# Patient Record
Sex: Female | Born: 1960
Health system: Southern US, Community
[De-identification: ages and names within clinical notes are randomized; demographics above are authoritative.]

## PROBLEM LIST (undated history)

## (undated) DIAGNOSIS — H8110 Benign paroxysmal vertigo, unspecified ear: Secondary | ICD-10-CM

## (undated) DIAGNOSIS — R0789 Other chest pain: Secondary | ICD-10-CM

## (undated) DIAGNOSIS — M7660 Achilles tendinitis, unspecified leg: Secondary | ICD-10-CM

## (undated) DIAGNOSIS — E785 Hyperlipidemia, unspecified: Secondary | ICD-10-CM

## (undated) DIAGNOSIS — Z0000 Encounter for general adult medical examination without abnormal findings: Principal | ICD-10-CM

## (undated) DIAGNOSIS — K219 Gastro-esophageal reflux disease without esophagitis: Secondary | ICD-10-CM

## (undated) DIAGNOSIS — M79671 Pain in right foot: Secondary | ICD-10-CM

## (undated) DIAGNOSIS — M79642 Pain in left hand: Secondary | ICD-10-CM

## (undated) DIAGNOSIS — B351 Tinea unguium: Principal | ICD-10-CM

## (undated) DIAGNOSIS — J019 Acute sinusitis, unspecified: Secondary | ICD-10-CM

## (undated) DIAGNOSIS — R3915 Urgency of urination: Secondary | ICD-10-CM

## (undated) DIAGNOSIS — M792 Neuralgia and neuritis, unspecified: Secondary | ICD-10-CM

## (undated) DIAGNOSIS — G540 Brachial plexus disorders: Secondary | ICD-10-CM

## (undated) DIAGNOSIS — I10 Essential (primary) hypertension: Secondary | ICD-10-CM

## (undated) HISTORY — DX: Other chest pain: R07.89

## (undated) HISTORY — DX: Encounter for general adult medical examination without abnormal findings: Z00.00

## (undated) HISTORY — DX: Essential (primary) hypertension: I10

## (undated) HISTORY — DX: Brachial plexus disorders: G54.0

## (undated) HISTORY — DX: Pain in right foot: M79.671

## (undated) HISTORY — PX: DILATION AND CURETTAGE OF UTERUS: SHX78

## (undated) HISTORY — PX: ABDOMINAL HYSTERECTOMY: SHX81

## (undated) HISTORY — DX: Pain in left hand: M79.642

## (undated) HISTORY — DX: Tinea unguium: B35.1

## (undated) HISTORY — DX: Neuralgia and neuritis, unspecified: M79.2

## (undated) HISTORY — DX: Achilles tendinitis, unspecified leg: M76.60

## (undated) HISTORY — DX: Hyperlipidemia, unspecified: E78.5

## (undated) HISTORY — DX: Acute sinusitis, unspecified: J01.90

## (undated) HISTORY — DX: Benign paroxysmal vertigo, unspecified ear: H81.10

## (undated) HISTORY — DX: Urgency of urination: R39.15

---

## 1998-02-05 ENCOUNTER — Other Ambulatory Visit: Admission: RE | Admit: 1998-02-05 | Discharge: 1998-02-05 | Payer: Self-pay | Admitting: Obstetrics

## 1998-02-05 ENCOUNTER — Encounter: Admission: RE | Admit: 1998-02-05 | Discharge: 1998-02-05 | Payer: Self-pay | Admitting: Obstetrics

## 1998-04-29 ENCOUNTER — Encounter: Admission: RE | Admit: 1998-04-29 | Discharge: 1998-04-29 | Payer: Self-pay | Admitting: Family Medicine

## 1998-05-13 ENCOUNTER — Encounter: Admission: RE | Admit: 1998-05-13 | Discharge: 1998-05-13 | Payer: Self-pay | Admitting: Family Medicine

## 1998-06-02 ENCOUNTER — Encounter: Admission: RE | Admit: 1998-06-02 | Discharge: 1998-06-02 | Payer: Self-pay | Admitting: Family Medicine

## 1998-06-05 ENCOUNTER — Encounter: Admission: RE | Admit: 1998-06-05 | Discharge: 1998-06-05 | Payer: Self-pay | Admitting: Family Medicine

## 1998-06-26 ENCOUNTER — Encounter: Admission: RE | Admit: 1998-06-26 | Discharge: 1998-06-26 | Payer: Self-pay | Admitting: Family Medicine

## 1998-07-13 ENCOUNTER — Encounter: Admission: RE | Admit: 1998-07-13 | Discharge: 1998-07-13 | Payer: Self-pay | Admitting: Family Medicine

## 1998-07-16 ENCOUNTER — Encounter: Admission: RE | Admit: 1998-07-16 | Discharge: 1998-07-16 | Payer: Self-pay | Admitting: Family Medicine

## 1998-07-20 ENCOUNTER — Encounter: Admission: RE | Admit: 1998-07-20 | Discharge: 1998-07-20 | Payer: Self-pay | Admitting: Sports Medicine

## 1999-02-09 ENCOUNTER — Encounter: Admission: RE | Admit: 1999-02-09 | Discharge: 1999-02-09 | Payer: Self-pay | Admitting: Sports Medicine

## 1999-02-19 ENCOUNTER — Encounter: Admission: RE | Admit: 1999-02-19 | Discharge: 1999-02-19 | Payer: Self-pay | Admitting: Family Medicine

## 1999-03-09 ENCOUNTER — Emergency Department (HOSPITAL_COMMUNITY): Admission: EM | Admit: 1999-03-09 | Discharge: 1999-03-09 | Payer: Self-pay | Admitting: Emergency Medicine

## 2000-10-13 ENCOUNTER — Encounter: Payer: Self-pay | Admitting: *Deleted

## 2000-10-13 ENCOUNTER — Emergency Department (HOSPITAL_COMMUNITY): Admission: EM | Admit: 2000-10-13 | Discharge: 2000-10-13 | Payer: Self-pay | Admitting: Emergency Medicine

## 2001-03-26 ENCOUNTER — Encounter: Admission: RE | Admit: 2001-03-26 | Discharge: 2001-03-26 | Payer: Self-pay | Admitting: Family Medicine

## 2001-09-05 ENCOUNTER — Encounter: Admission: RE | Admit: 2001-09-05 | Discharge: 2001-09-05 | Payer: Self-pay | Admitting: Family Medicine

## 2002-09-30 ENCOUNTER — Encounter: Admission: RE | Admit: 2002-09-30 | Discharge: 2002-09-30 | Payer: Self-pay | Admitting: Sports Medicine

## 2002-10-09 ENCOUNTER — Encounter: Admission: RE | Admit: 2002-10-09 | Discharge: 2002-10-09 | Payer: Self-pay | Admitting: Family Medicine

## 2003-01-07 ENCOUNTER — Encounter: Payer: Self-pay | Admitting: Occupational Medicine

## 2003-01-07 ENCOUNTER — Encounter: Admission: RE | Admit: 2003-01-07 | Discharge: 2003-01-07 | Payer: Self-pay | Admitting: Occupational Medicine

## 2003-11-18 ENCOUNTER — Encounter (INDEPENDENT_AMBULATORY_CARE_PROVIDER_SITE_OTHER): Payer: Self-pay | Admitting: *Deleted

## 2003-11-18 LAB — CONVERTED CEMR LAB

## 2003-11-27 ENCOUNTER — Encounter: Admission: RE | Admit: 2003-11-27 | Discharge: 2003-11-27 | Payer: Self-pay | Admitting: Family Medicine

## 2003-12-10 ENCOUNTER — Encounter: Admission: RE | Admit: 2003-12-10 | Discharge: 2003-12-10 | Payer: Self-pay | Admitting: Sports Medicine

## 2004-01-20 ENCOUNTER — Encounter: Admission: RE | Admit: 2004-01-20 | Discharge: 2004-01-20 | Payer: Self-pay | Admitting: Sports Medicine

## 2004-02-05 ENCOUNTER — Encounter: Admission: RE | Admit: 2004-02-05 | Discharge: 2004-02-05 | Payer: Self-pay | Admitting: Family Medicine

## 2004-02-19 ENCOUNTER — Encounter: Admission: RE | Admit: 2004-02-19 | Discharge: 2004-02-19 | Payer: Self-pay | Admitting: Family Medicine

## 2004-02-25 ENCOUNTER — Encounter: Admission: RE | Admit: 2004-02-25 | Discharge: 2004-02-25 | Payer: Self-pay | Admitting: Sports Medicine

## 2004-02-25 ENCOUNTER — Encounter: Admission: RE | Admit: 2004-02-25 | Discharge: 2004-02-25 | Payer: Self-pay | Admitting: Family Medicine

## 2004-03-26 ENCOUNTER — Encounter: Admission: RE | Admit: 2004-03-26 | Discharge: 2004-03-26 | Payer: Self-pay | Admitting: Family Medicine

## 2004-06-06 ENCOUNTER — Emergency Department (HOSPITAL_COMMUNITY): Admission: EM | Admit: 2004-06-06 | Discharge: 2004-06-06 | Payer: Self-pay | Admitting: Emergency Medicine

## 2004-07-01 ENCOUNTER — Encounter: Admission: RE | Admit: 2004-07-01 | Discharge: 2004-07-14 | Payer: Self-pay | Admitting: Occupational Medicine

## 2004-12-22 ENCOUNTER — Encounter: Admission: RE | Admit: 2004-12-22 | Discharge: 2004-12-22 | Payer: Self-pay | Admitting: Obstetrics and Gynecology

## 2005-02-04 ENCOUNTER — Encounter (INDEPENDENT_AMBULATORY_CARE_PROVIDER_SITE_OTHER): Payer: Self-pay | Admitting: Specialist

## 2005-02-04 ENCOUNTER — Ambulatory Visit (HOSPITAL_COMMUNITY): Admission: RE | Admit: 2005-02-04 | Discharge: 2005-02-04 | Payer: Self-pay | Admitting: Obstetrics and Gynecology

## 2006-06-19 ENCOUNTER — Emergency Department (HOSPITAL_COMMUNITY): Admission: EM | Admit: 2006-06-19 | Discharge: 2006-06-19 | Payer: Self-pay | Admitting: Family Medicine

## 2006-10-26 ENCOUNTER — Ambulatory Visit: Payer: Self-pay | Admitting: Cardiology

## 2006-10-26 ENCOUNTER — Inpatient Hospital Stay (HOSPITAL_COMMUNITY): Admission: EM | Admit: 2006-10-26 | Discharge: 2006-10-26 | Payer: Self-pay | Admitting: Family Medicine

## 2006-10-30 ENCOUNTER — Ambulatory Visit: Payer: Self-pay

## 2006-11-16 ENCOUNTER — Ambulatory Visit: Payer: Self-pay | Admitting: Family Medicine

## 2006-11-16 DIAGNOSIS — M79609 Pain in unspecified limb: Secondary | ICD-10-CM | POA: Insufficient documentation

## 2006-11-16 DIAGNOSIS — F17200 Nicotine dependence, unspecified, uncomplicated: Secondary | ICD-10-CM | POA: Insufficient documentation

## 2006-11-16 DIAGNOSIS — N938 Other specified abnormal uterine and vaginal bleeding: Secondary | ICD-10-CM | POA: Insufficient documentation

## 2006-11-16 DIAGNOSIS — E669 Obesity, unspecified: Secondary | ICD-10-CM | POA: Insufficient documentation

## 2006-11-16 DIAGNOSIS — N949 Unspecified condition associated with female genital organs and menstrual cycle: Secondary | ICD-10-CM

## 2006-11-16 DIAGNOSIS — R12 Heartburn: Secondary | ICD-10-CM | POA: Insufficient documentation

## 2006-11-17 ENCOUNTER — Encounter (INDEPENDENT_AMBULATORY_CARE_PROVIDER_SITE_OTHER): Payer: Self-pay | Admitting: *Deleted

## 2006-12-26 ENCOUNTER — Ambulatory Visit: Payer: Self-pay | Admitting: Internal Medicine

## 2006-12-29 ENCOUNTER — Ambulatory Visit: Payer: Self-pay | Admitting: Internal Medicine

## 2006-12-29 LAB — CONVERTED CEMR LAB
ALT: 19 units/L (ref 0–40)
AST: 19 units/L (ref 0–37)
Albumin: 3.4 g/dL — ABNORMAL LOW (ref 3.5–5.2)
Alkaline Phosphatase: 69 units/L (ref 39–117)
Bilirubin, Direct: 0.1 mg/dL (ref 0.0–0.3)
Cholesterol: 111 mg/dL (ref 0–200)
HDL: 31.3 mg/dL — ABNORMAL LOW (ref >39.0)
LDL Cholesterol: 63 mg/dL (ref 0–99)
Sed Rate: 20 mm/hr (ref 0–25)
Total Bilirubin: 0.7 mg/dL (ref 0.3–1.2)
Total CHOL/HDL Ratio: 3.5
Total Protein: 6.7 g/dL (ref 6.0–8.3)
Triglycerides: 85 mg/dL (ref 0–149)
VLDL: 17 mg/dL (ref 0–40)

## 2007-01-30 ENCOUNTER — Ambulatory Visit: Payer: Self-pay | Admitting: Internal Medicine

## 2007-02-01 ENCOUNTER — Ambulatory Visit (HOSPITAL_BASED_OUTPATIENT_CLINIC_OR_DEPARTMENT_OTHER): Admission: RE | Admit: 2007-02-01 | Discharge: 2007-02-01 | Payer: Self-pay | Admitting: Internal Medicine

## 2007-02-06 ENCOUNTER — Ambulatory Visit: Payer: Self-pay | Admitting: Pulmonary Disease

## 2007-02-09 ENCOUNTER — Ambulatory Visit (HOSPITAL_COMMUNITY): Admission: RE | Admit: 2007-02-09 | Discharge: 2007-02-09 | Payer: Self-pay | Admitting: Internal Medicine

## 2007-03-12 ENCOUNTER — Ambulatory Visit: Payer: Self-pay | Admitting: Internal Medicine

## 2007-06-12 ENCOUNTER — Ambulatory Visit: Payer: Self-pay | Admitting: Internal Medicine

## 2008-01-30 ENCOUNTER — Ambulatory Visit: Payer: Self-pay | Admitting: Internal Medicine

## 2008-01-30 DIAGNOSIS — G44209 Tension-type headache, unspecified, not intractable: Secondary | ICD-10-CM | POA: Insufficient documentation

## 2008-04-22 LAB — CONVERTED CEMR LAB: Pap Smear: NORMAL

## 2008-05-08 ENCOUNTER — Ambulatory Visit: Payer: Self-pay | Admitting: Internal Medicine

## 2008-05-08 DIAGNOSIS — I10 Essential (primary) hypertension: Secondary | ICD-10-CM | POA: Insufficient documentation

## 2008-05-08 DIAGNOSIS — R609 Edema, unspecified: Secondary | ICD-10-CM | POA: Insufficient documentation

## 2008-05-27 ENCOUNTER — Ambulatory Visit: Payer: Self-pay | Admitting: Internal Medicine

## 2008-05-27 LAB — CONVERTED CEMR LAB
ALT: 17 units/L (ref 0–35)
AST: 18 units/L (ref 0–37)
BUN: 12 mg/dL (ref 6–23)
CO2: 31 meq/L (ref 19–32)
Calcium: 8.5 mg/dL (ref 8.4–10.5)
Chloride: 104 meq/L (ref 96–112)
Cholesterol: 135 mg/dL (ref 0–200)
Creatinine, Ser: 0.7 mg/dL (ref 0.4–1.2)
GFR calc Af Amer: 116 mL/min
GFR calc non Af Amer: 96 mL/min
Glucose, Bld: 132 mg/dL — ABNORMAL HIGH (ref 70–99)
HDL: 26 mg/dL — ABNORMAL LOW (ref >39.0)
Hgb A1c MFr Bld: 6.5 % — ABNORMAL HIGH (ref 4.6–6.0)
LDL Cholesterol: 91 mg/dL (ref 0–99)
Potassium: 3.9 meq/L (ref 3.5–5.1)
Sodium: 141 meq/L (ref 135–145)
TSH: 2.58 microintl units/mL (ref 0.35–5.50)
Total CHOL/HDL Ratio: 5.2
Triglycerides: 88 mg/dL (ref 0–149)
VLDL: 18 mg/dL (ref 0–40)

## 2008-06-19 ENCOUNTER — Ambulatory Visit: Payer: Self-pay | Admitting: Internal Medicine

## 2008-06-19 DIAGNOSIS — E1169 Type 2 diabetes mellitus with other specified complication: Secondary | ICD-10-CM | POA: Insufficient documentation

## 2008-06-19 DIAGNOSIS — E669 Obesity, unspecified: Secondary | ICD-10-CM | POA: Insufficient documentation

## 2008-07-09 ENCOUNTER — Inpatient Hospital Stay (HOSPITAL_COMMUNITY): Admission: RE | Admit: 2008-07-09 | Discharge: 2008-07-11 | Payer: Self-pay | Admitting: Obstetrics and Gynecology

## 2008-07-09 ENCOUNTER — Encounter (INDEPENDENT_AMBULATORY_CARE_PROVIDER_SITE_OTHER): Payer: Self-pay | Admitting: Obstetrics and Gynecology

## 2008-07-11 ENCOUNTER — Encounter: Payer: Self-pay | Admitting: Internal Medicine

## 2008-07-14 ENCOUNTER — Inpatient Hospital Stay (HOSPITAL_COMMUNITY): Admission: AD | Admit: 2008-07-14 | Discharge: 2008-07-14 | Payer: Self-pay | Admitting: Obstetrics and Gynecology

## 2008-07-15 ENCOUNTER — Ambulatory Visit (HOSPITAL_COMMUNITY): Admission: RE | Admit: 2008-07-15 | Discharge: 2008-07-15 | Payer: Self-pay | Admitting: Obstetrics and Gynecology

## 2008-09-24 ENCOUNTER — Encounter: Payer: Self-pay | Admitting: Internal Medicine

## 2008-11-06 ENCOUNTER — Encounter: Payer: Self-pay | Admitting: Internal Medicine

## 2008-11-06 LAB — HM DIABETES EYE EXAM: HM Diabetic Eye Exam: NORMAL

## 2008-11-20 ENCOUNTER — Ambulatory Visit (HOSPITAL_BASED_OUTPATIENT_CLINIC_OR_DEPARTMENT_OTHER): Admission: RE | Admit: 2008-11-20 | Discharge: 2008-11-20 | Payer: Self-pay | Admitting: Internal Medicine

## 2008-11-20 ENCOUNTER — Ambulatory Visit: Payer: Self-pay | Admitting: Internal Medicine

## 2008-11-20 ENCOUNTER — Ambulatory Visit: Payer: Self-pay | Admitting: Diagnostic Radiology

## 2008-11-20 DIAGNOSIS — R0789 Other chest pain: Secondary | ICD-10-CM | POA: Insufficient documentation

## 2008-12-15 ENCOUNTER — Ambulatory Visit: Payer: Self-pay | Admitting: Internal Medicine

## 2008-12-15 LAB — CONVERTED CEMR LAB
ALT: 18 units/L (ref 0–35)
AST: 18 units/L (ref 0–37)
Albumin: 3.6 g/dL (ref 3.5–5.2)
Alkaline Phosphatase: 75 units/L (ref 39–117)
BUN: 15 mg/dL (ref 6–23)
Bilirubin, Direct: 0.1 mg/dL (ref 0.0–0.3)
CO2: 31 meq/L (ref 19–32)
Calcium: 9.1 mg/dL (ref 8.4–10.5)
Chloride: 107 meq/L (ref 96–112)
Cholesterol: 142 mg/dL (ref 0–200)
Creatinine, Ser: 0.7 mg/dL (ref 0.4–1.2)
Creatinine,U: 115.9 mg/dL
GFR calc non Af Amer: 115.79 mL/min (ref >60)
Glucose, Bld: 130 mg/dL — ABNORMAL HIGH (ref 70–99)
HDL: 30.6 mg/dL — ABNORMAL LOW (ref >39.00)
Hgb A1c MFr Bld: 6.7 % — ABNORMAL HIGH (ref 4.6–6.5)
LDL Cholesterol: 93 mg/dL (ref 0–99)
Microalb Creat Ratio: 2.6 mg/g (ref 0.0–30.0)
Microalb, Ur: 0.3 mg/dL (ref 0.0–1.9)
Potassium: 4.5 meq/L (ref 3.5–5.1)
Sodium: 142 meq/L (ref 135–145)
Total Bilirubin: 0.5 mg/dL (ref 0.3–1.2)
Total CHOL/HDL Ratio: 5
Total Protein: 7.2 g/dL (ref 6.0–8.3)
Triglycerides: 94 mg/dL (ref 0.0–149.0)
VLDL: 18.8 mg/dL (ref 0.0–40.0)

## 2008-12-22 ENCOUNTER — Ambulatory Visit: Payer: Self-pay | Admitting: Internal Medicine

## 2008-12-23 DIAGNOSIS — E782 Mixed hyperlipidemia: Secondary | ICD-10-CM | POA: Insufficient documentation

## 2008-12-24 ENCOUNTER — Encounter: Payer: Self-pay | Admitting: Cardiology

## 2008-12-24 ENCOUNTER — Ambulatory Visit: Payer: Self-pay | Admitting: Cardiology

## 2009-02-20 ENCOUNTER — Encounter: Payer: Self-pay | Admitting: Cardiology

## 2009-02-20 ENCOUNTER — Ambulatory Visit: Payer: Self-pay

## 2009-03-19 ENCOUNTER — Ambulatory Visit: Payer: Self-pay | Admitting: Internal Medicine

## 2009-03-19 LAB — CONVERTED CEMR LAB
BUN: 12 mg/dL (ref 6–23)
CO2: 31 meq/L (ref 19–32)
Calcium: 9 mg/dL (ref 8.4–10.5)
Chloride: 105 meq/L (ref 96–112)
Creatinine, Ser: 0.7 mg/dL (ref 0.4–1.2)
GFR calc non Af Amer: 115.66 mL/min (ref >60)
Glucose, Bld: 107 mg/dL — ABNORMAL HIGH (ref 70–99)
Hgb A1c MFr Bld: 6.3 % (ref 4.6–6.5)
Potassium: 4.3 meq/L (ref 3.5–5.1)
Sodium: 140 meq/L (ref 135–145)

## 2009-03-26 ENCOUNTER — Ambulatory Visit: Payer: Self-pay | Admitting: Internal Medicine

## 2009-05-26 ENCOUNTER — Ambulatory Visit: Payer: Self-pay | Admitting: Internal Medicine

## 2009-05-26 LAB — CONVERTED CEMR LAB
BUN: 13 mg/dL (ref 6–23)
CO2: 29 meq/L (ref 19–32)
Calcium: 8.7 mg/dL (ref 8.4–10.5)
Chloride: 107 meq/L (ref 96–112)
Creatinine, Ser: 0.8 mg/dL (ref 0.4–1.2)
GFR calc non Af Amer: 99.06 mL/min (ref >60)
Glucose, Bld: 115 mg/dL — ABNORMAL HIGH (ref 70–99)
Hgb A1c MFr Bld: 6.6 % — ABNORMAL HIGH (ref 4.6–6.5)
Potassium: 4.1 meq/L (ref 3.5–5.1)
Sodium: 140 meq/L (ref 135–145)

## 2009-06-04 ENCOUNTER — Ambulatory Visit: Payer: Self-pay | Admitting: Internal Medicine

## 2009-06-22 ENCOUNTER — Telehealth: Payer: Self-pay | Admitting: Internal Medicine

## 2009-08-04 ENCOUNTER — Telehealth: Payer: Self-pay | Admitting: Internal Medicine

## 2009-09-28 ENCOUNTER — Ambulatory Visit: Payer: Self-pay | Admitting: Internal Medicine

## 2009-09-28 LAB — CONVERTED CEMR LAB
BUN: 10 mg/dL (ref 6–23)
CO2: 30 meq/L (ref 19–32)
Calcium: 8.8 mg/dL (ref 8.4–10.5)
Chloride: 105 meq/L (ref 96–112)
Creatinine, Ser: 0.8 mg/dL (ref 0.4–1.2)
GFR calc non Af Amer: 98.92 mL/min (ref >60)
Glucose, Bld: 96 mg/dL (ref 70–99)
Hgb A1c MFr Bld: 6.3 % (ref 4.6–6.5)
Potassium: 4.2 meq/L (ref 3.5–5.1)
Sodium: 141 meq/L (ref 135–145)

## 2009-10-22 ENCOUNTER — Telehealth: Payer: Self-pay | Admitting: Internal Medicine

## 2010-01-14 ENCOUNTER — Ambulatory Visit: Payer: Self-pay | Admitting: Internal Medicine

## 2010-01-14 LAB — CONVERTED CEMR LAB
BUN: 13 mg/dL (ref 6–23)
CO2: 29 meq/L (ref 19–32)
Calcium: 9.2 mg/dL (ref 8.4–10.5)
Chloride: 104 meq/L (ref 96–112)
Creatinine, Ser: 0.87 mg/dL (ref 0.40–1.20)
Creatinine, Urine: 62.6 mg/dL
Glucose, Bld: 100 mg/dL — ABNORMAL HIGH (ref 70–99)
Hgb A1c MFr Bld: 6.3 % — ABNORMAL HIGH (ref <5.7)
Microalb Creat Ratio: 8 mg/g (ref 0.0–30.0)
Microalb, Ur: 0.5 mg/dL (ref 0.00–1.89)
Potassium: 4.5 meq/L (ref 3.5–5.3)
Sodium: 140 meq/L (ref 135–145)

## 2010-01-15 ENCOUNTER — Encounter: Payer: Self-pay | Admitting: Internal Medicine

## 2010-01-22 ENCOUNTER — Telehealth: Payer: Self-pay | Admitting: Internal Medicine

## 2010-02-23 LAB — HM MAMMOGRAPHY: HM Mammogram: NORMAL

## 2010-03-10 ENCOUNTER — Ambulatory Visit: Payer: Self-pay | Admitting: Internal Medicine

## 2010-03-10 DIAGNOSIS — R252 Cramp and spasm: Secondary | ICD-10-CM | POA: Insufficient documentation

## 2010-03-10 LAB — HM DIABETES FOOT EXAM

## 2010-03-26 ENCOUNTER — Telehealth: Payer: Self-pay | Admitting: Internal Medicine

## 2010-06-02 ENCOUNTER — Ambulatory Visit: Payer: Self-pay | Admitting: Internal Medicine

## 2010-06-02 LAB — CONVERTED CEMR LAB
BUN: 16 mg/dL (ref 6–23)
CO2: 28 meq/L (ref 19–32)
Calcium: 9.6 mg/dL (ref 8.4–10.5)
Chloride: 108 meq/L (ref 96–112)
Creatinine, Ser: 0.8 mg/dL (ref 0.4–1.2)
GFR calc non Af Amer: 93.23 mL/min (ref >60)
Glucose, Bld: 142 mg/dL — ABNORMAL HIGH (ref 70–99)
Hgb A1c MFr Bld: 8.1 % — ABNORMAL HIGH (ref 4.6–6.5)
Potassium: 4.6 meq/L (ref 3.5–5.1)
Sodium: 142 meq/L (ref 135–145)
Total CK: 232 units/L — ABNORMAL HIGH (ref 7–177)

## 2010-06-09 ENCOUNTER — Ambulatory Visit: Payer: Self-pay | Admitting: Internal Medicine

## 2010-07-05 ENCOUNTER — Encounter: Payer: Self-pay | Admitting: Internal Medicine

## 2010-07-07 ENCOUNTER — Telehealth: Payer: Self-pay | Admitting: Internal Medicine

## 2010-07-09 ENCOUNTER — Emergency Department (HOSPITAL_COMMUNITY): Admission: EM | Admit: 2010-07-09 | Discharge: 2010-07-09 | Payer: Self-pay | Admitting: Family Medicine

## 2010-07-14 ENCOUNTER — Encounter: Payer: Self-pay | Admitting: Internal Medicine

## 2010-07-15 ENCOUNTER — Telehealth: Payer: Self-pay | Admitting: Internal Medicine

## 2010-07-23 ENCOUNTER — Encounter: Payer: Self-pay | Admitting: Internal Medicine

## 2010-09-01 ENCOUNTER — Ambulatory Visit: Payer: Self-pay | Admitting: Internal Medicine

## 2010-09-06 ENCOUNTER — Encounter: Payer: Self-pay | Admitting: Internal Medicine

## 2010-09-21 ENCOUNTER — Encounter: Payer: Self-pay | Admitting: Internal Medicine

## 2010-09-21 ENCOUNTER — Ambulatory Visit (HOSPITAL_BASED_OUTPATIENT_CLINIC_OR_DEPARTMENT_OTHER)
Admission: RE | Admit: 2010-09-21 | Discharge: 2010-09-21 | Payer: Self-pay | Source: Home / Self Care | Attending: Internal Medicine | Admitting: Internal Medicine

## 2010-09-21 DIAGNOSIS — R062 Wheezing: Secondary | ICD-10-CM | POA: Insufficient documentation

## 2010-09-21 DIAGNOSIS — R0989 Other specified symptoms and signs involving the circulatory and respiratory systems: Secondary | ICD-10-CM | POA: Insufficient documentation

## 2010-09-21 DIAGNOSIS — R0609 Other forms of dyspnea: Secondary | ICD-10-CM | POA: Insufficient documentation

## 2010-09-21 LAB — CONVERTED CEMR LAB
BUN: 12 mg/dL (ref 6–23)
CO2: 22 meq/L (ref 19–32)
Calcium: 9.3 mg/dL (ref 8.4–10.5)
Chloride: 105 meq/L (ref 96–112)
Creatinine, Ser: 0.74 mg/dL (ref 0.40–1.20)
Glucose, Bld: 112 mg/dL — ABNORMAL HIGH (ref 70–99)
HCT: 43 % (ref 36.0–46.0)
Hemoglobin: 14.2 g/dL (ref 12.0–15.0)
Hgb A1c MFr Bld: 7 % — ABNORMAL HIGH (ref <5.7)
MCHC: 33 g/dL (ref 30.0–36.0)
MCV: 91.3 fL (ref 78.0–100.0)
Platelets: 391 10*3/uL (ref 150–400)
Potassium: 4.6 meq/L (ref 3.5–5.3)
RBC: 4.71 M/uL (ref 3.87–5.11)
RDW: 14 % (ref 11.5–15.5)
Sodium: 142 meq/L (ref 135–145)
WBC: 11.2 10*3/uL — ABNORMAL HIGH (ref 4.0–10.5)

## 2010-09-22 ENCOUNTER — Telehealth: Payer: Self-pay | Admitting: Internal Medicine

## 2010-10-19 NOTE — Assessment & Plan Note (Signed)
Summary: 1 month follow-up//fd   Vital Signs:  Patient profile:   50 year old female Weight:      237.50 pounds BMI:     46.55 Temp:     98.2 degrees F oral Pulse rate:   68 / minute Pulse rhythm:   regular Resp:     16 per minute BP sitting:   116 / 72  (right arm) Cuff size:   large  Vitals Entered By: Glendell Docker CMA (December 22, 2008 1:20 PM)  Primary Care Provider:  Dondra Spry DO  CC:  Type 2 diabetes mellitus follow-up.  History of Present Illness: follow up disease management  Type 2 Diabetes Mellitus Follow-Up      This is a 50 year old woman who presents for Type 2 diabetes mellitus follow-up.  The patient denies polyuria, polydipsia, weight loss, and weight gain.  The patient denies the following symptoms: chest pain.  Since the last visit the patient reports good dietary compliance, compliance with medications, and not exercising regularly.  Since the last visit, the patient reports having had eye care by an ophthalmologist.    Htn - stable.  Tobacco abuse - she quit smoking.  Preventive Screening-Counseling & Management     Smoking Status: quit < 6 months  Allergies: No Known Drug Allergies  Past History:  Past Medical History:    Depression    Headaches - neg MRI of Brain 01/2007    Mild sleep apnea      DM II    Hypertension   Family History:    both have GERD, Mother has HTN, father has `heart problems`. No h/o cancer. 2 brothers and 1 sister A&W.     Social History:    Pt works at [employer] in [place] [employer].      No etoh, no drugs.    Quit smoking     Does have h/o marijuana use in past.     No regular exercise. G0P0       Smoking Status:  quit < 6 months  Review of Systems  The patient denies weight loss, weight gain, and chest pain.    Physical Exam  General:  alert, well-developed, and well-nourished.   Neck:  supple, no masses, and no carotid bruits.   Lungs:  normal respiratory effort and normal breath sounds.   Heart:   normal rate, regular rhythm, and no gallop.   Extremities:  trace left pedal edema and 1+ right pedal edema.   Neurologic:  cranial nerves II-XII intact and gait normal.    Diabetes Management Exam:    Foot Exam (with socks and/or shoes not present):       Inspection:          Left foot: normal          Right foot: normal    Eye Exam:       Eye Exam done elsewhere          Date: 11/06/2008          Results: normal          Done by: Dr. Hanley Seamen   Impression & Recommendations:  Problem # 1:  DIABETES MELLITUS, TYPE II (ICD-250.00) A1c worse.  Add Byetta.   Her updated medication list for this problem includes:    Metformin Hcl 750 Mg Xr24h-tab (Metformin hcl) ..... One by mouth bid    Aspirin 81 Mg Tabs (Aspirin) .Marland Kitchen... Take 1 tab once daily    Byetta  5 Mcg Pen 5 Mcg/0.103ml Soln (Exenatide) .Marland KitchenMarland KitchenMarland KitchenMarland Kitchen 5 micrograms subcutaneously two times a day  Labs Reviewed: Creat: 0.7 (12/15/2008)    Reviewed HgBA1c results: 6.7 (12/15/2008)  6.5 (05/27/2008)  Problem # 2:  HYPERTENSION (ICD-401.9) Stable.  Maintain current medication regimen.  Her updated medication list for this problem includes:    Metoprolol Succinate 50 Mg Xr24h-tab (Metoprolol succinate) ..... One by mouth once daily    Hydrochlorothiazide 25 Mg Tabs (Hydrochlorothiazide) ..... One by mouth once daily  BP today: 116/72 Prior BP: 130/86 (11/20/2008)  Labs Reviewed: K+: 4.5 (12/15/2008) Creat: : 0.7 (12/15/2008)   Chol: 142 (12/15/2008)   HDL: 30.60 (12/15/2008)   LDL: 93 (12/15/2008)   TG: 94.0 (12/15/2008)  Problem # 3:  TOBACCO DEPENDENCE (ICD-305.1) Assessment: Improved Pt reports quiting smoking.  Her updated medication list for this problem includes:    Eq Nicotine 14 Mg/24hr Pt24 (Nicotine) .Marland Kitchen... Apply qd    Eq Nicotine 7 Mg/24hr Pt24 (Nicotine) .Marland Kitchen... Apply qd    Commit 2 Mg Lozg (Nicotine polacrilex) ..... Use qid as needed  Complete Medication List: 1)  Metoprolol Succinate 50 Mg Xr24h-tab (Metoprolol  succinate) .... One by mouth once daily 2)  Simvastatin 40 Mg Tabs (Simvastatin) .... Take 1 tablet by mouth once a day 3)  Hydrochlorothiazide 25 Mg Tabs (Hydrochlorothiazide) .... One by mouth once daily 4)  Omeprazole 20 Mg Tbec (Omeprazole) .... Take 1 tablet by mouth two times a day 5)  Metformin Hcl 750 Mg Xr24h-tab (Metformin hcl) .... One by mouth bid 6)  Aspirin 81 Mg Tabs (Aspirin) .... Take 1 tab once daily 7)  Eq Nicotine 14 Mg/24hr Pt24 (Nicotine) .... Apply qd 8)  Eq Nicotine 7 Mg/24hr Pt24 (Nicotine) .... Apply qd 9)  Commit 2 Mg Lozg (Nicotine polacrilex) .... Use qid as needed 10)  Byetta 5 Mcg Pen 5 Mcg/0.74ml Soln (Exenatide) .... 5 micrograms subcutaneously two times a day 11)  Bd U/f Short Pen Needle 31g X 8 Mm Misc (Insulin pen needle) .... Use two times a day as directed  Patient Instructions: 1)  Please schedule a follow-up appointment in 3 months. 2)  BMP prior to visit, ICD-9: 250.00 3)  HbgA1C prior to visit, ICD-9: 250.00 4)  Please return for lab work one (1) week before your next appointment.  Prescriptions: BD U/F SHORT PEN NEEDLE 31G X 8 MM MISC (INSULIN PEN NEEDLE) use two times a day as directed  #100 x 3   Entered and Authorized by:   D. Thomos Lemons DO   Signed by:   D. Thomos Lemons DO on 12/22/2008   Method used:   Electronically to        [facility] (retail)       [address]       [place], [place]  [address]       Ph: [phone]       Fax: [phone]   RxID:   [id number] BYETTA 5 MCG PEN 5 MCG/0.02ML SOLN (EXENATIDE) 5 micrograms Subcutaneously two times a day  #1 month x 2   Entered and Authorized by:   D. Thomos Lemons DO   Signed by:   D. Thomos Lemons DO on 12/22/2008   Method used:   Electronically to        [facility] (retail)       [address]       [place]  [place], [place]  [address]       Ph: [phone]       Fax: [phone]   RxID:   [id number]

## 2010-10-19 NOTE — Letter (Signed)
   [place] at [facility] [address] [place], [place]  [address]  [place] Phone: [phone]      January 15, 2010   [facility] [address] [place], [place] [address]  RE:  LAB RESULTS  Dear  Ms. [place],  The following is an interpretation of your most recent lab tests.  Please take note of any instructions provided or changes to medications that have resulted from your lab work.  ELECTROLYTES:  Good - no changes needed  KIDNEY FUNCTION TESTS:  Good - no changes needed    DIABETIC STUDIES:  Good - no changes needed Blood Glucose: 100   HgbA1C: 6.3   Microalbumin/Creatinine Ratio: 8.0          Sincerely Yours,    Dr. Thomos Lemons

## 2010-10-19 NOTE — Assessment & Plan Note (Signed)
Summary: 2 month follow up/mhf   Vital Signs:  Patient profile:   50 year old female Weight:      243.25 pounds BMI:     41.90 O2 Sat:      98 % on Room air Temp:     97.9 degrees F oral Pulse rate:   72 / minute Pulse rhythm:   regular Resp:     16 per minute BP sitting:   110 / 70  (right arm) Cuff size:   large  Vitals Entered By: Glendell Docker CMA (March 10, 2010 9:59 AM)  O2 Flow:  Room air CC: Rm 2- 2 Month Follow up, Type 2 diabetes mellitus follow-up Is Patient Diabetic? Yes Did you bring your meter with you today? No Comments c/o decrease in energy, cramping all over, low blood sugar 122 high 150 avg unknown, medications reviewed,    Primary Care Provider:  DThomos Lemons DO  CC:  Rm 2- 2 Month Follow up and Type 2 diabetes mellitus follow-up.  History of Present Illness:  Type 2 Diabetes Mellitus Follow-Up      This is a 50 year old woman who presents for Type 2 diabetes mellitus follow-up.  The patient reports weight gain.  The patient denies the following symptoms: chest pain.  Since the last visit the patient reports exercising regularly and monitoring blood glucose.  wt gain since she stopped smoking  Htn - some exercise intolerance but no chest pain  c/o intermittent cramping in her legs. non exertional   Preventive Screening-Counseling & Management  Alcohol-Tobacco     Smoking Status: quit < 6 months  Allergies (verified): No Known Drug Allergies  Past History:  Past Medical History: Depression Headaches - neg MRI of Brain 01/2007 Mild sleep apnea    DM II  Hypertension   hyperlipidemia  Atypical chest pain - normal stress echo 02/2009   LV size was normal. - LV global systolic function was normal. - Normal wall motion; no LV regional wall motion abnormalities.  Past Surgical History: D and C Hysterectomy  Lysis of adhesions     Social History: Smoking Status:  quit < 6 months  Physical Exam  General:  alert, well-developed, and  well-nourished.   Lungs:  normal respiratory effort and normal breath sounds.   Heart:  normal rate, regular rhythm, and no gallop.   Pulses:  dorsalis pedis and posterior tibial pulses are full and equal bilaterally Extremities:  1+ left pedal edema and 1+ right pedal edema.   Neurologic:  cranial nerves II-XII intact and gait normal.   Psych:  normally interactive, good eye contact, not anxious appearing, and not depressed appearing.    Diabetes Management Exam:    Foot Exam (with socks and/or shoes not present):       Inspection:          Left foot: normal          Right foot: normal   Impression & Recommendations:  Problem # 1:  TOBACCO DEPENDENCE (ICD-305.1) Assessment Improved Pt quit smoking 2 months ago.  take chantix for one more month.  8 lbs wt gain since she stopped.  she started exercise program  The following medications were removed from the medication list:    Chantix Starting Month Pak 0.5 Mg X 11 & 1 Mg X 42 Tabs (Varenicline tartrate) ..... Use as directed    Chantix Continuing Month Pak 1 Mg Tabs (Varenicline tartrate) ..... Use as directed Her updated medication list for  this problem includes:    Chantix 1 Mg Tabs (Varenicline tartrate) ..... One by mouth two times a day  Problem # 2:  CRAMP IN LIMB (ICD-729.82) She gets occ muscle cramps.  reduce simvastatin dose.  try CoQ10 over the counter  Problem # 3:  DIABETES MELLITUS, TYPE II (ICD-250.00) Byetta stopped due to cost reasons.  If A1c worse and wt gain worse, consider switch Januvia to Victoza  Her updated medication list for this problem includes:    Metformin Hcl 750 Mg Xr24h-tab (Metformin hcl) ..... One by mouth bid    Aspirin 81 Mg Tabs (Aspirin) .Marland Kitchen... Take 1 tab once daily    Januvia 100 Mg Tabs (Sitagliptin phosphate) ..... One by mouth qd  Complete Medication List: 1)  Simvastatin 10 Mg Tabs (Simvastatin) .... One by mouth once daily 2)  Hydrochlorothiazide 25 Mg Tabs (Hydrochlorothiazide)  .... One by mouth once daily 3)  Omeprazole 20 Mg Tbec (Omeprazole) .... Take 1 tablet by mouth two times a day 4)  Metformin Hcl 750 Mg Xr24h-tab (Metformin hcl) .... One by mouth bid 5)  Aspirin 81 Mg Tabs (Aspirin) .... Take 1 tab once daily 6)  Januvia 100 Mg Tabs (Sitagliptin phosphate) .... One by mouth qd 7)  Bd U/f Short Pen Needle 31g X 8 Mm Misc (Insulin pen needle) .... Use two times a day as directed 8)  Onetouch Ultra Test Strp (Glucose blood) .... Use to test blood sugars once daily  ( diagnosis code 250.00) 9)  Bystolic 5 Mg Tabs (Nebivolol hcl) .... One by mouth once daily 10)  Chantix 1 Mg Tabs (Varenicline tartrate) .... One by mouth two times a day  Patient Instructions: 1)  Take CoQ 100-200 mg once daily over the counter 2)  Please schedule a follow-up appointment in 3 months. 3)  BMP prior to visit, ICD-9:  401.9 4)  HbgA1C prior to visit, ICD-9:  790.29 5)  CPK:  729.92 6)  Please return for lab work one (1) week before your next appointment.  Prescriptions: CHANTIX 1 MG TABS (VARENICLINE TARTRATE) one by mouth two times a day  #60 x 0   Entered and Authorized by:   D. Thomos Lemons DO   Signed by:   D. Thomos Lemons DO on 03/10/2010   Method used:   Electronically to        [facility] (retail)       [address]       [place], [place]  [address]       Ph: [phone]       Fax: [phone]   RxID:   [id number] SIMVASTATIN 10 MG TABS (SIMVASTATIN) one by mouth once daily  #30 x 3   Entered and Authorized by:   D. Thomos Lemons DO   Signed by:   D. Thomos Lemons DO on 03/10/2010   Method used:   Electronically to        [facility] (retail)       [address]       [place], [place]  [address]       Ph: [phone]       Fax: [phone]   RxID:   [id number]    Preventive Care Screening  Mammogram:    Date:  02/23/2010    Results:  normal    Current Allergies (reviewed today): No  known allergies

## 2010-10-19 NOTE — Progress Notes (Signed)
Summary: Diabetic Testing Supplies/[facility]  Phone Note From Other Clinic Call back at [phone]   Caller: Marylu Lund Hauser-RN CCM- [facility] Case Manager Summary of Call: Request for last office note, most recent blood work to be faxed to [facility] at [phone] , and rx's for a True Trak Glucose Meter, Test Strips, and Lancets to be faxed to  [facility] at [phone].  Patient has signed a release for the requested information Initial call taken by: Glendell Docker CMA,  July 07, 2010 12:24 PM  Follow-up for Phone Call        approval to send from Dr Artist Pais, information faxed and rx's have been sent Follow-up by: Glendell Docker CMA,  July 07, 2010 2:45 PM    New/Updated Medications: TRUETRACK BLOOD GLUCOSE W/DEVICE KIT (BLOOD GLUCOSE MONITORING SUPPL) use to test blood sugar (250.00) TRUETRACK TEST  STRP (GLUCOSE BLOOD) use to test blood sugar three times a day (250.00) LANCET DEVICE  MISC (LANCET DEVICES) use to test blood sugar three times a day (250.00) Prescriptions: LANCET DEVICE  MISC (LANCET DEVICES) use to test blood sugar three times a day (250.00)  #100 x 3   Entered by:   Glendell Docker CMA   Authorized by:   D. Thomos Lemons DO   Signed by:   Glendell Docker CMA on 07/07/2010   Method used:   Electronically to        [facility]* (retail)       [address]       [address] [place] [address]       [place], [place]  [address]       Ph: [phone]       Fax: [phone]   RxID:   [id number] TRUETRACK TEST  STRP (GLUCOSE BLOOD) use to test blood sugar three times a day (250.00)  #100 x 3   Entered by:   Glendell Docker CMA   Authorized by:   D. Thomos Lemons DO   Signed by:   Glendell Docker CMA on 07/07/2010   Method used:   Electronically to        [facility]* (retail)       [address]       [address] [place] [address]       [place], [place]  [address]       Ph: [phone]       Fax: [phone]   RxID:    [id number] TRUETRACK BLOOD GLUCOSE W/DEVICE KIT (BLOOD GLUCOSE MONITORING SUPPL) use to test blood sugar (250.00)  #1 x 0   Entered by:   Glendell Docker CMA   Authorized by:   D. Thomos Lemons DO   Signed by:   Glendell Docker CMA on 07/07/2010   Method used:   Electronically to        [facility]* (retail)       [address]       [address] [place] [address]       [place], [place]  [address]       Ph: [phone]       Fax: [phone]   RxID:   [id number]

## 2010-10-19 NOTE — Progress Notes (Signed)
Summary: HCTZ Refill  Phone Note Refill Request Message from:  Fax from Pharmacy on August 04, 2009 10:19 AM  Refills Requested: Medication #1:  HYDROCHLOROTHIAZIDE 25 MG  TABS one by mouth once daily   Dosage confirmed as above?Dosage Confirmed   Brand Name Necessary? No   Supply Requested: 1 month   Last Refilled: 06/07/2009  Method Requested: Electronic Next Appointment Scheduled: none Initial call taken by: Roselle Locus,  August 04, 2009 10:20 AM  Follow-up for Phone Call        Rx sent electronically to pharmacy Follow-up by: Glendell Docker CMA,  August 04, 2009 11:30 AM    Prescriptions: HYDROCHLOROTHIAZIDE 25 MG  TABS (HYDROCHLOROTHIAZIDE) one by mouth once daily  #30 x 4   Entered by:   Glendell Docker CMA   Authorized by:   D. Thomos Lemons DO   Signed by:   Glendell Docker CMA on 08/04/2009   Method used:   Electronically to        [facility] (retail)       [address]       [place], [place]  [address]       Ph: [phone]       Fax: [phone]   RxID:   [id number]

## 2010-10-19 NOTE — Progress Notes (Signed)
Summary: Bystolic Refill  Phone Note Call from Patient Call back at Home Phone [phone]   Caller: Patient Call For: D. Thomos Lemons DO Reason for Call: Refill Medication Summary of Call: patient called stating she was given samples of Bystolic on her last office visit with Dr Artist Pais. She states she has reun out of the samples and is needing a rx sent to [facility]. She was informed rx would be sent to pharmacy. Initial call taken by: Glendell Docker CMA,  March 26, 2010 10:41 AM    Prescriptions: BYSTOLIC 5 MG TABS (NEBIVOLOL HCL) one by mouth once daily  #30 x 2   Entered by:   Glendell Docker CMA   Authorized by:   D. Thomos Lemons DO   Signed by:   Glendell Docker CMA on 03/26/2010   Method used:   Electronically to        [facility] (retail)       [address]       [place], [place]  [address]       Ph: [phone]       Fax: [phone]   RxID:   [id number]

## 2010-10-19 NOTE — Progress Notes (Signed)
Summary: refill--bystolic  Phone Note Refill Request Message from:  Fax from [facility] on July 15, 2010 11:30 AM  Refills Requested: Medication #1:  BYSTOLIC 5 MG TABS one by mouth once daily   Dosage confirmed as above?Dosage Confirmed   Supply Requested: 1 month   Last Refilled: 03/26/2010 Received fax from [facility]. stating pt is transferring Rxs to their pharmacy and this rx did not have refills to transfer.  Next Appointment Scheduled: 09-01-10 Dr Artist Pais Initial call taken by: Mervin Kung CMA Duncan Dull),  July 15, 2010 11:31 AM    Prescriptions: BYSTOLIC 5 MG TABS (NEBIVOLOL HCL) one by mouth once daily  #30 x 2   Entered by:   Mervin Kung CMA (AAMA)   Authorized by:   D. Thomos Lemons DO   Signed by:   Mervin Kung CMA (AAMA) on 07/15/2010   Method used:   Electronically to        [facility]* (retail)       [address]       [address] [place] [address]       [place], [place]  [address]       Ph: [phone]       Fax: [phone]   RxID:   [id number]

## 2010-10-19 NOTE — Letter (Signed)
Summary: Wynona Luna, OD  Wynona Luna, OD   Imported By: Lanelle Bal 11/12/2008 12:00:36  _____________________________________________________________________  External Attachment:    Type:   Image     Comment:   External Document

## 2010-10-19 NOTE — Assessment & Plan Note (Signed)
Summary: 3 month follow up/mhf   Vital Signs:  Patient profile:   50 year old female Weight:      248.25 pounds BMI:     42.77 O2 Sat:      99 % on Room air Temp:     98.1 degrees F oral Pulse rate:   70 / minute Pulse rhythm:   regular Resp:     18 per minute BP sitting:   132 / 80  (right arm) Cuff size:   large  Vitals Entered By: Glendell Docker CMA (June 09, 2010 10:59 AM)  O2 Flow:  Room air CC: 3 month follow up , Type 2 diabetes mellitus follow-up Is Patient Diabetic? Yes Did you bring your meter with you today? No Pain Assessment Patient in pain? no      Comments no concerns, flu shot to be given with employer   Primary Care Provider:  D. Thomos Lemons DO  CC:  3 month follow up  and Type 2 diabetes mellitus follow-up.  History of Present Illness: Type 2 Diabetes Mellitus Follow-Up      This is a 50 year old woman who presents for Type 2 diabetes mellitus follow-up.  The patient denies polyuria and polydipsia.  The patient denies the following symptoms: chest pain.  Since the last visit the patient reports poor dietary compliance and not exercising regularly.    Current Diet Breakfast:  usually skips breakfast Lunch: sub (sandwich) DInner:  baked chicken, fried chicken,  green beans (rare),  mashed potatoes, mac and cheese Snacks: popsicles - 2 to 3 Beverage: diet moutain dew, diet sierra mist     Preventive Screening-Counseling & Management  Alcohol-Tobacco     Smoking Status: quit  Allergies (verified): No Known Drug Allergies  Past History:  Past Medical History: Depression Headaches - neg MRI of Brain 01/2007 Mild sleep apnea    DM II   Hypertension   hyperlipidemia  Atypical chest pain - normal stress echo 02/2009   LV size was normal. - LV global systolic function was normal. - Normal wall motion; no LV regional wall motion abnormalities.  Social History: Smoking Status:  quit  Physical Exam  General:  alert, well-developed,  and well-nourished.   Lungs:  normal respiratory effort and normal breath sounds.   Heart:  normal rate, regular rhythm, and no gallop.   Extremities:  trace left pedal edema and trace right pedal edema.     Impression & Recommendations:  Problem # 1:  DIABETES MELLITUS, TYPE II (ICD-250.00) Assessment Deteriorated Pt counseled on diet and exercise.  The following medications were removed from the medication list:    Januvia 100 Mg Tabs (Sitagliptin phosphate) ..... One by mouth qd Her updated medication list for this problem includes:    Metformin Hcl 750 Mg Xr24h-tab (Metformin hcl) ..... One by mouth bid    Aspirin 81 Mg Tabs (Aspirin) .Marland Kitchen... Take 1 tab once daily    Tradjenta 5 Mg Tabs (Linagliptin) ..... One by mouth once daily  Labs Reviewed: Creat: 0.8 (06/02/2010)     Last Eye Exam: normal (11/06/2008) Reviewed HgBA1c results: 8.1 (06/02/2010)  6.3 (01/14/2010)  Problem # 2:  HYPERTENSION (ICD-401.9)  Her updated medication list for this problem includes:    Hydrochlorothiazide 25 Mg Tabs (Hydrochlorothiazide) ..... One by mouth once daily    Bystolic 5 Mg Tabs (Nebivolol hcl) ..... One by mouth once daily  BP today: 132/80 Prior BP: 110/70 (03/10/2010)  Labs Reviewed: K+: 4.6 (06/02/2010) Creat: :  0.8 (06/02/2010)   Chol: 142 (12/15/2008)   HDL: 30.60 (12/15/2008)   LDL: 93 (12/15/2008)   TG: 94.0 (12/15/2008)  Complete Medication List: 1)  Simvastatin 10 Mg Tabs (Simvastatin) .... One by mouth once daily 2)  Hydrochlorothiazide 25 Mg Tabs (Hydrochlorothiazide) .... One by mouth once daily 3)  Omeprazole 20 Mg Tbec (Omeprazole) .... Take 1 tablet by mouth two times a day 4)  Metformin Hcl 750 Mg Xr24h-tab (Metformin hcl) .... One by mouth bid 5)  Aspirin 81 Mg Tabs (Aspirin) .... Take 1 tab once daily 6)  Bd U/f Short Pen Needle 31g X 8 Mm Misc (Insulin pen needle) .... Use two times a day as directed 7)  Onetouch Ultra Test Strp (Glucose blood) .... Use to test  blood sugars once daily  ( diagnosis code 250.00) 8)  Bystolic 5 Mg Tabs (Nebivolol hcl) .... One by mouth once daily 9)  Tradjenta 5 Mg Tabs (Linagliptin) .... One by mouth once daily  Patient Instructions: 1)  Please schedule a follow-up appointment in 3 months. 2)  BMP prior to visit, ICD-9:  250.02 3)  HbgA1C prior to visit, ICD-9: 250.02 4)  Please return for lab work one (1) week before your next appointment.  Prescriptions: TRADJENTA 5 MG TABS (LINAGLIPTIN) one by mouth once daily  #30 x 5   Entered and Authorized by:   D. Thomos Lemons DO   Signed by:   D. Thomos Lemons DO on 06/09/2010   Method used:   Electronically to        [facility] (retail)       [address]       [place], [place]  [address]       Ph: [phone]       Fax: [phone]   RxID:   [id number]   Current Allergies (reviewed today): No known allergies

## 2010-10-19 NOTE — Assessment & Plan Note (Signed)
Summary: 3 MONTH ROV-CH   Vital Signs:  Patient profile:   50 year old female Weight:      226.50 pounds BMI:     39.02 Temp:     98.6 degrees F oral Pulse rate:   80 / minute Pulse rhythm:   regular Resp:     16 per minute BP sitting:   110 / 80  (right arm) Cuff size:   large  Vitals Entered By: Glendell Docker CMA (March 26, 2009 4:03 PM)  Primary Care Provider:  Dondra Spry DO  CC:  3 Month follow up disease management and Type 2 diabetes mellitus follow-up.  History of Present Illness: Type 2 Diabetes Mellitus Follow-Up      This is a 50 year old woman who presents for Type 2 diabetes mellitus follow-up.  The patient denies self managed hypoglycemia, hypoglycemia requiring help, and weight gain.  The patient denies the following symptoms: chest pain.  Since the last visit the patient reports good dietary compliance and compliance with medications.  She stopped Byetta 1 month ago due high cost.  However, Byetta helped with blood sugar and appetite control.  Tob abuse - still smoking.  Preventive Screening-Counseling & Management  Alcohol-Tobacco     Smoking Status: never     Smoking Cessation Counseling: yes  Allergies (verified): No Known Drug Allergies  Past History:  Past Medical History: Depression Headaches - neg MRI of Brain 01/2007 Mild sleep apnea   DM II Hypertension  hyperlipidemia   Past Surgical History: D and C Hysterectomy Lysis of adhesions   Social History: Pt works at [employer] in [employer].   No etoh, no drugs. Quit smoking   No regular exercise. G0P0   Smoking Status:  never  Physical Exam  General:  alert, well-developed, and well-nourished.   Head:  normocephalic and atraumatic.   Neck:  supple, no masses, and no carotid bruits.   Lungs:  normal respiratory effort and normal breath sounds.   Heart:  normal rate, regular rhythm, and no gallop.   Abdomen:  soft and non-tender.   Extremities:  trace left pedal edema and  trace right pedal edema.    Diabetes Management Exam:    Foot Exam (with socks and/or shoes not present):       Inspection:          Left foot: normal          Right foot: normal   Impression & Recommendations:  Problem # 1:  DIABETES MELLITUS, TYPE II (ICD-250.00) Good response to Byetta.   A1c improved.  Provided samples.  High co pay.  Consider Victoza.   Her updated medication list for this problem includes:    Metformin Hcl 750 Mg Xr24h-tab (Metformin hcl) ..... One by mouth bid    Aspirin 81 Mg Tabs (Aspirin) .Marland Kitchen... Take 1 tab once daily    Byetta 5 Mcg Pen 5 Mcg/0.47ml Soln (Exenatide) .Marland KitchenMarland KitchenMarland KitchenMarland Kitchen 5 micrograms subcutaneously two times a day  Labs Reviewed: Creat: 0.7 (03/19/2009)     Last Eye Exam: normal (11/06/2008) Reviewed HgBA1c results: 6.3 (03/19/2009)  6.7 (12/15/2008)  Problem # 2:  TOBACCO DEPENDENCE (ICD-305.1)  Her updated medication list for this problem includes:    Eq Nicotine 14 Mg/24hr Pt24 (Nicotine) .Marland Kitchen... Apply qd    Eq Nicotine 7 Mg/24hr Pt24 (Nicotine) .Marland Kitchen... Apply qd    Commit 2 Mg Lozg (Nicotine polacrilex) ..... Use qid as needed  Encouraged smoking cessation and discussed different methods for  smoking cessation.   Complete Medication List: 1)  Metoprolol Succinate 50 Mg Xr24h-tab (Metoprolol succinate) .... One by mouth once daily 2)  Simvastatin 40 Mg Tabs (Simvastatin) .... Take 1 tablet by mouth once a day 3)  Hydrochlorothiazide 25 Mg Tabs (Hydrochlorothiazide) .... One by mouth once daily 4)  Omeprazole 20 Mg Tbec (Omeprazole) .... Take 1 tablet by mouth two times a day 5)  Metformin Hcl 750 Mg Xr24h-tab (Metformin hcl) .... One by mouth bid 6)  Aspirin 81 Mg Tabs (Aspirin) .... Take 1 tab once daily 7)  Eq Nicotine 14 Mg/24hr Pt24 (Nicotine) .... Apply qd 8)  Eq Nicotine 7 Mg/24hr Pt24 (Nicotine) .... Apply qd 9)  Commit 2 Mg Lozg (Nicotine polacrilex) .... Use qid as needed 10)  Byetta 5 Mcg Pen 5 Mcg/0.42ml Soln (Exenatide) .... 5 micrograms  subcutaneously two times a day 11)  Bd U/f Short Pen Needle 31g X 8 Mm Misc (Insulin pen needle) .... Use two times a day as directed  Patient Instructions: 1)  Please schedule a follow-up appointment in 3 months. 2)  BMP prior to visit, ICD-9: 401.9 3)  HbgA1C prior to visit, ICD-9: 250.0 4)  Please return for lab work one (1) week before your next appointment.  Prescriptions: BYETTA 5 MCG PEN 5 MCG/0.02ML SOLN (EXENATIDE) 5 micrograms Subcutaneously two times a day  #1 month x 0   Entered and Authorized by:   D. Thomos Lemons DO   Signed by:   D. Thomos Lemons DO on 03/26/2009   Method used:   Print then Give to Patient   RxID:   [id number]   Current Allergies (reviewed today): No known allergies

## 2010-10-19 NOTE — Assessment & Plan Note (Signed)
Summary: 3 month rov-ch [facility] CARD WRITTEN WRONG/MHF   Vital Signs:  Patient profile:   50 year old female Weight:      229.25 pounds BMI:     39.49 Temp:     98.0 degrees F oral Pulse rate:   80 / minute Pulse rhythm:   regular Resp:     20 per minute BP sitting:   124 / 74  (right arm) Cuff size:   large  Vitals Entered By: Glendell Docker CMA (June 04, 2009 2:28 PM)  Primary Care Provider:  Dondra Spry DO  CC:  3 Month Follow disease management and Type 2 diabetes mellitus follow-up.  History of Present Illness: 3 month Follow up disease management  Type 2 Diabetes Mellitus Follow-Up      This is a 50 year old woman who presents for Type 2 diabetes mellitus follow-up.  The patient reports weight gain.  The patient denies the following symptoms: chest pain.  Since the last visit the patient reports good dietary compliance and compliance with medications.  Since the last visit, the patient reports having had eye care by an ophthalmologist.  She is not able to use byetta due to cost.  Htn - stable.  Allergies (verified): No Known Drug Allergies  Past History:  Past Medical History: Depression Headaches - neg MRI of Brain 01/2007 Mild sleep apnea    DM II Hypertension  hyperlipidemia   Past Surgical History: D and C Hysterectomy Lysis of adhesions    Social History: Pt works at [employer] in [place] [employer].   No etoh, no drugs. Quit smoking   No regular exercise. G0P0      Physical Exam  General:  alert, well-developed, and well-nourished.   Neck:  supple, no masses, and no carotid bruits.   Lungs:  normal respiratory effort and normal breath sounds.   Heart:  normal rate, regular rhythm, and no gallop.   Extremities:  No lower extremity edema   Diabetes Management Exam:    Foot Exam (with socks and/or shoes not present):       Inspection:          Left foot: normal          Right foot: normal   Impression & Recommendations:  Problem # 1:   DIABETES MELLITUS, TYPE II (ICD-250.00) Byetta is cost prohibitive.   Switch to Januvia.   Pt unable to tolerate higher dose metformin due to loose stools.  Her updated medication list for this problem includes:    Metformin Hcl 750 Mg Xr24h-tab (Metformin hcl) ..... One by mouth bid    Aspirin 81 Mg Tabs (Aspirin) .Marland Kitchen... Take 1 tab once daily    Januvia 100 Mg Tabs (Sitagliptin phosphate) ..... One by mouth qd  Labs Reviewed: Creat: 0.8 (05/26/2009)     Last Eye Exam: normal (11/06/2008) Reviewed HgBA1c results: 6.6 (05/26/2009)  6.3 (03/19/2009)  Problem # 2:  HYPERTENSION (ICD-401.9) Stable. Maintain current medication regimen.  Her updated medication list for this problem includes:    Metoprolol Succinate 50 Mg Xr24h-tab (Metoprolol succinate) ..... One by mouth once daily    Hydrochlorothiazide 25 Mg Tabs (Hydrochlorothiazide) ..... One by mouth once daily  BP today: 124/74 Prior BP: 110/80 (03/26/2009)  Labs Reviewed: K+: 4.1 (05/26/2009) Creat: : 0.8 (05/26/2009)   Chol: 142 (12/15/2008)   HDL: 30.60 (12/15/2008)   LDL: 93 (12/15/2008)   TG: 94.0 (12/15/2008)  Problem # 3:  TOBACCO DEPENDENCE (ICD-305.1) She restarted smoking.  We discussed using nicotine replacement when she is ready.  The following medications were removed from the medication list:    Eq Nicotine 14 Mg/24hr Pt24 (Nicotine) .Marland Kitchen... Apply qd    Eq Nicotine 7 Mg/24hr Pt24 (Nicotine) .Marland Kitchen... Apply qd    Commit 2 Mg Lozg (Nicotine polacrilex) ..... Use qid as needed  Complete Medication List: 1)  Metoprolol Succinate 50 Mg Xr24h-tab (Metoprolol succinate) .... One by mouth once daily 2)  Simvastatin 40 Mg Tabs (Simvastatin) .... Take 1 tablet by mouth once a day 3)  Hydrochlorothiazide 25 Mg Tabs (Hydrochlorothiazide) .... One by mouth once daily 4)  Omeprazole 20 Mg Tbec (Omeprazole) .... Take 1 tablet by mouth two times a day 5)  Metformin Hcl 750 Mg Xr24h-tab (Metformin hcl) .... One by mouth bid 6)   Aspirin 81 Mg Tabs (Aspirin) .... Take 1 tab once daily 7)  Januvia 100 Mg Tabs (Sitagliptin phosphate) .... One by mouth qd 8)  Bd U/f Short Pen Needle 31g X 8 Mm Misc (Insulin pen needle) .... Use two times a day as directed 9)  Onetouch Ultra Test Strp (Glucose blood) .... Test once daily  Patient Instructions: 1)  Please schedule a follow-up appointment in 4 months. 2)  BMP prior to visit, ICD-9: 401.9 3)  HbgA1C prior to visit, ICD-9: 250.00 4)  Please return for lab work one (1) week before your next appointment.  Prescriptions: ONETOUCH ULTRA TEST  STRP (GLUCOSE BLOOD) test once daily  #100 x 3   Entered and Authorized by:   D. Thomos Lemons DO   Signed by:   D. Thomos Lemons DO on 06/04/2009   Method used:   Electronically to        [facility]* (retail)       [address]       [address] Shipping/mailing       [place], [place]  [address]       Ph: [phone]       Fax: [phone]   RxID:   [id number] JANUVIA 100 MG TABS (SITAGLIPTIN PHOSPHATE) one by mouth qd  #30 x 5   Entered and Authorized by:   D. Thomos Lemons DO   Signed by:   D. Thomos Lemons DO on 06/04/2009   Method used:   Electronically to        [facility]* (retail)       [address]       [address]mail[address]       [place], [place]  [address]       Ph: [phone]       Fax: [phone]   RxID:   [id number]   Current Allergies (reviewed today): No known allergies

## 2010-10-19 NOTE — Progress Notes (Signed)
Summary: Pharmacy Change  Phone Note Call from Patient Call back at Home Phone [phone]   Caller: Patient Summary of Call: patient called stating she has had problems getting her rx's at [facility] and she would like to have them sent to [facility] on [address]. Patient informed rx would be changed from [facility] to [facility]  Initial call taken by: Glendell Docker CMA,  June 22, 2009 5:21 PM    New/Updated Medications: ONETOUCH ULTRA TEST  STRP (GLUCOSE BLOOD) use to test blood sugars once daily  ( Diagnosis Code 250.00) Prescriptions: ONETOUCH ULTRA TEST  STRP (GLUCOSE BLOOD) use to test blood sugars once daily  ( Diagnosis Code 250.00)  #100 x 5   Entered by:   Glendell Docker CMA   Authorized by:   D. Thomos Lemons DO   Signed by:   Glendell Docker CMA on 06/22/2009   Method used:   Electronically to        [facility] (retail)       [address]       [place], [place]  [address]       Ph: [phone]       Fax: [phone]   RxID:   [id number] JANUVIA 100 MG TABS (SITAGLIPTIN PHOSPHATE) one by mouth qd  #30 x 5   Entered by:   Glendell Docker CMA   Authorized by:   D. Thomos Lemons DO   Signed by:   Glendell Docker CMA on 06/22/2009   Method used:   Electronically to        [facility] (retail)       [address]       [place], [place]  [address]       Ph: [phone]       Fax: [phone]   RxID:   [id number]

## 2010-10-19 NOTE — Letter (Signed)
Summary: [facility]  [facility]   Imported By: Lanelle Bal 07/26/2010 12:04:00  _____________________________________________________________________  External Attachment:    Type:   Image     Comment:   External Document

## 2010-10-19 NOTE — Assessment & Plan Note (Signed)
Summary: CKUP/DT   Vital Signs:  Patient profile:   50 year old female Height:      64 inches Weight:      235 pounds BMI:     40.48 O2 Sat:      100 % on Room air Pulse rate:   88 / minute BP sitting:   134 / 88  (right arm) Cuff size:   large  Vitals Entered By: Payton Spark CMA (January 14, 2010 2:15 PM)  O2 Flow:  Room air CC: F/U   Primary Care Provider:  Dondra Spry DO  CC:  F/U.  History of Present Illness:  Hypertension Follow-Up      This is a 50 year old woman who presents for Hypertension follow-up.  The patient denies lightheadedness and edema.  The patient denies the following associated symptoms: chest pain.  Compliance with medications (by patient report) has been near 100%.  The patient reports that dietary compliance has been fair.    obesity - frustrated by wt gain  Preventive Screening-Counseling & Management  Alcohol-Tobacco     Smoking Status: current  Current Medications (verified): 1)  Metoprolol Succinate 50 Mg  Xr24h-Tab (Metoprolol Succinate) .... One By Mouth Once Daily 2)  Simvastatin 40 Mg  Tabs (Simvastatin) .... Take 1 Tablet By Mouth Once A Day 3)  Hydrochlorothiazide 25 Mg  Tabs (Hydrochlorothiazide) .... One By Mouth Once Daily 4)  Omeprazole 20 Mg  Tbec (Omeprazole) .... Take 1 Tablet By Mouth Two Times A Day 5)  Metformin Hcl 750 Mg Xr24h-Tab (Metformin Hcl) .... One By Mouth Bid 6)  Aspirin 81 Mg Tabs (Aspirin) .... Take 1 Tab Once Daily 7)  Januvia 100 Mg Tabs (Sitagliptin Phosphate) .... One By Mouth Qd 8)  Bd U/f Short Pen Needle 31g X 8 Mm Misc (Insulin Pen Needle) .... Use Two Times A Day As Directed 9)  Onetouch Ultra Test  Strp (Glucose Blood) .... Use To Test Blood Sugars Once Daily  ( Diagnosis Code 250.00)  Allergies (verified): No Known Drug Allergies  Past History:  Past Medical History: Depression Headaches - neg MRI of Brain 01/2007 Mild sleep apnea    DM II Hypertension   hyperlipidemia   Past Surgical  History: D and C Hysterectomy Lysis of adhesions     Family History: both have GERD, Mother has HTN, father has `heart problems`. No h/o cancer. 2 brothers and 1 sister A&W.      Social History: Pt works at [employer] in [place] [employer].   No etoh, no drugs.  No regular exercise. G0P0     Current Smoker Smoking Status:  current  Physical Exam  General:  alert and overweight-appearing.   Lungs:  normal respiratory effort, normal breath sounds, no crackles, and no wheezes.   Heart:  normal rate, regular rhythm, and no gallop.   Extremities:  trace left pedal edema and trace right pedal edema.     Impression & Recommendations:  Problem # 1:  HYPERTENSION (ICD-401.9) pt frustrated with wt gain.  dc metoprolol.  change to bystolic.  pt advised to monitor bp at home and report any sign change  The following medications were removed from the medication list:    Metoprolol Succinate 50 Mg Xr24h-tab (Metoprolol succinate) ..... One by mouth once daily Her updated medication list for this problem includes:    Hydrochlorothiazide 25 Mg Tabs (Hydrochlorothiazide) ..... One by mouth once daily    Bystolic 5 Mg Tabs (Nebivolol hcl) ..... One by  mouth once daily  Orders: T-Basic Metabolic Panel [id number])  BP today: 134/88 Prior BP: 124/74 (06/04/2009)  Labs Reviewed: K+: 4.2 (09/28/2009) Creat: : 0.8 (09/28/2009)   Chol: 142 (12/15/2008)   HDL: 30.60 (12/15/2008)   LDL: 93 (12/15/2008)   TG: 94.0 (12/15/2008)  Problem # 2:  TOBACCO DEPENDENCE (ICD-305.1) she tried use patch but stopped after experiencing chest pain  Her updated medication list for this problem includes:    Chantix Starting Month Pak 0.5 Mg X 11 & 1 Mg X 42 Tabs (Varenicline tartrate) ..... Use as directed    Chantix Continuing Month Pak 1 Mg Tabs (Varenicline tartrate) ..... Use as directed  Problem # 3:  HYPERLIPIDEMIA (ICD-272.4) pt experiencing cramping of her back muscles at night.  lower statin  dose check electrolytes  Her updated medication list for this problem includes:    Simvastatin 40 Mg Tabs (Simvastatin) .Marland Kitchen... Take 1/2  tablet by mouth once a day  Complete Medication List: 1)  Simvastatin 40 Mg Tabs (Simvastatin) .... Take 1/2  tablet by mouth once a day 2)  Hydrochlorothiazide 25 Mg Tabs (Hydrochlorothiazide) .... One by mouth once daily 3)  Omeprazole 20 Mg Tbec (Omeprazole) .... Take 1 tablet by mouth two times a day 4)  Metformin Hcl 750 Mg Xr24h-tab (Metformin hcl) .... One by mouth bid 5)  Aspirin 81 Mg Tabs (Aspirin) .... Take 1 tab once daily 6)  Januvia 100 Mg Tabs (Sitagliptin phosphate) .... One by mouth qd 7)  Bd U/f Short Pen Needle 31g X 8 Mm Misc (Insulin pen needle) .... Use two times a day as directed 8)  Onetouch Ultra Test Strp (Glucose blood) .... Use to test blood sugars once daily  ( diagnosis code 250.00) 9)  Chantix Starting Month Pak 0.5 Mg X 11 & 1 Mg X 42 Tabs (Varenicline tartrate) .... Use as directed 10)  Chantix Continuing Month Pak 1 Mg Tabs (Varenicline tartrate) .... Use as directed 11)  Bystolic 5 Mg Tabs (Nebivolol hcl) .... One by mouth once daily  Other Orders: T- Hemoglobin A1C ([id number]) T-Urine Microalbumin w/creat. ratio [id number])  Patient Instructions: 1)  Please schedule a follow-up appointment in 2 months. Prescriptions: CHANTIX CONTINUING MONTH PAK 1 MG TABS (VARENICLINE TARTRATE) use as directed  #1 x 3   Entered and Authorized by:   D. Thomos Lemons DO   Signed by:   D. Thomos Lemons DO on 01/14/2010   Method used:   Print then Give to Patient   RxID:   [id number] CHANTIX STARTING MONTH PAK 0.5 MG X 11 & 1 MG X 42 TABS (VARENICLINE TARTRATE) use as directed  #1 x 0   Entered and Authorized by:   D. Thomos Lemons DO   Signed by:   D. Thomos Lemons DO on 01/14/2010   Method used:   Print then Give to Patient   RxID:   [id number]

## 2010-10-19 NOTE — Progress Notes (Signed)
Summary: Metoprolol Refill  Phone Note Refill Request Message from:  Fax from Pharmacy on October 22, 2009 1:03 PM  Refills Requested: Medication #1:  METOPROLOL SUCCINATE 50 MG  XR24H-TAB one by mouth once daily   Dosage confirmed as above?Dosage Confirmed   Brand Name Necessary? No   Supply Requested: 1 month   Last Refilled: 09/21/2009  Method Requested: Electronic Next Appointment Scheduled: none Initial call taken by: Roselle Locus,  October 22, 2009 1:03 PM  Follow-up for Phone Call        ok to refill x 3 Follow-up by: D. Thomos Lemons DO,  October 22, 2009 1:29 PM  Additional Follow-up for Phone Call Additional follow up Details #1::        rx sent electronically to pharmacy Additional Follow-up by: Glendell Docker CMA,  October 22, 2009 2:12 PM    Prescriptions: METOPROLOL SUCCINATE 50 MG  XR24H-TAB (METOPROLOL SUCCINATE) one by mouth once daily  #30 x 3   Entered by:   Glendell Docker CMA   Authorized by:   D. Thomos Lemons DO   Signed by:   Glendell Docker CMA on 10/22/2009   Method used:   [facility]       [address]       [place], [place]  [address]       Ph: [phone]       Fax: [phone]   RxID:   [id number]

## 2010-10-19 NOTE — Assessment & Plan Note (Signed)
Summary: [facility]   Visit Type:  Initial Consult Primary Provider:  Dondra Spry DO  CC:  chest pain.  History of Present Illness: 50 year old female with past medical history of diabetes, hypertension and hyperlipidemia who am asked to evaluate for chest pain. The patient has no prior cardiac history. Over the past 3-4 months she has had intermittent chest pain. The pain is in the left upper chest area. It is described as an aching sensation. The pain does not radiate. It is not pleuritic or positional nor is it related to food. It is not exertional. It can last 15 minutes at a time and resolve spontaneously. Note the patient did state that she has recently discontinued tobacco abuse and her chest pain is improved since then. She denies any dyspnea on exertion, orthopnea or exertional chest pain. She occasionally has mild pedal edema. Because of the above we were asked to further evaluate.  Current Medications (verified): 1)  Metoprolol Succinate 50 Mg  Xr24h-Tab (Metoprolol Succinate) .... One By Mouth Once Daily 2)  Simvastatin 40 Mg  Tabs (Simvastatin) .... Take 1 Tablet By Mouth Once A Day 3)  Hydrochlorothiazide 25 Mg  Tabs (Hydrochlorothiazide) .... One By Mouth Once Daily 4)  Omeprazole 20 Mg  Tbec (Omeprazole) .... Take 1 Tablet By Mouth Two Times A Day 5)  Metformin Hcl 750 Mg Xr24h-Tab (Metformin Hcl) .... One By Mouth Bid 6)  Aspirin 81 Mg Tabs (Aspirin) .... Take 1 Tab Once Daily 7)  Eq Nicotine 14 Mg/24hr Pt24 (Nicotine) .... Apply Qd 8)  Eq Nicotine 7 Mg/24hr Pt24 (Nicotine) .... Apply Qd 9)  Commit 2 Mg Lozg (Nicotine Polacrilex) .... Use Qid As Needed 10)  Byetta 5 Mcg Pen 5 Mcg/0.41ml Soln (Exenatide) .... 5 Micrograms Subcutaneously Two Times A Day 11)  Bd U/f Short Pen Needle 31g X 8 Mm Misc (Insulin Pen Needle) .... Use Two Times A Day As Directed  Allergies: No Known Drug Allergies  Past History:  Past Medical History:    Reviewed history from 12/23/2008  and no changes required:    Depression    Headaches - neg MRI of Brain 01/2007    Mild sleep apnea      DM II    Hypertension     hyperlipidemia  Past Surgical History:    Reviewed history from 12/23/2008 and no changes required:    D and C    Hysterectomy    Lysis of adhesions  Family History:    Reviewed history from 12/22/2008 and no changes required:       both have GERD, Mother has HTN, father has `heart problems`. No h/o cancer. 2 brothers and 1 sister A&W.     Social History:    Reviewed history from 12/22/2008 and no changes required:       Pt works at [employer] in [employer].         No etoh, no drugs.       Quit smoking        Does have h/o marijuana use in past.        No regular exercise. G0P0     Review of Systems       Patient denies any fevers or chills, productive cough, a moderate-sized, dysphasia, odynophagia, melena, hematochezia, dysuria, hematuria, rash, seizure activity, orthopnea, claudication. The remaining systems are negative.  Vital Signs:  Patient profile:   50 year old female Height:      64 inches Weight:  231.04 pounds BMI:     39.80 Pulse rate:   80 / minute Pulse rhythm:   regular Resp:     18 per minute BP sitting:   120 / 78  (left arm) Cuff size:   regular  Physical Exam  General:  Well developed/obese in NAD Skin warm/dry Patient not depressed No peripheral clubbing Back-normal HEENT-normal/normal eyelids Neck supple/normal carotid upstroke bilaterally; no bruits; no JVD; no thyromegaly chest - CTA/ normal expansion CV - RRR/normal S1 and S2; no murmurs, rubs or gallops; no rub; PMI nondisplaced Abdomen -NT/ND, no HSM, no mass, + bowel sounds, no bruit 2+ femoral pulses, no bruits Ext-no edema, chords, 2+ DP Neuro-grossly nonfocal      Impression & Recommendations:  Problem # 1:  CHEST PAIN, ATYPICAL (ICD-786.59) The patient's symptoms are atypical. However she has multiple risk factors including recently  diagnosed diabetes mellitus, hypertension, hyperlipidemia and tobacco abuse. She is also planning to initiate an exercise program. We will risk stratify with a stress echocardiogram. If it is normal we will not pursue further cardiac evaluation. Her updated medication list for this problem includes:    Metoprolol Succinate 50 Mg Xr24h-tab (Metoprolol succinate) ..... One by mouth once daily    Aspirin 81 Mg Tabs (Aspirin) .Marland Kitchen... Take 1 tab once daily  Problem # 2:  HYPERTENSION (ICD-401.9) Her blood pressure is well controlled on her present medications. Her updated medication list for this problem includes:    Metoprolol Succinate 50 Mg Xr24h-tab (Metoprolol succinate) ..... One by mouth once daily    Hydrochlorothiazide 25 Mg Tabs (Hydrochlorothiazide) ..... One by mouth once daily    Aspirin 81 Mg Tabs (Aspirin) .Marland Kitchen... Take 1 tab once daily  Problem # 3:  DIABETES MELLITUS, TYPE II (ICD-250.00) Management per Dr. Artist Pais Her updated medication list for this problem includes:    Metformin Hcl 750 Mg Xr24h-tab (Metformin hcl) ..... One by mouth bid    Aspirin 81 Mg Tabs (Aspirin) .Marland Kitchen... 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 Her updated medication list for this problem includes:    Simvastatin 40 Mg Tabs (Simvastatin) .Marland Kitchen... Take 1 tablet by mouth once a day  Problem # 5:  TOBACCO DEPENDENCE (ICD-305.1) The patient recently discontinued her tobacco abuse. I encouraged her to continue with this as well as other lifestyle modifications including diet and exercise.  Patient Instructions: 1)  The patient will follow up with [place] on a p.r.n. basis pending the results of her stress echocardiogram.

## 2010-10-19 NOTE — Assessment & Plan Note (Signed)
Summary: 4 months rov-ch   Vital Signs:  Patient Profile:   50 Years Old Female Height:     60 inches Weight:      235.00 pounds Temp:     98.6 degrees F oral Pulse rate:   72 / minute BP sitting:   130 / 86  (left arm)  Pt. in pain?   no  Vitals Entered By: Jeremy Johann CMA (November 20, 2008 4:01 PM)                  PCP:  Dondra Spry DO  Chief Complaint:  follow-up and discuss smoking .  History of Present Illness:       This is a 50 years old female who presents with Chest pain.  The patient reports resting chest pain, but denies exertional chest pain, nausea, vomiting, and diaphoresis.  The pain is located in the substernal area.  She denies dysphagia or severe heartburn.  Hypertension Follow-Up      The patient also presents for Hypertension follow-up.  The patient denies lightheadedness and urinary frequency.  Associated symptoms include chest pain and dyspnea.  Compliance with medications (by patient report) has been near 100%.  The patient reports that dietary compliance has been fair.    Abnormal glucose-no significant weight loss since previous visit.  She is trying to make dietary changes.    Current Allergies (reviewed today): No known allergies   Past Medical History:    Depression    Headaches - neg MRI of Brain 01/2007    Mild sleep apnea     Abnormal glucose    Family History:    both have GERD, Mother has HTN, father has `heart problems`. No h/o cancer. 2 brothers and 1 sister A&W.    Social History:    Pt works at [employer] in [employer]. She lives in [place] with dogs. She is currently in a relationship is sexually active but not using protection. She smokes 1/2 ppd. No etoh, no drugs. Does have h/o marijuana use in past. No regular exercise. G0P0       Review of Systems       See HPI.  All other systems negative.    Physical Exam  General:     alert, well-developed, and well-nourished.   Head:     normocephalic and  atraumatic.   Neck:     supple, no masses, and no carotid bruits.   Lungs:     normal respiratory effort and normal breath sounds.   Heart:     normal rate, regular rhythm, and no gallop.   Abdomen:     soft and non-tender.   Extremities:     trace left pedal edema and 1+ right pedal edema.   Neurologic:     alert & oriented X3 and cranial nerves II-XII intact.      Impression & Recommendations:  Problem # 1:  CHEST PAIN, ATYPICAL (ICD-28.34) 50 year old A. American female with atypical chest pain. Patient has multiple risk factors. Refer to cardiology for further evaluation. Orders: T-2 View CXR, Same Day (71020.5TC) Cardiology Referral (Cardiology)  EKGs shows normal sinus rhythm at 80 beats per minute. No acute ST changes or Q waves.   Problem # 2:  DIABETES MELLITUS, TYPE II, BORDERLINE (ICD-790.29) Patient's previous A1c in 05/2008 was 6.5.  I urged lifestyle modification. Continue metformin. Her updated medication list for this problem includes:    Metformin Hcl 750 Mg Xr24h-tab (Metformin hcl) ..... One  by mouth bid  Labs Reviewed: Creat: 0.7 (05/27/2008)      Problem # 3:  HYPERTENSION (ICD-401.9) BP is stable continue medication regimen. Consider adding ACE inhibitor. Her updated medication list for this problem includes:    Metoprolol Succinate 50 Mg Xr24h-tab (Metoprolol succinate) ..... One by mouth once daily    Hydrochlorothiazide 25 Mg Tabs (Hydrochlorothiazide) ..... One by mouth once daily  BP today: 130/86 Prior BP: 126/88 (06/19/2008)  Labs Reviewed: Creat: 0.7 (05/27/2008) Chol: 135 (05/27/2008)   HDL: 26.0 (05/27/2008)   LDL: 91 (05/27/2008)   TG: 88 (05/27/2008)   Problem # 4:  TOBACCO DEPENDENCE (ICD-305.1)  Her updated medication list for this problem includes:    Eq Nicotine 14 Mg/24hr Pt24 (Nicotine) .Marland Kitchen... Apply qd    Eq Nicotine 7 Mg/24hr Pt24 (Nicotine) .Marland Kitchen... Apply qd    Commit 2 Mg Lozg (Nicotine polacrilex) ..... Use qid as needed  Encouraged smoking cessation and discussed different methods for smoking cessation.   Complete Medication List: 1)  Metoprolol Succinate 50 Mg Xr24h-tab (Metoprolol succinate) .... One by mouth once daily 2)  Simvastatin 40 Mg Tabs (Simvastatin) .... Take 1 tablet by mouth once a day 3)  Hydrochlorothiazide 25 Mg Tabs (Hydrochlorothiazide) .... One by mouth once daily 4)  Omeprazole 20 Mg Tbec (Omeprazole) .... Take 1 tablet by mouth two times a day 5)  Metformin Hcl 750 Mg Xr24h-tab (Metformin hcl) .... One by mouth bid 6)  Aspirin 81 Mg Tabs (Aspirin) .... Take 1 tab once daily 7)  Eq Nicotine 14 Mg/24hr Pt24 (Nicotine) .... Apply qd 8)  Eq Nicotine 7 Mg/24hr Pt24 (Nicotine) .... Apply qd 9)  Commit 2 Mg Lozg (Nicotine polacrilex) .... Use qid as needed   Patient Instructions: 1)  Please schedule a follow-up appointment in 1 month. 2)  BMP prior to visit, ICD-9:  401.9 3)  Hepatic Panel prior to visit, ICD-9:  272.4 4)  Lipid Panel prior to visit, ICD-9: 272.4 5)  HbgA1C prior to visit, ICD-9:  790.29 6)  Urine Microalbumin prior to visit, ICD-9:  790.29 7)  Join smoking cessation program at [facility].   Prescriptions: METFORMIN HCL 750 MG XR24H-TAB (METFORMIN HCL) one by mouth bid  #60 x 5   Entered and Authorized by:   D. Thomos Lemons DO   Signed by:   D. Thomos Lemons DO on 11/20/2008   Method used:   Electronically to        [facility] (retail)       [address]       [place], [place]  [address]       Ph: [phone]       Fax: [phone]   RxID:   [id number] SIMVASTATIN 40 MG  TABS (SIMVASTATIN) Take 1 tablet by mouth once a day  #30 x 5   Entered and Authorized by:   D. Thomos Lemons DO   Signed by:   D. Thomos Lemons DO on 11/20/2008   Method used:   Electronically to        [facility] (retail)       [address]       [place], [place]  [address]       Ph: [phone]       Fax: [phone]   RxID:    [id number] HYDROCHLOROTHIAZIDE 25 MG  TABS (HYDROCHLOROTHIAZIDE) one by mouth once  daily  #30 x 5   Entered and Authorized by:   D. Thomos Lemons DO   Signed by:   D. Thomos Lemons DO on 11/20/2008   Method used:   Electronically to        [facility] [address] (retail)       [address]       [place], [place]  [address]       Ph: [phone]       Fax: [phone]   RxID:   [id number] METOPROLOL SUCCINATE 50 MG  XR24H-TAB (METOPROLOL SUCCINATE) one by mouth once daily  #30 x 5   Entered and Authorized by:   D. Thomos Lemons DO   Signed by:   D. Thomos Lemons DO on 11/20/2008   Method used:   Electronically to        [facility] (retail)       [address]       [place], [place]  [address]       Ph: [phone]       Fax: [phone]   RxID:   [id number] COMMIT 2 MG LOZG (NICOTINE POLACRILEX) use qid as needed  #120 x 3   Entered and Authorized by:   D. Thomos Lemons DO   Signed by:   D. Thomos Lemons DO on 11/20/2008   Method used:   Electronically to        [facility] [address] (retail)       [address]       [place], [place]  [address]       Ph: [phone]       Fax: [phone]   RxID:   [id number] EQ NICOTINE 7 MG/24HR PT24 (NICOTINE) apply qd  #30 x 0   Entered and Authorized by:   D. Thomos Lemons DO   Signed by:   D. Thomos Lemons DO on 11/20/2008   Method used:   Electronically to        [facility] [address] (retail)       [address]       [place], [place]  [address]       Ph: [phone]       Fax: [phone]   RxID:   [id number] EQ NICOTINE 14 MG/24HR PT24 (NICOTINE) apply qd  #30 x 0   Entered and Authorized by:   D. Thomos Lemons DO   Signed by:   D. Thomos Lemons DO on 11/20/2008   Method used:   Electronically to        [facility] (retail)       [address]       [place], [place]  [address]       Ph: [phone]        Fax: [phone]   RxID:   [id number]

## 2010-10-19 NOTE — Assessment & Plan Note (Signed)
Summary: FU / $50 Eileen Richards   Vital Signs:  Patient Profile:   50 Years Old Female Height:     60 inches Weight:      236.25 pounds BMI:     46.31 Temp:     98.1 degrees F oral Pulse rate:   88 / minute BP sitting:   134 / 88  (right arm)  Vitals Entered By: Glendell Docker (Jan 30, 2008 3:19 PM)                 Chief Complaint:  Multiple medical problems or concerns and Headaches.  History of Present Illness:  Headaches      This is a 50 year old woman who presents with Headaches.  The patient denies nausea, vomiting, and photophobia.  The headache is described as intermittent.  The location of the pain is occipital.  The patient denies the following high-risk features: fever and neck pain/stiffness.  Prior treatment has included acetaminophen.  Pt  notes increased stress at work.  She notes muscle soreness of right shoulder.    Current Allergies (reviewed today): No known allergies   Past Medical History:    Depression    Headaches - neg MRI of Brain 01/2007    Mild sleep apnea   Social History:    Reviewed history from 11/16/2006 and no changes required:       Pt works at [employer] in [employer]. She lives in [place] with dogs. She is currently in a relationship is sexually active but not using protection. She smokes 1/2 ppd. No etoh, no drugs. Does have h/o marijuana use in past. No regular exercise. G0P0   Risk Factors:  Mammogram History:     Date of Last Mammogram:  01/18/2008    Results:  normal    Review of Systems      See HPI   Physical Exam  General:     alert, well-developed, and well-nourished.   Head:     normocephalic and atraumatic.   Eyes:     vision grossly intact, pupils equal, pupils round, pupils reactive to light, and no nystagmus.   Ears:     R ear normal and L ear normal.   Neck:     supple.  decreased ROM Lungs:     normal respiratory effort and normal breath sounds.   Heart:     normal rate, regular rhythm, and no  gallop.      Impression & Recommendations:  Problem # 1:  HEADACHE, TENSION (ICD-307.81) Pt has skelaxin from prev visit.  Pt advised to take 800 mg of skelaxin and 600 mg of ibuprfen q 12 hrs.  Patient advised to call office if symptoms worsen.  Her updated medication list for this problem includes:    Metoprolol Tartrate 25 Mg Tabs (Metoprolol tartrate) .Marland Kitchen... Take 1 tablet by mouth two times a day   Complete Medication List: 1)  Metoprolol Tartrate 25 Mg Tabs (Metoprolol tartrate) .... Take 1 tablet by mouth two times a day 2)  Simvastatin 40 Mg Tabs (Simvastatin) .... Take 1 tablet by mouth once a day 3)  Hydrochlorothiazide 12.5 Mg Tabs (Hydrochlorothiazide) .... Take 1 tablet by mouth once a day 4)  Omeprazole 20 Mg Tbec (Omeprazole) .... Take 1 tablet by mouth two times a day 5)  Metformin Hcl 500 Mg Tb24 (Metformin hcl) .... Take 1 tablet by mouth two times a day    ] Current Allergies (reviewed today): No known allergies  Preventive Care Screening  Mammogram:    Date:  01/18/2008    Results:  normal

## 2010-10-19 NOTE — Progress Notes (Signed)
Summary: Metformin refill  Phone Note Refill Request Message from:  Fax from Pharmacy on Jan 22, 2010 5:48 PM  Refills Requested: Medication #1:  METFORMIN HCL 750 MG XR24H-TAB one by mouth bid   Dosage confirmed as above?Dosage Confirmed   Brand Name Necessary? No   Supply Requested: 1 month   Last Refilled: 10/22/2009  Method Requested: Electronic Next Appointment Scheduled: 6/22 Dr Artist Pais Initial call taken by: Glendell Docker CMA,  Jan 22, 2010 5:49 PM  Follow-up for Phone Call        Rx completed in Dr. Tiajuana Amass Follow-up by: Glendell Docker CMA,  Jan 22, 2010 5:49 PM    Prescriptions: METFORMIN HCL 750 MG XR24H-TAB (METFORMIN HCL) one by mouth bid  #60 x 5   Entered by:   Glendell Docker CMA   Authorized by:   D. Thomos Lemons DO   Signed by:   Glendell Docker CMA on 01/22/2010   Method used:   Electronically to        [facility] (retail)       [address]       [place], [place]  [address]       Ph: [phone]       Fax: [phone]   RxID:   [id number]

## 2010-10-19 NOTE — Miscellaneous (Signed)
Summary: [facility] Home Visit  [facility] Home Visit   Imported By: Maryln Gottron 07/16/2010 09:42:15  _____________________________________________________________________  External Attachment:    Type:   Image     Comment:   External Document

## 2010-10-21 NOTE — Medication Information (Signed)
Summary: Letter Regarding Use of ACE or ARB in Diabetes & Hypertension/[facility]  Letter Regarding Use of ACE or ARB in Diabetes & Hypertension/[facility]   Imported By: Lanelle Bal 08/31/2010 12:34:39  _____________________________________________________________________  External Attachment:    Type:   Image     Comment:   External Document

## 2010-10-21 NOTE — Assessment & Plan Note (Signed)
Summary: 3 month follow up/mhf   Vital Signs:  Patient profile:   50 year old female Height:      64 inches Weight:      246 pounds BMI:     42.38 O2 Sat:      100 % on Room air Temp:     97.5 degrees F oral Pulse rate:   65 / minute Resp:     20 per minute BP sitting:   124 / 80  (right arm) Cuff size:   large  Vitals Entered By: Glendell Docker CMA (September 21, 2010 3:04 PM)  O2 Flow:  Room air CC: 3 Month follow up Is Patient Diabetic? Yes Did you bring your meter with you today? No Pain Assessment Patient in pain? no      Comments c/o daily wheezing when walking , low blood sugar 138, high 169     Last PAP Result appt for 10/10/2010   Primary Care Provider:  Dondra Spry DO  CC:  3 Month follow up.  History of Present Illness: pt c/o wheezing worse with walking in the parking lot gets winded easilty no chest pain or chest pressure quit smoking 8 months ago - has not restarted denies exposure to second hand smoke   Preventive Screening-Counseling & Management  Alcohol-Tobacco     Smoking Status: quit  Allergies (verified): No Known Drug Allergies  Past History:  Past Medical History: Depression Headaches - neg MRI of Brain 01/2007 Mild sleep apnea    DM II   Hypertension    hyperlipidemia  Atypical chest pain - normal stress echo 02/2009   LV size was normal. - LV global systolic function was normal. - Normal wall motion; no LV regional wall motion abnormalities. Childhood asthma  Past Surgical History: D and C Hysterectomy   Lysis of adhesions     Family History: both have GERD, Mother has HTN, father has `heart problems`. No h/o cancer. 2 brothers and 1 sister A&W.       Social History: Pt works at [employer] in [employer].   No etoh, no drugs.  No regular exercise. G0P0     Current Smoker    Physical Exam  General:  alert, well-developed, and well-nourished.   Ears:  R ear normal and L ear normal.   Mouth:  pharynx pink  and moist.   Neck:  supple, no masses, and no carotid bruits.   Lungs:  normal respiratory effort and normal breath sounds.  no crackles and no wheezes.   Heart:  normal rate, regular rhythm, and no gallop.     Impression & Recommendations:  Problem # 1:  WHEEZING (ICD-786.07) Assessment Deteriorated pt likely has mild asthma start qvar  Orders: T-2 View CXR, Same Day (71020.5TC)  Problem # 2:  DIABETES MELLITUS, TYPE II (ICD-250.00) Assessment: Improved  Her updated medication list for this problem includes:    Metformin Hcl 750 Mg Xr24h-tab (Metformin hcl) ..... One by mouth bid    Aspirin 81 Mg Tabs (Aspirin) .Marland Kitchen... Take 1 tab once daily    Tradjenta 5 Mg Tabs (Linagliptin) ..... One by mouth once daily  Orders: T-Basic Metabolic Panel [phone]) T- Hemoglobin A1C ([address]11-91478)  Problem # 3:  DYSPNEA ON EXERTION (ICD-786.09) Assessment: Deteriorated  Her updated medication list for this problem includes:    Hydrochlorothiazide 25 Mg Tabs (Hydrochlorothiazide) ..... One by mouth once daily    Bystolic 5 Mg Tabs (Nebivolol hcl) ..... One by mouth once daily  Qvar 80 Mcg/act Aers (Beclomethasone dipropionate) .Marland Kitchen... 2 puffs two times a day  Orders: Spirometry w/Graph (94010) T-CBC No Diff ([id number])  Complete Medication List: 1)  Simvastatin 10 Mg Tabs (Simvastatin) .... One by mouth once daily 2)  Hydrochlorothiazide 25 Mg Tabs (Hydrochlorothiazide) .... One by mouth once daily 3)  Omeprazole 20 Mg Tbec (Omeprazole) .... Take 1 tablet by mouth two times a day 4)  Metformin Hcl 750 Mg Xr24h-tab (Metformin hcl) .... One by mouth bid 5)  Aspirin 81 Mg Tabs (Aspirin) .... Take 1 tab once daily 6)  Bd U/f Short Pen Needle 31g X 8 Mm Misc (Insulin pen needle) .... Use two times a day as directed 7)  Bystolic 5 Mg Tabs (Nebivolol hcl) .... One by mouth once daily 8)  Tradjenta 5 Mg Tabs (Linagliptin) .... One by mouth once daily 9)  Truetrack Blood Glucose W/device  Kit (Blood glucose monitoring suppl) .... Use to test blood sugar (250.00) 10)  Truetrack Test Strp (Glucose blood) .... Use to test blood sugar three times a day (250.00) 11)  Lancet Device Misc (Lancet devices) .... Use to test blood sugar three times a day (250.00) 12)  Qvar 80 Mcg/act Aers (Beclomethasone dipropionate) .... 2 puffs two times a day  Patient Instructions: 1)  Please schedule a follow-up appointment in 1 month. Prescriptions: QVAR 80 MCG/ACT AERS (BECLOMETHASONE DIPROPIONATE) 2 puffs two times a day  #1 x 2   Entered and Authorized by:   D. Thomos Lemons DO   Signed by:   D. Thomos Lemons DO on 09/21/2010   Method used:   Electronically to        [facility]* (retail)       [address]       [address] Shipping/mailing       [place], [place]  [address]       Ph: [phone]       Fax: [phone]   RxID:   [id number]    Orders Added: 1)  Spirometry w/Graph [94010] 2)  T-2 View CXR, Same Day [71020.5TC] 3)  T-Basic Metabolic Panel [80048-22910] 4)  T- Hemoglobin A1C [83036-23375] 5)  T-CBC No Diff [[address]27-10000] 6)  Est. Patient Level III [50093]   Immunization History:  Influenza Immunization History:    Influenza:  historical (06/22/2010)   Immunization History:  Influenza Immunization History:    Influenza:  Historical (06/22/2010)  Current Allergies (reviewed today): No known allergies     Preventive Care Screening  Pap Smear:    Date:  09/21/2010    Results:  appt for 10/10/2010  Last Flu Shot:    Date:  06/22/2010    Results:  Historical

## 2010-10-21 NOTE — Progress Notes (Signed)
Summary: Lab Results  Phone Note Outgoing Call   Summary of Call:  call pt - CXR normal.   blood tests - no anemia.  DM II - A1c 7.0 (better) Initial call taken by: D. Thomos Lemons DO,  September 22, 2010 12:52 PM  Follow-up for Phone Call        call placed to patient at [phone], no answer. A detailed voice message was left informing patient per Dr Artist Pais instructions. Message was left for patient to return call if any questions Follow-up by: Glendell Docker CMA,  September 22, 2010 2:22 PM

## 2010-10-21 NOTE — Letter (Signed)
Summary: Med Link  Med Link   Imported By: Lanelle Bal 09/17/2010 08:21:12  _____________________________________________________________________  External Attachment:    Type:   Image     Comment:   External Document

## 2010-11-01 ENCOUNTER — Ambulatory Visit (INDEPENDENT_AMBULATORY_CARE_PROVIDER_SITE_OTHER): Payer: 59 | Admitting: Internal Medicine

## 2010-11-01 ENCOUNTER — Encounter: Payer: Self-pay | Admitting: Internal Medicine

## 2010-11-01 DIAGNOSIS — E785 Hyperlipidemia, unspecified: Secondary | ICD-10-CM

## 2010-11-01 DIAGNOSIS — R0609 Other forms of dyspnea: Secondary | ICD-10-CM

## 2010-11-01 DIAGNOSIS — E119 Type 2 diabetes mellitus without complications: Secondary | ICD-10-CM

## 2010-11-01 DIAGNOSIS — R0989 Other specified symptoms and signs involving the circulatory and respiratory systems: Secondary | ICD-10-CM

## 2010-11-08 ENCOUNTER — Encounter: Payer: Self-pay | Admitting: Internal Medicine

## 2010-11-16 NOTE — Consult Note (Signed)
Summary: Francene Boyers, O.D.  Francene Boyers, O.D.   Imported By: Maryln Gottron 11/11/2010 15:57:13  _____________________________________________________________________  External Attachment:    Type:   Image     Comment:   External Document

## 2010-11-25 ENCOUNTER — Encounter: Payer: Self-pay | Admitting: Internal Medicine

## 2010-11-25 NOTE — Assessment & Plan Note (Signed)
Summary: 1 month follow up/mhf   Vital Signs:  Patient profile:   50 year old female Height:      64 inches Weight:      246 pounds BMI:     42.38 O2 Sat:      98 % on Room air Temp:     97.9 degrees F oral Pulse rate:   91 / minute Resp:     18 per minute BP sitting:   118 / 80  (left arm) Cuff size:   large  Vitals Entered By: Glendell Docker CMA (November 01, 2010 3:15 PM)  O2 Flow:  Room air  CC: 1 Month follow up, Type 2 diabetes mellitus follow-up Is Patient Diabetic? Yes Did you bring your meter with you today? Yes Pain Assessment Patient in pain? no      Comments low blood sugar 124  high 217 avg 133-155   Primary Care Provider:  Dondra Spry DO  CC:  1 Month follow up and Type 2 diabetes mellitus follow-up.  History of Present Illness:  Type 2 Diabetes Mellitus Follow-Up      This is a 50 year old woman who presents for Type 2 diabetes mellitus follow-up.  The patient denies weight loss.  The patient denies the following symptoms: chest pain.  Since the last visit the patient reports good dietary compliance, not exercising regularly, and monitoring blood glucose.    dyspnea slightly improved since using qvar    Preventive Screening-Counseling & Management  Alcohol-Tobacco     Smoking Status: quit  Allergies (verified): No Known Drug Allergies  Past History:  Past Medical History: Depression Headaches - neg MRI of Brain 01/2007 Mild sleep apnea     DM II   Hypertension    hyperlipidemia  Atypical chest pain - normal stress echo 02/2009   LV size was normal. - LV global systolic function was normal. - Normal wall motion; no LV regional wall motion abnormalities. Childhood asthma  Past Surgical History: D and C Hysterectomy   Lysis of adhesions      Social History: Pt works at [employer] in [employer].  (8 hrs /days - 6 days per week)   No etoh, no drugs.  No regular exercise. G0P0     Former Smoker    Physical Exam  General:   alert, well-developed, and well-nourished.   Neck:  supple, no masses, and no carotid bruits.   Lungs:  normal respiratory effort and normal breath sounds.   Heart:  normal rate, regular rhythm, and no gallop.     Impression & Recommendations:  Problem # 1:  WHEEZING (ICD-786.07) Assessment Improved less dyspnea no sore throat or thrush continue Qvar deconditioning/ obesity also likely playing a role encouraged wt loss  Problem # 2:  DIABETES MELLITUS, TYPE II (ICD-250.00)  Her updated medication list for this problem includes:    Metformin Hcl 750 Mg Xr24h-tab (Metformin hcl) ..... One by mouth bid    Aspirin 81 Mg Tabs (Aspirin) .Marland Kitchen... Take 1 tab once daily    Tradjenta 5 Mg Tabs (Linagliptin) ..... One by mouth once daily  Labs Reviewed: Creat: 0.74 (09/21/2010)     Last Eye Exam: normal (11/06/2008) Reviewed HgBA1c results: 7.0 (09/21/2010)  8.1 (06/02/2010)  Problem # 3:  HYPERLIPIDEMIA (ICD-272.4)  Her updated medication list for this problem includes:    Simvastatin 10 Mg Tabs (Simvastatin) ..... One by mouth once daily  Complete Medication List: 1)  Simvastatin 10 Mg Tabs (Simvastatin) .Marland KitchenMarland KitchenMarland Kitchen  One by mouth once daily 2)  Hydrochlorothiazide 25 Mg Tabs (Hydrochlorothiazide) .... One by mouth once daily 3)  Omeprazole 20 Mg Tbec (Omeprazole) .... Take 1 tablet by mouth two times a day 4)  Metformin Hcl 750 Mg Xr24h-tab (Metformin hcl) .... One by mouth bid 5)  Aspirin 81 Mg Tabs (Aspirin) .... Take 1 tab once daily 6)  Bd U/f Short Pen Needle 31g X 8 Mm Misc (Insulin pen needle) .... Use two times a day as directed 7)  Bystolic 5 Mg Tabs (Nebivolol hcl) .... One by mouth once daily 8)  Tradjenta 5 Mg Tabs (Linagliptin) .... One by mouth once daily 9)  Truetrack Blood Glucose W/device Kit (Blood glucose monitoring suppl) .... Use to test blood sugar (250.00) 10)  Truetrack Test Strp (Glucose blood) .... Use to test blood sugar three times a day (250.00) 11)  Lancet  Device Misc (Lancet devices) .... Use to test blood sugar three times a day (250.00) 12)  Qvar 80 Mcg/act Aers (Beclomethasone dipropionate) .... 2 puffs two times a day   Patient Instructions: 1)  Please schedule a follow-up appointment in 3 months. 2)  BMP prior to visit, ICD-9:  401.9 3)  HbgA1C prior to visit, ICD-9:  250.00 4)  Hepatic Panel prior to visit, ICD-9: 272.4 5)  Lipid Panel prior to visit, ICD-9: 272.4 6)  Please return for lab work one (1) week before your next appointment.  Prescriptions: TRADJENTA 5 MG TABS (LINAGLIPTIN) one by mouth once daily  #90 x 1   Entered and Authorized by:   D. Thomos Lemons DO   Signed by:   D. Thomos Lemons DO on 11/01/2010   Method used:   Electronically to        [facility]* (retail)       [address]       [address] Shipping/mailing       [place], [place]  [address]       Ph: [phone]       Fax: [phone]   RxID:   [id number] BYSTOLIC 5 MG TABS (NEBIVOLOL HCL) one by mouth once daily  #90 x 1   Entered and Authorized by:   D. Thomos Lemons DO   Signed by:   D. Thomos Lemons DO on 11/01/2010   Method used:   Electronically to        [facility]* (retail)       [address]       [address] Shipping/mailing       [place], [place]  [address]       Ph: [phone]       Fax: [phone]   RxID:   [id number] OMEPRAZOLE 20 MG  TBEC (OMEPRAZOLE) Take 1 tablet by mouth two times a day  #180 x 1   Entered and Authorized by:   D. Thomos Lemons DO   Signed by:   D. Thomos Lemons DO on 11/01/2010   Method used:   Electronically to        [facility]* (retail)       [address]       [address] [place] [address]mailing       [place], [place]  [address]       Ph: [phone]       Fax: [phone]   RxID:   [id number] METFORMIN HCL 750 MG XR24H-TAB (METFORMIN HCL) one by mouth bid  #180  x 1   Entered and Authorized by:   D. Thomos Lemons DO   Signed by:   D. Thomos Lemons  DO on 11/01/2010   Method used:   Electronically to        [facility]* (retail)       [address]       [address] [place] [address]mailing       [place], [place]  [address]       Ph: [phone]       Fax: [phone]   RxID:   [id number] HYDROCHLOROTHIAZIDE 25 MG  TABS (HYDROCHLOROTHIAZIDE) one by mouth once daily  #90 x 1   Entered and Authorized by:   D. Thomos Lemons DO   Signed by:   D. Thomos Lemons DO on 11/01/2010   Method used:   Electronically to        [facility]* (retail)       [address]       [address] Shipping/mailing       [place], [place]  [address]       Ph: [phone]       Fax: [phone]   RxID:   [id number] SIMVASTATIN 10 MG TABS (SIMVASTATIN) one by mouth once daily  #90 x 1   Entered and Authorized by:   D. Thomos Lemons DO   Signed by:   D. Thomos Lemons DO on 11/01/2010   Method used:   Electronically to        [facility]* (retail)       [address]       [address] [place] [address]mailing       [place], [place]  [address]       Ph: [phone]       Fax: [phone]   RxID:   [id number] QVAR 80 MCG/ACT AERS (BECLOMETHASONE DIPROPIONATE) 2 puffs two times a day  #1 month x 5   Entered and Authorized by:   D. Thomos Lemons DO   Signed by:   D. Thomos Lemons DO on 11/01/2010   Method used:   Electronically to        [facility]* (retail)       [address]       [address] Shipping/mailing       [place], [place]  [address]       Ph: [phone]       Fax: [phone]   RxID:   [id number]    Orders Added: 1)  Est. Patient Level III [67893]    Current Allergies (reviewed today): No known allergies

## 2011-01-17 ENCOUNTER — Ambulatory Visit: Payer: 59 | Admitting: Internal Medicine

## 2011-02-01 NOTE — Procedures (Signed)
Eileen Richards, Eileen Richards NO.:  [id number]   MEDICAL RECORD NO.:  [id number]          PATIENT TYPE:  OUT   LOCATION:  SLEEP CENTER                 FACILITY:  [facility]   PHYSICIAN:  Barbaraann Share, MD,FCCPDATE OF BIRTH:  07-Feb-1963   DATE OF STUDY:  02/02/2007                            NOCTURNAL POLYSOMNOGRAM   REFERRING PHYSICIAN:  Barbette Hair. Artist Pais, DO   INDICATION FOR STUDY:  Hypersomnia with sleep apnea.   EPWORTH SLEEPINESS SCORE:  14   MEDICATIONS:   SLEEP ARCHITECTURE:  The patient had a total sleep time of 324 minutes  with adequate falling asleep with decreased REM.  Sleep onset latency  was normal, and REMx onset was prolonged. Sleep deficiency was mildly  decreased.   RESPIRATORY DATA:  The patient was found to have 37 hypopnea and 27  apneas for an apnea/hypopnea index of 12 events per hour.  The events  were not positional but were clearly worse during REMx.  Moderate  snoring was noted throughout.   OXYGEN DATA:  There was O2 desaturation as low as 86% with the patient's  obstructive events.   CARDIAC DATA:  No clinically significant cardiac arrhythmias were noted.   MOVEMENT-PARASOMNIA:  The patient was found to have 13 leg jerks with  less than 1 per hour resulting in arousal or awakening.   IMPRESSIONS-RECOMMENDATIONS:  Mild obstructive sleep apnea/hypopnea  syndrome with an apnea/hypopnea index of 12 events per hour and O2  desaturation as low as 86%.  The events were clearly worse during REMx.  Treatment for this degree of sleep apnea can include weight loss alone  if applicable, upper airway surgery, oral appliance, and also CPAP.  Clinical correlation is suggested.     Barbaraann Share, MD,FCCP  Diplomate, American Board of Sleep  Medicine  Electronically Signed    [facility]/MEDQ  D:  02/09/2007 08:59:18  T:  02/09/2007 09:45:06  Job:  [id number]

## 2011-02-01 NOTE — Op Note (Signed)
NAMEJATAYA, WANN            ACCOUNT NO.:  [id number]   MEDICAL RECORD NO.:  [id number]          PATIENT TYPE:  INP   LOCATION:  9302                          FACILITY:  [facility]   PHYSICIAN:  Maxie Better, M.D.DATE OF BIRTH:  01/24/1961   DATE OF PROCEDURE:  07/09/2008  DATE OF DISCHARGE:                               OPERATIVE REPORT   PREOPERATIVE DIAGNOSES:  Menorrhagia, uterine fibroids.   POSTOPERATIVE DIAGNOSES:  Menorrhagia, uterine fibroids.   PROCEDURES:  Diagnostic laparoscopy, exploratory laparotomy, total  abdominal hysterectomy, lysis of adhesions, and myomectomy.   ANESTHESIA:  General.   SURGEON:  Maxie Better, MD.   ASSISTANTCordelia Pen A. Dickstein, MD.   DESCRIPTION OF PROCEDURE:  Under adequate general anesthesia, the  patient was placed in the dorsal lithotomy position.  She was sterilely  prepped and draped for planned laparoscopic assisted vaginal  hysterectomy.   PROCEDURE:  Examination under anesthesia revealed an uterus was  irregular about 10-week size, but with limited descent on vaginal exam.  Nonetheless, after the patient was sterilely prepped and draped, an  indwelling Foley catheter was sterilely placed.  Bivalve speculum was  placed in the vagina.  Single-tooth tenaculum was placed on the anterior  lip of the cervix.  An acorn cannula was then introduced into the  cervical os and attached to the tenaculum for manipulation of the  uterus.  The bivalve speculum was removed.  Attention was then turned to  the abdomen.  A 0.25% Marcaine was injected infraumbilically.  Infraumbilical incision was then made.  Veress needle was introduced and  placement was tested with saline.  Carbon dioxide was insufflated, about  2 liters of CO2 was done.  Veress needle was removed.  A disposable 10-  mm port was introduced into that incision.  A lighted video laparoscope  was introduced through that site and inspection of the pelvis was  started.   Upper abdomen had thin omental fat, showed normal liver edge.  On the right mid lower quadrant, 0.25% Marcaine was again injected.  A  small incision was made and a 5-mm port was placed under direct  visualization and a probe was then utilized to that port for assessment.  On viewing the uterus, initially appeared to have been almost like a  bicornuate uterus.  However, it was then realized that the right cornual  area contained a fibroid.  The uterus was irregularly shaped and was  broad based.  The left adnexa could not be visualized well.  The ovary  was below the fibroid and also limited mobility visualization.  Given  the findings of the laparoscopy, the decision was made to forego  laparoscopic procedure and I opened the patient.  The patient remained  in the dorsal lithotomy position.  The umbilical sites, the ports were  removed.  The skin incision was closed with a 4-0 Vicryl subcuticular  stitch.  The fascial edge could not reach the infraumbilical site and  the right lower quadrant site was closed with Dermabond.  The  instruments from the vagina was removed and the patient was then  prepared for an exploratory laparotomy.  A 0.25% Marcaine was injected  along the planned Pfannenstiel skin incision site.  A Pfannenstiel skin  incision was then made, carried down through the rectus fascia.  The  rectus fascia was then opened transversely.  The rectus fascia was then  bluntly and sharply dissected off the rectus muscle in superior and  inferior fashion.  The rectus muscle was then split in the midline.  The  parietal peritoneum was entered sharply and extended.  Omentum was  noted.  Upper liver edge was palpated was normal, and was visualized  with laparoscope.  Attention was then turned to the uterus.  It was then  noted that there was a pedunculated about at least 6-cm fibroid with a  wide-based stalk posteriorly on the right confirmed was a fibroid right  underneath the  utero-ovarian ligament and another fibroid just below  that along the side where the uterine vessels would be basically be  located.  There was also fundal fibroid.  There was addition of smaller  fibroids in the lower uterine segment and on the left, there was  adhesions around the fallopian tube and ovary, some of which were  adjacent to  bowels and lateral side wall.  These were lysed.  The  bowels were packed upwardly.  Self-retaining Balfour retractor was then  placed.  The uterus was attempted to be exteriorized, but limited  mobility.  The round ligament on the right was identified and suture  ligated with 0 Vicryl x2 and opened with a cautery.  The anterior leaf  of the broad ligament was opened partially and the posterior leaf was  opened bluntly.  The uterosacral ligament was then doubly clamped, cut,  free tied with 0 Vicryl, and suture ligated with 0 Vicryl..  When that  was done, attention was then turned to the contralateral side where  additional lysis of adhesions was performed around the left tube and  ovary.  The round ligament was identified.  Also, suture ligated x2 and  opened and the anterior/posterior leaf of the broad ligament was then  opened after additional dissection of the anterior leaf was done.  The  left utero-ovarian ligament was doubly clamped, cut, free tied with 0  Vicryl, and suture ligated with 0 Vicryl.  With that being done, the  anterior leaf of the vesicouterine peritoneum was opened transversely  and the sharp and blunt dissection then performed  for the bladder to be  displaced inferiorly.  Attention was then turned back to the right side.  The fibroids were again inspected along the lateral wall.  It was then  noted that we could see with some skeletization being performed at the  uterine vessels.  At that point, the decision was made to defer doing a  myomectomy first and that gives lateral fibroids and proceed with  clamping of the uterine  vessels doubly clamped, cut, and suture ligated  with 0 Vicryl x2.  When that was done, attention then was turned back to  the left side.  The left side, however, was difficult to visualize with  respect to uterine vessels.  An incision was then made to perform a  myomectomy in order to shrink the volume of the uterus in order to  further facilitate the surgery.  Dilute solution of Pitressin was  injected over the anterior fundal area.  Incision was then made with  cautery.  Fibroids enucleated from that base.  Lower down on the left,  there was also another fibroid similar  in position to the one on the  right and this was then removed as well using the same technique.  With  that being done, the uterine vessel site was better seen and uterine  vessels were skeletonized on the left.  The vessels were then doubly  clamped, cut, and suture ligated with 0 Vicryl x2.  The uterus was noted  to be blanching either from the loss of the blood supply and/or with the  Pitressin solution that had been injected.  Decision was then made to  truncate the body of the uterus and that was done to facilitate further  visualization of the field.  The cervical portion was then grasped.  The  cardinal ligaments were then serially clamped, cut, and suture ligated  with 0 Vicryl.  The uterosacral was identified bilaterally.  They were  individually clamped, cut, and suture ligated with 0 Vicryl.  The  bladder was continued to be pushed inferiorly.  The cervicovaginal angle  was reached with the clamping of these cardinal ligaments, the angles  were then secured with 0-Vicryl sutures.  The cervix was then detached  from its vaginal attachment circumferentially.  The vaginal cuff was  then oversewn with 0-Vicryl running locked stitch and then closed with 0-  Vicryl interrupted figure-of-eight sutures.  Some bleeding was noted  where the bladder reflection was and this was cauterized.  The appendix  was  incidentally noted to be normal.  The abdomen was then copiously  irrigated and suctioned of debris.  There was a bleeding noted on the  right angle anteriorly, which was identified and cauterized.  The  ureters were palpated to be of normal caliber bilaterally and deep in  the pelvis.  The uterosacral ligament was suspended to the vaginal cuff  angle bilaterally.  The right pedicle of the ovary and tube was  suspended to the right round ligament.  On the left, this was not  suspended due to the continued scarring of adhesions of the tubes and  ovaries to the left, to some of the bowel and that was not suspended.  Once good hemostasis was noted, the bowel package were removed.  The  retractor was removed.  The parietal peritoneum was not closed and the  rectus fascia with 0 Vicryl x2.  The subcutaneous area was irrigated.  Small bleeders were cauterized and interrupted 2-0 plain sutures were  placed and the skin approximated using staples.  Specimen was the myomas  x2, the body of the uterus with  a pedunculated fibroid and the cervix  all sent to pathology.  The weight of the specimen was probably 546  grams .  Estimated blood loss was  650 mL.  Urine output was 575 mL.  Intraoperative fluid was 2800 mL.  Sponge and instrument counts x3 was correct.  Complication was none.  The patient tolerated the procedure well and was transferred to the  recovery in stable condition.      Maxie Better, M.D.  Electronically Signed     [place]/MEDQ  D:  07/09/2008  T:  07/10/2008  Job:  [id number]

## 2011-02-04 NOTE — Discharge Summary (Signed)
Eileen Richards, Eileen Richards            ACCOUNT NO.:  [id number]   MEDICAL RECORD NO.:  [id number]          PATIENT TYPE:  INP   LOCATION:  6529                         FACILITY:  [facility]   PHYSICIAN:  Madaline Savage, MD        DATE OF BIRTH:  1960/11/24   DATE OF ADMISSION:  10/25/2006  DATE OF DISCHARGE:  10/26/2006                               DISCHARGE SUMMARY   PRIMARY CARE PHYSICIAN:  She does not have a primary care physician at  this time.   CONSULTATIONS IN THE HOSPITAL:  Dr. Samule Ohm saw the patient from [facility]  Cardiology.   DISCHARGE DIAGNOSES:  1. Atypical chest pain, ruled out acute coronary syndrome.  2. Elevated blood pressure, new onset hypertension.  3. Hyperlipidemia.  4. Depression.   DISCHARGE MEDICATIONS:  1. Aspirin 81 mg once daily.  2. Lopressor 25 mg twice daily.  3. Zocor 40 mg once daily.  4. Wellbutrin as before.   PROCEDURES DONE IN THE HOSPITAL:  She had an x-ray of chest, portable,  done on the 6th of February, 2008, which showed no acute cardiopulmonary  disease.   HISTORY OF PRESENT ILLNESS:  For a full history and physical, see the  history and physical dictated by Dr. Corky Downs on October 25, 2006.  In  short, Ms. Soucy is a 50 year old lady with a history of depression  who came with complaints of chest pain.  Her chest pain started after  she was running on a treadmill and lasted approximately an hour.  She  describes it as a discomfort 8/10.  She has not had this pain before and  after coming here, she has not had this pain again.  Her initial  evaluation with an EKG and cardiac enzymes were negative and she was  admitted for further evaluation.   PROBLEM LIST:  1. Atypical chest pain.  She had 2 sets of cardiac enzymes, which were      negative.  She also had an EKG, which was normal sinus rhythm.  We      consulted cardiology, on-call from [facility], who agreed      that she needs a stress test as an outpatient.  Her stress test  is      scheduled on the 11th of February at 12:30 p.m. at the [facility].  2. Hypertension.  Her blood pressure was elevated while she was in the      hospital.  Her systolic blood pressures were 811B-147W.  She was      started on Lopressor and she will continue on Lopressor as an      outpatient.  3. Hyperlipidemia.  She had a cholesterol panel done in the hospital,      which showed a total cholesterol of 186, triglycerides of 186, HDL      of 28 and LDL of 295.  With her markedly low HDL and elevated      triglycerides, we decided to start her on a statin, which can be      titrated as an outpatient.  4.  Depression.  For which she will continue on Wellbutrin as before.   DISPOSITION:  She is now being discharged home in a stable condition.   FOLLOWUP:  She has a stress test on [address] [employer].  She  was given the phone number [phone] to call to confirm her appointment.  Her appointment is on Monday, February 11 at 12:30 p.m.  She will also  need to follow up with a primary care physician and she has been advised  to find a primary care doctor and follow in 2-3 weeks.      Madaline Savage, MD  Electronically Signed     [facility]/MEDQ  D:  10/26/2006  T:  10/27/2006  Job:  [id number]

## 2011-02-04 NOTE — H&P (Signed)
NAMEPRESSLEY, BARSKY            ACCOUNT NO.:  [id number]   MEDICAL RECORD NO.:  [id number]          PATIENT TYPE:  INP   LOCATION:  1823                         FACILITY:  [facility]   PHYSICIAN:  Mobolaji B. Bakare, M.D.DATE OF BIRTH:  08/23/1961   DATE OF ADMISSION:  10/25/2006  DATE OF DISCHARGE:                              HISTORY & PHYSICAL   PRIMARY CARE PHYSICIAN:  Unassigned.   CHIEF COMPLAINT:  Chest pain.   HISTORY OF PRESENT ILLNESS:  Mrs. Dardis is a 50 year old African  American female who started exercising two weeks ago.  She joined to gym  and uses a treadmill.  Today while at the gym on the treadmill, she  developed left-sided chest pain which lasted approximately one hour.  The pain was nonradiating, not associated with shortness of breath or  diaphoresis or palpitations.  She quit using the treadmill and went  home.  The pain persisted and she decided to go to Urgent Care.  She was  sent to the emergency room from Urgent Care.  A 2-D echocardiogram  showed normal sinus rhythm.  A full set of cardiac enzymes was normal.  The patient is currently chest pain free.  She will be admitted to rule  out myocardial infarction.  Known risk factors include tobacco abuse and  obesity.  She does not have family history of premature CAD.   PAST MEDICAL HISTORY:  Tobacco abuse.  She is on Wellbutrin for this and  has undergone tobacco cessation counseling at [facility].   PAST SURGICAL HISTORY:  Gynecological procedure which she describes as  D&C.   CURRENT MEDICATIONS:  Wellbutrin.   ALLERGIES:  No known drug allergies.   FAMILY HISTORY:  Both parents are alive.  Father has hypertension.  Mother has diabetes and hypertension.   SOCIAL HISTORY:  The patient smokes cigarettes.  She has been trying to  cut back currently. She is smoking less than half a pack a day.  She  does not drink alcohol.  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  The patient is single.  Has no  children.   PHYSICAL EXAMINATION:  VITAL SIGNS:  Temperature 98.1, blood pressure  172/101.  This has been rechecked twice.  Blood pressure is now 120/84  with heart rate of 87, oxygen saturation 97% on room air.  GENERAL:  On examination, the patient is comfortable.  Not in  respiratory distress.  She is obese.  HEENT: Normocephalic, atraumatic.  No pale icterus.  LUNGS:  Clear to auscultation.  CARDIOVASCULAR:  S1 and S2 regular.  No murmur, no gallop.  ABDOMEN:  Obese, soft, nontender.  Bowel sounds present.  CHEST:  Chest wall has no reproducible tenderness.  EXTREMITIES:  No pedal edema, no calf tenderness.  CNS:  No focal neurological deficit.   INITIAL LABORATORY DATA:  Cardiac markers at point of care:  Myoglobin  6.5, troponin 0.05, CK-MB 2.3.  Creatinine 0.9, sodium 126, potassium  3.6, chloride 104, glucose 103, BUN 12, bicarb 28.8.  Hemoglobin 15.3,  hematocrit 45.  EKG shows normal sinus rhythm without acute  ST-T  changes.  Chest x-ray showed no acute cardiopulmonary findings.   ASSESSMENT/PLAN:  1. Mrs. Starkel is a 50 year old African American female presenting      with chest pain on exertion without any associated symptoms. She is      currently chest pain free.  Known risk factors include obesity and      cigarette smoking.  She will be admitted to telemetry. Will cycle      cardiac enzymes and give nitroglycerin 0.4 mg sublingual p.r.n.,      fasting lipid profile, hemoglobin A1C, aspirin 325 mg daily.      Lovenox 1 mg/kg subcu q.12h. until she is ruled out for myocardial      infarction.  Lopressor 12.5 mg p.o. b.i.d.  2. Tobacco abuse.  Ongoing tobacco abuse. Will continue Wellbutrin and      also offer nicotine patch and tobacco cessation counseling.  3. Obesity.  Will offer weight loss counseling to reinforce lifestyle      modification.  4. High blood pressure.  Will monitor her during this hospitalization.       The patient may need antihypertensive.      Mobolaji B. Corky Downs, M.D.  Electronically Signed     MBB/MEDQ  D:  10/26/2006  T:  10/26/2006  Job:  [id number]

## 2011-02-04 NOTE — Op Note (Signed)
Eileen Richards, Eileen Richards            ACCOUNT NO.:  [id number]   MEDICAL RECORD NO.:  [id number]          PATIENT TYPE:  AMB   LOCATION:  SDC                           [facility]  [facility]   PHYSICIAN:  Maxie Better, M.D.DATE OF BIRTH:  28-Apr-1961   DATE OF PROCEDURE:  02/04/2005  DATE OF DISCHARGE:                                 OPERATIVE REPORT   PREOPERATIVE DIAGNOSES:  1.  Menometrorrhagia.  2.  Endometrial mass.   POSTOPERATIVE DIAGNOSES:  1.  Menometrorrhagia.  2.  Endometrial masses.   PROCEDURE:  Diagnostic hysteroscopy, resection of endometrial polyps,  dilation and curettage.   ANESTHESIA:  General, paracervical block.   SURGEON:  Maxie Better, M.D.   PROCEDURE:  Under adequate general anesthesia, the patient was placed in a  dorsal lithotomy position.  She was sterilely prepped and draped in the  usual fashion.  The bladder was catheterized for a moderate amount of urine.  Examination under anesthesia revealed an anteflexed uterus, irregular, about  eight to 10 weeks' size.  No adnexal masses could be appreciated.  A bivalve  speculum was placed in the vagina, a single-tooth tenaculum was placed on  the anterior lip of the cervix.  1% Nesacaine 10 mL was injected  paracervically.  The cervix was then serially dilated up to a #31 Pratt  dilator.  The resectoscope with the double loop was attempted to be  introduced into the uterine cavity, however multiple times were  unsuccessful.  The single diagnostic hysteroscope was then attempted to be  inserted and once that was inserted, polypoid lesions were noted.  The  diagnostic hysteroscope was removed.  The cavity was then re-dilated up to a  #33 Pratt dilator.  The resectoscope with the double loop was then  reinserted and polypoid lesions were noted circumferentially in the  endometrial cavity.  The left tubal ostium was seen.  The right was not  seen.  Resection of the endometrial polyps was then performed, at  which time  the resectoscope was then removed.  The endocervical canal did not show any  polypoid lesions.  The cavity was then curetted.  The resectoscope was then  reinserted and the cavity inspected, no polypoid lesions noted, at which  time all instruments from the vagina were then removed.  A specimen labeled,  endometrial curettings and endometrial polyps was sent to pathology.  Estimated blood loss was minimal.  Complication was none.  The patient  tolerated the procedure well, was transferred to the recovery room in stable  condition.  Fluid deficit was 100 mL sorbitol.      [place]/MEDQ  D:  02/04/2005  T:  02/05/2005  Job:  [id number]

## 2011-02-04 NOTE — Consult Note (Signed)
Eileen Richards, Eileen Richards            ACCOUNT NO.:  [id number]   MEDICAL RECORD NO.:  [id number]          PATIENT TYPE:  INP   LOCATION:  6529                         FACILITY:  [facility]   PHYSICIAN:  Salvadore Farber, MD  DATE OF BIRTH:  November 02, 1960   DATE OF CONSULTATION:  10/26/2006  DATE OF DISCHARGE:  10/26/2006                                 CONSULTATION   CARDIOLOGY CONSULTATION:   PRIMARY CARDIOLOGIST:  New, Dr. Samule Ohm.   PRIMARY CARE PHYSICIAN:  None.   HISTORY OF PRESENT ILLNESS:  This is a 50 year old African-American  female who began feeling tingling chest pain and left-sided chest  discomfort while working out walking on a treadmill.  The patient had  been working out for approximately 30 minutes and began to feel the  discomfort on the left side of her chest, non-radiating, with no  associated increase in shortness of breath, diaphoresis.  There was no  dizziness, nausea, vomiting, or presyncope.  The patient stopped walking  on the treadmill and called her daughter who brought her to an urgent  care center.  The patient states that she has had a headache all day  prior to going to the gym.  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  When she went to the urgent care center, she was told that  she was hypertensive, she was given 4 baby aspirin and sent to the  emergency room.  On arrival to the emergency room, the patient's initial  blood pressure was found to be 179/101.  The patient was admitted to  rule out MI and placed on telemetry.  The patient's initial point-of-  care troponin was negative and troponin per lab was 0.02 with a CK-MB of  3.0 and a CK of 169.  The patient's EKG revealed normal sinus rhythm  with no acute changes.  Since admission, the patient has not experienced  any further chest pain, but she has noticed some tingling on the left  side on occasion lasting a few  seconds.   PAST MEDICAL HISTORY:  Negative.   PAST SURGICAL HISTORY:  A D&C in the past.   FAMILY HISTORY:  Mother with diabetes and hypertension, father with  hypertension.   SOCIAL HISTORY:  She is a 28-year smoker, half pack a day, negative for  ETOH.  She works in Public affairs consultant at [employer], she  lives in [place], and she is single.   CURRENT MEDICATIONS AT HOME:  Wellbutrin 150 mg b.i.d. for smoking  cessation.   ALLERGIES:  NO KNOWN DRUG ALLERGIES.   HOSPITAL MEDICATIONS:  1. Zocor 40 mg q.h.s.  2. Lopressor 25 mg b.i.d.  3. Lovenox 75 mg subcu q.12h.  4. Nicotine patch 14 mg daily.  5. Enteric-coated aspirin 325 mg daily.  6. Wellbutrin 150 mg b.i.d.  7. Nitroglycerin p.r.n.  8. Protonix 40 mg once a day.   LABS:  Hemoglobin 13.7, hematocrit 40.2, white blood cells 11.8,  platelets 390.  Sodium 136, potassium 3.6, chloride 104, BUN  12,  creatinine 0.9, glucose 103, hemoglobin Alc 6.1.  EKG revealing normal  sinus rhythm without evidence of ischemia.  Cholesterol 186, LDL 121,  HDL 28, triglycerides 186.   PHYSICAL EXAM:  VITAL SIGNS:  Blood pressure 149/96, heart rate 79,  respirations 18, weight 101.5 kg.  HEENT:  Head is normocephalic, atraumatic.  Eyes:  PERRLA.  Mucous  membranes in mouth are pink and moist.  Tongue is midline.  NECK:  Supple.  There is no thyromegaly noted.  There is no JVD.  No  carotid bruits are appreciated.  CARDIOVASCULAR:  Regular rate and rhythm without murmurs, rubs, or  gallops.  LUNGS:  Clear to auscultation.  ABDOMEN:  Obese, nontender, with 2+ bowel sounds.  EXTREMITIES:  With mild non-pitting edema, dorsalis pedis pulses 1+  bilaterally, radial pulses 1+ bilaterally.  SKIN:  Warm and dry.  NEURO:  Intact.   IMPRESSION:  1. Chest pain, rule out cardiac etiology.  2. Hypertension.  3. Tobacco abuse.  4. Obesity.  5. Hypercholesterolemia.  Cardiovascular risk factors:  Obesity,      smoking,  hypercholesterolemia, and hypertension.   The patient has been seen and examined by Dr. Randa Evens who agrees  with assessment.  Patient's pain is described as atypical.  We will  follow up with second troponin, and if it is negative, would discharge  home and follow up with a Myoview stress test.  Would continue aspirin  and beta blockers, continue for hypertension.  Continue Statin secondary  to hypercholesterolemia, and tobacco cessation is encouraged.  This has  been explained to the patient by Dr. Samule Ohm, and the patient verbalizes  understanding and is wanting to proceed with Cardiolite stress test.  Cardiolite stress test has been scheduled for February 11 at 12:30 p.m.  at out [facility].      Bettey Mare. Lyman Bishop, NP      Salvadore Farber, MD  Electronically Signed    KML/MEDQ  D:  10/26/2006  T:  10/27/2006  Job:  [id number]   cc:   Mobolaji B. Corky Downs, M.D.

## 2011-02-04 NOTE — Assessment & Plan Note (Signed)
[facility]                           PRIMARY CARE OFFICE NOTE   Eileen Richards, ALCIVAR                     MRN:          [id number]  DATE:12/26/2006                            DOB:          27-Jan-1961    CHIEF COMPLAINT:  New patient to practice.   HISTORY OF PRESENT ILLNESS:  The patient is a 50 year old  African/American female, here to establish primary care.  She has not  had a primary care physician in the past.  She is noted to have been  hospitalized on October 25, 2006, secondary to chest pain.  She had  negative cardiac enzymes x3 and followed up with Dr. Salvadore Farber  regarding a stress test, which is noted by the patient to be normal.  Since that time she was put on an antihypertensive metoprolol 25 mg  b.i.d. and also Zocor for elevated cholesterol.  She has been a long-  time smoker since age 43.  She was averaging 1/2 pack to 1 pack per day.  They referred her to a smoking cessation program at [facility], with the help of patches, and she has been smoke-free.  Her  chest symptoms have resolved.  She denies any chest symptoms.  She  denies any shortness of breath.   Her main complaint today has been a chronic headache.  This has been  ongoing for several months.  She was told in the past that it may be  migraine.  Occasionally she does have some photophobia; however, she has  headaches early in the morning.  There is a report of snoring and  daytime somnolence.  She has no neurologic deficits associated with  headache.  She denies any worsening of symptoms with bending forward or  with cough.  Currently the headaches are somewhat mild.   PAST MEDICAL HISTORY:  1. Atypical chest pain.  2. Tobacco abuse.  3. Hypertension.  4. Obesity.   PAST SURGICAL HISTORY:  A previous D&C.   CURRENT MEDICATIONS:  1. Zocor 40 mg at bedtime.  2. Omeprazole 20 mg b.i.d.  3. Metoprolol 25 mg b.i.d.   ALLERGIES:  No known  drug allergies.   SOCIAL HISTORY:  The patient is single.  She has never been married.  She does not have any children.  HABITS:  No alcohol.  Tobacco use as  noted above.  She currently works at [employer].   FAMILY HISTORY:  Her mother, [name], is a patient here at [facility].  The mother is noted to have hypertension, hepatitis-C and  type 2 diabetes.  Father is noted to be hypertensive.  She denies any  family history of cancer.  She has a sister and a brother who are  healthy.   REVIEW OF SYSTEMS:  No HEENT symptoms.  No chest pain.  No heartburn.  No nausea, vomiting, constipation, diarrhea.  Mild raspy voice which is  chronic.  All other systems negative.   PHYSICAL EXAMINATION:  VITAL SIGNS:  Weight 230 pounds, temperature 98.5  degrees, pulse 80, blood pressure  131/83 in the left arm in the seated  position.  GENERAL:  The patient is a pleasant obese 50 year old African/American  female, who appears slightly older than her stated age.  HEENT:  Normocephalic and atraumatic.  Pupils equal, react to light  bilaterally.  Extraocular motility was intact.  The patient was  anicteric.  Conjunctivae within normal limits.  Ear canals and tympanic  membranes were clear bilaterally.  Oropharyngeal exam was unremarkable.  NECK:  Supple, no adenopathy or carotid bruits or thyromegaly. Positive  acanthosis nigricans.  CHEST:  Normal respiratory effort.  The chest is clear to auscultation  bilaterally.  No rhonchi, rales or wheezing.  CARDIOVASCULAR:  A regular rate and rhythm.  No significant murmurs,  rubs or gallops appreciated.  ABDOMEN:  Soft, nontender.  Positive bowel sounds.  No organomegaly.  MUSCULOSKELETAL:  No clubbing, cyanosis or edema.  SKIN:  Warm and dry.  NEUROLOGIC:  Cranial nerves II-XII  grossly intact.  She was nonfocal.   IMPRESSION:  1. Chronic headache, suspect secondary to obstructive sleep  apnea.  2. Hypertension, stable.  3. History of tobacco abuse, now on nicotine patch.  4. Obesity with family history of type 2 diabetes.  5. Health maintenance.   RECOMMENDATIONS:  1. The patient will be referred for a sleep study to assess for      obstructive sleep apnea.  We will also add a sedimentation rate.  I      doubt her headaches are secondary to vasculitis.  2. In terms of her blood pressure, she is to continue her metoprolol      25 mg b.i.d.  She occasionally complains of lower extremity      swelling at the end of the day.  We added low-dose      hydrochlorothiazide 12.5 mg p.o. q.a.m.  We will obtain a follow-up      BMET within two to four weeks.  3. In terms of her hyperlipidemia, we will obtain follow-up liver      function tests and lipids.  We discussed a low-cholesterol diet and      she was provided with a pamphlet.  In addition, we      discussed a weight loss goal of approximately 10% of her body      weight with an exercise plan.  Discussed basic weight loss      strategy.   FOLLOWUP:  The followup time is in approximately four to six weeks.     Barbette Hair. Artist Pais, DO  Electronically Signed    RDY/MedQ  DD: 12/26/2006  DT: 12/26/2006  Job #: [id number]

## 2011-02-04 NOTE — Discharge Summary (Signed)
Eileen Richards, Eileen Richards            ACCOUNT NO.:  [id number]   MEDICAL RECORD NO.:  [id number]          PATIENT TYPE:  INP   LOCATION:  9302                          [facility]ACILITY[facility]  [facility]   PHYSICIAN:  Maxie Better, M.D.DATE OF BIRTH:  [birth year]   DATE OF ADMISSION:  07/09/2008  DATE OF DISCHARGE:  07/11/2008                               DISCHARGE SUMMARY   j   ADMISSION DIAGNOSES:  1. Menorrhagia.  2. Uterine fibroids.   DISCHARGE DIAGNOSES:  1. Menorrhagia.  2. Uterine fibroids.  3. Pelvic adhesions.   PROCEDURES:  1. Diagnostic laparoscopy.  2. Exploratory laparotomy.  3. Total abdominal hysterectomy with myomectomy.  4. Lysis of adhesions.   HOSPITAL COURSE:  Eileen Richards was admitted and taken to the operating  room for planned attempted laparoscopic-assisted vaginal hysterectomy.  Given the findings of the fibroids, the decision was made to open the  patient and perform exploratory laparotomy and total abdominal  hysterectomy.  Myomectomy was necessary not to facilitate the  hysterectomy.  Please see the dictated operative report for all specific  details.  The patient subsequently had removal of a fibroid uterus that  weighed 542 grams.  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  Most of the tubes and ovaries  remained.  Postoperative course was notable for large amount of drainage  from her umbilical incision.  The remaining incision for the laparotomy  was otherwise unremarkable.  CBC  and BMET was performed.  Hematocrit  was 35.5, hemoglobin 11.5, white count is 15.2, and platelet count  230,000.  Her creatinine was 0.68 on July 07, 2008 and July 10, 2008, was 0.75.  The drains subsequently abated.  By postop day #2, the  patient had a bowel movement and flatus.  Her incision had no erythema,  induration, or exudate.  Her pathology result was reviewed with her.  She was deemed well to be discharged home.   DISPOSITION:  Home.   CONDITION:  Stable.   DISCHARGE MEDICATIONS:  1. Tylox 1-2 tablets every 3-4 hours p.r.n. pain.  2. Motrin 800 mg every 6-8 hours p.r.n. pain.   DISCHARGE INSTRUCTIONS:  Call for temperature greater or equal to 100.4,  nothing per vagina for 4-6 weeks, no heavy lifting, and not to drive for  2 weeks.  Call for increased incisional pain, drainage, or redness in  the incision site.  No straining with a bowel movement.  Nothing per  vagina for 6 weeks.   FOLLOWUP APPOINTMENT:  At [facility] in 6 weeks.      Maxie Better, M.D.  Electronically Signed     [place]/MEDQ  D:  08/17/2008  T:  08/17/2008  Job:  [id number]

## 2011-02-04 NOTE — Op Note (Signed)
NAMERANYIA, WITTING            ACCOUNT NO.:  [id number]   MEDICAL RECORD NO.:  [id number]          PATIENT TYPE:  AMB   LOCATION:  SDC                           [facility]   PHYSICIAN:  Maxie Better, M.D.DATE OF BIRTH:  30-Jul-1961   DATE OF PROCEDURE:  DATE OF DISCHARGE:                                 OPERATIVE REPORT   Audio too short to transcribe (less than 5 seconds)      [place]/MEDQ  D:  02/04/2005  T:  02/04/2005  Job:  [id number]

## 2011-02-07 ENCOUNTER — Ambulatory Visit: Payer: 59 | Admitting: Internal Medicine

## 2011-03-09 ENCOUNTER — Encounter: Payer: 59 | Attending: Internal Medicine | Admitting: *Deleted

## 2011-03-09 DIAGNOSIS — E119 Type 2 diabetes mellitus without complications: Secondary | ICD-10-CM | POA: Insufficient documentation

## 2011-03-09 DIAGNOSIS — E663 Overweight: Secondary | ICD-10-CM | POA: Insufficient documentation

## 2011-03-09 DIAGNOSIS — Z713 Dietary counseling and surveillance: Secondary | ICD-10-CM | POA: Insufficient documentation

## 2011-04-06 ENCOUNTER — Ambulatory Visit: Payer: 59 | Admitting: Internal Medicine

## 2011-04-07 ENCOUNTER — Encounter: Payer: Self-pay | Admitting: Internal Medicine

## 2011-04-07 ENCOUNTER — Ambulatory Visit (INDEPENDENT_AMBULATORY_CARE_PROVIDER_SITE_OTHER): Payer: 59 | Admitting: Internal Medicine

## 2011-04-07 DIAGNOSIS — E785 Hyperlipidemia, unspecified: Secondary | ICD-10-CM

## 2011-04-07 DIAGNOSIS — E119 Type 2 diabetes mellitus without complications: Secondary | ICD-10-CM

## 2011-04-07 DIAGNOSIS — R062 Wheezing: Secondary | ICD-10-CM

## 2011-04-07 MED ORDER — ALBUTEROL 90 MCG/ACT IN AERS: 2.0000 | INHALATION_SPRAY | Freq: Four times a day (QID) | RESPIRATORY_TRACT | Status: DC | PRN

## 2011-04-07 NOTE — Patient Instructions (Signed)
Please schedule cbc, chem7, a1c, urine microalbumin 250.0 and lipid 272.4 prior to next visit 

## 2011-04-08 LAB — BASIC METABOLIC PANEL
BUN: 14 mg/dL (ref 6–23)
CO2: 29 mEq/L (ref 19–32)
Calcium: 9.5 mg/dL (ref 8.4–10.5)
Chloride: 102 mEq/L (ref 96–112)
Creat: 0.75 mg/dL (ref 0.50–1.10)
Glucose, Bld: 179 mg/dL — ABNORMAL HIGH (ref 70–99)
Potassium: 4.5 mEq/L (ref 3.5–5.3)
Sodium: 138 mEq/L (ref 135–145)

## 2011-04-08 LAB — HEMOGLOBIN A1C
Hgb A1c MFr Bld: 7.9 % — ABNORMAL HIGH (ref <5.7)
Mean Plasma Glucose: 180 mg/dL — ABNORMAL HIGH (ref <117)

## 2011-04-08 LAB — HEPATIC FUNCTION PANEL
ALT: 17 U/L (ref 0–35)
AST: 15 U/L (ref 0–37)
Albumin: 3.9 g/dL (ref 3.5–5.2)
Alkaline Phosphatase: 83 U/L (ref 39–117)
Bilirubin, Direct: 0.1 mg/dL (ref 0.0–0.3)
Indirect Bilirubin: 0.2 mg/dL (ref 0.0–0.9)
Total Bilirubin: 0.3 mg/dL (ref 0.3–1.2)
Total Protein: 6.8 g/dL (ref 6.0–8.3)

## 2011-04-09 NOTE — Assessment & Plan Note (Signed)
Continue statin therapy. Obtain liver function tests. Not currently fasting. Obtain lipid profile prior to next visit

## 2011-04-09 NOTE — Assessment & Plan Note (Signed)
Mild. Provide without albuterol MDI p.r.n. Weight loss recommended. Followup if no improvement or worsening.

## 2011-04-09 NOTE — Progress Notes (Signed)
  Subjective:    Patient ID: Eileen Richards, female    DOB: 17-Nov-1960, 50 y.o.   MRN: [id number]  HPI Patient presents to clinic for evaluation of asthma. Notes recent intermittent mild wheezing. Uses inhaled steroids daily without thrush. Has no rescue inhaler. Denies recent fever chills shortness of breath or infectious trigger. Tolerate statin therapy without myalgias or abnormal LFTs. Blood pressure reviewed as normal. Blood sugars have been variable between 155 and 236 without hypoglycemia. Has gained 5 pounds since last visit. No other complaints.  Reviewed past medical history, medications and allergies    Review of Systems see history of present illness     Objective:   Physical Exam    Physical Exam  Vitals reviewed. Constitutional:  appears well-developed and well-nourished. No distress.  HENT:  Head: Normocephalic and atraumatic.  Nose: Nose normal.  Mouth/Throat: Oropharynx is clear and moist. No oropharyngeal exudate.  Eyes: Conjunctivae and EOM are normal. Pupils are equal, round, and reactive to light. Right eye exhibits no discharge. Left eye exhibits no discharge. No scleral icterus.  Neck: Neck supple. No thyromegaly present.  Cardiovascular: Normal rate, regular rhythm and normal heart sounds.  Exam reveals no gallop and no friction rub.   No murmur heard. Pulmonary/Chest: Effort normal and breath sounds normal. No respiratory distress.  has no wheezes.  has no rales.  Lymphadenopathy:   no cervical adenopathy.  Neurological:  is alert.  Skin: Skin is warm and dry.  not diaphoretic.  Psychiatric: normal mood and affect.      Assessment & Plan:

## 2011-04-09 NOTE — Assessment & Plan Note (Signed)
suboptimal control. Obtain Chem-7 and A1c. Strongly encouraged dietary modification, regular exercise and weight loss. Pending A1c results anticipate need for medication adjustment

## 2011-04-11 ENCOUNTER — Other Ambulatory Visit: Payer: Self-pay | Admitting: *Deleted

## 2011-04-11 MED ORDER — METFORMIN HCL ER 500 MG PO TB24: ORAL_TABLET | ORAL | Status: DC

## 2011-04-11 NOTE — Telephone Encounter (Signed)
Call placed to patient at [phone], she was informed of medication change and a new rx has been sent to pharmacy. Patient has verbalized understanding and agrees as instructed. She was advised to call back is she has any problems with the higher dose.

## 2011-05-26 ENCOUNTER — Inpatient Hospital Stay (INDEPENDENT_AMBULATORY_CARE_PROVIDER_SITE_OTHER)
Admission: RE | Admit: 2011-05-26 | Discharge: 2011-05-26 | Disposition: A | Discharge status: Home / Self Care | Payer: PRIVATE HEALTH INSURANCE | Source: Ambulatory Visit | Attending: Emergency Medicine | Admitting: Emergency Medicine

## 2011-05-26 ENCOUNTER — Emergency Department (HOSPITAL_COMMUNITY)
Admission: EM | Admit: 2011-05-26 | Discharge: 2011-05-26 | Disposition: A | Discharge status: Home / Self Care | Payer: PRIVATE HEALTH INSURANCE | Attending: Emergency Medicine | Admitting: Emergency Medicine

## 2011-05-26 DIAGNOSIS — E7800 Pure hypercholesterolemia, unspecified: Secondary | ICD-10-CM | POA: Insufficient documentation

## 2011-05-26 DIAGNOSIS — S058X9A Other injuries of unspecified eye and orbit, initial encounter: Secondary | ICD-10-CM | POA: Insufficient documentation

## 2011-05-26 DIAGNOSIS — H5789 Other specified disorders of eye and adnexa: Secondary | ICD-10-CM | POA: Insufficient documentation

## 2011-05-26 DIAGNOSIS — H10219 Acute toxic conjunctivitis, unspecified eye: Secondary | ICD-10-CM | POA: Insufficient documentation

## 2011-05-26 DIAGNOSIS — H538 Other visual disturbances: Secondary | ICD-10-CM | POA: Insufficient documentation

## 2011-05-26 DIAGNOSIS — IMO0002 Reserved for concepts with insufficient information to code with codable children: Secondary | ICD-10-CM | POA: Insufficient documentation

## 2011-05-26 DIAGNOSIS — H5710 Ocular pain, unspecified eye: Secondary | ICD-10-CM | POA: Insufficient documentation

## 2011-05-26 DIAGNOSIS — T2660XA Corrosion of cornea and conjunctival sac, unspecified eye, initial encounter: Secondary | ICD-10-CM

## 2011-05-26 DIAGNOSIS — Z79899 Other long term (current) drug therapy: Secondary | ICD-10-CM | POA: Insufficient documentation

## 2011-05-26 DIAGNOSIS — K219 Gastro-esophageal reflux disease without esophagitis: Secondary | ICD-10-CM | POA: Insufficient documentation

## 2011-05-26 DIAGNOSIS — I10 Essential (primary) hypertension: Secondary | ICD-10-CM | POA: Insufficient documentation

## 2011-05-26 DIAGNOSIS — E119 Type 2 diabetes mellitus without complications: Secondary | ICD-10-CM | POA: Insufficient documentation

## 2011-05-26 DIAGNOSIS — T543X1A Toxic effect of corrosive alkalis and alkali-like substances, accidental (unintentional), initial encounter: Secondary | ICD-10-CM

## 2011-06-20 LAB — CBC
HCT: 35.5 — ABNORMAL LOW
HCT: 43.3
Hemoglobin: 11.5 — ABNORMAL LOW
Hemoglobin: 14.3
MCHC: 32.4
MCHC: 33.1
MCV: 93.7
MCV: 95
Platelets: 330
Platelets: 341
RBC: 3.74 — ABNORMAL LOW
RBC: 4.62
RDW: 13.7
RDW: 14.2
WBC: 12.7 — ABNORMAL HIGH
WBC: 15.2 — ABNORMAL HIGH

## 2011-06-20 LAB — BASIC METABOLIC PANEL
BUN: 9
CO2: 28
Calcium: 8.7
Chloride: 103
Creatinine, Ser: 0.75
GFR calc Af Amer: >60
GFR calc non Af Amer: >60
Glucose, Bld: 126 — ABNORMAL HIGH
Potassium: 4.1
Sodium: 137

## 2011-06-20 LAB — GLUCOSE, CAPILLARY
Glucose-Capillary: 116 — ABNORMAL HIGH
Glucose-Capillary: 138 — ABNORMAL HIGH
Glucose-Capillary: 179 — ABNORMAL HIGH
Glucose-Capillary: 99

## 2011-06-20 LAB — COMPREHENSIVE METABOLIC PANEL
ALT: 18
AST: 18
Albumin: 3.6
Alkaline Phosphatase: 77
BUN: 11
CO2: 27
Calcium: 9.1
Chloride: 103
Creatinine, Ser: 0.68
GFR calc Af Amer: >60
GFR calc non Af Amer: >60
Glucose, Bld: 122 — ABNORMAL HIGH
Potassium: 3.8
Sodium: 136
Total Bilirubin: 0.6
Total Protein: 6.5

## 2011-06-20 LAB — PREGNANCY, URINE: Preg Test, Ur: NEGATIVE

## 2011-06-21 LAB — BODY FLUID CULTURE: Culture: NORMAL

## 2011-06-21 LAB — ANAEROBIC CULTURE

## 2011-07-18 ENCOUNTER — Encounter: Payer: Self-pay | Admitting: Internal Medicine

## 2011-07-18 ENCOUNTER — Ambulatory Visit (INDEPENDENT_AMBULATORY_CARE_PROVIDER_SITE_OTHER): Payer: PRIVATE HEALTH INSURANCE | Admitting: Internal Medicine

## 2011-07-18 DIAGNOSIS — J069 Acute upper respiratory infection, unspecified: Secondary | ICD-10-CM

## 2011-07-18 DIAGNOSIS — E119 Type 2 diabetes mellitus without complications: Secondary | ICD-10-CM

## 2011-07-18 DIAGNOSIS — E785 Hyperlipidemia, unspecified: Secondary | ICD-10-CM

## 2011-07-18 LAB — CBC
HCT: 42.4 % (ref 36.0–46.0)
Hemoglobin: 13.9 g/dL (ref 12.0–15.0)
MCH: 30.4 pg (ref 26.0–34.0)
MCHC: 32.8 g/dL (ref 30.0–36.0)
MCV: 92.8 fL (ref 78.0–100.0)
Platelets: 410 10*3/uL — ABNORMAL HIGH (ref 150–400)
RBC: 4.57 MIL/uL (ref 3.87–5.11)
RDW: 13.8 % (ref 11.5–15.5)
WBC: 10.8 10*3/uL — ABNORMAL HIGH (ref 4.0–10.5)

## 2011-07-18 LAB — BASIC METABOLIC PANEL
BUN: 13 mg/dL (ref 6–23)
CO2: 25 mEq/L (ref 19–32)
Calcium: 9.2 mg/dL (ref 8.4–10.5)
Chloride: 105 mEq/L (ref 96–112)
Creat: 0.76 mg/dL (ref 0.50–1.10)
Glucose, Bld: 111 mg/dL — ABNORMAL HIGH (ref 70–99)
Potassium: 4.4 mEq/L (ref 3.5–5.3)
Sodium: 141 mEq/L (ref 135–145)

## 2011-07-18 LAB — HEPATIC FUNCTION PANEL
ALT: 26 U/L (ref 0–35)
AST: 27 U/L (ref 0–37)
Albumin: 4.1 g/dL (ref 3.5–5.2)
Alkaline Phosphatase: 85 U/L (ref 39–117)
Bilirubin, Direct: 0.1 mg/dL (ref 0.0–0.3)
Indirect Bilirubin: 0.2 mg/dL (ref 0.0–0.9)
Total Bilirubin: 0.3 mg/dL (ref 0.3–1.2)
Total Protein: 7 g/dL (ref 6.0–8.3)

## 2011-07-18 LAB — LIPID PANEL
Cholesterol: 214 mg/dL — ABNORMAL HIGH (ref 0–200)
HDL: 34 mg/dL — ABNORMAL LOW (ref >39)
LDL Cholesterol: 137 mg/dL — ABNORMAL HIGH (ref 0–99)
Total CHOL/HDL Ratio: 6.3 Ratio
Triglycerides: 215 mg/dL — ABNORMAL HIGH (ref <150)
VLDL: 43 mg/dL — ABNORMAL HIGH (ref 0–40)

## 2011-07-18 MED ORDER — METFORMIN HCL ER 500 MG PO TB24: ORAL_TABLET | ORAL | Status: DC

## 2011-07-19 LAB — HEMOGLOBIN A1C
Hgb A1c MFr Bld: 7.4 % — ABNORMAL HIGH (ref <5.7)
Mean Plasma Glucose: 166 mg/dL — ABNORMAL HIGH (ref <117)

## 2011-07-22 ENCOUNTER — Other Ambulatory Visit: Payer: Self-pay | Admitting: Internal Medicine

## 2011-07-22 ENCOUNTER — Telehealth: Payer: Self-pay | Admitting: Internal Medicine

## 2011-07-22 NOTE — Telephone Encounter (Signed)
Refill responded to via eRx request.

## 2011-07-22 NOTE — Telephone Encounter (Signed)
Refill trutest glucose strp qty 100 last fill 10-19

## 2011-07-24 DIAGNOSIS — J069 Acute upper respiratory infection, unspecified: Secondary | ICD-10-CM | POA: Insufficient documentation

## 2011-07-24 NOTE — Assessment & Plan Note (Signed)
suboptimal control. Reinforced need to check fsbs daily and take medication regularly. Take metformin 1000mg  bid. Call with fsbs report in 3wks. Obtain cbc, chem7, a1c.

## 2011-07-24 NOTE — Assessment & Plan Note (Signed)
Suspect viral etiology. tx sx prn with otc medication. Followup if no improvement or worsening.

## 2011-07-24 NOTE — Progress Notes (Signed)
  Subjective:    Patient ID: Eileen Richards, female    DOB: Apr 25, 1961, 50 y.o.   MRN: [id number]  HPI Pt presents to clinic for followup of multiple medical problems. Notes fsbs range 180-250 without hypoglycemia. avg fsbs 180 per pt. Not checking fsbs on a regular basis. Also missing second dose of metformin . Has recent head congestion and sinus pressure developing this am. No cough, f/c or nasal drainage. No alleviating or exacerbating factors. No other complaints.  Past Medical History  Diagnosis Date  . Depression   . Headache     neg MRI of Brain 01/2007  . Sleep apnea     mild  . Diabetes mellitus   . Hypertension   . Hyperlipemia   . Atypical chest pain     normal stress echo 02/2009 -LV size was normal LV global systolic function was normal- Normal wall motion; no LV regional wall motion abnormalities  . Asthma     childhood   Past Surgical History  Procedure Date  . Abdominal hysterectomy   . Dilation and curettage of uterus     Lysis of Adhesions    reports that she has quit smoking. She has never used smokeless tobacco. She reports that she does not drink alcohol or use illicit drugs. family history includes GER disease in her father and mother and Heart disease in her father. No Known Allergies   Review of Systems see hpi     Objective:   Physical Exam  Nursing note and vitals reviewed. Constitutional: She appears well-developed and well-nourished. No distress.  HENT:  Head: Normocephalic and atraumatic.  Right Ear: External ear normal.  Left Ear: External ear normal.  Mouth/Throat: No oropharyngeal exudate.  Eyes: Conjunctivae are normal. No scleral icterus.  Neck: Neck supple.  Cardiovascular: Normal rate, regular rhythm and normal heart sounds.  Exam reveals no gallop and no friction rub.   No murmur heard. Pulmonary/Chest: Effort normal and breath sounds normal. No respiratory distress. She has no wheezes. She has no rales.  Neurological: She is alert.    Skin: Skin is warm and dry. She is not diaphoretic.  Psychiatric: She has a normal mood and affect.          Assessment & Plan:

## 2011-07-24 NOTE — Assessment & Plan Note (Signed)
Obtain lipid/lft. 

## 2011-07-29 ENCOUNTER — Other Ambulatory Visit: Payer: Self-pay | Admitting: Internal Medicine

## 2011-07-29 DIAGNOSIS — E785 Hyperlipidemia, unspecified: Secondary | ICD-10-CM

## 2011-07-29 MED ORDER — SIMVASTATIN 20 MG PO TABS: 20.0000 mg | ORAL_TABLET | Freq: Every day | ORAL | Status: DC

## 2011-08-05 ENCOUNTER — Telehealth: Payer: Self-pay | Admitting: *Deleted

## 2011-08-05 NOTE — Telephone Encounter (Signed)
Patient called and left a voice message requesting a return phone call regarding her blood sugars.

## 2011-08-08 NOTE — Telephone Encounter (Signed)
Patient returned phone call and left voice message regarding her blood sugar reading.    10-30: a180  p120  10-31:173 11-1: 178 11-2: 178 11-3:178 11-5: 199  11-7:314  11-8:208 11-9: 172  11-10: 169 11-11: a163 p145    11-12:a133  p137 11-13:  a150  p177 11-14: a154  p123 11-15:153

## 2011-08-08 NOTE — Telephone Encounter (Signed)
Add amaryl 2mg  po qd.

## 2011-08-09 MED ORDER — GLIMEPIRIDE 2 MG PO TABS: 2.0000 mg | ORAL_TABLET | ORAL | Status: DC

## 2011-08-09 NOTE — Telephone Encounter (Signed)
Call placed to patient at [phone], she was informed per Dr Rodena Medin instructions. Patient was advised to call with an update on she is tolerating the new medication in about 3 weeks. Patient verbalized understanding and agrees.  Rx sent to pharmacy.

## 2011-08-10 ENCOUNTER — Other Ambulatory Visit: Payer: Self-pay | Admitting: Internal Medicine

## 2011-08-10 NOTE — Telephone Encounter (Signed)
Rx refill sent to pharmacy. 

## 2011-10-10 ENCOUNTER — Other Ambulatory Visit (INDEPENDENT_AMBULATORY_CARE_PROVIDER_SITE_OTHER): Payer: PRIVATE HEALTH INSURANCE

## 2011-10-10 DIAGNOSIS — E785 Hyperlipidemia, unspecified: Secondary | ICD-10-CM

## 2011-10-10 LAB — HEPATIC FUNCTION PANEL
ALT: 18 U/L (ref 0–35)
AST: 16 U/L (ref 0–37)
Albumin: 3.7 g/dL (ref 3.5–5.2)
Alkaline Phosphatase: 63 U/L (ref 39–117)
Bilirubin, Direct: 0 mg/dL (ref 0.0–0.3)
Total Bilirubin: 0.4 mg/dL (ref 0.3–1.2)
Total Protein: 6.9 g/dL (ref 6.0–8.3)

## 2011-10-10 LAB — LIPID PANEL
Cholesterol: 141 mg/dL (ref 0–200)
HDL: 33.5 mg/dL — ABNORMAL LOW (ref >39.00)
LDL Cholesterol: 92 mg/dL (ref 0–99)
Total CHOL/HDL Ratio: 4
Triglycerides: 77 mg/dL (ref 0.0–149.0)
VLDL: 15.4 mg/dL (ref 0.0–40.0)

## 2011-10-14 ENCOUNTER — Other Ambulatory Visit: Payer: Self-pay | Admitting: Internal Medicine

## 2011-10-14 NOTE — Telephone Encounter (Signed)
Rx refill sent to pharmacy. 

## 2011-10-17 ENCOUNTER — Ambulatory Visit: Payer: Self-pay | Admitting: Internal Medicine

## 2011-10-19 ENCOUNTER — Ambulatory Visit (INDEPENDENT_AMBULATORY_CARE_PROVIDER_SITE_OTHER): Payer: PRIVATE HEALTH INSURANCE | Admitting: Internal Medicine

## 2011-10-19 ENCOUNTER — Encounter: Payer: Self-pay | Admitting: Internal Medicine

## 2011-10-19 ENCOUNTER — Telehealth: Payer: Self-pay | Admitting: Internal Medicine

## 2011-10-19 DIAGNOSIS — E785 Hyperlipidemia, unspecified: Secondary | ICD-10-CM

## 2011-10-19 DIAGNOSIS — E119 Type 2 diabetes mellitus without complications: Secondary | ICD-10-CM

## 2011-10-19 DIAGNOSIS — I10 Essential (primary) hypertension: Secondary | ICD-10-CM

## 2011-10-19 MED ORDER — HYDROCHLOROTHIAZIDE 25 MG PO TABS: 25.0000 mg | ORAL_TABLET | Freq: Every day | ORAL | Status: DC

## 2011-10-19 MED ORDER — OMEPRAZOLE 20 MG PO TBEC: 20.0000 mg | DELAYED_RELEASE_TABLET | Freq: Two times a day (BID) | ORAL | Status: DC

## 2011-10-19 MED ORDER — LINAGLIPTIN 5 MG PO TABS: 5.0000 mg | ORAL_TABLET | Freq: Every day | ORAL | Status: DC

## 2011-10-19 MED ORDER — GLIMEPIRIDE 4 MG PO TABS: 4.0000 mg | ORAL_TABLET | Freq: Every day | ORAL | Status: DC

## 2011-10-19 MED ORDER — NEBIVOLOL HCL 5 MG PO TABS: 5.0000 mg | ORAL_TABLET | Freq: Every day | ORAL | Status: DC

## 2011-10-19 MED ORDER — SIMVASTATIN 20 MG PO TABS: 20.0000 mg | ORAL_TABLET | Freq: Every day | ORAL | Status: DC

## 2011-10-19 NOTE — Patient Instructions (Signed)
Please schedule chem7, a1c, urine microalbumin 250.0 prior to next visit 

## 2011-10-19 NOTE — Telephone Encounter (Signed)
Lab orders entered for April 2013. 

## 2011-10-28 NOTE — Assessment & Plan Note (Signed)
Normotensive and stable. Continue current regimen. Monitor bp as outpt and followup in clinic as scheduled.  

## 2011-10-28 NOTE — Assessment & Plan Note (Signed)
suboptimal control. Increase amaryl 4mg  po qd.

## 2011-10-28 NOTE — Assessment & Plan Note (Signed)
ldl now at goal. hdl remains low. Continue zocor dose. Recommend regular exercise and wt loss.

## 2011-10-28 NOTE — Progress Notes (Signed)
  Subjective:    Patient ID: Eileen Richards, female    DOB: August 12, 1961, 51 y.o.   MRN: [id number]  HPI Pt presents to clinic for followup of multiple medical problems. Recent fsbs 115-145 with PP ~200 without hypoglycemia. a1c remains above goal. BP reviewed normal. Wt up 2 lbs. No active complaint.   Past Medical History  Diagnosis Date  . Depression   . Headache     neg MRI of Brain 01/2007  . Sleep apnea     mild  . Diabetes mellitus   . Hypertension   . Hyperlipemia   . Atypical chest pain     normal stress echo 02/2009 -LV size was normal LV global systolic function was normal- Normal wall motion; no LV regional wall motion abnormalities  . Asthma     childhood   Past Surgical History  Procedure Date  . Abdominal hysterectomy   . Dilation and curettage of uterus     Lysis of Adhesions    reports that she has quit smoking. She has never used smokeless tobacco. She reports that she does not drink alcohol or use illicit drugs. family history includes GER disease in her father and mother and Heart disease in her father. No Known Allergies    Review of Systems see hpi     Objective:   Physical Exam  Physical Exam  Nursing note and vitals reviewed. Constitutional: Appears well-developed and well-nourished. No distress.  HENT:  Head: Normocephalic and atraumatic.  Right Ear: External ear normal.  Left Ear: External ear normal.  Eyes: Conjunctivae are normal. No scleral icterus.  Neck: Neck supple. Carotid bruit is not present.  Cardiovascular: Normal rate, regular rhythm and normal heart sounds.  Exam reveals no gallop and no friction rub.   No murmur heard. Pulmonary/Chest: Effort normal and breath sounds normal. No respiratory distress. He has no wheezes. no rales.  Lymphadenopathy:    He has no cervical adenopathy.  Neurological:Alert.  Skin: Skin is warm and dry. Not diaphoretic.  Psychiatric: Has a normal mood and affect.        Assessment & Plan:

## 2011-11-21 ENCOUNTER — Other Ambulatory Visit: Payer: Self-pay | Admitting: Internal Medicine

## 2011-11-21 DIAGNOSIS — E785 Hyperlipidemia, unspecified: Secondary | ICD-10-CM

## 2011-11-21 NOTE — Telephone Encounter (Signed)
Rx refill sent to pharmacy. 

## 2011-12-28 ENCOUNTER — Ambulatory Visit: Payer: Self-pay | Admitting: Internal Medicine

## 2012-01-02 ENCOUNTER — Encounter: Payer: Self-pay | Admitting: Internal Medicine

## 2012-01-02 ENCOUNTER — Ambulatory Visit (HOSPITAL_BASED_OUTPATIENT_CLINIC_OR_DEPARTMENT_OTHER)
Admission: RE | Admit: 2012-01-02 | Discharge: 2012-01-02 | Disposition: A | Discharge status: Home / Self Care | Payer: 59 | Source: Ambulatory Visit | Attending: Internal Medicine | Admitting: Internal Medicine

## 2012-01-02 ENCOUNTER — Ambulatory Visit (INDEPENDENT_AMBULATORY_CARE_PROVIDER_SITE_OTHER): Payer: PRIVATE HEALTH INSURANCE | Admitting: Internal Medicine

## 2012-01-02 VITALS — BP 110/68 | HR 70 | Temp 97.8°F | Resp 16 | Ht 64.0 in | Wt 251.0 lb

## 2012-01-02 DIAGNOSIS — W19XXXA Unspecified fall, initial encounter: Secondary | ICD-10-CM

## 2012-01-02 DIAGNOSIS — I10 Essential (primary) hypertension: Secondary | ICD-10-CM

## 2012-01-02 DIAGNOSIS — M25569 Pain in unspecified knee: Secondary | ICD-10-CM

## 2012-01-02 DIAGNOSIS — M25561 Pain in right knee: Secondary | ICD-10-CM | POA: Insufficient documentation

## 2012-01-02 DIAGNOSIS — E119 Type 2 diabetes mellitus without complications: Secondary | ICD-10-CM

## 2012-01-02 DIAGNOSIS — M25562 Pain in left knee: Secondary | ICD-10-CM | POA: Insufficient documentation

## 2012-01-02 LAB — BASIC METABOLIC PANEL
BUN: 14 mg/dL (ref 6–23)
CO2: 30 mEq/L (ref 19–32)
Calcium: 9.4 mg/dL (ref 8.4–10.5)
Chloride: 102 mEq/L (ref 96–112)
Creat: 0.73 mg/dL (ref 0.50–1.10)
Glucose, Bld: 88 mg/dL (ref 70–99)
Potassium: 4 mEq/L (ref 3.5–5.3)
Sodium: 140 mEq/L (ref 135–145)

## 2012-01-02 MED ORDER — MELOXICAM 7.5 MG PO TABS: 7.5000 mg | ORAL_TABLET | Freq: Every day | ORAL | Status: DC | PRN

## 2012-01-02 NOTE — Patient Instructions (Signed)
Please schedule chem7, a1c (250.0) and lipid/lft (272.4) prior to next visit 

## 2012-01-02 NOTE — Assessment & Plan Note (Signed)
Normotensive and stable. Continue current regimen. Monitor bp as outpt and followup in clinic as scheduled.  

## 2012-01-02 NOTE — Assessment & Plan Note (Signed)
Improved control. Encouraged wt loss. Obtain chem7, a1c, and urine microalbumin.

## 2012-01-02 NOTE — Assessment & Plan Note (Signed)
Obtain plain xrays of bilateral knees. Stop asa and attempt mobic prn with food and no other nsaids. Attempt glucosamine. Consider orthopedic consult if instability persists.

## 2012-01-02 NOTE — Progress Notes (Signed)
  Subjective:    Patient ID: Eileen Richards, female    DOB: April 12, 1961, 51 y.o.   MRN: [id number]  HPI Pt presents to clinic for followup of multiple medical problems. Notes chronic bilateral left >right knee pain. Has some intermittent buckling. No injury/trauma. Taking asa products prn. Notes sinus drainage and irritation and is taking zyrtec prn. fsbs improved 110-150 without hypoglycemia. Wt down two pounds.  Past Medical History  Diagnosis Date  . Depression   . Headache     neg MRI of Brain 01/2007  . Sleep apnea     mild  . Diabetes mellitus   . Hypertension   . Hyperlipemia   . Atypical chest pain     normal stress echo 02/2009 -LV size was normal LV global systolic function was normal- Normal wall motion; no LV regional wall motion abnormalities  . Asthma     childhood   Past Surgical History  Procedure Date  . Abdominal hysterectomy   . Dilation and curettage of uterus     Lysis of Adhesions    reports that she has quit smoking. She has never used smokeless tobacco. She reports that she does not drink alcohol or use illicit drugs. family history includes GER disease in her father and mother and Heart disease in her father. No Known Allergies    Review of Systems see hpi     Objective:   Physical Exam  Physical Exam  Nursing note and vitals reviewed. Constitutional: Appears well-developed and well-nourished. No distress.  HENT:  Head: Normocephalic and atraumatic.  Right Ear: External ear normal.  Left Ear: External ear normal.  Eyes: Conjunctivae are normal. No scleral icterus.  Neck: Neck supple. Carotid bruit is not present.  Cardiovascular: Normal rate, regular rhythm and normal heart sounds.  Exam reveals no gallop and no friction rub.   No murmur heard. Pulmonary/Chest: Effort normal and breath sounds normal. No respiratory distress. He has no wheezes. no rales.  Lymphadenopathy:    He has no cervical adenopathy.  Neurological:Alert.  Skin: Skin is  warm and dry. Not diaphoretic.  Psychiatric: Has a normal mood and affect.        Assessment & Plan:

## 2012-01-03 LAB — MICROALBUMIN / CREATININE URINE RATIO
Creatinine, Urine: 40.3 mg/dL
Microalb Creat Ratio: 12.4 mg/g (ref 0.0–30.0)
Microalb, Ur: 0.5 mg/dL (ref 0.00–1.89)

## 2012-01-03 LAB — HEMOGLOBIN A1C
Hgb A1c MFr Bld: 7.2 % — ABNORMAL HIGH (ref <5.7)
Mean Plasma Glucose: 160 mg/dL — ABNORMAL HIGH (ref <117)

## 2012-03-01 ENCOUNTER — Telehealth: Payer: Self-pay | Admitting: Internal Medicine

## 2012-03-01 NOTE — Telephone Encounter (Signed)
Patient called and left voice message requesting a return phone call regarding a TB reaction and medication she has to take.

## 2012-03-01 NOTE — Telephone Encounter (Signed)
Call placed to patient at [phone], no answer. A detailed voice message was left for patient to return phone call with more information regarding her concerns.

## 2012-03-02 NOTE — Telephone Encounter (Signed)
Patient stated she a few weeks ago she came in contact with a patient that had TB. Employee Health is advising her medication due to recent and past exposure. She stated the exposure she had in 95 she was informed that she declined treatment. She stated Employee Health  Has given her paper work , and she would like for Dr Rodena Medin to take a look at there recommendations on medication.

## 2012-03-05 ENCOUNTER — Other Ambulatory Visit: Payer: Self-pay | Admitting: Internal Medicine

## 2012-03-05 DIAGNOSIS — Z79899 Other long term (current) drug therapy: Secondary | ICD-10-CM

## 2012-03-05 NOTE — Telephone Encounter (Signed)
Patient presented to office with papers for review. Concerns addressed by Dr Rodena Medin. No further action is required.

## 2012-03-05 NOTE — Telephone Encounter (Signed)
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 Recent possible exposure to pt with TB 12/2011. Entirely asx without cough, hemoptysis, weight loss or sweats. Occupational health recommended latent TB treatment. Discussed with patient pro's and con's of treatment including reduction of probability of active tb in future, chance of liver abnormalities and neuropathy. Wishes to proceed. Education handout sheet provided to pt with recommended dose of INH 300mg  qd x 9 months with daily b6 (states occupational health will provide medication.) she will monitor for sx's of neuropathy and will obtain lft's with next labs

## 2012-03-05 NOTE — Telephone Encounter (Signed)
Call placed to patient at [phone], no answer. A voice message was left informing patient that paperwork was not received for review.

## 2012-03-21 ENCOUNTER — Other Ambulatory Visit (INDEPENDENT_AMBULATORY_CARE_PROVIDER_SITE_OTHER): Payer: PRIVATE HEALTH INSURANCE

## 2012-03-21 DIAGNOSIS — Z79899 Other long term (current) drug therapy: Secondary | ICD-10-CM

## 2012-03-21 DIAGNOSIS — E119 Type 2 diabetes mellitus without complications: Secondary | ICD-10-CM

## 2012-03-21 LAB — HEPATIC FUNCTION PANEL
ALT: 21 U/L (ref 0–35)
AST: 20 U/L (ref 0–37)
Albumin: 3.5 g/dL (ref 3.5–5.2)
Alkaline Phosphatase: 67 U/L (ref 39–117)
Bilirubin, Direct: 0 mg/dL (ref 0.0–0.3)
Total Bilirubin: 0.5 mg/dL (ref 0.3–1.2)
Total Protein: 6.9 g/dL (ref 6.0–8.3)

## 2012-03-21 LAB — MICROALBUMIN / CREATININE URINE RATIO
Creatinine,U: 141.7 mg/dL
Microalb Creat Ratio: 0.4 mg/g (ref 0.0–30.0)
Microalb, Ur: 0.6 mg/dL (ref 0.0–1.9)

## 2012-03-21 LAB — BASIC METABOLIC PANEL
BUN: 17 mg/dL (ref 6–23)
CO2: 29 mEq/L (ref 19–32)
Calcium: 9 mg/dL (ref 8.4–10.5)
Chloride: 104 mEq/L (ref 96–112)
Creatinine, Ser: 0.7 mg/dL (ref 0.4–1.2)
GFR: 108.8 mL/min (ref >60.00)
Glucose, Bld: 184 mg/dL — ABNORMAL HIGH (ref 70–99)
Potassium: 3.9 mEq/L (ref 3.5–5.1)
Sodium: 139 mEq/L (ref 135–145)

## 2012-03-21 LAB — HEMOGLOBIN A1C: Hgb A1c MFr Bld: 7.8 % — ABNORMAL HIGH (ref 4.6–6.5)

## 2012-03-26 ENCOUNTER — Ambulatory Visit (INDEPENDENT_AMBULATORY_CARE_PROVIDER_SITE_OTHER): Payer: PRIVATE HEALTH INSURANCE | Admitting: Internal Medicine

## 2012-03-26 ENCOUNTER — Encounter: Payer: Self-pay | Admitting: Internal Medicine

## 2012-03-26 VITALS — BP 118/76 | HR 88 | Wt 251.0 lb

## 2012-03-26 DIAGNOSIS — R7611 Nonspecific reaction to tuberculin skin test without active tuberculosis: Secondary | ICD-10-CM

## 2012-03-26 DIAGNOSIS — K219 Gastro-esophageal reflux disease without esophagitis: Secondary | ICD-10-CM

## 2012-03-26 DIAGNOSIS — E119 Type 2 diabetes mellitus without complications: Secondary | ICD-10-CM

## 2012-03-26 MED ORDER — PANTOPRAZOLE SODIUM 40 MG PO TBEC: 40.0000 mg | DELAYED_RELEASE_TABLET | Freq: Every day | ORAL | Status: DC

## 2012-03-26 NOTE — Progress Notes (Signed)
  Subjective:    Patient ID: Eileen Richards, female    DOB: 03-Jun-1961, 51 y.o.   MRN: [id number]  HPI Pt presents to clinic for followup of multiple medical problems. Now taking INH qd with b6 for 9 months prescribed by employee health. Tolerating without adverse effect. Notes GERD sx's 3-4 days/week despite daily omeprazole. Denies dysphagia or abdominal pain. a1c increased from 7.2 to 7.8. Reviewed glucometer memory with split between 90-124 and upper 100's to low 200's. No hypoglycemia.   Past Medical History  Diagnosis Date  . Depression   . Headache     neg MRI of Brain 01/2007  . Sleep apnea     mild  . Diabetes mellitus   . Hypertension   . Hyperlipemia   . Atypical chest pain     normal stress echo 02/2009 -LV size was normal LV global systolic function was normal- Normal wall motion; no LV regional wall motion abnormalities  . Asthma     childhood   Past Surgical History  Procedure Date  . Abdominal hysterectomy   . Dilation and curettage of uterus     Lysis of Adhesions    reports that she has quit smoking. She has never used smokeless tobacco. She reports that she does not drink alcohol or use illicit drugs. family history includes GER disease in her father and mother and Heart disease in her father. No Known Allergies    Review of Systems see hpi     Objective:   Physical Exam  Physical Exam  Nursing note and vitals reviewed. Constitutional: Appears well-developed and well-nourished. No distress.  HENT:  Head: Normocephalic and atraumatic.  Right Ear: External ear normal.  Left Ear: External ear normal.  Eyes: Conjunctivae are normal. No scleral icterus.  Neck: Neck supple. Carotid bruit is not present.  Cardiovascular: Normal rate, regular rhythm and normal heart sounds.  Exam reveals no gallop and no friction rub.   No murmur heard. Pulmonary/Chest: Effort normal and breath sounds normal. No respiratory distress. He has no wheezes. no rales.    Lymphadenopathy:    He has no cervical adenopathy.  Neurological:Alert.  Skin: Skin is warm and dry. Not diaphoretic.  Psychiatric: Has a normal mood and affect.       Assessment & Plan:

## 2012-03-26 NOTE — Patient Instructions (Signed)
Please schedule fasting labs prior to next visit Chem7, a1c-250.00 and lipid/lft-272.4 

## 2012-03-26 NOTE — Assessment & Plan Note (Signed)
Asx. Continue prophylaxis. lft nl.

## 2012-03-26 NOTE — Assessment & Plan Note (Signed)
Change omeprazole to protonix qd. If sx's persist after 3-4 wks then GI consult for consideration of possible EGD.

## 2012-03-26 NOTE — Assessment & Plan Note (Signed)
suboptimal control complicated by variability suggestive of dietary indiscretion. Reinforced need for diabetic diet, regular exercise and weight loss

## 2012-06-12 ENCOUNTER — Other Ambulatory Visit: Payer: Self-pay | Admitting: Internal Medicine

## 2012-06-12 NOTE — Telephone Encounter (Signed)
Metformin request [Rx not on active medication list--shows D/C 11.29.12 in Rx History]/SLS Please advise.

## 2012-06-13 NOTE — Telephone Encounter (Signed)
Rx done/SLS 

## 2012-06-13 NOTE — Telephone Encounter (Signed)
Ok to fill rf6

## 2012-06-21 ENCOUNTER — Other Ambulatory Visit: Payer: Self-pay | Admitting: *Deleted

## 2012-06-21 ENCOUNTER — Other Ambulatory Visit (INDEPENDENT_AMBULATORY_CARE_PROVIDER_SITE_OTHER): Payer: PRIVATE HEALTH INSURANCE

## 2012-06-21 DIAGNOSIS — E119 Type 2 diabetes mellitus without complications: Secondary | ICD-10-CM

## 2012-06-21 DIAGNOSIS — E785 Hyperlipidemia, unspecified: Secondary | ICD-10-CM

## 2012-06-21 DIAGNOSIS — Z79899 Other long term (current) drug therapy: Secondary | ICD-10-CM

## 2012-06-21 LAB — CBC WITH DIFFERENTIAL/PLATELET
Basophils Absolute: 0 10*3/uL (ref 0.0–0.1)
Basophils Relative: 0.3 % (ref 0.0–3.0)
Eosinophils Absolute: 0.2 10*3/uL (ref 0.0–0.7)
Eosinophils Relative: 1.5 % (ref 0.0–5.0)
HCT: 41.3 % (ref 36.0–46.0)
Hemoglobin: 13.6 g/dL (ref 12.0–15.0)
Lymphocytes Relative: 17 % (ref 12.0–46.0)
Lymphs Abs: 2 10*3/uL (ref 0.7–4.0)
MCHC: 33 g/dL (ref 30.0–36.0)
MCV: 92.9 fl (ref 78.0–100.0)
Monocytes Absolute: 0.5 10*3/uL (ref 0.1–1.0)
Monocytes Relative: 4.1 % (ref 3.0–12.0)
Neutro Abs: 9.3 10*3/uL — ABNORMAL HIGH (ref 1.4–7.7)
Neutrophils Relative %: 77.1 % — ABNORMAL HIGH (ref 43.0–77.0)
Platelets: 385 10*3/uL (ref 150.0–400.0)
RBC: 4.44 Mil/uL (ref 3.87–5.11)
RDW: 13.8 % (ref 11.5–14.6)
WBC: 12 10*3/uL — ABNORMAL HIGH (ref 4.5–10.5)

## 2012-06-21 LAB — BASIC METABOLIC PANEL
BUN: 13 mg/dL (ref 6–23)
CO2: 30 mEq/L (ref 19–32)
Calcium: 9.3 mg/dL (ref 8.4–10.5)
Chloride: 102 mEq/L (ref 96–112)
Creatinine, Ser: 0.7 mg/dL (ref 0.4–1.2)
GFR: 108.69 mL/min (ref >60.00)
Glucose, Bld: 165 mg/dL — ABNORMAL HIGH (ref 70–99)
Potassium: 3.6 mEq/L (ref 3.5–5.1)
Sodium: 140 mEq/L (ref 135–145)

## 2012-06-21 LAB — LIPID PANEL
Cholesterol: 139 mg/dL (ref 0–200)
HDL: 34 mg/dL — ABNORMAL LOW (ref >39.00)
LDL Cholesterol: 84 mg/dL (ref 0–99)
Total CHOL/HDL Ratio: 4
Triglycerides: 107 mg/dL (ref 0.0–149.0)
VLDL: 21.4 mg/dL (ref 0.0–40.0)

## 2012-06-21 LAB — MICROALBUMIN / CREATININE URINE RATIO
Creatinine,U: 249.6 mg/dL
Microalb Creat Ratio: 0.4 mg/g (ref 0.0–30.0)
Microalb, Ur: 0.9 mg/dL (ref 0.0–1.9)

## 2012-06-21 LAB — HEMOGLOBIN A1C: Hgb A1c MFr Bld: 7.3 % — ABNORMAL HIGH (ref 4.6–6.5)

## 2012-06-21 NOTE — Progress Notes (Signed)
Lab orders placed for [facility] lab/SLS

## 2012-06-28 ENCOUNTER — Ambulatory Visit: Payer: Self-pay | Admitting: Internal Medicine

## 2012-07-10 ENCOUNTER — Ambulatory Visit: Payer: Self-pay | Admitting: Internal Medicine

## 2012-07-10 ENCOUNTER — Encounter (HOSPITAL_COMMUNITY): Payer: Self-pay | Admitting: Pharmacist

## 2012-07-12 ENCOUNTER — Encounter: Payer: Self-pay | Admitting: Internal Medicine

## 2012-07-12 ENCOUNTER — Telehealth: Payer: Self-pay | Admitting: Internal Medicine

## 2012-07-12 ENCOUNTER — Ambulatory Visit (INDEPENDENT_AMBULATORY_CARE_PROVIDER_SITE_OTHER): Payer: PRIVATE HEALTH INSURANCE | Admitting: Internal Medicine

## 2012-07-12 VITALS — BP 122/78 | HR 67 | Temp 98.0°F | Resp 12 | Ht 64.0 in | Wt 251.0 lb

## 2012-07-12 DIAGNOSIS — E785 Hyperlipidemia, unspecified: Secondary | ICD-10-CM

## 2012-07-12 DIAGNOSIS — I10 Essential (primary) hypertension: Secondary | ICD-10-CM

## 2012-07-12 DIAGNOSIS — E119 Type 2 diabetes mellitus without complications: Secondary | ICD-10-CM

## 2012-07-12 NOTE — Assessment & Plan Note (Signed)
Normotensive and stable. Continue current regimen. Monitor bp as outpt and followup in clinic as scheduled.  

## 2012-07-12 NOTE — Progress Notes (Signed)
  Subjective:    Patient ID: Eileen Richards, female    DOB: [birth year], 51 y.o.   MRN: [id number]  HPI Pt presents to clinic for followup of multiple medical problems. Reviewed improving a1c. No recent fsbs. BP reviewed normotensive. Tolerating inh for +ppd prophylaxis. States already received influenza vaccine for the season.  Past Medical History  Diagnosis Date  . Depression   . Headache     neg MRI of Brain 01/2007  . Sleep apnea     mild  . Diabetes mellitus   . Hypertension   . Hyperlipemia   . Atypical chest pain     normal stress echo 02/2009 -LV size was normal LV global systolic function was normal- Normal wall motion; no LV regional wall motion abnormalities  . Asthma     childhood   Past Surgical History  Procedure Date  . Abdominal hysterectomy   . Dilation and curettage of uterus     Lysis of Adhesions    reports that she has quit smoking. She has never used smokeless tobacco. She reports that she does not drink alcohol or use illicit drugs. family history includes GER disease in her father and mother and Heart disease in her father. No Known Allergies   Review of Systems see hpi     Objective:   Physical Exam  Physical Exam  Nursing note and vitals reviewed. Constitutional: Appears well-developed and well-nourished. No distress.  HENT:  Head: Normocephalic and atraumatic.  Right Ear: External ear normal.  Left Ear: External ear normal.  Eyes: Conjunctivae are normal. No scleral icterus.  Neck: Neck supple. Carotid bruit is not present.  Cardiovascular: Normal rate, regular rhythm and normal heart sounds.  Exam reveals no gallop and no friction rub.   No murmur heard. Pulmonary/Chest: Effort normal and breath sounds normal. No respiratory distress. He has no wheezes. no rales.  Lymphadenopathy:    He has no cervical adenopathy.  Neurological:Alert.  Skin: Skin is warm and dry. Not diaphoretic.  Psychiatric: Has a normal mood and affect.          Assessment & Plan:

## 2012-07-12 NOTE — Assessment & Plan Note (Signed)
Improving control. Resume daily fsbs. Encouraged regular aerobic exercise and weight loss

## 2012-07-12 NOTE — Telephone Encounter (Signed)
Lab order week of 10-02-2012 @ [facility] Chem7, a1c-250.00 and lft-v58.69

## 2012-07-12 NOTE — Patient Instructions (Signed)
Please schedule fasting labs prior to next visit Chem7, a1c-250.00 and lft-v58.69

## 2012-07-12 NOTE — Assessment & Plan Note (Signed)
ldl at goal. hdl low but improved. Begin regular exercise

## 2012-07-17 ENCOUNTER — Other Ambulatory Visit: Payer: Self-pay

## 2012-07-17 ENCOUNTER — Encounter (HOSPITAL_COMMUNITY)
Admission: RE | Admit: 2012-07-17 | Discharge: 2012-07-17 | Disposition: A | Discharge status: Home / Self Care | Payer: 59 | Source: Ambulatory Visit | Attending: Obstetrics and Gynecology | Admitting: Obstetrics and Gynecology

## 2012-07-17 ENCOUNTER — Encounter (HOSPITAL_COMMUNITY): Payer: Self-pay

## 2012-07-17 HISTORY — DX: Gastro-esophageal reflux disease without esophagitis: K21.9

## 2012-07-17 LAB — SURGICAL PCR SCREEN
MRSA, PCR: NEGATIVE
Staphylococcus aureus: NEGATIVE

## 2012-07-17 LAB — CBC
HCT: 39.7 % (ref 36.0–46.0)
Hemoglobin: 13.1 g/dL (ref 12.0–15.0)
MCH: 30 pg (ref 26.0–34.0)
MCHC: 33 g/dL (ref 30.0–36.0)
MCV: 90.8 fL (ref 78.0–100.0)
Platelets: 381 10*3/uL (ref 150–400)
RBC: 4.37 MIL/uL (ref 3.87–5.11)
RDW: 13.7 % (ref 11.5–15.5)
WBC: 11.8 10*3/uL — ABNORMAL HIGH (ref 4.0–10.5)

## 2012-07-17 LAB — BASIC METABOLIC PANEL
BUN: 14 mg/dL (ref 6–23)
CO2: 30 mEq/L (ref 19–32)
Calcium: 9.2 mg/dL (ref 8.4–10.5)
Chloride: 100 mEq/L (ref 96–112)
Creatinine, Ser: 0.72 mg/dL (ref 0.50–1.10)
GFR calc Af Amer: >90 mL/min (ref >90)
GFR calc non Af Amer: >90 mL/min (ref >90)
Glucose, Bld: 162 mg/dL — ABNORMAL HIGH (ref 70–99)
Potassium: 3.4 mEq/L — ABNORMAL LOW (ref 3.5–5.1)
Sodium: 139 mEq/L (ref 135–145)

## 2012-07-17 NOTE — Patient Instructions (Addendum)
Your procedure is scheduled on:07/23/12  Enter through the Main Entrance at :1130 am Pick up desk phone and dial [phone] and inform us of your arrival.  Please call [phone] if you have any problems the morning of surgery.  Remember: Do not eat after midnight:Sunday Clear liquids ok until 9am Monday  Take these meds the morning of surgery with a sip of water:HCTZ, Bystolic, bring inhaler to hospital HOLD METFORMIN FOR 24 HRS PRIOR TO SURGERY.....do not take any diabetes meds on day of surgery.  DO NOT wear jewelry, eye make-up, lipstick,body lotion, or dark fingernail polish. Do not shave for 48 hours prior to surgery.   Patients discharged on the day of surgery will not be allowed to drive home.

## 2012-07-19 ENCOUNTER — Other Ambulatory Visit: Payer: Self-pay | Admitting: Obstetrics and Gynecology

## 2012-07-23 ENCOUNTER — Encounter (HOSPITAL_COMMUNITY)
Admission: RE | Disposition: A | Discharge status: Home / Self Care | Payer: Self-pay | Source: Ambulatory Visit | Attending: Obstetrics and Gynecology

## 2012-07-23 ENCOUNTER — Ambulatory Visit (HOSPITAL_COMMUNITY): Admission: RE | Admit: 2012-07-23 | Payer: 59 | Source: Ambulatory Visit | Admitting: Obstetrics and Gynecology

## 2012-07-23 ENCOUNTER — Ambulatory Visit (HOSPITAL_COMMUNITY): Payer: 59 | Admitting: Anesthesiology

## 2012-07-23 ENCOUNTER — Encounter (HOSPITAL_COMMUNITY): Payer: Self-pay | Admitting: Anesthesiology

## 2012-07-23 ENCOUNTER — Ambulatory Visit (HOSPITAL_COMMUNITY)
Admission: RE | Admit: 2012-07-23 | Discharge: 2012-07-23 | Disposition: A | Discharge status: Home / Self Care | Payer: 59 | Source: Ambulatory Visit | Attending: Obstetrics and Gynecology | Admitting: Obstetrics and Gynecology

## 2012-07-23 DIAGNOSIS — N949 Unspecified condition associated with female genital organs and menstrual cycle: Secondary | ICD-10-CM | POA: Insufficient documentation

## 2012-07-23 DIAGNOSIS — R102 Pelvic and perineal pain: Secondary | ICD-10-CM

## 2012-07-23 DIAGNOSIS — Z01818 Encounter for other preprocedural examination: Secondary | ICD-10-CM | POA: Insufficient documentation

## 2012-07-23 DIAGNOSIS — N736 Female pelvic peritoneal adhesions (postinfective): Secondary | ICD-10-CM | POA: Insufficient documentation

## 2012-07-23 DIAGNOSIS — Z01812 Encounter for preprocedural laboratory examination: Secondary | ICD-10-CM | POA: Insufficient documentation

## 2012-07-23 DIAGNOSIS — N83209 Unspecified ovarian cyst, unspecified side: Secondary | ICD-10-CM | POA: Insufficient documentation

## 2012-07-23 HISTORY — PX: ROBOTIC ASSISTED LAPAROSCOPIC LYSIS OF ADHESION: SHX6080

## 2012-07-23 HISTORY — PX: CYSTOSCOPY: SHX5120

## 2012-07-23 HISTORY — PX: ROBOTIC ASSISTED SALPINGO OOPHERECTOMY: SHX6082

## 2012-07-23 LAB — GLUCOSE, CAPILLARY
Glucose-Capillary: 152 mg/dL — ABNORMAL HIGH (ref 70–99)
Glucose-Capillary: 170 mg/dL — ABNORMAL HIGH (ref 70–99)

## 2012-07-23 SURGERY — ROBOTIC ASSISTED SALPINGO OOPHORECTOMY
Anesthesia: General | Site: Bladder | Wound class: Clean Contaminated

## 2012-07-23 MED ORDER — MIDAZOLAM HCL 5 MG/5ML IJ SOLN
INTRAMUSCULAR | Status: DC | PRN
  Administered 2012-07-23: 2 mg via INTRAVENOUS

## 2012-07-23 MED ORDER — FENTANYL CITRATE 0.05 MG/ML IJ SOLN
INTRAMUSCULAR | Status: AC
  Filled 2012-07-23: qty 5

## 2012-07-23 MED ORDER — GLYCOPYRROLATE 0.2 MG/ML IJ SOLN
INTRAMUSCULAR | Status: DC | PRN
  Administered 2012-07-23: 0.1 mg via INTRAVENOUS

## 2012-07-23 MED ORDER — ESTRADIOL 0.1 MG/24HR TD PTWK: 1.0000 | MEDICATED_PATCH | TRANSDERMAL | Status: DC

## 2012-07-23 MED ORDER — NEOSTIGMINE METHYLSULFATE 1 MG/ML IJ SOLN
INTRAMUSCULAR | Status: DC | PRN
  Administered 2012-07-23: 3 mg via INTRAVENOUS

## 2012-07-23 MED ORDER — FENTANYL CITRATE 0.05 MG/ML IJ SOLN
INTRAMUSCULAR | Status: AC
  Administered 2012-07-23: 50 ug via INTRAVENOUS
  Filled 2012-07-23: qty 2

## 2012-07-23 MED ORDER — ESTRADIOL 0.1 MG/24HR TD PTWK
0.1000 mg | MEDICATED_PATCH | TRANSDERMAL | Status: DC
  Administered 2012-07-23: 0.1 mg via TRANSDERMAL
  Filled 2012-07-23: qty 1

## 2012-07-23 MED ORDER — ROCURONIUM BROMIDE 50 MG/5ML IV SOLN
INTRAVENOUS | Status: AC
  Filled 2012-07-23: qty 2

## 2012-07-23 MED ORDER — IBUPROFEN 800 MG PO TABS: 800.0000 mg | ORAL_TABLET | Freq: Three times a day (TID) | ORAL | Status: DC | PRN

## 2012-07-23 MED ORDER — HYDROMORPHONE HCL 2 MG PO TABS
ORAL_TABLET | ORAL | Status: AC
  Administered 2012-07-23: 4 mg via ORAL
  Filled 2012-07-23: qty 2

## 2012-07-23 MED ORDER — MIDAZOLAM HCL 2 MG/2ML IJ SOLN
INTRAMUSCULAR | Status: AC
  Filled 2012-07-23: qty 2

## 2012-07-23 MED ORDER — ROCURONIUM BROMIDE 100 MG/10ML IV SOLN
INTRAVENOUS | Status: DC | PRN
  Administered 2012-07-23 (×3): 10 mg via INTRAVENOUS
  Administered 2012-07-23: 40 mg via INTRAVENOUS
  Administered 2012-07-23: 5 mg via INTRAVENOUS
  Administered 2012-07-23: 10 mg via INTRAVENOUS

## 2012-07-23 MED ORDER — LACTATED RINGERS IV SOLN
INTRAVENOUS | Status: DC
  Administered 2012-07-23: 50 mL/h via INTRAVENOUS
  Administered 2012-07-23 (×2): via INTRAVENOUS

## 2012-07-23 MED ORDER — INDIGOTINDISULFONATE SODIUM 8 MG/ML IJ SOLN
INTRAMUSCULAR | Status: DC | PRN
  Administered 2012-07-23: 3 mL via INTRAVENOUS

## 2012-07-23 MED ORDER — LACTATED RINGERS IR SOLN
Status: DC | PRN
  Administered 2012-07-23: 3000 mL

## 2012-07-23 MED ORDER — FENTANYL CITRATE 0.05 MG/ML IJ SOLN
INTRAMUSCULAR | Status: DC | PRN
  Administered 2012-07-23: 50 ug via INTRAVENOUS
  Administered 2012-07-23: 100 ug via INTRAVENOUS
  Administered 2012-07-23 (×2): 50 ug via INTRAVENOUS

## 2012-07-23 MED ORDER — ONDANSETRON HCL 4 MG/2ML IJ SOLN
INTRAMUSCULAR | Status: DC | PRN
  Administered 2012-07-23: 4 mg via INTRAVENOUS

## 2012-07-23 MED ORDER — BUPIVACAINE HCL (PF) 0.25 % IJ SOLN
INTRAMUSCULAR | Status: DC | PRN
  Administered 2012-07-23: 10 mL

## 2012-07-23 MED ORDER — HYDROMORPHONE HCL 2 MG PO TABS
4.0000 mg | ORAL_TABLET | Freq: Once | ORAL | Status: AC
  Administered 2012-07-23: 4 mg via ORAL

## 2012-07-23 MED ORDER — FENTANYL CITRATE 0.05 MG/ML IJ SOLN
25.0000 ug | INTRAMUSCULAR | Status: DC | PRN
  Administered 2012-07-23: 50 ug via INTRAVENOUS

## 2012-07-23 MED ORDER — MIDAZOLAM HCL 2 MG/2ML IJ SOLN: 0.5000 mg | Freq: Once | INTRAMUSCULAR | Status: DC | PRN

## 2012-07-23 MED ORDER — HYDROMORPHONE HCL 4 MG PO TABS: 4.0000 mg | ORAL_TABLET | ORAL | Status: DC | PRN

## 2012-07-23 MED ORDER — KETOROLAC TROMETHAMINE 30 MG/ML IJ SOLN: 15.0000 mg | Freq: Once | INTRAMUSCULAR | Status: DC | PRN

## 2012-07-23 MED ORDER — ACETAMINOPHEN 10 MG/ML IV SOLN
INTRAVENOUS | Status: DC | PRN
  Administered 2012-07-23: 1000 mg via INTRAVENOUS

## 2012-07-23 MED ORDER — PHENYLEPHRINE HCL 10 MG/ML IJ SOLN
INTRAMUSCULAR | Status: DC | PRN
  Administered 2012-07-23: 40 ug via INTRAVENOUS
  Administered 2012-07-23 (×2): 80 ug via INTRAVENOUS
  Administered 2012-07-23: 40 ug via INTRAVENOUS
  Administered 2012-07-23: 80 ug via INTRAVENOUS

## 2012-07-23 MED ORDER — LIDOCAINE HCL (CARDIAC) 20 MG/ML IV SOLN
INTRAVENOUS | Status: AC
  Filled 2012-07-23: qty 5

## 2012-07-23 MED ORDER — ESTRADIOL 0.1 MG/24HR TD PTTW: 1.0000 | MEDICATED_PATCH | TRANSDERMAL | Status: DC

## 2012-07-23 MED ORDER — MEPERIDINE HCL 25 MG/ML IJ SOLN: 6.2500 mg | INTRAMUSCULAR | Status: DC | PRN

## 2012-07-23 MED ORDER — PROPOFOL 10 MG/ML IV EMUL
INTRAVENOUS | Status: DC | PRN
  Administered 2012-07-23: 200 mg via INTRAVENOUS

## 2012-07-23 MED ORDER — LIDOCAINE HCL (CARDIAC) 20 MG/ML IV SOLN
INTRAVENOUS | Status: DC | PRN
  Administered 2012-07-23: 50 mg via INTRAVENOUS

## 2012-07-23 MED ORDER — ONDANSETRON HCL 4 MG/2ML IJ SOLN
INTRAMUSCULAR | Status: AC
  Filled 2012-07-23: qty 2

## 2012-07-23 MED ORDER — KETOROLAC TROMETHAMINE 30 MG/ML IJ SOLN
INTRAMUSCULAR | Status: DC | PRN
  Administered 2012-07-23: 30 mg via INTRAVENOUS

## 2012-07-23 MED ORDER — GLYCOPYRROLATE 0.2 MG/ML IJ SOLN
INTRAMUSCULAR | Status: DC | PRN
  Administered 2012-07-23: 0.4 mg via INTRAVENOUS

## 2012-07-23 MED ORDER — ACETAMINOPHEN 10 MG/ML IV SOLN
1000.0000 mg | Freq: Once | INTRAVENOUS | Status: DC
  Filled 2012-07-23: qty 100

## 2012-07-23 MED ORDER — PROMETHAZINE HCL 25 MG/ML IJ SOLN: 6.2500 mg | INTRAMUSCULAR | Status: DC | PRN

## 2012-07-23 MED ORDER — PROPOFOL 10 MG/ML IV EMUL
INTRAVENOUS | Status: AC
  Filled 2012-07-23: qty 20

## 2012-07-23 SURGICAL SUPPLY — 67 items
BAG URINE DRAINAGE (UROLOGICAL SUPPLIES) ×5 IMPLANT
BARRIER ADHS 3X4 INTERCEED (GAUZE/BANDAGES/DRESSINGS) ×5 IMPLANT
CABLE HIGH FREQUENCY MONO STRZ (ELECTRODE) ×5 IMPLANT
CATH FOLEY 3WAY  5CC 16FR (CATHETERS) ×1
CATH FOLEY 3WAY 5CC 16FR (CATHETERS) ×4 IMPLANT
CHLORAPREP W/TINT 26ML (MISCELLANEOUS) ×10 IMPLANT
CLOTH BEACON ORANGE TIMEOUT ST (SAFETY) ×5 IMPLANT
CONT PATH 16OZ SNAP LID 3702 (MISCELLANEOUS) ×5 IMPLANT
COVER MAYO STAND STRL (DRAPES) ×5 IMPLANT
COVER TABLE BACK 60X90 (DRAPES) ×10 IMPLANT
COVER TIP SHEARS 8 DVNC (MISCELLANEOUS) ×4 IMPLANT
COVER TIP SHEARS 8MM DA VINCI (MISCELLANEOUS) ×1
DECANTER SPIKE VIAL GLASS SM (MISCELLANEOUS) ×10 IMPLANT
DERMABOND ADVANCED (GAUZE/BANDAGES/DRESSINGS) ×1
DERMABOND ADVANCED .7 DNX12 (GAUZE/BANDAGES/DRESSINGS) ×4 IMPLANT
DEVICE TROCAR PUNCTURE CLOSURE (ENDOMECHANICALS) IMPLANT
DRAPE HUG U DISPOSABLE (DRAPE) ×5 IMPLANT
DRAPE LG THREE QUARTER DISP (DRAPES) ×10 IMPLANT
DRAPE WARM FLUID 44X44 (DRAPE) ×5 IMPLANT
ELECT REM PT RETURN 9FT ADLT (ELECTROSURGICAL) ×5
ELECTRODE REM PT RTRN 9FT ADLT (ELECTROSURGICAL) ×4 IMPLANT
EVACUATOR SMOKE 8.L (FILTER) ×15 IMPLANT
GAUZE VASELINE 3X9 (GAUZE/BANDAGES/DRESSINGS) IMPLANT
GLOVE BIO SURGEON STRL SZ 6.5 (GLOVE) ×10 IMPLANT
GLOVE BIOGEL PI IND STRL 7.0 (GLOVE) ×32 IMPLANT
GLOVE BIOGEL PI IND STRL 7.5 (GLOVE) ×12 IMPLANT
GLOVE BIOGEL PI INDICATOR 7.0 (GLOVE) ×8
GLOVE BIOGEL PI INDICATOR 7.5 (GLOVE) ×3
GLOVE SURG SS PI 7.0 STRL IVOR (GLOVE) ×15 IMPLANT
GOWN STRL REIN XL XLG (GOWN DISPOSABLE) ×35 IMPLANT
KIT ACCESSORY DA VINCI DISP (KITS) ×1
KIT ACCESSORY DVNC DISP (KITS) ×4 IMPLANT
KIT DISP ACCESSORY 4 ARM (KITS) IMPLANT
LEGGING LITHOTOMY PAIR STRL (DRAPES) ×10 IMPLANT
NEEDLE INSUFFLATION 14GA 120MM (NEEDLE) ×5 IMPLANT
NS IRRIG 1000ML POUR BTL (IV SOLUTION) ×15 IMPLANT
OCCLUDER COLPOPNEUMO (BALLOONS) IMPLANT
PACK LAVH (CUSTOM PROCEDURE TRAY) ×5 IMPLANT
PAD OB MATERNITY 4.3X12.25 (PERSONAL CARE ITEMS) ×5 IMPLANT
PAD PREP 24X48 CUFFED NSTRL (MISCELLANEOUS) ×10 IMPLANT
PLUG CATH AND CAP STER (CATHETERS) ×5 IMPLANT
PROTECTOR NERVE ULNAR (MISCELLANEOUS) ×10 IMPLANT
SCISSORS LAP 5X35 DISP (ENDOMECHANICALS) ×5 IMPLANT
SET CYSTO W/LG BORE CLAMP LF (SET/KITS/TRAYS/PACK) ×5 IMPLANT
SET IRRIG TUBING LAPAROSCOPIC (IRRIGATION / IRRIGATOR) ×5 IMPLANT
SOLUTION ELECTROLUBE (MISCELLANEOUS) ×5 IMPLANT
SPONGE LAP 18X18 X RAY DECT (DISPOSABLE) IMPLANT
SUT VIC AB 0 CT1 27 (SUTURE) ×5
SUT VIC AB 0 CT1 27XBRD ANTBC (SUTURE) ×20 IMPLANT
SUT VICRYL 0 UR6 27IN ABS (SUTURE) ×5 IMPLANT
SUT VICRYL 4-0 PS2 18IN ABS (SUTURE) ×10 IMPLANT
SYR 50ML LL SCALE MARK (SYRINGE) ×5 IMPLANT
SYS RETRIEVAL 5MM INZII UNIV (BASKET) ×10
SYSTEM CONVERTIBLE TROCAR (TROCAR) IMPLANT
SYSTEM RETRIEVL 5MM INZII UNIV (BASKET) ×8 IMPLANT
TIP UTERINE 5.1X6CM LAV DISP (MISCELLANEOUS) IMPLANT
TIP UTERINE 6.7X10CM GRN DISP (MISCELLANEOUS) IMPLANT
TIP UTERINE 6.7X6CM WHT DISP (MISCELLANEOUS) IMPLANT
TIP UTERINE 6.7X8CM BLUE DISP (MISCELLANEOUS) IMPLANT
TOWEL OR 17X24 6PK STRL BLUE (TOWEL DISPOSABLE) ×15 IMPLANT
TROCAR 12M 150ML BLUNT (TROCAR) ×5 IMPLANT
TROCAR DISP BLADELESS 8 DVNC (TROCAR) ×4 IMPLANT
TROCAR DISP BLADELESS 8MM (TROCAR) ×1
TROCAR Z-THREAD 12X150 (TROCAR) ×5 IMPLANT
TROCAR Z-THREAD BLADED 12X100M (TROCAR) ×5 IMPLANT
TUBING FILTER THERMOFLATOR (ELECTROSURGICAL) ×5 IMPLANT
WARMER LAPAROSCOPE (MISCELLANEOUS) ×5 IMPLANT

## 2012-07-23 NOTE — Anesthesia Preprocedure Evaluation (Addendum)
Anesthesia Evaluation  Patient identified by MRN, date of birth, ID band Patient awake    Reviewed: Allergy & Precautions, H&P , Patient's Chart, lab work & pertinent test results, reviewed documented beta blocker date and time   History of Anesthesia Complications Negative for: history of anesthetic complications  Airway Mallampati: III TM Distance: >3 FB Neck ROM: limited    Dental No notable dental hx.    Pulmonary neg pulmonary ROS, shortness of breath, asthma ,  breath sounds clear to auscultation  Pulmonary exam normal       Cardiovascular Exercise Tolerance: Good hypertension, negative cardio ROS  Rhythm:regular Rate:Normal     Neuro/Psych negative neurological ROS  negative psych ROS   GI/Hepatic negative GI ROS, Neg liver ROS, GERD-  Controlled,  Endo/Other  negative endocrine ROSdiabetesMorbid obesity  Renal/GU negative Renal ROS     Musculoskeletal   Abdominal   Peds  Hematology negative hematology ROS (+)   Anesthesia Other Findings  Hyperlipemia     Atypical chest pain   normal stress echo 02/2009 -LV size was normal LV global systolic function was normal- Normal wall motion; no LV regional wall motion abnormalities    Asthma   childhood GERD (gastroesophageal reflux disease)    Reproductive/Obstetrics negative OB ROS                          Anesthesia Physical Anesthesia Plan  ASA: III  Anesthesia Plan: General ETT   Post-op Pain Management:    Induction:   Airway Management Planned:   Additional Equipment:   Intra-op Plan:   Post-operative Plan:   Informed Consent: I have reviewed the patients History and Physical, chart, labs and discussed the procedure including the risks, benefits and alternatives for the proposed anesthesia with the patient or authorized representative who has indicated his/her understanding and acceptance.   Dental Advisory Given  Plan  Discussed with: CRNA and Surgeon  Anesthesia Plan Comments:         Anesthesia Quick Evaluation

## 2012-07-23 NOTE — Transfer of Care (Signed)
Immediate Anesthesia Transfer of Care Note  Patient: Eileen Richards  Procedure(s) Performed: Procedure(s) (LRB) with comments: ROBOTIC ASSISTED SALPINGO OOPHERECTOMY (Bilateral) ROBOTIC ASSISTED LAPAROSCOPIC LYSIS OF ADHESION (N/A) CYSTOSCOPY (N/A)  Patient Location: PACU  Anesthesia Type:General  Level of Consciousness: awake, alert  and oriented  Airway & Oxygen Therapy: Patient Spontanous Breathing and Patient connected to nasal cannula oxygen  Post-op Assessment: Report given to PACU RN and Post -op Vital signs reviewed and stable  Post vital signs: stable  Complications: No apparent anesthesia complications

## 2012-07-23 NOTE — Brief Op Note (Signed)
07/23/2012  5:51 PM  PATIENT:  Eileen Richards  52 y.o. female  PRE-OPERATIVE DIAGNOSIS:  Right complex ovarian cyst, Lower pelvic pain  POST-OPERATIVE DIAGNOSIS:  Extensive abdominal pelvic adhesions, Right complex ovarian cyst, Lower pelvic pain  PROCEDURE:  Da Vinci robotic laparoscopic bilateral salpingo-opherectomy,  Extensive abdominopelvic adhesiolysis, cystoscopy  SURGEON:  Surgeon(s) and Role:    * Serita Kyle, MD - Primary  PHYSICIAN ASSISTANT:   [email or web address] [email or web address]   ANESTHESIA:   general  FINDINGS; omental adhesions to anterior abdominal wall, bowel adhesions to left pelvic sidewall, both adnexas with adhesions to bowel and pelvic sidewall. Adhesions posterior to vaginal cuff, right adnexa to vaginal cuff, right ureter seen, left ureter not seen, fallopian tubes adherent and twisted around ovaries bilaterally, nl appendix, nl liver edge, left tube and ovaries adherent to bowel and tortuous  EBL:  Total I/O In: 1900 [I.V.:1900] Out: 900 [Urine:800; Blood:100]  BLOOD ADMINISTERED:none  DRAINS: none   LOCAL MEDICATIONS USED:  MARCAINE     SPECIMEN:  Source of Specimen:  bilateral tubes and ovaries  DISPOSITION OF SPECIMEN:  PATHOLOGY  COUNTS:  YES  TOURNIQUET:  * No tourniquets in log *  DICTATION: .Other Dictation: Dictation Number [id number]  PLAN OF CARE: Discharge to home after PACU 2 PATIENT DISPOSITION:  PACU - hemodynamically stable.   Delay start of Pharmacological VTE agent (>24hrs) due to surgical blood loss or risk of bleeding: no

## 2012-07-23 NOTE — Anesthesia Postprocedure Evaluation (Signed)
Anesthesia Post Note  Patient: Eileen Richards  Procedure(s) Performed: Procedure(s) (LRB): ROBOTIC ASSISTED SALPINGO OOPHERECTOMY (Bilateral) ROBOTIC ASSISTED LAPAROSCOPIC LYSIS OF ADHESION (N/A) CYSTOSCOPY (N/A)  Anesthesia type: General  Patient location: PACU  Post pain: Pain level controlled  Post assessment: Post-op Vital signs reviewed  Last Vitals:  Filed Vitals:   07/23/12 1830  BP: 109/59  Pulse: 69  Temp: 36.5 C  Resp: 20    Post vital signs: Reviewed  Level of consciousness: sedated  Complications: No apparent anesthesia complications

## 2012-07-24 ENCOUNTER — Encounter (HOSPITAL_COMMUNITY): Payer: Self-pay | Admitting: Obstetrics and Gynecology

## 2012-07-24 LAB — GLUCOSE, CAPILLARY: Glucose-Capillary: 152 mg/dL — ABNORMAL HIGH (ref 70–99)

## 2012-07-24 NOTE — Op Note (Signed)
NAMEBRIASIA, Eileen Richards            ACCOUNT NO.:  [id number]  MEDICAL RECORD NO.:  [id number]  LOCATION:  [facility]                          FACILITY:  [facility]  PHYSICIAN:  Maxie Better, M.D.DATE OF BIRTH:  1961/07/26  DATE OF PROCEDURE:  07/23/2012 DATE OF DISCHARGE:  07/23/2012                              OPERATIVE REPORT   PREOPERATIVE DIAGNOSES:  Pelvic pain/right lower quadrant pain, persistent right complex ovarian cyst.  POSTOPERATIVE DIAGNOSES:  Extensive abdominopelvic adhesions, pelvic pain, right complex ovarian cyst.  PROCEDURES:  Da Vinci robotic  bilateral salpingo- oophorectomy, extensive abdominopelvic adhesiolysis, cystoscopy  ANESTHESIA:  General.  SURGEON:  Maxie Better, MD  ASSISTANT:  Marlinda Mike, CNM  HISTORY OF PRESENT ILLNESS:  This is a 51 year old, gravida 2, para 2, black female with history of hysterectomy who presented with pelvic pain with resultant finding of right complex ovarian cyst, which persisted. The patient had normal CA-125.  The patient now presents for surgical management.  PROCEDURE:  Under adequate general anesthesia, the patient was placed in the dorsal lithotomy position in preparation for robotic surgery. Examination under anesthesia was limited by the patient's body habitus, however, no adnexal masses could be appreciated.  The patient was sterilely prepped and draped in usual fashion and 3-way Foley catheter was sterilely placed.  A ring clamp with the sponge was introduced into the vagina to localize the vaginal cuff.  Attention was then turned to the abdomen.  A 0.25% Marcaine was injected in the umbilicus.  A small incision was made in the umbilicus and the Veress needle was introduced, tested.  Carbon dioxide was insufflated, however, that induced inadequate flow.  The Veress needle was removed and reinserted and tested with subsequently 3.5 L of CO2 being insufflated.  The Veress needle was then removed.  A  infraumbilical incision was made.  A 12 mm disposable trocar with sleeve was introduced in the abdomen without incident.  A lighted video laparoscope with the robot camera was introduced.  At that point, it was then noted omental adhesion had been encountered on the anterior abdominal wall through which the port had been inserted.  With the camera in place, it was then noted that there was omental adhesion in the anterior abdominal wall.  Through that omental adhesion, the left lower quadrant was finally visualized with also adhesion of the bowels with adhesions to the left anterior abdominal wall.  The appendix was noted.   The liver edge was noted to be normal.  With continued insufflation, two 8 mm robotic port sites was placed on the patient's left side.  Hands breadth 10 cm away from the umbilical port, those 2 robotic 8 mm port sites were placed under direct visualization.  Hot scissors were then utilized to lyse the adhesions in the left lower quadrant as well as the omental adhesion to the anterior abdominal wall.  Once this was done, the visualization for the pelvis was noted.  There was still omental adhesion approximately to the infraumbilical port site.  Nonetheless, the patient was placed in the deep Trendelenburg position.  The pelvis was then inspected.  There was clearly evidence of the right tube had been encased with adhesions along with that ovary with  some adhesions to the pelvic sidewalls into the bowels as well as to the vaginal cuff.  On the left side, similarly adhesions of the adnexa to the left pelvic sidewall was encountered as well as bowels.  On the right, an 8 mm robotic port site was placed and a 5-mm assistant port was placed in the right lower quadrant.  The robot was then docked on the patient's left side and monopolar scissors was placed in arm #1.  Arm #2 was a PK dissector and a ProGrasp was placed at arm #3.  I then went to the surgical console.  At  the surgical console, the adhesions on the left lateral wall was lysed further.  The adhesions in the vaginal cuff area using the manipulator in the vagina was also lysed.  The pelvic sidewall of the right adhesions were then also lysed.  Adhesions that was holding the appendix down in that area was also lysed and freed the appendix to be push upwardly.  The fallopian tube adhesions were then lysed.  The bowels was lysed off of that adnexa on the right and the ureter was then looked for on the peritoneal side.  It was not noted.  Therefore, the retroperitoneal space on the right was opened and careful dissection was then performed as well as continued removal of the adhesions of the ovary from the tube and the sidewall was continued.  The ureter was finally identified on the right peristalsing deep in the pelvis.  At that point, the continued removal of the adhesions from the vaginal cuff as well as the proximal part of the adnexa on the right with the tube and ovary was done and the infundibulopelvic ligament was identified, serially clamped, cauterized, and then cut.  With sharp and blunt dissection, the right tube and ovary was ultimately removed.  Once this was done, further adhesion and removal was done for the vaginal cuff and the specimen placed in the  cul-de-sac.  Attention was then turned to the left side with the intention had  Been initially  for removal of the left tube, however, given these circumstances( extent of adhesions of both tube and ovaries), decision was then made to pursue removal of the left tube and ovary as well.  Again the adhesions from the bowels were then lysed on the left.  Retroperitoneal space was opened, however, it was little bit more difficult to find the ureter.  The bowels was taken off of the adnexa on the left and finally freeing up the medial side of the peritoneum, however, despite several attempts, the ureter was not identified.  Reentry into the  retroperitoneal space on the left did not reveal the ureter.  Nonetheless, the fimbriated end was finally identified, freed up the fallopian tube which was bent on itself was also freed up.  The left IP was identified, felt to be isolated, clamped, cauterized, and then cut and with continued blunt and sharp dissection, the left tube and ovary was also then removed.  No evidence of ovarian tissue noted on either side after which again attempt with finding the ureter was unsuccessful.  At that point, decision was then made to perform a cystoscopy to check the left ureter.  Small bleeding were cauterized carefully.  The abdomen was copiously irrigated and suctioned of debris.  Once good hemostasis was noted, robotic instruments were removed.  The robot was undocked and using the 8 mm robotic camera and the infraumbilical port site, the endo loop bag x2  was placed.  Both specimens were then removed through the umbilical site.  The pelvis was once again inspected, irrigated, suctioned of debris.  Small bleeders cauterized.  Good hemostasis was then noted.  It was during the removal of the specimen that there was clear evidence of omental adhesion surrounding the infraumbilical site.  Decision was then made to lyse the adhesion as well.  Using the 8 mm robotic port camera and the hot scissors, the omentum was taken off entirely at the anterior abdominal wall in that area.  Small bleeder from the removed adhesion was cauterized with good hemostasis subsequently noted.  The robotic port sites were removed as well as the infraumbilical site. Infraumbilical fascia was identified and 0 Vicryl figure-of-eight sutures placed.  The skin incisions was approximated using 4-0 Vicryl subcuticular sutures.  Attention was then turned to the vagina.  The instruments in the vagina was removed.  The Foley catheter was removed. Indigo carmine was given intravenously and the cystoscope was inserted. Blue dye had  already been coming through.  Nonetheless, both ureteral jets were noted and with indigo carmine spilling through quite easily. At that point, the cystoscope was removed and then all other instruments removed as well.  SPECIMEN:  Right and left fallopian tube and ovaries.  ESTIMATED BLOOD LOSS:  150-200 mL.  INTRAOPERATIVE FLUID:  1800 mL.  URINE OUTPUT:  500 mL.  Initially clear urine followed by the blue dyed urine.  Sponge and instrument counts x2 was correct.  COMPLICATION:  None.  The patient tolerated the procedure very well.  She was transferred to recovery room in stable condition.     Maxie Better, M.D.     [place]/[facility]  D:  07/23/2012  T:  07/24/2012  Job:  [id number]

## 2012-09-27 ENCOUNTER — Telehealth: Payer: Self-pay | Admitting: *Deleted

## 2012-09-27 NOTE — Telephone Encounter (Signed)
Received call from pt stating her FBS was 292 this morning. She ate a banana for breakfast and BS is now 305. Reports that she had grilled chicken and 2 pieces of wheat bread for supper last night. Denies missed doses of medication and currently takes metformin 500mg  twice a day and glimepiride 4mg  once a day. Pt notes "only slight blurred vision at the moment." Pt states she feels okay but would like to know what she should do to bring her BS down. Pt requests that we page her at work at [phone].

## 2012-09-27 NOTE — Telephone Encounter (Signed)
Is taking amaryl 4mg  qd. Can take another 1/2 tablet today.  Would help if she would give her recent fsbs ranges

## 2012-09-27 NOTE — Telephone Encounter (Signed)
Paged pt to return my call.

## 2012-09-27 NOTE — Telephone Encounter (Signed)
Notified pt, she states FBS has been around the 150s and less. Per verbal from Provider, instructed pt to recheck BS and if still high to take another 1/2 tablet of amaryl today only. Advised pt to continue to monitor BS over the next few days and call if readings are still elevated. Pt voices understanding.

## 2012-09-28 ENCOUNTER — Telehealth: Payer: Self-pay | Admitting: Internal Medicine

## 2012-09-28 NOTE — Telephone Encounter (Signed)
Pt concerned about readings below. Wanted to know what to do before the weekend re: continued elevated readings.

## 2012-09-28 NOTE — Telephone Encounter (Signed)
Change amaryl to 4mg  po bid. Monitor fsbs closely. Will need appt if glucose readings don't improve significantly in the next few days

## 2012-09-28 NOTE — Telephone Encounter (Signed)
Patient called in with BS readings  8:30 235 fasting 11:55 198 fasting 12:15 216 fasting 1:10 182 fasting 2:36 252 oatmeal prior 3:51 277 fasting  Patient would like callback by 5pm

## 2012-10-09 ENCOUNTER — Encounter: Payer: Self-pay | Admitting: Family Medicine

## 2012-10-09 ENCOUNTER — Other Ambulatory Visit: Payer: Self-pay | Admitting: Family Medicine

## 2012-10-09 ENCOUNTER — Ambulatory Visit (INDEPENDENT_AMBULATORY_CARE_PROVIDER_SITE_OTHER): Payer: PRIVATE HEALTH INSURANCE | Admitting: Family Medicine

## 2012-10-09 VITALS — BP 134/80 | HR 83 | Temp 98.3°F | Ht 64.0 in | Wt 254.1 lb

## 2012-10-09 DIAGNOSIS — M79609 Pain in unspecified limb: Secondary | ICD-10-CM

## 2012-10-09 DIAGNOSIS — E785 Hyperlipidemia, unspecified: Secondary | ICD-10-CM

## 2012-10-09 DIAGNOSIS — I10 Essential (primary) hypertension: Secondary | ICD-10-CM

## 2012-10-09 DIAGNOSIS — M79671 Pain in right foot: Secondary | ICD-10-CM

## 2012-10-09 DIAGNOSIS — H6693 Otitis media, unspecified, bilateral: Secondary | ICD-10-CM

## 2012-10-09 DIAGNOSIS — H6690 Otitis media, unspecified, unspecified ear: Secondary | ICD-10-CM

## 2012-10-09 DIAGNOSIS — R42 Dizziness and giddiness: Secondary | ICD-10-CM

## 2012-10-09 DIAGNOSIS — E119 Type 2 diabetes mellitus without complications: Secondary | ICD-10-CM

## 2012-10-09 LAB — CBC
HCT: 39.7 % (ref 36.0–46.0)
Hemoglobin: 13.8 g/dL (ref 12.0–15.0)
MCH: 30.2 pg (ref 26.0–34.0)
MCHC: 34.8 g/dL (ref 30.0–36.0)
MCV: 86.9 fL (ref 78.0–100.0)
Platelets: 431 10*3/uL — ABNORMAL HIGH (ref 150–400)
RBC: 4.57 MIL/uL (ref 3.87–5.11)
RDW: 13.9 % (ref 11.5–15.5)
WBC: 10.5 10*3/uL (ref 4.0–10.5)

## 2012-10-09 MED ORDER — AMOXICILLIN-POT CLAVULANATE 875-125 MG PO TABS: 1.0000 | ORAL_TABLET | Freq: Two times a day (BID) | ORAL | Status: DC

## 2012-10-09 MED ORDER — TRAMADOL HCL 50 MG PO TABS: 50.0000 mg | ORAL_TABLET | Freq: Two times a day (BID) | ORAL | Status: DC | PRN

## 2012-10-09 MED ORDER — MELOXICAM 15 MG PO TABS: 15.0000 mg | ORAL_TABLET | Freq: Every day | ORAL | Status: DC

## 2012-10-09 MED ORDER — GUAIFENESIN ER 600 MG PO TB12: 600.0000 mg | ORAL_TABLET | Freq: Two times a day (BID) | ORAL | Status: DC

## 2012-10-09 MED ORDER — MECLIZINE HCL 25 MG PO TABS: 25.0000 mg | ORAL_TABLET | Freq: Three times a day (TID) | ORAL | Status: DC | PRN

## 2012-10-09 NOTE — Patient Instructions (Addendum)
aspercreme as needed   Achilles Tendinitis Tendinitis a swelling and soreness of the tendon. The pain in the tendon (cord-like structure which attaches muscle to bone) is produced by tiny tears and the inflammation present in that tendon. It commonly occurs at the shoulders, heels, and elbows. It is usually caused by overusing the tendon and joint involved. Achilles tendinitis involves the Achilles tendon. This is the large tendon in the back of the leg just above the foot. It attaches the large muscles of the lower leg to the heel bone (called calcaneus).  This diagnosis (learning what is wrong) is made by examination. X-rays will be generally be normal if only tendinitis is present. HOME CARE INSTRUCTIONS   Apply ice to the injury for 15 to 20 minutes, 3 to 4 times per day. Put the ice in a plastic bag and place a towel between the bag of ice and your skin.  Try to avoid use other than gentle range of motion while the tendon is painful. Do not resume use until instructed by your caregiver. Then begin use gradually. Do not increase use to the point of pain. If pain does develop, decrease use and continue the above measures. Gradually increase activities that do not cause discomfort until you gradually achieve normal use.  Only take over-the-counter or prescription medicines for pain, discomfort, or fever as directed by your caregiver. SEEK MEDICAL CARE IF:   Your pain and swelling increase or pain is uncontrolled with medications.  You develop new, unexplained problems (symptoms) or an increase of the symptoms that brought you to your caregiver.  You develop an inability to move your toes or foot, develop warmth and swelling in your foot, or begin running an unexplained temperature. MAKE SURE YOU:   Understand these instructions.  Will watch your condition.  Will get help right away if you are not doing well or get worse. Document Released: 06/15/2005 Document Revised: 11/28/2011 Document  Reviewed: 04/23/2008 [facility] Patient Information 2013 [place], [place].

## 2012-10-10 LAB — BASIC METABOLIC PANEL
BUN: 12 mg/dL (ref 6–23)
CO2: 32 mEq/L (ref 19–32)
Calcium: 9.5 mg/dL (ref 8.4–10.5)
Chloride: 100 mEq/L (ref 96–112)
Creat: 0.73 mg/dL (ref 0.50–1.10)
Glucose, Bld: 164 mg/dL — ABNORMAL HIGH (ref 70–99)
Potassium: 3.8 mEq/L (ref 3.5–5.3)
Sodium: 141 mEq/L (ref 135–145)

## 2012-10-10 LAB — HEMOGLOBIN A1C
Hgb A1c MFr Bld: 8.1 % — ABNORMAL HIGH (ref <5.7)
Mean Plasma Glucose: 186 mg/dL — ABNORMAL HIGH (ref <117)

## 2012-10-10 LAB — LIPID PANEL
Cholesterol: 167 mg/dL (ref 0–200)
HDL: 33 mg/dL — ABNORMAL LOW (ref >39)
LDL Cholesterol: 94 mg/dL (ref 0–99)
Total CHOL/HDL Ratio: 5.1 Ratio
Triglycerides: 200 mg/dL — ABNORMAL HIGH (ref <150)
VLDL: 40 mg/dL (ref 0–40)

## 2012-10-10 LAB — TSH: TSH: 1.549 u[IU]/mL (ref 0.350–4.500)

## 2012-10-10 LAB — HEPATIC FUNCTION PANEL
ALT: 20 U/L (ref 0–35)
AST: 18 U/L (ref 0–37)
Albumin: 4.2 g/dL (ref 3.5–5.2)
Alkaline Phosphatase: 67 U/L (ref 39–117)
Bilirubin, Direct: 0.1 mg/dL (ref 0.0–0.3)
Indirect Bilirubin: 0.2 mg/dL (ref 0.0–0.9)
Total Bilirubin: 0.3 mg/dL (ref 0.3–1.2)
Total Protein: 6.9 g/dL (ref 6.0–8.3)

## 2012-10-10 LAB — PHOSPHORUS: Phosphorus: 2.7 mg/dL (ref 2.3–4.6)

## 2012-10-10 MED ORDER — GLIMEPIRIDE 4 MG PO TABS: 4.0000 mg | ORAL_TABLET | Freq: Two times a day (BID) | ORAL | Status: DC

## 2012-10-10 NOTE — Progress Notes (Signed)
Quick Note:  Patient Informed and voiced understanding.  Sent new RX ______

## 2012-10-14 ENCOUNTER — Encounter: Payer: Self-pay | Admitting: Family Medicine

## 2012-10-14 DIAGNOSIS — M7661 Achilles tendinitis, right leg: Secondary | ICD-10-CM | POA: Insufficient documentation

## 2012-10-14 DIAGNOSIS — M7660 Achilles tendinitis, unspecified leg: Secondary | ICD-10-CM

## 2012-10-14 DIAGNOSIS — M79671 Pain in right foot: Secondary | ICD-10-CM

## 2012-10-14 HISTORY — DX: Pain in right foot: M79.671

## 2012-10-14 HISTORY — DX: Achilles tendinitis, unspecified leg: M76.60

## 2012-10-14 NOTE — Assessment & Plan Note (Signed)
Well controlled no change in meds

## 2012-10-14 NOTE — Assessment & Plan Note (Signed)
Posterior., has been having intermittent pain in right posterior heel since she fell in 4/13. Encouraged ice and aspercreme. Referred for further evaluation but to length of symptoms

## 2012-10-14 NOTE — Progress Notes (Signed)
Patient ID: Eileen Richards, female   DOB: 1961/03/24, 52 y.o.   MRN: [id number] Vondra Aldredge [id number] 03/08/1961 10/14/2012      Progress Note-Follow Up  Subjective  Chief Complaint  Chief Complaint  Patient presents with  . Follow-up    3 month    HPI  Patient is a 52 year old AA female who is in today for three-month followup. She reports her blood sugars been trending upwards again. She's having several numbers in the 300s the lowest number of 169. Her largest complaint today is of heel pain. She said she fell on her way out of work back in April 2013 is had trouble he'll present. Pain is worse and better but recently has been worsening. Pain is along the posterior aspect of the heel. No pain on the base of the foot. No swelling or warmth. Pain is becoming bad enough that she is having difficulty working. No other acute complaints. She did undergo a hysterectomy back in November of 2013 but is recovered nicely from that. No fevers, chest pain, palpitations, shortness of breath, GI or GU complaints noted today.  Past Medical History  Diagnosis Date  . Diabetes mellitus   . Hypertension   . Hyperlipemia   . Atypical chest pain     normal stress echo 02/2009 -LV size was normal LV global systolic function was normal- Normal wall motion; no LV regional wall motion abnormalities  . Asthma     childhood  . GERD (gastroesophageal reflux disease)   . Pain of right heel 10/14/2012    Past Surgical History  Procedure Date  . Abdominal hysterectomy   . Dilation and curettage of uterus     Lysis of Adhesions  . Robotic assisted salpingo oopherectomy 07/23/2012    Procedure: ROBOTIC ASSISTED SALPINGO OOPHERECTOMY;  Surgeon: Serita Kyle, MD;  Location: [facility];  Service: Gynecology;  Laterality: Bilateral;  . Robotic assisted laparoscopic lysis of adhesion 07/23/2012    Procedure: ROBOTIC ASSISTED LAPAROSCOPIC LYSIS OF ADHESION;  Surgeon: Serita Kyle, MD;  Location: [facility];  Service: Gynecology;  Laterality: N/A;  . Cystoscopy 07/23/2012    Procedure: CYSTOSCOPY;  Surgeon: Serita Kyle, MD;  Location: [facility];  Service: Gynecology;  Laterality: N/A;    Family History  Problem Relation Age of Onset  . GER disease Mother   . GER disease Father   . Heart disease Father     father has "heart problems"    History   Social History  . Marital Status: Single    Spouse Name: N/A    Number of Children: N/A  . Years of Education: N/A   Occupational History  . Not on file.   Social History Main Topics  . Smoking status: Former Games developer  . Smokeless tobacco: Never Used  . Alcohol Use: No  . Drug Use: No  . Sexually Active: Not on file   Other Topics Concern  . Not on file   Social History Narrative  . No narrative on file    Current Outpatient Prescriptions on File Prior to Visit  Medication Sig Dispense Refill  . albuterol (PROVENTIL) 90 MCG/ACT inhaler Inhale 2 puffs into the lungs every 6 (six) hours as needed for wheezing.  17 g  3  . Blood Glucose Monitoring Suppl (TRUETRACK BLOOD GLUCOSE) W/DEVICE KIT by Does not apply route.        Marland Kitchen estradiol (CLIMARA - DOSED IN MG/24 HR) 0.1 mg/24hr Place 1 patch (0.1 mg total) onto the  skin once a week.  4 patch  11  . glimepiride (AMARYL) 4 MG tablet Take 1 tablet (4 mg total) by mouth 2 (two) times daily.  60 tablet  3  . hydrochlorothiazide (HYDRODIURIL) 25 MG tablet Take 1 tablet (25 mg total) by mouth daily.  90 tablet  2  . isoniazid (NYDRAZID) 300 MG tablet Take 300 mg by mouth daily.      Demetra Shiner Device MISC by Does not apply route. Use to test blood sugar three times daily       . linagliptin (TRADJENTA) 5 MG TABS tablet Take 1 tablet (5 mg total) by mouth daily.  90 tablet  2  . metFORMIN (GLUCOPHAGE-XR) 500 MG 24 hr tablet TAKE 2 TABLETS BY MOUTH TWICE DAILY  120 tablet  6  . Multiple Vitamin (MULTIVITAMIN) tablet Take 1 tablet by mouth daily.        . nebivolol (BYSTOLIC) 5 MG tablet  Take 1 tablet (5 mg total) by mouth daily.  90 tablet  2  . pantoprazole (PROTONIX) 40 MG tablet Take 1 tablet (40 mg total) by mouth daily.  30 tablet  6  . simvastatin (ZOCOR) 20 MG tablet Take 1 tablet (20 mg total) by mouth at bedtime.  90 tablet  1  . TRUETRACK TEST test strip USE TO TEST BLOOD SUGAR THREE TIMES A DAY (250.00)  1 each  3    No Known Allergies  Review of Systems  Review of Systems  Constitutional: Negative for fever and malaise/fatigue.  HENT: Negative for congestion.   Eyes: Negative for discharge.  Respiratory: Negative for shortness of breath.   Cardiovascular: Negative for chest pain, palpitations and leg swelling.  Gastrointestinal: Negative for nausea, abdominal pain and diarrhea.  Genitourinary: Negative for dysuria.  Musculoskeletal: Positive for joint pain. Negative for falls.       Right heel pain, b/l knee pain  Skin: Negative for rash.  Neurological: Negative for loss of consciousness and headaches.  Endo/Heme/Allergies: Negative for polydipsia.  Psychiatric/Behavioral: Negative for depression and suicidal ideas. The patient is not nervous/anxious and does not have insomnia.     Objective  BP 134/80  Pulse 83  Temp 98.3 F (36.8 C) (Oral)  Ht 5\' 4"  (1.626 m)  Wt 254 lb 1.3 oz (115.25 kg)  BMI 43.61 kg/m2  SpO2 95%  Physical Exam  Physical Exam  Constitutional: She is oriented to person, place, and time and well-developed, well-nourished, and in no distress. No distress.  HENT:  Head: Normocephalic and atraumatic.  Eyes: Conjunctivae normal are normal.  Neck: Neck supple. No thyromegaly present.  Cardiovascular: Normal rate, regular rhythm and normal heart sounds.   No murmur heard. Pulmonary/Chest: Effort normal and breath sounds normal. She has no wheezes.  Abdominal: Soft. Bowel sounds are normal. She exhibits no distension and no mass. There is no tenderness.  Musculoskeletal: She exhibits tenderness. She exhibits no edema.        Pain with palp over posterior heel but no erythema, swelling or warmth.  Lymphadenopathy:    She has no cervical adenopathy.  Neurological: She is alert and oriented to person, place, and time.  Skin: Skin is warm and dry. No rash noted. She is not diaphoretic.  Psychiatric: Memory, affect and judgment normal.    Lab Results  Component Value Date   TSH 1.549 10/09/2012   Lab Results  Component Value Date   WBC 10.5 10/09/2012   HGB 13.8 10/09/2012   HCT 39.7 10/09/2012  MCV 86.9 10/09/2012   PLT 431* 10/09/2012   Lab Results  Component Value Date   CREATININE 0.73 10/09/2012   BUN 12 10/09/2012   NA 141 10/09/2012   K 3.8 10/09/2012   CL 100 10/09/2012   CO2 32 10/09/2012   Lab Results  Component Value Date   ALT 20 10/09/2012   AST 18 10/09/2012   ALKPHOS 67 10/09/2012   BILITOT 0.3 10/09/2012   Lab Results  Component Value Date   CHOL 167 10/09/2012   Lab Results  Component Value Date   HDL 33* 10/09/2012   Lab Results  Component Value Date   LDLCALC 94 10/09/2012   Lab Results  Component Value Date   TRIG 200* 10/09/2012   Lab Results  Component Value Date   CHOLHDL 5.1 10/09/2012     Assessment & Plan  DIABETES MELLITUS, TYPE II Poorly controlled, minimize simple carbs and increase Amaryl to bid. Report persistently hi blood sugars, check BS in am and prn.  HYPERTENSION Well controlled no change in meds  Pain of right heel Posterior., has been having intermittent pain in right posterior heel since she fell in 4/13. Encouraged ice and aspercreme. Referred for further evaluation but to length of symptoms

## 2012-10-14 NOTE — Assessment & Plan Note (Signed)
Poorly controlled, minimize simple carbs and increase Amaryl to bid. Report persistently hi blood sugars, check BS in am and prn.

## 2012-10-18 ENCOUNTER — Ambulatory Visit: Payer: Self-pay | Admitting: Internal Medicine

## 2012-10-23 ENCOUNTER — Ambulatory Visit: Payer: 59 | Attending: Sports Medicine | Admitting: Physical Therapy

## 2012-10-23 DIAGNOSIS — IMO0001 Reserved for inherently not codable concepts without codable children: Secondary | ICD-10-CM | POA: Insufficient documentation

## 2012-10-23 DIAGNOSIS — M25579 Pain in unspecified ankle and joints of unspecified foot: Secondary | ICD-10-CM | POA: Insufficient documentation

## 2012-10-29 ENCOUNTER — Ambulatory Visit: Payer: 59 | Admitting: Physical Therapy

## 2012-11-06 ENCOUNTER — Ambulatory Visit: Payer: 59 | Admitting: Rehabilitation

## 2012-11-08 ENCOUNTER — Ambulatory Visit: Payer: 59 | Admitting: Physical Therapy

## 2012-11-13 ENCOUNTER — Ambulatory Visit: Payer: 59 | Admitting: Physical Therapy

## 2012-11-15 ENCOUNTER — Ambulatory Visit: Payer: 59 | Admitting: Physical Therapy

## 2012-11-16 ENCOUNTER — Telehealth: Payer: Self-pay | Admitting: Internal Medicine

## 2012-11-16 ENCOUNTER — Other Ambulatory Visit: Payer: Self-pay | Admitting: Internal Medicine

## 2012-11-16 DIAGNOSIS — E785 Hyperlipidemia, unspecified: Secondary | ICD-10-CM

## 2012-11-16 MED ORDER — SIMVASTATIN 20 MG PO TABS: 20.0000 mg | ORAL_TABLET | Freq: Every day | ORAL | Status: DC

## 2012-11-16 MED ORDER — NEBIVOLOL HCL 5 MG PO TABS: 5.0000 mg | ORAL_TABLET | Freq: Every day | ORAL | Status: DC

## 2012-11-16 MED ORDER — LINAGLIPTIN 5 MG PO TABS: 5.0000 mg | ORAL_TABLET | Freq: Every day | ORAL | Status: DC

## 2012-11-16 MED ORDER — HYDROCHLOROTHIAZIDE 25 MG PO TABS: 25.0000 mg | ORAL_TABLET | Freq: Every day | ORAL | Status: DC

## 2012-11-16 NOTE — Telephone Encounter (Signed)
Refill  Hydrochlorothiazide 25 mg qty 90  tradjenta 5 mg tab qty 30 bystolic 5 mg tablet qty 30 simvastatin 20 mg tablet qty 90

## 2012-11-19 NOTE — Telephone Encounter (Signed)
See 11/16/12 phone note.

## 2012-11-20 ENCOUNTER — Ambulatory Visit: Payer: 59 | Admitting: Physical Therapy

## 2012-11-22 ENCOUNTER — Encounter: Payer: Self-pay | Admitting: Physical Therapy

## 2012-11-23 ENCOUNTER — Encounter: Payer: Self-pay | Admitting: Physical Therapy

## 2012-11-27 ENCOUNTER — Encounter: Payer: Self-pay | Admitting: Physical Therapy

## 2012-11-29 ENCOUNTER — Encounter: Payer: Self-pay | Admitting: Physical Therapy

## 2013-01-24 ENCOUNTER — Ambulatory Visit (INDEPENDENT_AMBULATORY_CARE_PROVIDER_SITE_OTHER): Payer: 59 | Admitting: Family Medicine

## 2013-01-24 ENCOUNTER — Encounter: Payer: Self-pay | Admitting: Family Medicine

## 2013-01-24 VITALS — BP 118/82 | HR 66 | Temp 98.2°F | Ht 64.0 in | Wt 250.1 lb

## 2013-01-24 DIAGNOSIS — E669 Obesity, unspecified: Secondary | ICD-10-CM

## 2013-01-24 DIAGNOSIS — M7661 Achilles tendinitis, right leg: Secondary | ICD-10-CM

## 2013-01-24 DIAGNOSIS — M7660 Achilles tendinitis, unspecified leg: Secondary | ICD-10-CM

## 2013-01-24 DIAGNOSIS — I10 Essential (primary) hypertension: Secondary | ICD-10-CM

## 2013-01-24 DIAGNOSIS — E119 Type 2 diabetes mellitus without complications: Secondary | ICD-10-CM

## 2013-01-24 DIAGNOSIS — R252 Cramp and spasm: Secondary | ICD-10-CM

## 2013-01-24 LAB — RENAL FUNCTION PANEL
Albumin: 4.1 g/dL (ref 3.5–5.2)
BUN: 13 mg/dL (ref 6–23)
CO2: 32 mEq/L (ref 19–32)
Calcium: 9.8 mg/dL (ref 8.4–10.5)
Chloride: 96 mEq/L (ref 96–112)
Creat: 0.84 mg/dL (ref 0.50–1.10)
Glucose, Bld: 208 mg/dL — ABNORMAL HIGH (ref 70–99)
Phosphorus: 3.3 mg/dL (ref 2.3–4.6)
Potassium: 4.2 mEq/L (ref 3.5–5.3)
Sodium: 139 mEq/L (ref 135–145)

## 2013-01-24 LAB — MAGNESIUM: Magnesium: 1.9 mg/dL (ref 1.5–2.5)

## 2013-01-24 MED ORDER — SITAGLIPTIN PHOSPHATE 100 MG PO TABS: 100.0000 mg | ORAL_TABLET | Freq: Every day | ORAL | Status: DC

## 2013-01-24 NOTE — Patient Instructions (Addendum)
Labs prior lipid, renal, cbc, tsh, hgba1c, hepatic  Diabetes and Exercise Regular exercise is important and can help:   Control blood glucose (sugar).  Decrease blood pressure.    Control blood lipids (cholesterol, triglycerides).  Improve overall health. BENEFITS FROM EXERCISE  Improved fitness.  Improved flexibility.  Improved endurance.  Increased bone density.  Weight control.  Increased muscle strength.  Decreased body fat.  Improvement of the body's use of insulin, a hormone.  Increased insulin sensitivity.  Reduction of insulin needs.  Reduced stress and tension.  Helps you feel better. People with diabetes who add exercise to their lifestyle gain additional benefits, including:  Weight loss.  Reduced appetite.  Improvement of the body's use of blood glucose.  Decreased risk factors for heart disease:  Lowering of cholesterol and triglycerides.  Raising the level of good cholesterol (high-density lipoproteins, HDL).  Lowering blood sugar.  Decreased blood pressure. TYPE 1 DIABETES AND EXERCISE  Exercise will usually lower your blood glucose.  If blood glucose is greater than 240 mg/dl, check urine ketones. If ketones are present, do not exercise.  Location of the insulin injection sites may need to be adjusted with exercise. Avoid injecting insulin into areas of the body that will be exercised. For example, avoid injecting insulin into:  The arms when playing tennis.  The legs when jogging. For more information, discuss this with your caregiver.  Keep a record of:  Food intake.  Type and amount of exercise.  Expected peak times of insulin action.  Blood glucose levels. Do this before, during, and after exercise. Review your records with your caregiver. This will help you to develop guidelines for adjusting food intake and insulin amounts.  TYPE 2 DIABETES AND EXERCISE  Regular physical activity can help control blood  glucose.  Exercise is important because it may:  Increase the body's sensitivity to insulin.  Improve blood glucose control.  Exercise reduces the risk of heart disease. It decreases serum cholesterol and triglycerides. It also lowers blood pressure.  Those who take insulin or oral hypoglycemic agents should watch for signs of hypoglycemia. These signs include dizziness, shaking, sweating, chills, and confusion.  Body water is lost during exercise. It must be replaced. This will help to avoid loss of body fluids (dehydration) or heat stroke. Be sure to talk to your caregiver before starting an exercise program to make sure it is safe for you. Remember, any activity is better than none.  Document Released: 11/26/2003 Document Revised: 11/28/2011 Document Reviewed: 03/12/2009 [facility] Patient Information 2013 [place], [place].

## 2013-01-25 ENCOUNTER — Encounter: Payer: Self-pay | Admitting: *Deleted

## 2013-01-27 ENCOUNTER — Encounter: Payer: Self-pay | Admitting: Family Medicine

## 2013-01-27 NOTE — Assessment & Plan Note (Signed)
Elevation of hgba1c, encouraged DASH diet, minimize simple carbs. Has been able to take her Tradjenta due to cost, will start Januvia which has a better copay for her.

## 2013-01-27 NOTE — Assessment & Plan Note (Signed)
Encouraged ice and aspercreme, continue stretching. May have handicap placard for now while she works on this.

## 2013-01-27 NOTE — Assessment & Plan Note (Signed)
Encouraged DASH diet, increased activity and decreased po intake

## 2013-01-27 NOTE — Progress Notes (Signed)
Patient ID: Eileen Richards, female   DOB: [birth year], 52 y.o.   MRN: [id number] Suheily Birks [id number] 1960/11/02 01/27/2013      Progress Note-Follow Up  Subjective  Chief Complaint  Chief Complaint  Patient presents with  . Follow-up    3 month    HPI  Patient is a 52 year old Afro-American female who is in today in followup. She has not been taking her Tradjenta due to cost. As a result she's been struggling with polyuria or polydipsia. Has seen blood sugars ranging from the high 100s to 300s. She is struggling with chronic pain as well. She works on her feet many hours a day. Has knee pain et Karie Soda but notably has had an increase in right heel pain over the last several months. It is somewhat improved since last visit but still persistent. No chest pain or palpitations. Some mild dyspnea with exertion but nothing worsening. No fevers or chills, congestion or other concerns noted today   Past Medical History  Diagnosis Date  . Diabetes mellitus   . Hypertension   . Hyperlipemia   . Atypical chest pain     normal stress echo 02/2009 -LV size was normal LV global systolic function was normal- Normal wall motion; no LV regional wall motion abnormalities  . Asthma     childhood  . GERD (gastroesophageal reflux disease)   . Pain of right heel 10/14/2012  . Achilles tendonitis 10/14/2012    Past Surgical History  Procedure Laterality Date  . Abdominal hysterectomy    . Dilation and curettage of uterus      Lysis of Adhesions  . Robotic assisted salpingo oopherectomy  07/23/2012    Procedure: ROBOTIC ASSISTED SALPINGO OOPHERECTOMY;  Surgeon: Serita Kyle, MD;  Location: [facility];  Service: Gynecology;  Laterality: Bilateral;  . Robotic assisted laparoscopic lysis of adhesion  07/23/2012    Procedure: ROBOTIC ASSISTED LAPAROSCOPIC LYSIS OF ADHESION;  Surgeon: Serita Kyle, MD;  Location: [facility];  Service: Gynecology;  Laterality: N/A;  . Cystoscopy  07/23/2012   Procedure: CYSTOSCOPY;  Surgeon: Serita Kyle, MD;  Location: [facility];  Service: Gynecology;  Laterality: N/A;    Family History  Problem Relation Age of Onset  . GER disease Mother   . GER disease Father   . Heart disease Father     father has "heart problems"    History   Social History  . Marital Status: Single    Spouse Name: N/A    Number of Children: N/A  . Years of Education: N/A   Occupational History  . Not on file.   Social History Main Topics  . Smoking status: Former Games developer  . Smokeless tobacco: Never Used  . Alcohol Use: No  . Drug Use: No  . Sexually Active: Not on file   Other Topics Concern  . Not on file   Social History Narrative  . No narrative on file    Current Outpatient Prescriptions on File Prior to Visit  Medication Sig Dispense Refill  . albuterol (PROVENTIL) 90 MCG/ACT inhaler Inhale 2 puffs into the lungs every 6 (six) hours as needed for wheezing.  17 g  3  . Blood Glucose Monitoring Suppl (TRUETRACK BLOOD GLUCOSE) W/DEVICE KIT by Does not apply route.        Marland Kitchen estradiol (CLIMARA - DOSED IN MG/24 HR) 0.1 mg/24hr Place 1 patch (0.1 mg total) onto the skin once a week.  4 patch  11  . glimepiride (AMARYL) 4  MG tablet Take 1 tablet (4 mg total) by mouth 2 (two) times daily.  60 tablet  3  . hydrochlorothiazide (HYDRODIURIL) 25 MG tablet Take 1 tablet (25 mg total) by mouth daily.  90 tablet  0  . Lancet Device MISC by Does not apply route. Use to test blood sugar three times daily       . metFORMIN (GLUCOPHAGE-XR) 500 MG 24 hr tablet TAKE 2 TABLETS BY MOUTH TWICE DAILY  120 tablet  6  . Multiple Vitamin (MULTIVITAMIN) tablet Take 1 tablet by mouth daily.        . nebivolol (BYSTOLIC) 5 MG tablet Take 1 tablet (5 mg total) by mouth daily.  30 tablet  2  . pantoprazole (PROTONIX) 40 MG tablet Take 1 tablet (40 mg total) by mouth daily.  30 tablet  6  . simvastatin (ZOCOR) 20 MG tablet Take 1 tablet (20 mg total) by mouth at bedtime.  90  tablet  0  . traMADol (ULTRAM) 50 MG tablet Take 1 tablet (50 mg total) by mouth 2 (two) times daily as needed for pain.  60 tablet  1  . TRUETRACK TEST test strip USE TO TEST BLOOD SUGAR THREE TIMES A DAY (250.00)  1 each  3   No current facility-administered medications on file prior to visit.    No Known Allergies  Review of Systems  Review of Systems  Constitutional: Negative for fever and malaise/fatigue.  HENT: Negative for congestion.   Eyes: Negative for discharge.  Respiratory: Positive for shortness of breath.   Cardiovascular: Negative for chest pain, palpitations and leg swelling.  Gastrointestinal: Negative for nausea, abdominal pain and diarrhea.  Genitourinary: Positive for frequency. Negative for dysuria and hematuria.  Musculoskeletal: Positive for joint pain. Negative for falls.  Skin: Negative for rash.  Neurological: Negative for loss of consciousness and headaches.  Endo/Heme/Allergies: Positive for polydipsia.  Psychiatric/Behavioral: Negative for depression and suicidal ideas. The patient is not nervous/anxious and does not have insomnia.     Objective  BP 118/82  Pulse 66  Temp(Src) 98.2 F (36.8 C) (Oral)  Ht 5\' 4"  (1.626 m)  Wt 250 lb 1.3 oz (113.436 kg)  BMI 42.91 kg/m2  SpO2 98%  Physical Exam  Physical Exam  Constitutional: She is oriented to person, place, and time and well-developed, well-nourished, and in no distress. No distress.  HENT:  Head: Normocephalic and atraumatic.  Eyes: Conjunctivae are normal.  Neck: Neck supple. No thyromegaly present.  Cardiovascular: Normal rate, regular rhythm and normal heart sounds.   No murmur heard. Pulmonary/Chest: Effort normal and breath sounds normal. She has no wheezes.  Abdominal: She exhibits no distension and no mass.  Musculoskeletal: She exhibits no edema.  Lymphadenopathy:    She has no cervical adenopathy.  Neurological: She is alert and oriented to person, place, and time.  Skin:  Skin is warm and dry. No rash noted. She is not diaphoretic.  Psychiatric: Memory, affect and judgment normal.    Lab Results  Component Value Date   TSH 1.549 10/09/2012   Lab Results  Component Value Date   WBC 10.5 10/09/2012   HGB 13.8 10/09/2012   HCT 39.7 10/09/2012   MCV 86.9 10/09/2012   PLT 431* 10/09/2012   Lab Results  Component Value Date   CREATININE 0.84 01/24/2013   BUN 13 01/24/2013   NA 139 01/24/2013   K 4.2 01/24/2013   CL 96 01/24/2013   CO2 32 01/24/2013   Lab Results  Component Value Date   ALT 20 10/09/2012   AST 18 10/09/2012   ALKPHOS 67 10/09/2012   BILITOT 0.3 10/09/2012   Lab Results  Component Value Date   CHOL 167 10/09/2012   Lab Results  Component Value Date   HDL 33* 10/09/2012   Lab Results  Component Value Date   LDLCALC 94 10/09/2012   Lab Results  Component Value Date   TRIG 200* 10/09/2012   Lab Results  Component Value Date   CHOLHDL 5.1 10/09/2012     Assessment & Plan  HYPERTENSION Well controlled, no changes to meds.   DIABETES MELLITUS, TYPE II Elevation of hgba1c, encouraged DASH diet, minimize simple carbs. Has been able to take her Tradjenta due to cost, will start Januvia which has a better copay for her.  OBESITY, NOS Encouraged DASH diet, increased activity and decreased po intake  Achilles tendonitis Encouraged ice and aspercreme, continue stretching. May have handicap placard for now while she works on this.

## 2013-01-27 NOTE — Assessment & Plan Note (Signed)
Well controlled, no changes to meds 

## 2013-02-01 ENCOUNTER — Telehealth: Payer: Self-pay | Admitting: *Deleted

## 2013-02-01 MED ORDER — ALBUTEROL SULFATE HFA 108 (90 BASE) MCG/ACT IN AERS: 2.0000 | INHALATION_SPRAY | Freq: Four times a day (QID) | RESPIRATORY_TRACT | Status: DC | PRN

## 2013-02-01 NOTE — Telephone Encounter (Signed)
Received call from [facility] pharmacy that they have been e-scribing requesting to [place] for pt's ventolin. Upon review of EPIC I do not see prior communicaiton.  Pt's med list has Proventil. Pt last received Ventolin 18g 2 puffs every 6 hours as needed.  Please advise re: refill.

## 2013-02-01 NOTE — Telephone Encounter (Signed)
rx sent

## 2013-02-12 ENCOUNTER — Other Ambulatory Visit: Payer: Self-pay | Admitting: Family Medicine

## 2013-02-12 ENCOUNTER — Other Ambulatory Visit: Payer: Self-pay | Admitting: Internal Medicine

## 2013-02-15 ENCOUNTER — Telehealth: Payer: Self-pay

## 2013-02-15 NOTE — Telephone Encounter (Signed)
So I double checked my drug reference because I have never seen this happen and the drug reference confirmed rectal bleeding not seen as side effect. That being said if this is persistent or extensive this needs to be evaluated. Can bring her in next week and she should go to the ED over the weekend if it gets worse

## 2013-02-15 NOTE — Telephone Encounter (Signed)
Pt states that she started Januvia at her last visit and a few days ago she noticed blood in her stool and for the past week her lower back has been hurting. Pt is wandering if this has anything to do with the Januvia? Please advise?  Pt is at work pager number is [phone]

## 2013-02-15 NOTE — Telephone Encounter (Signed)
Left message for patient to return call.

## 2013-03-06 ENCOUNTER — Other Ambulatory Visit: Payer: Self-pay | Admitting: Family Medicine

## 2013-03-19 ENCOUNTER — Encounter: Payer: Self-pay | Admitting: Family Medicine

## 2013-03-26 ENCOUNTER — Other Ambulatory Visit: Payer: Self-pay | Admitting: Internal Medicine

## 2013-03-28 ENCOUNTER — Other Ambulatory Visit: Payer: Self-pay

## 2013-05-02 ENCOUNTER — Encounter: Payer: Self-pay | Admitting: Family Medicine

## 2013-05-02 ENCOUNTER — Ambulatory Visit (INDEPENDENT_AMBULATORY_CARE_PROVIDER_SITE_OTHER): Payer: 59 | Admitting: Family Medicine

## 2013-05-02 VITALS — BP 112/68 | HR 80 | Temp 98.3°F | Ht 65.0 in | Wt 251.1 lb

## 2013-05-02 DIAGNOSIS — E785 Hyperlipidemia, unspecified: Secondary | ICD-10-CM

## 2013-05-02 DIAGNOSIS — E669 Obesity, unspecified: Secondary | ICD-10-CM

## 2013-05-02 DIAGNOSIS — E119 Type 2 diabetes mellitus without complications: Secondary | ICD-10-CM

## 2013-05-02 DIAGNOSIS — I10 Essential (primary) hypertension: Secondary | ICD-10-CM

## 2013-05-02 LAB — LIPID PANEL
Cholesterol: 193 mg/dL (ref 0–200)
HDL: 36 mg/dL — ABNORMAL LOW (ref >39)
LDL Cholesterol: 98 mg/dL (ref 0–99)
Total CHOL/HDL Ratio: 5.4 Ratio
Triglycerides: 294 mg/dL — ABNORMAL HIGH (ref <150)
VLDL: 59 mg/dL — ABNORMAL HIGH (ref 0–40)

## 2013-05-02 NOTE — Patient Instructions (Addendum)
Can try 2 Tramadol for foot pain, let us know it helps or does not.  Salon Pas cream  As needed for pain DASH diet Achilles Tendinitis Tendinitis a swelling and soreness of the tendon. The pain in the tendon (cord-like structure which attaches muscle to bone) is produced by tiny tears and the inflammation present in that tendon. It commonly occurs at the shoulders, heels, and elbows. It is usually caused by overusing the tendon and joint involved. Achilles tendinitis involves the Achilles tendon. This is the large tendon in the back of the leg just above the foot. It attaches the large muscles of the lower leg to the heel bone (called calcaneus).  This diagnosis (learning what is wrong) is made by examination. X-rays will be generally be normal if only tendinitis is present. HOME CARE INSTRUCTIONS   Apply ice to the injury for 15-20 minutes, 3-4 times per day. Put the ice in a plastic bag and place a towel between the bag of ice and your skin.  Try to avoid use other than gentle range of motion while the tendon is painful. Do not resume use until instructed by your caregiver. Then begin use gradually. Do not increase use to the point of pain. If pain does develop, decrease use and continue the above measures. Gradually increase activities that do not cause discomfort until you gradually achieve normal use.  Only take over-the-counter or prescription medicines for pain, discomfort, or fever as directed by your caregiver. SEEK MEDICAL CARE IF:   Your pain and swelling increase or pain is uncontrolled with medications.  You develop new, unexplained problems (symptoms) or an increase of the symptoms that brought you to your caregiver.  You develop an inability to move your toes or foot, develop warmth and swelling in your foot, or begin running an unexplained temperature. MAKE SURE YOU:   Understand these instructions.  Will watch your condition.  Will get help right away if you are not doing  well or get worse. Document Released: 06/15/2005 Document Revised: 11/28/2011 Document Reviewed: 04/23/2008 [facility] Patient Information 2014 [place], [place].

## 2013-05-03 LAB — HEMOGLOBIN A1C
Hgb A1c MFr Bld: 7.3 % — ABNORMAL HIGH (ref <5.7)
Mean Plasma Glucose: 163 mg/dL — ABNORMAL HIGH (ref <117)

## 2013-05-05 ENCOUNTER — Encounter: Payer: Self-pay | Admitting: Family Medicine

## 2013-05-05 NOTE — Assessment & Plan Note (Signed)
Encouraged DASH diet and increased activity

## 2013-05-05 NOTE — Assessment & Plan Note (Signed)
Add welchol continue other meds. Avoid trans fats.

## 2013-05-05 NOTE — Progress Notes (Signed)
Patient ID: Eileen Richards, female   DOB: 08-30-1961, 52 y.o.   MRN: [id number] Adonna Horsley [id number] 1960/09/20 05/05/2013      Progress Note-Follow Up  Subjective  Chief Complaint  Chief Complaint  Patient presents with  . Follow-up    3 month    HPI  Patient is a 52 year old African  American female in today for followup. She denies any recent illness. She has had some recent trouble with LLQ pain, she has seen her gyn and they have not been able to find any significant cause for her discomfort. She notes her blood sugars are somewhat improved. She's eating a lot of numbers around 150 and 160. Denies any polyuria or polydipsia. No shortness of breath, chest pain, palpitations, GI complaints. No fevers or chills.rouble  Past Medical History  Diagnosis Date  . Diabetes mellitus   . Hypertension   . Hyperlipemia   . Atypical chest pain     normal stress echo 02/2009 -LV size was normal LV global systolic function was normal- Normal wall motion; no LV regional wall motion abnormalities  . Asthma     childhood  . GERD (gastroesophageal reflux disease)   . Pain of right heel 10/14/2012  . Achilles tendonitis 10/14/2012    Past Surgical History  Procedure Laterality Date  . Abdominal hysterectomy    . Dilation and curettage of uterus      Lysis of Adhesions  . Robotic assisted salpingo oopherectomy  07/23/2012    Procedure: ROBOTIC ASSISTED SALPINGO OOPHERECTOMY;  Surgeon: Serita Kyle, MD;  Location: [facility];  Service: Gynecology;  Laterality: Bilateral;  . Robotic assisted laparoscopic lysis of adhesion  07/23/2012    Procedure: ROBOTIC ASSISTED LAPAROSCOPIC LYSIS OF ADHESION;  Surgeon: Serita Kyle, MD;  Location: [facility];  Service: Gynecology;  Laterality: N/A;  . Cystoscopy  07/23/2012    Procedure: CYSTOSCOPY;  Surgeon: Serita Kyle, MD;  Location: [facility];  Service: Gynecology;  Laterality: N/A;    Family History  Problem Relation Age of Onset  .  GER disease Mother   . GER disease Father   . Heart disease Father     father has "heart problems"    History   Social History  . Marital Status: Single    Spouse Name: N/A    Number of Children: N/A  . Years of Education: N/A   Occupational History  . Not on file.   Social History Main Topics  . Smoking status: Former Games developer  . Smokeless tobacco: Never Used  . Alcohol Use: No  . Drug Use: No  . Sexual Activity: Not on file   Other Topics Concern  . Not on file   Social History Narrative  . No narrative on file    Current Outpatient Prescriptions on File Prior to Visit  Medication Sig Dispense Refill  . albuterol (PROVENTIL HFA;VENTOLIN HFA) 108 (90 BASE) MCG/ACT inhaler Inhale 2 puffs into the lungs every 6 (six) hours as needed for wheezing.  1 Inhaler  2  . Blood Glucose Monitoring Suppl (TRUETRACK BLOOD GLUCOSE) W/DEVICE KIT by Does not apply route.        Marland Kitchen glimepiride (AMARYL) 4 MG tablet Take 1 tablet (4 mg total) by mouth 2 (two) times daily.  60 tablet  3  . hydrochlorothiazide (HYDRODIURIL) 25 MG tablet TAKE 1 TABLET BY MOUTH DAILY.  90 tablet  1  . Lancet Device MISC by Does not apply route. Use to test blood sugar three times daily       .  metFORMIN (GLUCOPHAGE-XR) 500 MG 24 hr tablet TAKE 2 TABLETS BY MOUTH TWICE DAILY  120 tablet  2  . Multiple Vitamin (MULTIVITAMIN) tablet Take 1 tablet by mouth daily.        . nebivolol (BYSTOLIC) 5 MG tablet Take 1 tablet (5 mg total) by mouth daily.  30 tablet  PRN  . pantoprazole (PROTONIX) 40 MG tablet TAKE 1 TABLET BY MOUTH DAILY  30 tablet  3  . simvastatin (ZOCOR) 20 MG tablet Take 1 tablet (20 mg total) by mouth at bedtime.  90 tablet  0  . sitaGLIPtin (JANUVIA) 100 MG tablet Take 1 tablet (100 mg total) by mouth daily.  30 tablet  3  . traMADol (ULTRAM) 50 MG tablet Take 1 tablet (50 mg total) by mouth 2 (two) times daily as needed for pain.  60 tablet  1  . TRUETRACK TEST test strip USE TO TEST BLOOD SUGAR THREE  TIMES A DAY (250.00)  1 each  3   No current facility-administered medications on file prior to visit.    No Known Allergies  Review of Systems  Review of Systems  Constitutional: Negative for fever and malaise/fatigue.  HENT: Negative for congestion.   Eyes: Negative for discharge.  Respiratory: Negative for shortness of breath.   Cardiovascular: Negative for chest pain, palpitations and leg swelling.  Gastrointestinal: Negative for nausea, abdominal pain and diarrhea.  Genitourinary: Negative for dysuria.  Musculoskeletal: Negative for falls.  Skin: Negative for rash.  Neurological: Negative for loss of consciousness and headaches.  Endo/Heme/Allergies: Negative for polydipsia.  Psychiatric/Behavioral: Negative for depression and suicidal ideas. The patient is not nervous/anxious and does not have insomnia.     Objective  BP 112/68  Pulse 80  Temp(Src) 98.3 F (36.8 C) (Oral)  Ht 5\' 5"  (1.651 m)  Wt 251 lb 1.3 oz (113.889 kg)  BMI 41.78 kg/m2  SpO2 97%  Physical Exam  Physical Exam  Constitutional: She is oriented to person, place, and time and well-developed, well-nourished, and in no distress. No distress.  HENT:  Head: Normocephalic and atraumatic.  Eyes: Conjunctivae are normal.  Neck: Neck supple. No thyromegaly present.  Cardiovascular: Normal rate, regular rhythm and normal heart sounds.   No murmur heard. Pulmonary/Chest: Effort normal and breath sounds normal. She has no wheezes.  Abdominal: She exhibits no distension and no mass.  Musculoskeletal: She exhibits no edema.  Lymphadenopathy:    She has no cervical adenopathy.  Neurological: She is alert and oriented to person, place, and time.  Skin: Skin is warm and dry. No rash noted. She is not diaphoretic.  Psychiatric: Memory, affect and judgment normal.    Lab Results  Component Value Date   TSH 1.549 10/09/2012   Lab Results  Component Value Date   WBC 10.5 10/09/2012   HGB 13.8 10/09/2012    HCT 39.7 10/09/2012   MCV 86.9 10/09/2012   PLT 431* 10/09/2012   Lab Results  Component Value Date   CREATININE 0.84 01/24/2013   BUN 13 01/24/2013   NA 139 01/24/2013   K 4.2 01/24/2013   CL 96 01/24/2013   CO2 32 01/24/2013   Lab Results  Component Value Date   ALT 20 10/09/2012   AST 18 10/09/2012   ALKPHOS 67 10/09/2012   BILITOT 0.3 10/09/2012   Lab Results  Component Value Date   CHOL 193 05/02/2013   Lab Results  Component Value Date   HDL 36* 05/02/2013   Lab Results  Component Value  Date   LDLCALC 98 05/02/2013   Lab Results  Component Value Date   TRIG 294* 05/02/2013   Lab Results  Component Value Date   CHOLHDL 5.4 05/02/2013     Assessment & Plan  HYPERTENSION Well controlled, no changes. Minimize  sodium  DIABETES MELLITUS, TYPE II Improved some, avoid simple carbs. Increase exercise, add Welchol  HYPERLIPIDEMIA Add welchol continue other meds. Avoid trans fats.  OBESITY, NOS Encouraged DASH diet and increased activity

## 2013-05-05 NOTE — Assessment & Plan Note (Signed)
Well controlled, no changes. Minimize sodium 

## 2013-05-05 NOTE — Assessment & Plan Note (Signed)
Improved some, avoid simple carbs. Increase exercise, add Welchol

## 2013-05-06 MED ORDER — COLESEVELAM HCL 3.75 G PO PACK: 1.0000 | PACK | Freq: Every day | ORAL | Status: DC

## 2013-05-06 NOTE — Addendum Note (Signed)
Addended by: Court Joy on: 05/06/2013 09:20 AM   Modules accepted: Orders

## 2013-05-06 NOTE — Progress Notes (Signed)
Quick Note:  Patient Informed and voiced understanding.  RX sent to pharmacy ______ 

## 2013-05-08 ENCOUNTER — Other Ambulatory Visit: Payer: Self-pay | Admitting: Family Medicine

## 2013-05-08 NOTE — Telephone Encounter (Signed)
Please advise Tramadol refill? Last RX was done on 10-09-12 quantity 60 with 1 refill.  If ok fax to [phone]

## 2013-05-08 NOTE — Telephone Encounter (Signed)
OK to send 60 tabs with zero refills.  

## 2013-05-09 NOTE — Telephone Encounter (Signed)
Rx called to [place] at [facility] as below.

## 2013-06-28 ENCOUNTER — Other Ambulatory Visit: Payer: Self-pay | Admitting: Family Medicine

## 2013-07-02 ENCOUNTER — Other Ambulatory Visit: Payer: Self-pay | Admitting: Internal Medicine

## 2013-08-06 ENCOUNTER — Other Ambulatory Visit: Payer: Self-pay | Admitting: Family Medicine

## 2013-08-22 ENCOUNTER — Ambulatory Visit (INDEPENDENT_AMBULATORY_CARE_PROVIDER_SITE_OTHER): Payer: 59 | Admitting: Family Medicine

## 2013-08-22 ENCOUNTER — Encounter: Payer: Self-pay | Admitting: Family Medicine

## 2013-08-22 VITALS — BP 108/72 | HR 73 | Temp 98.5°F | Ht 64.0 in | Wt 253.0 lb

## 2013-08-22 DIAGNOSIS — E119 Type 2 diabetes mellitus without complications: Secondary | ICD-10-CM

## 2013-08-22 DIAGNOSIS — K219 Gastro-esophageal reflux disease without esophagitis: Secondary | ICD-10-CM

## 2013-08-22 DIAGNOSIS — I10 Essential (primary) hypertension: Secondary | ICD-10-CM

## 2013-08-22 DIAGNOSIS — R079 Chest pain, unspecified: Secondary | ICD-10-CM

## 2013-08-22 DIAGNOSIS — R0789 Other chest pain: Secondary | ICD-10-CM

## 2013-08-22 MED ORDER — CARVEDILOL 3.125 MG PO TABS: 3.1250 mg | ORAL_TABLET | Freq: Two times a day (BID) | ORAL | Status: DC

## 2013-08-22 MED ORDER — NITROGLYCERIN 0.4 MG SL SUBL: 0.4000 mg | SUBLINGUAL_TABLET | SUBLINGUAL | Status: DC | PRN

## 2013-08-22 NOTE — Patient Instructions (Addendum)
Try [facility] or Aspercreme to chest wall as needed for pain     Basic Carbohydrate Counting Basic carbohydrate counting is a way to plan meals. It is done by counting the amount of carbohydrate in foods. Foods that have carbohydrates are starches (grains, beans, starchy vegetables) and sweets. Eating carbohydrates increases blood glucose (sugar) levels. People with diabetes use carbohydrate counting to help keep their blood glucose at a normal level.  COUNTING CARBOHYDRATES IN FOODS The first step in counting carbohydrates is to learn how many carbohydrate servings you should have in every meal. A dietitian can plan this for you. After learning the amount of carbohydrates to include in your meal plan, you can start to choose the carbohydrate-containing foods you want to eat.  There are 2 ways to identify the amount of carbohydrates in the foods you eat.  Read the Nutrition Facts panel on food labels. You need 2 pieces of information from the Nutrition Facts panel to count carbohydrates this way:  Serving size.  Total carbohydrate (in grams). Decide how many servings you will be eating. If it is 1 serving, you will be eating the amount of carbohydrate listed on the panel. If you will be eating 2 servings, you will be eating double the amount of carbohydrate listed on the panel.   Learn serving sizes. A serving size of most carbohydrate-containing foods is about 15 grams (g). Listed below are single serving sizes of common carbohydrate-containing foods:  1 slice bread.   cup unsweetened, dry cereal.   cup hot cereal.   cup rice.   cup mashed potatoes.   cup pasta.  1 cup fresh fruit.   cup canned fruit.  1 cup milk (whole, 2%, or skim).   cup starchy vegetables (peas, corn, or potatoes). Counting carbohydrates this way is similar to looking on the Nutrition Facts panel. Decide how many servings you will eat first. Multiply the number of servings you eat by 15 g. For example,  if you have 2 cups of strawberries, you had 2 servings. That means you had 30 g of carbohydrate (2 servings x 15 g = 30 g). CALCULATING CARBOHYDRATES IN A MEAL Sample dinner  3 oz chicken breast.   cup brown rice.   cup corn.  1 cup fat-free milk.  1 cup strawberries with sugar-free whipped topping. Carbohydrate calculation First, identify the foods that contain carbohydrate:  Rice.  Corn.  Milk.  Strawberries. Calculate the number of servings eaten:  2 servings rice.  1 serving corn.  1 serving milk.  1 serving strawberries. Multiply the number of servings by 15 g:  2 servings rice x 15 g = 30 g.  1 serving corn x 15 g = 15 g.  1 serving milk x 15 g = 15 g.  1 serving strawberries x 15 g = 15 g. Add the amounts to find the total carbohydrates eaten: 30 g + 15 g + 15 g + 15 g = 75 g carbohydrate eaten at dinner. Document Released: 09/05/2005 Document Revised: 11/28/2011 Document Reviewed: 07/22/2011 [facility] Patient Information 2014 [place], [place].

## 2013-08-22 NOTE — Progress Notes (Signed)
Pre visit review using our clinic review tool, if applicable. No additional management support is needed unless otherwise documented below in the visit note. 

## 2013-08-24 ENCOUNTER — Encounter: Payer: Self-pay | Admitting: Family Medicine

## 2013-08-24 DIAGNOSIS — R0789 Other chest pain: Secondary | ICD-10-CM | POA: Insufficient documentation

## 2013-08-24 HISTORY — DX: Other chest pain: R07.89

## 2013-08-24 NOTE — Assessment & Plan Note (Addendum)
Well controlled today but Bystolic is cost prohibitive will switch to Carvedilol

## 2013-08-24 NOTE — Assessment & Plan Note (Signed)
Does not sound clasically cardiac but with her risk factors will refer to cardiology for evaluation. She is instructed to start an 81 mg aspirin and given a prescription for NTG to use prn. Seek care if chest pain occurs and does not resolve

## 2013-08-24 NOTE — Assessment & Plan Note (Signed)
Declines labs today but agrees to return for labs in 1-2 months. Minimize simple carbs and continue current meds.

## 2013-08-24 NOTE — Progress Notes (Signed)
Patient ID: Eileen Richards, female   DOB: Feb 12, 1961, 52 y.o.   MRN: [id number] Porchea Charrier [id number] 1960/10/15 08/24/2013      Progress Note-Follow Up  Subjective  Chief Complaint  Chief Complaint  Patient presents with  . Follow-up    4 month  . FMLA    pt needs FMLA paperwork done    HPI  Patient is a 52 year old American female who is in today for followup. She is generally doing well is describing some intermittent left upper anterior chest wall discomfort. She has difficulty describing the quality of the. It is not pressure and it is not sharp it is more of a cramping sensation. There are no other associated symptoms no shortness of breath, diaphoresis, nausea, palpitations or shortness of breath. She denies any pattern as to when to when the pain occurs. No correlation with eating or activity that she can note. She doesn't note it does not wake her up at night. Otherwise she reports feeling well. She does have some trouble with Achilles tendinitis which has recently flared. No GI or GU complaints.  Past Medical History  Diagnosis Date  . Diabetes mellitus   . Hypertension   . Hyperlipemia   . Atypical chest pain     normal stress echo 02/2009 -LV size was normal LV global systolic function was normal- Normal wall motion; no LV regional wall motion abnormalities  . Asthma     childhood  . GERD (gastroesophageal reflux disease)   . Pain of right heel 10/14/2012  . Achilles tendonitis 10/14/2012  . Chest pain, atypical 08/24/2013    Past Surgical History  Procedure Laterality Date  . Abdominal hysterectomy    . Dilation and curettage of uterus      Lysis of Adhesions  . Robotic assisted salpingo oopherectomy  07/23/2012    Procedure: ROBOTIC ASSISTED SALPINGO OOPHERECTOMY;  Surgeon: Serita Kyle, MD;  Location: [facility];  Service: Gynecology;  Laterality: Bilateral;  . Robotic assisted laparoscopic lysis of adhesion  07/23/2012    Procedure: ROBOTIC ASSISTED  LAPAROSCOPIC LYSIS OF ADHESION;  Surgeon: Serita Kyle, MD;  Location: [facility];  Service: Gynecology;  Laterality: N/A;  . Cystoscopy  07/23/2012    Procedure: CYSTOSCOPY;  Surgeon: Serita Kyle, MD;  Location: [facility];  Service: Gynecology;  Laterality: N/A;    Family History  Problem Relation Age of Onset  . GER disease Mother   . GER disease Father   . Heart disease Father     father has "heart problems"    History   Social History  . Marital Status: Single    Spouse Name: N/A    Number of Children: N/A  . Years of Education: N/A   Occupational History  . Not on file.   Social History Main Topics  . Smoking status: Former Games developer  . Smokeless tobacco: Never Used  . Alcohol Use: No  . Drug Use: No  . Sexual Activity: Not on file   Other Topics Concern  . Not on file   Social History Narrative  . No narrative on file    Current Outpatient Prescriptions on File Prior to Visit  Medication Sig Dispense Refill  . albuterol (PROVENTIL HFA;VENTOLIN HFA) 108 (90 BASE) MCG/ACT inhaler Inhale 2 puffs into the lungs every 6 (six) hours as needed for wheezing.  1 Inhaler  2  . Blood Glucose Monitoring Suppl (TRUETRACK BLOOD GLUCOSE) W/DEVICE KIT by Does not apply route.        Marland Kitchen  Colesevelam HCl [facility]) 3.75 G PACK Take 1 packet by mouth daily.  30 each  2  . glimepiride (AMARYL) 4 MG tablet TAKE 1 TABLET BY MOUTH TWICE DAILY  60 tablet  3  . hydrochlorothiazide (HYDRODIURIL) 25 MG tablet TAKE 1 TABLET BY MOUTH DAILY.  90 tablet  1  . JANUVIA 100 MG tablet TAKE 1 TABLET BY MOUTH DAILY.  30 tablet  3  . Lancet Device MISC by Does not apply route. Use to test blood sugar three times daily       . metFORMIN (GLUCOPHAGE-XR) 500 MG 24 hr tablet TAKE 2 TABLETS BY MOUTH TWICE DAILY  120 tablet  11  . Multiple Vitamin (MULTIVITAMIN) tablet Take 1 tablet by mouth daily.        . pantoprazole (PROTONIX) 40 MG tablet TAKE 1 TABLET BY MOUTH DAILY  30 tablet  3  . PARoxetine  Mesylate (BRISDELLE) 7.5 MG CAPS Take 1 capsule by mouth daily.      . simvastatin (ZOCOR) 20 MG tablet TAKE 1 TABLET BY MOUTH AT BEDTIME.  90 tablet  1  . traMADol (ULTRAM) 50 MG tablet TAKE 1 TABLET BY MOUTH 2 TIMES DAILY AS NEEDED FOR PAIN.  60 tablet  0  . TRUETEST TEST test strip USE TO TEST BLOOD SUGAR THREE TIMES A DAY  100 each  3   No current facility-administered medications on file prior to visit.    No Known Allergies  Review of Systems  Review of Systems  Constitutional: Negative for fever and malaise/fatigue.  HENT: Negative for congestion.   Eyes: Negative for discharge.  Respiratory: Negative for shortness of breath.   Cardiovascular: Positive for chest pain. Negative for palpitations and leg swelling.  Gastrointestinal: Negative for nausea, abdominal pain and diarrhea.  Genitourinary: Negative for dysuria.  Musculoskeletal: Negative for falls.  Skin: Negative for rash.  Neurological: Negative for loss of consciousness and headaches.  Endo/Heme/Allergies: Negative for polydipsia.  Psychiatric/Behavioral: Negative for depression and suicidal ideas. The patient is not nervous/anxious and does not have insomnia.     Objective  BP 108/72  Pulse 73  Temp(Src) 98.5 F (36.9 C) (Oral)  Ht 5\' 4"  (1.626 m)  Wt 253 lb 0.6 oz (114.778 kg)  BMI 43.41 kg/m2  SpO2 96%  Physical Exam  Physical Exam  Constitutional: She is oriented to person, place, and time and well-developed, well-nourished, and in no distress. No distress.  HENT:  Head: Normocephalic and atraumatic.  Eyes: Conjunctivae are normal.  Neck: Neck supple. No thyromegaly present.  Cardiovascular: Normal rate, regular rhythm and normal heart sounds.   No murmur heard. Pulmonary/Chest: Effort normal and breath sounds normal. She has no wheezes.  Abdominal: She exhibits no distension and no mass.  Musculoskeletal: She exhibits no edema.  Lymphadenopathy:    She has no cervical adenopathy.  Neurological:  She is alert and oriented to person, place, and time.  Skin: Skin is warm and dry. No rash noted. She is not diaphoretic.  Psychiatric: Memory, affect and judgment normal.    Lab Results  Component Value Date   TSH 1.549 10/09/2012   Lab Results  Component Value Date   WBC 10.5 10/09/2012   HGB 13.8 10/09/2012   HCT 39.7 10/09/2012   MCV 86.9 10/09/2012   PLT 431* 10/09/2012   Lab Results  Component Value Date   CREATININE 0.84 01/24/2013   BUN 13 01/24/2013   NA 139 01/24/2013   K 4.2 01/24/2013   CL 96 01/24/2013  CO2 32 01/24/2013   Lab Results  Component Value Date   ALT 20 10/09/2012   AST 18 10/09/2012   ALKPHOS 67 10/09/2012   BILITOT 0.3 10/09/2012   Lab Results  Component Value Date   CHOL 193 05/02/2013   Lab Results  Component Value Date   HDL 36* 05/02/2013   Lab Results  Component Value Date   LDLCALC 98 05/02/2013   Lab Results  Component Value Date   TRIG 294* 05/02/2013   Lab Results  Component Value Date   CHOLHDL 5.4 05/02/2013     Assessment & Plan  HYPERTENSION Well controlled today but Bystolic is cost prohibitive will switch to Carvedilol  DIABETES MELLITUS, TYPE II Declines labs today but agrees to return for labs in 1-2 months. Minimize simple carbs and continue current meds.  Chest pain, atypical Does not sound clasically cardiac but with her risk factors will refer to cardiology for evaluation. She is instructed to start an 81 mg aspirin and given a prescription for NTG to use prn. Seek care if chest pain occurs and does not resolve  GERD (gastroesophageal reflux disease) Denies significant symptoms recently   Declines pneumonia shot today

## 2013-08-24 NOTE — Assessment & Plan Note (Signed)
Denies significant symptoms recently

## 2013-08-28 ENCOUNTER — Ambulatory Visit (INDEPENDENT_AMBULATORY_CARE_PROVIDER_SITE_OTHER): Payer: 59 | Admitting: Cardiovascular Disease

## 2013-08-28 ENCOUNTER — Encounter: Payer: Self-pay | Admitting: Cardiovascular Disease

## 2013-08-28 VITALS — BP 135/80 | HR 72 | Ht 64.0 in | Wt 251.8 lb

## 2013-08-28 DIAGNOSIS — R079 Chest pain, unspecified: Secondary | ICD-10-CM

## 2013-08-28 DIAGNOSIS — I10 Essential (primary) hypertension: Secondary | ICD-10-CM

## 2013-08-28 DIAGNOSIS — R0789 Other chest pain: Secondary | ICD-10-CM

## 2013-08-28 DIAGNOSIS — E785 Hyperlipidemia, unspecified: Secondary | ICD-10-CM

## 2013-08-28 DIAGNOSIS — E119 Type 2 diabetes mellitus without complications: Secondary | ICD-10-CM

## 2013-08-28 NOTE — Assessment & Plan Note (Signed)
Well controlled.  Continue current medications and low sodium Dash type diet.    

## 2013-08-28 NOTE — Patient Instructions (Signed)
Your physician has requested that you have an exercise tolerance test. For further information please visit [email or web address]. Please also follow instruction sheet, as given.  Your physician recommends that you continue on your current medications as directed. Please refer to the Current Medication list given to you today.  

## 2013-08-28 NOTE — Assessment & Plan Note (Signed)
Cholesterol is at goal.  Continue current dose of statin and diet Rx.  No myalgias or side effects.  F/U  LFT's in 6 months. Lab Results  Component Value Date   LDLCALC 98 05/02/2013

## 2013-08-28 NOTE — Progress Notes (Signed)
Patient ID: Eileen Richards, female   DOB: [birth year], 52 y.o.   MRN: [id number]    Patient is a 52 year old American female referrd by Dr Rogelia Rohrer for chest pain  She is generally doing well is describing some intermittent left upper anterior chest wall discomfort. She has difficulty describing the quality of the. It is not pressure and it is not sharp it is more of a cramping sensation. There are no other associated symptoms no shortness of breath, diaphoresis, nausea, palpitations or shortness of breath. She denies any pattern as to when to when the pain occurs. No correlation with eating or activity that she can note. She doesn't note it does not wake her up at night. CRF elevated lipids, HTN and DM  Pain started a month ago.  Only lasts seconds to minutes. Had some dizzyness last Sunday She is sedentary and has too many carbs in diet  In August LDL was 98 and A1c was 7.3  ROS: Denies fever, malais, weight loss, blurry vision, decreased visual acuity, cough, sputum, SOB, hemoptysis, pleuritic pain, palpitaitons, heartburn, abdominal pain, melena, lower extremity edema, claudication, or rash.  All other systems reviewed and negative   General: Affect appropriate Obese  HEENT: normal Neck supple with no adenopathy JVP normal no bruits no thyromegaly Lungs clear with no wheezing and good diaphragmatic motion Heart:  S1/S2 no murmur,rub, gallop or click PMI normal Abdomen: benighn, BS positve, no tenderness, no AAA no bruit.  No HSM or HJR Distal pulses intact with no bruits No edema Neuro non-focal Skin warm and dry No muscular weakness  Medications Current Outpatient Prescriptions  Medication Sig Dispense Refill  . albuterol (PROVENTIL HFA;VENTOLIN HFA) 108 (90 BASE) MCG/ACT inhaler Inhale 2 puffs into the lungs every 6 (six) hours as needed for wheezing.  1 Inhaler  2  . Blood Glucose Monitoring Suppl (TRUETRACK BLOOD GLUCOSE) W/DEVICE KIT by Does not apply route.        .  carvedilol (COREG) 3.125 MG tablet Take 1 tablet (3.125 mg total) by mouth 2 (two) times daily with a meal.  60 tablet  3  . Colesevelam HCl [facility]) 3.75 G PACK Take 1 packet by mouth daily.  30 each  2  . glimepiride (AMARYL) 4 MG tablet TAKE 1 TABLET BY MOUTH TWICE DAILY  60 tablet  3  . hydrochlorothiazide (HYDRODIURIL) 25 MG tablet TAKE 1 TABLET BY MOUTH DAILY.  90 tablet  1  . JANUVIA 100 MG tablet TAKE 1 TABLET BY MOUTH DAILY.  30 tablet  3  . Lancet Device MISC by Does not apply route. Use to test blood sugar three times daily       . metFORMIN (GLUCOPHAGE-XR) 500 MG 24 hr tablet TAKE 2 TABLETS BY MOUTH TWICE DAILY  120 tablet  11  . Multiple Vitamin (MULTIVITAMIN) tablet Take 1 tablet by mouth daily.        . nitroGLYCERIN (NITROSTAT) 0.4 MG SL tablet Place 1 tablet (0.4 mg total) under the tongue every 5 (five) minutes as needed for chest pain.  25 tablet  3  . pantoprazole (PROTONIX) 40 MG tablet TAKE 1 TABLET BY MOUTH DAILY  30 tablet  3  . PARoxetine Mesylate (BRISDELLE) 7.5 MG CAPS Take 1 capsule by mouth daily.      . simvastatin (ZOCOR) 20 MG tablet TAKE 1 TABLET BY MOUTH AT BEDTIME.  90 tablet  1  . traMADol (ULTRAM) 50 MG tablet TAKE 1 TABLET BY MOUTH 2 TIMES DAILY AS NEEDED FOR  PAIN.  60 tablet  0  . TRUETEST TEST test strip USE TO TEST BLOOD SUGAR THREE TIMES A DAY  100 each  3   No current facility-administered medications for this visit.    Allergies Review of patient's allergies indicates no known allergies.  Family History: Family History  Problem Relation Age of Onset  . GER disease Mother   . GER disease Father   . Heart disease Father     father has "heart problems"    Social History: History   Social History  . Marital Status: Single    Spouse Name: N/A    Number of Children: N/A  . Years of Education: N/A   Occupational History  . Not on file.   Social History Main Topics  . Smoking status: Former Games developer  . Smokeless tobacco: Never Used  .  Alcohol Use: No  . Drug Use: No  . Sexual Activity: Not on file   Other Topics Concern  . Not on file   Social History Narrative  . No narrative on file    Electrocardiogram:  08/22/13 SR rate 68 normal   Assessment and Plan

## 2013-08-28 NOTE — Assessment & Plan Note (Signed)
Discussed low carb diet.  Target hemoglobin A1c is 6.5 or less.  Continue current medications.  

## 2013-08-28 NOTE — Assessment & Plan Note (Signed)
Atypical but multiple risk factors Normal ECG f/u ETT

## 2013-09-10 ENCOUNTER — Ambulatory Visit (HOSPITAL_COMMUNITY)
Admission: RE | Admit: 2013-09-10 | Discharge: 2013-09-10 | Disposition: A | Discharge status: Home / Self Care | Payer: 59 | Source: Ambulatory Visit | Attending: Cardiovascular Disease | Admitting: Cardiovascular Disease

## 2013-09-10 DIAGNOSIS — R079 Chest pain, unspecified: Secondary | ICD-10-CM | POA: Insufficient documentation

## 2013-09-16 ENCOUNTER — Telehealth: Payer: Self-pay | Admitting: *Deleted

## 2013-09-16 NOTE — Telephone Encounter (Signed)
LEFT  VOICE  MAIL  GTT  WAS NORMAL  PER DR NISHAN./CY

## 2013-09-27 ENCOUNTER — Other Ambulatory Visit: Payer: Self-pay | Admitting: Family Medicine

## 2013-10-15 ENCOUNTER — Telehealth: Payer: Self-pay | Admitting: Family Medicine

## 2013-10-15 DIAGNOSIS — I10 Essential (primary) hypertension: Secondary | ICD-10-CM

## 2013-10-15 DIAGNOSIS — E119 Type 2 diabetes mellitus without complications: Secondary | ICD-10-CM

## 2013-10-15 DIAGNOSIS — E785 Hyperlipidemia, unspecified: Secondary | ICD-10-CM

## 2013-10-15 NOTE — Telephone Encounter (Signed)
Lab order [facility] week of 10-31-2013 Labs at or prior to next visit, lipid, renal, cbc, tsh, hepatic, hgba1c

## 2013-10-21 ENCOUNTER — Telehealth: Payer: Self-pay

## 2013-10-21 DIAGNOSIS — E119 Type 2 diabetes mellitus without complications: Secondary | ICD-10-CM

## 2013-10-21 DIAGNOSIS — E785 Hyperlipidemia, unspecified: Secondary | ICD-10-CM

## 2013-10-21 DIAGNOSIS — I10 Essential (primary) hypertension: Secondary | ICD-10-CM

## 2013-10-21 NOTE — Telephone Encounter (Signed)
Lab order placed for [facility]

## 2013-10-21 NOTE — Telephone Encounter (Signed)
Lab order reordered for [facility]

## 2013-10-31 ENCOUNTER — Other Ambulatory Visit: Payer: Self-pay | Admitting: Family Medicine

## 2013-11-08 ENCOUNTER — Other Ambulatory Visit (INDEPENDENT_AMBULATORY_CARE_PROVIDER_SITE_OTHER): Payer: 59

## 2013-11-08 DIAGNOSIS — E119 Type 2 diabetes mellitus without complications: Secondary | ICD-10-CM

## 2013-11-08 DIAGNOSIS — E785 Hyperlipidemia, unspecified: Secondary | ICD-10-CM

## 2013-11-08 DIAGNOSIS — I10 Essential (primary) hypertension: Secondary | ICD-10-CM

## 2013-11-08 LAB — RENAL FUNCTION PANEL
Albumin: 3.7 g/dL (ref 3.5–5.2)
BUN: 14 mg/dL (ref 6–23)
CO2: 31 mEq/L (ref 19–32)
Calcium: 9.3 mg/dL (ref 8.4–10.5)
Chloride: 103 mEq/L (ref 96–112)
Creatinine, Ser: 0.8 mg/dL (ref 0.4–1.2)
GFR: 101.63 mL/min (ref >60.00)
Glucose, Bld: 173 mg/dL — ABNORMAL HIGH (ref 70–99)
Phosphorus: 3.5 mg/dL (ref 2.3–4.6)
Potassium: 4.2 mEq/L (ref 3.5–5.1)
Sodium: 140 mEq/L (ref 135–145)

## 2013-11-08 LAB — HEPATIC FUNCTION PANEL
ALT: 15 U/L (ref 0–35)
AST: 15 U/L (ref 0–37)
Albumin: 3.7 g/dL (ref 3.5–5.2)
Alkaline Phosphatase: 78 U/L (ref 39–117)
Bilirubin, Direct: 0 mg/dL (ref 0.0–0.3)
Total Bilirubin: 0.3 mg/dL (ref 0.3–1.2)
Total Protein: 7.2 g/dL (ref 6.0–8.3)

## 2013-11-08 LAB — LIPID PANEL
Cholesterol: 153 mg/dL (ref 0–200)
HDL: 35.4 mg/dL — ABNORMAL LOW (ref >39.00)
LDL Cholesterol: 92 mg/dL (ref 0–99)
Total CHOL/HDL Ratio: 4
Triglycerides: 128 mg/dL (ref 0.0–149.0)
VLDL: 25.6 mg/dL (ref 0.0–40.0)

## 2013-11-08 LAB — CBC
HCT: 41.5 % (ref 36.0–46.0)
Hemoglobin: 13.6 g/dL (ref 12.0–15.0)
MCHC: 32.8 g/dL (ref 30.0–36.0)
MCV: 92.7 fl (ref 78.0–100.0)
Platelets: 374 10*3/uL (ref 150.0–400.0)
RBC: 4.48 Mil/uL (ref 3.87–5.11)
RDW: 13.4 % (ref 11.5–14.6)
WBC: 11.4 10*3/uL — ABNORMAL HIGH (ref 4.5–10.5)

## 2013-11-08 LAB — HEMOGLOBIN A1C: Hgb A1c MFr Bld: 8.5 % — ABNORMAL HIGH (ref 4.6–6.5)

## 2013-11-08 LAB — TSH: TSH: 3.12 u[IU]/mL (ref 0.35–5.50)

## 2013-11-12 ENCOUNTER — Ambulatory Visit: Payer: 59 | Admitting: Family Medicine

## 2013-12-03 ENCOUNTER — Encounter: Payer: Self-pay | Admitting: Family Medicine

## 2013-12-03 ENCOUNTER — Ambulatory Visit (INDEPENDENT_AMBULATORY_CARE_PROVIDER_SITE_OTHER): Payer: 59 | Admitting: Family Medicine

## 2013-12-03 VITALS — BP 102/82 | HR 102 | Temp 98.3°F | Ht 64.0 in | Wt 253.0 lb

## 2013-12-03 DIAGNOSIS — I10 Essential (primary) hypertension: Secondary | ICD-10-CM

## 2013-12-03 DIAGNOSIS — E785 Hyperlipidemia, unspecified: Secondary | ICD-10-CM

## 2013-12-03 DIAGNOSIS — E119 Type 2 diabetes mellitus without complications: Secondary | ICD-10-CM

## 2013-12-03 DIAGNOSIS — K219 Gastro-esophageal reflux disease without esophagitis: Secondary | ICD-10-CM

## 2013-12-03 MED ORDER — METFORMIN HCL ER 500 MG PO TB24: ORAL_TABLET | ORAL | Status: DC

## 2013-12-03 NOTE — Progress Notes (Signed)
Pre visit review using our clinic review tool, if applicable. No additional management support is needed unless otherwise documented below in the visit note. 

## 2013-12-08 ENCOUNTER — Encounter: Payer: Self-pay | Admitting: Family Medicine

## 2013-12-08 NOTE — Assessment & Plan Note (Signed)
Well controlled, no changes to meds. Encouraged heart healthy diet such as the DASH diet and exercise as tolerated.  °

## 2013-12-08 NOTE — Progress Notes (Signed)
Patient ID: Eileen Richards, female   DOB: [birth year], 53 y.o.   MRN: [id number] Clair Bardwell [id number] Sep 05, 1961 12/08/2013      Progress Note-Follow Up  Subjective  Chief Complaint  Chief Complaint  Patient presents with  . Follow-up    HPI  Patient is a 53 year old female in today for routine medical care. She is noting improvement in her blood sugars since her last visit with medication alterations in diet changes. Does acknowledge though that her numbers still remain between 150 and 200. No significant polyuria or polydipsia. No recent illness. Denies CP/palp/SOB/HA/congestion/fevers/GI or GU c/o. Taking meds as prescribed  Past Medical History  Diagnosis Date  . Diabetes mellitus   . Hypertension   . Hyperlipemia   . Atypical chest pain     normal stress echo 02/2009 -LV size was normal LV global systolic function was normal- Normal wall motion; no LV regional wall motion abnormalities  . Asthma     childhood  . GERD (gastroesophageal reflux disease)   . Pain of right heel 10/14/2012  . Achilles tendonitis 10/14/2012  . Chest pain, atypical 08/24/2013    Past Surgical History  Procedure Laterality Date  . Abdominal hysterectomy    . Dilation and curettage of uterus      Lysis of Adhesions  . Robotic assisted salpingo oopherectomy  07/23/2012    Procedure: ROBOTIC ASSISTED SALPINGO OOPHERECTOMY;  Surgeon: Marvene Staff, MD;  Location: [facility] [facility];  Service: Gynecology;  Laterality: Bilateral;  . Robotic assisted laparoscopic lysis of adhesion  07/23/2012    Procedure: ROBOTIC ASSISTED LAPAROSCOPIC LYSIS OF ADHESION;  Surgeon: Marvene Staff, MD;  Location: [place] [facility];  Service: Gynecology;  Laterality: N/A;  . Cystoscopy  07/23/2012    Procedure: CYSTOSCOPY;  Surgeon: Marvene Staff, MD;  Location: [place] [facility];  Service: Gynecology;  Laterality: N/A;    Family History  Problem Relation Age of Onset  . GER disease Mother   . GER disease Father   . Heart disease  Father     father has "heart problems"    History   Social History  . Marital Status: Single    Spouse Name: N/A    Number of Children: N/A  . Years of Education: N/A   Occupational History  . Not on file.   Social History Main Topics  . Smoking status: Former Research scientist (life sciences)  . Smokeless tobacco: Never Used  . Alcohol Use: No  . Drug Use: No  . Sexual Activity: Not on file   Other Topics Concern  . Not on file   Social History Narrative  . No narrative on file    Current Outpatient Prescriptions on File Prior to Visit  Medication Sig Dispense Refill  . albuterol (PROVENTIL HFA;VENTOLIN HFA) 108 (90 BASE) MCG/ACT inhaler Inhale 2 puffs into the lungs every 6 (six) hours as needed for wheezing.  1 Inhaler  2  . aspirin 81 MG tablet Take 81 mg by mouth daily.      . Blood Glucose Monitoring Suppl (TRUETRACK BLOOD GLUCOSE) W/DEVICE KIT by Does not apply route.        . carvedilol (COREG) 3.125 MG tablet Take 1 tablet (3.125 mg total) by mouth 2 (two) times daily with a meal.  60 tablet  3  . Colesevelam HCl [facility]) 3.75 G PACK Take 1 packet by mouth daily.  30 each  2  . glimepiride (AMARYL) 4 MG tablet TAKE 1 TABLET BY MOUTH TWICE DAILY  60 tablet  3  . hydrochlorothiazide (HYDRODIURIL) 25 MG tablet TAKE 1 TABLET BY MOUTH DAILY.  90 tablet  3  . JANUVIA 100 MG tablet TAKE 1 TABLET BY MOUTH DAILY.  30 tablet  3  . Lancet Device MISC by Does not apply route. Use to test blood sugar three times daily       . Multiple Vitamin (MULTIVITAMIN) tablet Take 1 tablet by mouth daily.        . nitroGLYCERIN (NITROSTAT) 0.4 MG SL tablet Place 1 tablet (0.4 mg total) under the tongue every 5 (five) minutes as needed for chest pain.  25 tablet  3  . pantoprazole (PROTONIX) 40 MG tablet TAKE 1 TABLET BY MOUTH DAILY  30 tablet  3  . simvastatin (ZOCOR) 20 MG tablet TAKE 1 TABLET BY MOUTH AT BEDTIME.  90 tablet  1  . traMADol (ULTRAM) 50 MG tablet TAKE 1 TABLET BY MOUTH 2 TIMES DAILY AS NEEDED FOR  PAIN.  60 tablet  0  . TRUETEST TEST test strip USE TO TEST BLOOD SUGAR THREE TIMES A DAY  100 each  3   No current facility-administered medications on file prior to visit.    No Known Allergies  Review of Systems  Review of Systems  Constitutional: Positive for malaise/fatigue. Negative for fever.  HENT: Negative for congestion.   Eyes: Negative for discharge.  Respiratory: Negative for shortness of breath.   Cardiovascular: Negative for chest pain, palpitations and leg swelling.  Gastrointestinal: Negative for nausea, abdominal pain and diarrhea.  Genitourinary: Negative for dysuria.  Musculoskeletal: Positive for myalgias. Negative for falls.  Skin: Negative for rash.  Neurological: Negative for loss of consciousness and headaches.  Endo/Heme/Allergies: Negative for polydipsia.  Psychiatric/Behavioral: Negative for depression and suicidal ideas. The patient is not nervous/anxious and does not have insomnia.     Objective  BP 102/82  Pulse 102  Temp(Src) 98.3 F (36.8 C) (Oral)  Ht $R'5\' 4"'tr$  (1.626 m)  Wt 253 lb (114.76 kg)  BMI 43.41 kg/m2  SpO2 97%  Physical Exam  Physical Exam  Constitutional: She is oriented to person, place, and time and well-developed, well-nourished, and in no distress. No distress.  HENT:  Head: Normocephalic and atraumatic.  Eyes: Conjunctivae are normal.  Neck: Neck supple. No thyromegaly present.  Cardiovascular: Normal rate, regular rhythm and normal heart sounds.   No murmur heard. Pulmonary/Chest: Effort normal and breath sounds normal. She has no wheezes.  Abdominal: She exhibits no distension and no mass.  Musculoskeletal: She exhibits no edema.  Lymphadenopathy:    She has no cervical adenopathy.  Neurological: She is alert and oriented to person, place, and time.  Skin: Skin is warm and dry. No rash noted. She is not diaphoretic.  Psychiatric: Memory, affect and judgment normal.    Lab Results  Component Value Date   TSH 3.12  11/08/2013   Lab Results  Component Value Date   WBC 11.4* 11/08/2013   HGB 13.6 11/08/2013   HCT 41.5 11/08/2013   MCV 92.7 11/08/2013   PLT 374.0 11/08/2013   Lab Results  Component Value Date   CREATININE 0.8 11/08/2013   BUN 14 11/08/2013   NA 140 11/08/2013   K 4.2 11/08/2013   CL 103 11/08/2013   CO2 31 11/08/2013   Lab Results  Component Value Date   ALT 15 11/08/2013   AST 15 11/08/2013   ALKPHOS 78 11/08/2013   BILITOT 0.3 11/08/2013   Lab Results  Component Value Date  CHOL 153 11/08/2013   Lab Results  Component Value Date   HDL 35.40* 11/08/2013   Lab Results  Component Value Date   LDLCALC 92 11/08/2013   Lab Results  Component Value Date   TRIG 128.0 11/08/2013   Lab Results  Component Value Date   CHOLHDL 4 11/08/2013     Assessment & Plan  HYPERTENSION Well controlled, no changes to meds. Encouraged heart healthy diet such as the DASH diet and exercise as tolerated.   DIABETES MELLITUS, TYPE II hgba1c  Not acceptable, minimize simple carbs. Increase exercise as tolerated. Improved numbers since last medication changes. Will proceed with max dose of Metformin and Januvia  GERD (gastroesophageal reflux disease) Avoid offending foods, start probiotics. Do not eat large meals in late evening and consider raising head of bed. Cont Pantoprozole   HYPERLIPIDEMIA Tolerating statin, encouraged heart healthy diet, avoid trans fats, minimize simple carbs and saturated fats. Increase exercise as tolerated

## 2013-12-08 NOTE — Assessment & Plan Note (Signed)
Tolerating statin, encouraged heart healthy diet, avoid trans fats, minimize simple carbs and saturated fats. Increase exercise as tolerated 

## 2013-12-08 NOTE — Assessment & Plan Note (Signed)
Avoid offending foods, start probiotics. Do not eat large meals in late evening and consider raising head of bed. Cont Pantoprozole

## 2013-12-08 NOTE — Assessment & Plan Note (Signed)
hgba1c  Not acceptable, minimize simple carbs. Increase exercise as tolerated. Improved numbers since last medication changes. Will proceed with max dose of Metformin and Januvia

## 2013-12-09 ENCOUNTER — Other Ambulatory Visit: Payer: Self-pay | Admitting: Family Medicine

## 2013-12-12 ENCOUNTER — Telehealth: Payer: Self-pay

## 2013-12-12 NOTE — Telephone Encounter (Signed)
Relevant patient education assigned to patient using Emmi. ° °

## 2014-01-20 ENCOUNTER — Other Ambulatory Visit: Payer: Self-pay | Admitting: Family Medicine

## 2014-03-06 ENCOUNTER — Ambulatory Visit (INDEPENDENT_AMBULATORY_CARE_PROVIDER_SITE_OTHER): Payer: 59 | Admitting: Family Medicine

## 2014-03-06 ENCOUNTER — Encounter: Payer: Self-pay | Admitting: Family Medicine

## 2014-03-06 VITALS — BP 118/63 | HR 90 | Temp 98.3°F | Ht 64.0 in | Wt 253.0 lb

## 2014-03-06 DIAGNOSIS — K219 Gastro-esophageal reflux disease without esophagitis: Secondary | ICD-10-CM

## 2014-03-06 DIAGNOSIS — I10 Essential (primary) hypertension: Secondary | ICD-10-CM

## 2014-03-06 DIAGNOSIS — E785 Hyperlipidemia, unspecified: Secondary | ICD-10-CM

## 2014-03-06 DIAGNOSIS — E669 Obesity, unspecified: Secondary | ICD-10-CM

## 2014-03-06 DIAGNOSIS — E119 Type 2 diabetes mellitus without complications: Secondary | ICD-10-CM

## 2014-03-06 NOTE — Progress Notes (Signed)
Pre visit review using our clinic review tool, if applicable. No additional management support is needed unless otherwise documented below in the visit note. 

## 2014-03-06 NOTE — Patient Instructions (Addendum)
Salon Pas patches or gel/Aspercreme twice a day  Costochondritis Costochondritis, sometimes called Tietze syndrome, is a swelling and irritation (inflammation) of the tissue (cartilage) that connects your ribs with your breastbone (sternum). It causes pain in the chest and rib area. Costochondritis usually goes away on its own over time. It can take up to 6 weeks or longer to get better, especially if you are unable to limit your activities. CAUSES  Some cases of costochondritis have no known cause. Possible causes include:  Injury (trauma).  Exercise or activity such as lifting.  Severe coughing. SIGNS AND SYMPTOMS  Pain and tenderness in the chest and rib area.  Pain that gets worse when coughing or taking deep breaths.  Pain that gets worse with specific movements. DIAGNOSIS  Your health care provider will do a physical exam and ask about your symptoms. Chest X-rays or other tests may be done to rule out other problems. TREATMENT  Costochondritis usually goes away on its own over time. Your health care provider may prescribe medicine to help relieve pain. HOME CARE INSTRUCTIONS   Avoid exhausting physical activity. Try not to strain your ribs during normal activity. This would include any activities using chest, abdominal, and side muscles, especially if heavy weights are used.  Apply ice to the affected area for the first 2 days after the pain begins.  Put ice in a plastic bag.  Place a towel between your skin and the bag.  Leave the ice on for 20 minutes, 2-3 times a day.  Only take over-the-counter or prescription medicines as directed by your health care provider. SEEK MEDICAL CARE IF:  You have redness or swelling at the rib joints. These are signs of infection.  Your pain does not go away despite rest or medicine. SEEK IMMEDIATE MEDICAL CARE IF:   Your pain increases or you are very uncomfortable.  You have shortness of breath or difficulty breathing.  You  cough up blood.  You have worse chest pains, sweating, or vomiting.  You have a fever or persistent symptoms for more than 2-3 days.  You have a fever and your symptoms suddenly get worse. MAKE SURE YOU:   Understand these instructions.  Will watch your condition.  Will get help right away if you are not doing well or get worse. Document Released: 06/15/2005 Document Revised: 06/26/2013 Document Reviewed: 04/09/2013 [facility] Patient Information 2015 [place], [place]. This information is not intended to replace advice given to you by your health care provider. Make sure you discuss any questions you have with your health care provider. Achilles Tendinitis Achilles tendinitis is inflammation of the tough, cord-like band that attaches the lower muscles of your leg to your heel (Achilles tendon). It is usually caused by overusing the tendon and joint involved.  CAUSES Achilles tendinitis can happen because of: A sudden increase in exercise or activity (such as running). Doing the same exercises or activities (such as jumping) over and over. Not warming up calf muscles before exercising. Exercising in shoes that are worn out or not made for exercise. Having arthritis or a bone growth on the back of the heel bone. This can rub against the tendon and hurt the tendon. SIGNS AND SYMPTOMS The most common symptoms are: Pain in the back of the leg, just above the heel. The pain usually gets worse with exercise and better with rest. Stiffness or soreness in the back of the leg, especially in the morning. Swelling of the skin over the Achilles tendon. Trouble standing  on tiptoe. Sometimes, an Achilles tendon tears (ruptures). Symptoms of an Achilles tendon rupture can include: Sudden, severe pain in the back of the leg. Trouble putting weight on the foot or walking normally. DIAGNOSIS Achilles tendinitis will be diagnosed based on symptoms and a physical examination. An X-ray may be done to check  if another condition is causing your symptoms. An MRI may be ordered if your health care provider suspects you may have completely torn your tendon, which is called an Achilles tendon rupture.  TREATMENT  Achilles tendinitis usually gets better over time. It can take weeks to months to heal completely. Treatment focuses on treating the symptoms and helping the injury heal. HOME CARE INSTRUCTIONS  Rest your Achilles tendon and avoid activities that cause pain. Apply ice to the injured area: Put ice in a plastic bag. Place a towel between your skin and the bag. Leave the ice on for 20 minutes, 2-3 times a day Try to avoid using the tendon (other than gentle range of motion) while the tendon is painful. Do not resume use until instructed by your health care provider. Then begin use gradually. Do not increase use to the point of pain. If pain does develop, decrease use and continue the above measures. Gradually increase activities that do not cause discomfort until you achieve normal use. Do exercises to make your calf muscles stronger and more flexible. Your health care provider or physical therapist can recommend exercises for you to do. Wrap your ankle with an elastic bandage or other wrap. This can help keep your tendon from moving too much. Your health care provider will show you how to wrap your ankle correctly. Only take over-the-counter or prescription medicines for pain, discomfort, or fever as directed by your health care provider. SEEK MEDICAL CARE IF:  Your pain and swelling increase or pain is uncontrolled with medicines. You develop new, unexplained symptoms or your symptoms get worse. You are unable to move your toes or foot. You develop warmth and swelling in your foot. You have an unexplained temperature. MAKE SURE YOU:  Understand these instructions. Will watch your condition. Will get help right away if you are not doing well or get worse. Document Released: 06/15/2005 Document  Revised: 06/26/2013 Document Reviewed: 04/17/2013 [facility] Patient Information 2015 [place], [place]. This information is not intended to replace advice given to you by your health care provider. Make sure you discuss any questions you have with your health care provider.  Stretch, ice, aspercreme, Dr Felicie Morn orthotics

## 2014-03-11 ENCOUNTER — Other Ambulatory Visit: Payer: Self-pay | Admitting: Family Medicine

## 2014-03-16 ENCOUNTER — Encounter: Payer: Self-pay | Admitting: Family Medicine

## 2014-03-16 NOTE — Progress Notes (Signed)
Patient ID: Eileen Richards, female   DOB: 07/03/1961, 54 y.o.   MRN: [id number] Eileen Richards [id number] [birth year] 03/16/2014      Progress Note-Follow Up  Subjective  Chief Complaint  Chief Complaint  Patient presents with  . Follow-up    3 month    HPI  Patient is a 53 year old female in today for routine medical care. Patient is in today for followup. Is noting last hemoglobin A1c was 7.9. She has had some sugars over 300 is always when she is eating badly and often when she forgot medications which she has done. Polyuria polydipsia are present in her numbers are high. Also notes some fatigue. No other recent illness.Denies CP/palp/SOB/HA/congestion/fevers/GI or GU c/o. Taking meds as prescribed  Past Medical History  Diagnosis Date  . Diabetes mellitus   . Hypertension   . Hyperlipemia   . Atypical chest pain     normal stress echo 02/2009 -LV size was normal LV global systolic function was normal- Normal wall motion; no LV regional wall motion abnormalities  . Asthma     childhood  . GERD (gastroesophageal reflux disease)   . Pain of right heel 10/14/2012  . Achilles tendonitis 10/14/2012  . Chest pain, atypical 08/24/2013    Past Surgical History  Procedure Laterality Date  . Abdominal hysterectomy    . Dilation and curettage of uterus      Lysis of Adhesions  . Robotic assisted salpingo oopherectomy  07/23/2012    Procedure: ROBOTIC ASSISTED SALPINGO OOPHERECTOMY;  Surgeon: Serita Kyle, MD;  Location: [facility];  Service: Gynecology;  Laterality: Bilateral;  . Robotic assisted laparoscopic lysis of adhesion  07/23/2012    Procedure: ROBOTIC ASSISTED LAPAROSCOPIC LYSIS OF ADHESION;  Surgeon: Serita Kyle, MD;  Location: [facility];  Service: Gynecology;  Laterality: N/A;  . Cystoscopy  07/23/2012    Procedure: CYSTOSCOPY;  Surgeon: Serita Kyle, MD;  Location: [facility];  Service: Gynecology;  Laterality: N/A;    Family History  Problem Relation Age of  Onset  . GER disease Mother   . GER disease Father   . Heart disease Father     father has "heart problems"    History   Social History  . Marital Status: Single    Spouse Name: N/A    Number of Children: N/A  . Years of Education: N/A   Occupational History  . Not on file.   Social History Main Topics  . Smoking status: Former Games developer  . Smokeless tobacco: Never Used  . Alcohol Use: No  . Drug Use: No  . Sexual Activity: Not on file   Other Topics Concern  . Not on file   Social History Narrative  . No narrative on file    Current Outpatient Prescriptions on File Prior to Visit  Medication Sig Dispense Refill  . albuterol (PROVENTIL HFA;VENTOLIN HFA) 108 (90 BASE) MCG/ACT inhaler Inhale 2 puffs into the lungs every 6 (six) hours as needed for wheezing.  1 Inhaler  2  . aspirin 81 MG tablet Take 81 mg by mouth daily.      . Blood Glucose Monitoring Suppl (TRUETRACK BLOOD GLUCOSE) W/DEVICE KIT by Does not apply route.        . carvedilol (COREG) 3.125 MG tablet TAKE 1 TABLET BY MOUTH 2 TIMES DAILY WITH A MEAL  60 tablet  3  . Colesevelam HCl [facility]) 3.75 G PACK Take 1 packet by mouth daily.  30 each  2  .  glimepiride (AMARYL) 4 MG tablet TAKE 1 TABLET BY MOUTH TWICE DAILY  60 tablet  3  . hydrochlorothiazide (HYDRODIURIL) 25 MG tablet TAKE 1 TABLET BY MOUTH DAILY.  90 tablet  3  . Lancet Device MISC by Does not apply route. Use to test blood sugar three times daily       . metFORMIN (GLUCOPHAGE-XR) 500 MG 24 hr tablet 2 tabs po bid and 1 tab po q noon  150 tablet  3  . Multiple Vitamin (MULTIVITAMIN) tablet Take 1 tablet by mouth daily.        . nitroGLYCERIN (NITROSTAT) 0.4 MG SL tablet Place 1 tablet (0.4 mg total) under the tongue every 5 (five) minutes as needed for chest pain.  25 tablet  3  . pantoprazole (PROTONIX) 40 MG tablet TAKE 1 TABLET BY MOUTH DAILY  30 tablet  3  . simvastatin (ZOCOR) 20 MG tablet TAKE 1 TABLET BY MOUTH AT BEDTIME.  90 tablet  1  .  traMADol (ULTRAM) 50 MG tablet TAKE 1 TABLET BY MOUTH 2 TIMES DAILY AS NEEDED FOR PAIN.  60 tablet  0  . TRUETEST TEST test strip USE TO TEST BLOOD SUGAR THREE TIMES A DAY  100 each  3   No current facility-administered medications on file prior to visit.    No Known Allergies  Review of Systems  Review of Systems  Constitutional: Positive for malaise/fatigue. Negative for fever.  HENT: Negative for congestion.   Eyes: Negative for discharge.  Respiratory: Negative for shortness of breath.   Cardiovascular: Negative for chest pain, palpitations and leg swelling.  Gastrointestinal: Negative for nausea, abdominal pain and diarrhea.  Genitourinary: Negative for dysuria.  Musculoskeletal: Negative for falls.  Skin: Negative for rash.  Neurological: Negative for loss of consciousness and headaches.  Endo/Heme/Allergies: Negative for polydipsia.  Psychiatric/Behavioral: Negative for depression and suicidal ideas. The patient is not nervous/anxious and does not have insomnia.     Objective  BP 118/63  Pulse 90  Temp(Src) 98.3 F (36.8 C) (Oral)  Ht $R'5\' 4"'wJ$  (1.626 m)  Wt 253 lb (114.76 kg)  BMI 43.41 kg/m2  SpO2 95%  Physical Exam  Physical Exam  Constitutional: She is oriented to person, place, and time and well-developed, well-nourished, and in no distress. No distress.  HENT:  Head: Normocephalic and atraumatic.  Eyes: Conjunctivae are normal.  Neck: Neck supple. No thyromegaly present.  Cardiovascular: Normal rate, regular rhythm and normal heart sounds.   No murmur heard. Pulmonary/Chest: Effort normal and breath sounds normal. She has no wheezes.  Abdominal: She exhibits no distension and no mass.  Musculoskeletal: She exhibits no edema.  Lymphadenopathy:    She has no cervical adenopathy.  Neurological: She is alert and oriented to person, place, and time.  Skin: Skin is warm and dry. No rash noted. She is not diaphoretic.  Psychiatric: Memory, affect and judgment  normal.    Lab Results  Component Value Date   TSH 3.12 11/08/2013   Lab Results  Component Value Date   WBC 11.4* 11/08/2013   HGB 13.6 11/08/2013   HCT 41.5 11/08/2013   MCV 92.7 11/08/2013   PLT 374.0 11/08/2013   Lab Results  Component Value Date   CREATININE 0.8 11/08/2013   BUN 14 11/08/2013   NA 140 11/08/2013   K 4.2 11/08/2013   CL 103 11/08/2013   CO2 31 11/08/2013   Lab Results  Component Value Date   ALT 15 11/08/2013   AST 15 11/08/2013  ALKPHOS 78 11/08/2013   BILITOT 0.3 11/08/2013   Lab Results  Component Value Date   CHOL 153 11/08/2013   Lab Results  Component Value Date   HDL 35.40* 11/08/2013   Lab Results  Component Value Date   LDLCALC 92 11/08/2013   Lab Results  Component Value Date   TRIG 128.0 11/08/2013   Lab Results  Component Value Date   CHOLHDL 4 11/08/2013     Assessment & Plan  HYPERTENSION Well controlled, no changes to meds. Encouraged heart healthy diet such as the DASH diet and exercise as tolerated.   OBESITY, NOS Encouraged DASH diet, decrease po intake and increase exercise as tolerated. Needs 7-8 hours of sleep nightly. Avoid trans fats, eat small, frequent meals every 4-5 hours with lean proteins, complex carbs and healthy fats. Minimize simple carbs, GMO foods.  HYPERLIPIDEMIA Tolerating statin, encouraged heart healthy diet, avoid trans fats, minimize simple carbs and saturated fats. Increase exercise as tolerated   GERD (gastroesophageal reflux disease) Avoid offending foods, start probiotics. Do not eat large meals in late evening and consider raising head of bed.   DIABETES MELLITUS, TYPE II Patient reports recent A1C was 7.9 and she acknowledges missing doses of meds. Agrees to decrease carbs ad take meds

## 2014-03-16 NOTE — Assessment & Plan Note (Signed)
Well controlled, no changes to meds. Encouraged heart healthy diet such as the DASH diet and exercise as tolerated.  °

## 2014-03-16 NOTE — Assessment & Plan Note (Signed)
Avoid offending foods, start probiotics. Do not eat large meals in late evening and consider raising head of bed.  

## 2014-03-16 NOTE — Assessment & Plan Note (Signed)
Encouraged DASH diet, decrease po intake and increase exercise as tolerated. Needs 7-8 hours of sleep nightly. Avoid trans fats, eat small, frequent meals every 4-5 hours with lean proteins, complex carbs and healthy fats. Minimize simple carbs, GMO foods. 

## 2014-03-16 NOTE — Assessment & Plan Note (Signed)
Tolerating statin, encouraged heart healthy diet, avoid trans fats, minimize simple carbs and saturated fats. Increase exercise as tolerated 

## 2014-03-16 NOTE — Assessment & Plan Note (Signed)
Patient reports recent A1C was 7.9 and she acknowledges missing doses of meds. Agrees to decrease carbs ad take meds

## 2014-04-02 ENCOUNTER — Telehealth: Payer: Self-pay

## 2014-04-02 DIAGNOSIS — E119 Type 2 diabetes mellitus without complications: Secondary | ICD-10-CM

## 2014-04-02 NOTE — Telephone Encounter (Signed)
Patient called back and Anderson Malta informed her

## 2014-04-02 NOTE — Telephone Encounter (Signed)
Diabetic Bundle:  Left a message asking pt to return our call  Pt needs A1C checked  Labs ordered

## 2014-04-05 LAB — HEMOGLOBIN A1C
Hgb A1c MFr Bld: 9.9 % — ABNORMAL HIGH (ref <5.7)
Mean Plasma Glucose: 237 mg/dL — ABNORMAL HIGH (ref <117)

## 2014-04-17 ENCOUNTER — Other Ambulatory Visit: Payer: Self-pay | Admitting: Family Medicine

## 2014-04-17 ENCOUNTER — Ambulatory Visit (INDEPENDENT_AMBULATORY_CARE_PROVIDER_SITE_OTHER): Payer: 59 | Admitting: Family Medicine

## 2014-04-17 ENCOUNTER — Encounter: Payer: Self-pay | Admitting: Family Medicine

## 2014-04-17 VITALS — BP 120/78 | HR 86 | Temp 98.2°F | Ht 64.0 in | Wt 246.0 lb

## 2014-04-17 DIAGNOSIS — E1159 Type 2 diabetes mellitus with other circulatory complications: Secondary | ICD-10-CM

## 2014-04-17 DIAGNOSIS — E785 Hyperlipidemia, unspecified: Secondary | ICD-10-CM

## 2014-04-17 DIAGNOSIS — E669 Obesity, unspecified: Secondary | ICD-10-CM

## 2014-04-17 DIAGNOSIS — B351 Tinea unguium: Secondary | ICD-10-CM | POA: Insufficient documentation

## 2014-04-17 DIAGNOSIS — K219 Gastro-esophageal reflux disease without esophagitis: Secondary | ICD-10-CM

## 2014-04-17 DIAGNOSIS — I10 Essential (primary) hypertension: Secondary | ICD-10-CM

## 2014-04-17 DIAGNOSIS — E119 Type 2 diabetes mellitus without complications: Secondary | ICD-10-CM

## 2014-04-17 DIAGNOSIS — R002 Palpitations: Secondary | ICD-10-CM

## 2014-04-17 HISTORY — DX: Tinea unguium: B35.1

## 2014-04-17 MED ORDER — CANAGLIFLOZIN 100 MG PO TABS: 100.0000 mg | ORAL_TABLET | Freq: Every day | ORAL | Status: DC

## 2014-04-17 MED ORDER — ALPRAZOLAM 0.25 MG PO TABS: 0.2500 mg | ORAL_TABLET | Freq: Two times a day (BID) | ORAL | Status: DC | PRN

## 2014-04-17 NOTE — Assessment & Plan Note (Signed)
Well controlled, no changes to meds. Encouraged heart healthy diet such as the DASH diet and exercise as tolerated.  °

## 2014-04-17 NOTE — Patient Instructions (Signed)
1/2 warm water and vinegar soaks, Vicks vapor rub and lavendar oil   Ringworm, Nail A fungal infection of the nail (tinea unguium/onychomycosis) is common. It is common as the visible part of the nail is composed of dead cells which have no blood supply to help prevent infection. It occurs because fungi are everywhere and will pick any opportunity to grow on any dead material. Because nails are very slow growing they require up to 2 years of treatment with anti-fungal medications. The entire nail back to the base is infected. This includes approximately  of the nail which you cannot see. If your caregiver has prescribed a medication by mouth, take it every day and as directed. No progress will be seen for at least 6 to 9 months. Do not be disappointed! Because fungi live on dead cells with little or no exposure to blood supply, medication delivery to the infection is slow; thus the cure is slow. It is also why you can observe no progress in the first 6 months. The nail becoming cured is the base of the nail, as it has the blood supply. Topical medication such as creams and ointments are usually not effective. Important in successful treatment of nail fungus is closely following the medication regimen that your doctor prescribes. Sometimes you and your caregiver may elect to speed up this process by surgical removal of all the nails. Even this may still require 6 to 9 months of additional oral medications. See your caregiver as directed. Remember there will be no visible improvement for at least 6 months. See your caregiver sooner if other signs of infection (redness and swelling) develop. Document Released: 09/02/2000 Document Revised: 11/28/2011 Document Reviewed: 11/11/2008 [facility] Patient Information 2015 [place], [place]. This information is not intended to replace advice given to you by your health care provider. Make sure you discuss any questions you have with your health care provider.

## 2014-04-21 ENCOUNTER — Encounter: Payer: Self-pay | Admitting: Family Medicine

## 2014-04-21 DIAGNOSIS — R002 Palpitations: Secondary | ICD-10-CM | POA: Insufficient documentation

## 2014-04-21 NOTE — Assessment & Plan Note (Signed)
hgba1c not acceptable, minimize simple carbs. Increase exercise as tolerated. Will add Invokana and encouraged to minimize simple carbs and increase exercise as tolerated

## 2014-04-21 NOTE — Assessment & Plan Note (Signed)
Encouraged soaks in distilled white vinegar and application of Vicks Vapor rub and then referred to podiatry.

## 2014-04-21 NOTE — Assessment & Plan Note (Signed)
Avoid offending foods, start probiotics. Do not eat large meals in late evening and consider raising head of bed.  

## 2014-04-21 NOTE — Assessment & Plan Note (Signed)
Tolerating statin, encouraged heart healthy diet, avoid trans fats, minimize simple carbs and saturated fats. Increase exercise as tolerated 

## 2014-04-21 NOTE — Assessment & Plan Note (Signed)
Patient acknowledges likely anxiety , is encouraged to try a dose of Alprazolam when they occur. If not results let us know. Previous cardiac work up negative

## 2014-04-21 NOTE — Assessment & Plan Note (Signed)
Encouraged DASH diet, decrease po intake and increase exercise as tolerated. Needs 7-8 hours of sleep nightly. Avoid trans fats, eat small, frequent meals every 4-5 hours with lean proteins, complex carbs and healthy fats. Minimize simple carbs, 

## 2014-04-21 NOTE — Progress Notes (Signed)
Chirsty Armistead [id number] 1961/08/16 04/21/2014      Progress Note-Follow Up  Subjective  Chief Complaint  No chief complaint on file.   HPI  Patient is a 53 year old female in today for routine medical care. She is noting her blood sugars are still hi in the 200s most of the time. She had one number as low as 123 but her highest was 240. generraly hang in the low 200s. Denies polyuria or polydipsia. No cp/sob but is noting some intermitent palpitations especially when she is stressed. No recent illness. Is taking medications as prescribed  Past Medical History  Diagnosis Date  . Diabetes mellitus   . Hypertension   . Hyperlipemia   . Atypical chest pain     normal stress echo 02/2009 -LV size was normal LV global systolic function was normal- Normal wall motion; no LV regional wall motion abnormalities  . Asthma     childhood  . GERD (gastroesophageal reflux disease)   . Pain of right heel 10/14/2012  . Achilles tendonitis 10/14/2012  . Chest pain, atypical 08/24/2013  . Onychomycosis 04/17/2014    Past Surgical History  Procedure Laterality Date  . Abdominal hysterectomy    . Dilation and curettage of uterus      Lysis of Adhesions  . Robotic assisted salpingo oopherectomy  07/23/2012    Procedure: ROBOTIC ASSISTED SALPINGO OOPHERECTOMY;  Surgeon: Serita Kyle, MD;  Location: [facility];  Service: Gynecology;  Laterality: Bilateral;  . Robotic assisted laparoscopic lysis of adhesion  07/23/2012    Procedure: ROBOTIC ASSISTED LAPAROSCOPIC LYSIS OF ADHESION;  Surgeon: Serita Kyle, MD;  Location: [facility];  Service: Gynecology;  Laterality: N/A;  . Cystoscopy  07/23/2012    Procedure: CYSTOSCOPY;  Surgeon: Serita Kyle, MD;  Location: [facility];  Service: Gynecology;  Laterality: N/A;    Family History  Problem Relation Age of Onset  . GER disease Mother   . GER disease Father   . Heart disease Father     father has "heart problems"    History   Social  History  . Marital Status: Single    Spouse Name: N/A    Number of Children: N/A  . Years of Education: N/A   Occupational History  . Not on file.   Social History Main Topics  . Smoking status: Former Games developer  . Smokeless tobacco: Never Used  . Alcohol Use: No  . Drug Use: No  . Sexual Activity: Not on file   Other Topics Concern  . Not on file   Social History Narrative  . No narrative on file    Current Outpatient Prescriptions on File Prior to Visit  Medication Sig Dispense Refill  . albuterol (PROVENTIL HFA;VENTOLIN HFA) 108 (90 BASE) MCG/ACT inhaler Inhale 2 puffs into the lungs every 6 (six) hours as needed for wheezing.  1 Inhaler  2  . aspirin 81 MG tablet Take 81 mg by mouth daily.      . Blood Glucose Monitoring Suppl (TRUETRACK BLOOD GLUCOSE) W/DEVICE KIT by Does not apply route.        . carvedilol (COREG) 3.125 MG tablet TAKE 1 TABLET BY MOUTH 2 TIMES DAILY WITH A MEAL  60 tablet  3  . Colesevelam HCl [facility]) 3.75 G PACK Take 1 packet by mouth daily.  30 each  2  . hydrochlorothiazide (HYDRODIURIL) 25 MG tablet TAKE 1 TABLET BY MOUTH DAILY.  90 tablet  3  . JANUVIA 100 MG tablet TAKE  1 TABLET BY MOUTH DAILY.  30 tablet  3  . Lancet Device MISC by Does not apply route. Use to test blood sugar three times daily       . metFORMIN (GLUCOPHAGE-XR) 500 MG 24 hr tablet 2 tabs po bid and 1 tab po q noon  150 tablet  3  . Multiple Vitamin (MULTIVITAMIN) tablet Take 1 tablet by mouth daily.        . nitroGLYCERIN (NITROSTAT) 0.4 MG SL tablet Place 1 tablet (0.4 mg total) under the tongue every 5 (five) minutes as needed for chest pain.  25 tablet  3  . pantoprazole (PROTONIX) 40 MG tablet TAKE 1 TABLET BY MOUTH DAILY  30 tablet  3  . simvastatin (ZOCOR) 20 MG tablet TAKE 1 TABLET BY MOUTH AT BEDTIME.  90 tablet  1  . traMADol (ULTRAM) 50 MG tablet TAKE 1 TABLET BY MOUTH 2 TIMES DAILY AS NEEDED FOR PAIN.  60 tablet  0  . TRUETEST TEST test strip USE TO TEST BLOOD SUGAR  THREE TIMES A DAY  100 each  3   No current facility-administered medications on file prior to visit.    No Known Allergies  Review of Systems  Review of Systems  Constitutional: Negative for fever and malaise/fatigue.  HENT: Negative for congestion.   Eyes: Negative for discharge.  Respiratory: Negative for shortness of breath.   Cardiovascular: Positive for palpitations. Negative for chest pain and leg swelling.  Gastrointestinal: Negative for nausea, abdominal pain and diarrhea.  Genitourinary: Negative for dysuria.  Musculoskeletal: Negative for falls.  Skin: Negative for rash.  Neurological: Negative for loss of consciousness and headaches.  Endo/Heme/Allergies: Negative for polydipsia.  Psychiatric/Behavioral: Negative for depression and suicidal ideas. The patient is not nervous/anxious and does not have insomnia.     Objective  BP 120/78  Pulse 86  Temp(Src) 98.2 F (36.8 C) (Oral)  Ht $R'5\' 4"'ci$  (1.626 m)  Wt 246 lb (111.585 kg)  BMI 42.21 kg/m2  SpO2 95%  Physical Exam  Physical Exam  Constitutional: She is oriented to person, place, and time and well-developed, well-nourished, and in no distress. No distress.  HENT:  Head: Normocephalic and atraumatic.  Eyes: Conjunctivae are normal.  Neck: Neck supple. No thyromegaly present.  Cardiovascular: Normal rate, regular rhythm and normal heart sounds.   No murmur heard. Pulmonary/Chest: Effort normal and breath sounds normal. She has no wheezes.  Abdominal: She exhibits no distension and no mass.  Musculoskeletal: She exhibits no edema.  Lymphadenopathy:    She has no cervical adenopathy.  Neurological: She is alert and oriented to person, place, and time.  Skin: Skin is warm and dry. No rash noted. She is not diaphoretic.  Psychiatric: Memory, affect and judgment normal.    Lab Results  Component Value Date   TSH 3.12 11/08/2013   Lab Results  Component Value Date   WBC 11.4* 11/08/2013   HGB 13.6  11/08/2013   HCT 41.5 11/08/2013   MCV 92.7 11/08/2013   PLT 374.0 11/08/2013   Lab Results  Component Value Date   CREATININE 0.8 11/08/2013   BUN 14 11/08/2013   NA 140 11/08/2013   K 4.2 11/08/2013   CL 103 11/08/2013   CO2 31 11/08/2013   Lab Results  Component Value Date   ALT 15 11/08/2013   AST 15 11/08/2013   ALKPHOS 78 11/08/2013   BILITOT 0.3 11/08/2013   Lab Results  Component Value Date   CHOL 153 11/08/2013  Lab Results  Component Value Date   HDL 35.40* 11/08/2013   Lab Results  Component Value Date   LDLCALC 92 11/08/2013   Lab Results  Component Value Date   TRIG 128.0 11/08/2013   Lab Results  Component Value Date   CHOLHDL 4 11/08/2013     Assessment & Plan  HYPERTENSION Well controlled, no changes to meds. Encouraged heart healthy diet such as the DASH diet and exercise as tolerated.   GERD (gastroesophageal reflux disease) Avoid offending foods, start probiotics. Do not eat large meals in late evening and consider raising head of bed.   DIABETES MELLITUS, TYPE II hgba1c not acceptable, minimize simple carbs. Increase exercise as tolerated. Will add Invokana and encouraged to minimize simple carbs and increase exercise as tolerated  HYPERLIPIDEMIA Tolerating statin, encouraged heart healthy diet, avoid trans fats, minimize simple carbs and saturated fats. Increase exercise as tolerated  OBESITY, NOS Encouraged DASH diet, decrease po intake and increase exercise as tolerated. Needs 7-8 hours of sleep nightly. Avoid trans fats, eat small, frequent meals every 4-5 hours with lean proteins, complex carbs and healthy fats. Minimize simple carbs,  Onychomycosis Encouraged soaks in distilled white vinegar and application of Vicks Vapor rub and then referred to podiatry.   Palpitations Patient acknowledges likely anxiety , is encouraged to try a dose of Alprazolam when they occur. If not results let us know. Previous cardiac work up negative

## 2014-04-22 ENCOUNTER — Other Ambulatory Visit: Payer: Self-pay | Admitting: Family Medicine

## 2014-05-01 ENCOUNTER — Encounter: Payer: Self-pay | Admitting: Podiatry

## 2014-05-01 ENCOUNTER — Ambulatory Visit (INDEPENDENT_AMBULATORY_CARE_PROVIDER_SITE_OTHER): Payer: 59 | Admitting: Podiatry

## 2014-05-01 VITALS — BP 147/84 | HR 77 | Resp 12

## 2014-05-01 DIAGNOSIS — M79609 Pain in unspecified limb: Secondary | ICD-10-CM

## 2014-05-01 DIAGNOSIS — M79676 Pain in unspecified toe(s): Secondary | ICD-10-CM

## 2014-05-01 DIAGNOSIS — L608 Other nail disorders: Secondary | ICD-10-CM

## 2014-05-01 DIAGNOSIS — B351 Tinea unguium: Secondary | ICD-10-CM

## 2014-05-01 NOTE — Progress Notes (Signed)
   Subjective:    Patient ID: Eileen Richards, female    DOB: [birth year], 53 y.o.   MRN: [id number]  HPI  Patient presents the office today with complaints of possible nail fungus to her digits bilaterally and become painful at times. She states the nails have become thicker and had become more discolored over the last 3 years. She states that she has tried soaking the foot in vinegar as well as Vicks without any resolution of symptoms. She also states that she's had a blister on the left hallux which started approximately 1 week ago. She stated the blisters develop the same has not progressed. Her last A1c was 9.9. She works at the hospital for housekeeping.    Review of Systems  Respiratory: Positive for wheezing.   Skin:       Nail fungus  All other systems reviewed and are negative.      Objective:   Physical Exam  Nursing note and vitals reviewed. Constitutional: She is oriented to person, place, and time. She appears well-developed and well-nourished.  Musculoskeletal: Normal range of motion. She exhibits no edema.  Neurological: She is alert and oriented to person, place, and time.  Protective and vibratory sensation intact.  Skin:  Bilateral nails hypertrophic, elongated, brittle, yellow-brown discoloration x10. Small bilateral medial pinch calluses on the hallux. Small dried serous blister on the lateral aspect of the left hallux no associated fluctuance, erythema. No open wounds.  Pedal pulses palpable. CRT <3 sec.         Assessment & Plan:  53 year old diabetic female with likely onychomycosis, resolving blister left hallux. -Importance of daily foot care and inspection discussed with the patient in detail given her diabetes. -At this time the patient wishes to undergo treatment for her possible nail fungus. Nails are sharply debrided x10 without any complications. Nails will be sent for biopsy and culture to [facility] labs for definitive diagnosis of the nail fungus.  Treatment for nail fungus was discussed with patient in detail however we will await the results of the biopsy before starting treatment. Given the patient history and medications will likely tend towards topical treatment. -Recommended to leave the left hallux blister allowed at this time as it is starting to resolve. The patient to continue monitoring it. If any signs and symptoms of infection were to occur patient is to call the office immediately or go directly to the emergency room. -Patient will followup after results the nail biopsy or sooner if any problems are to arise.

## 2014-05-01 NOTE — Patient Instructions (Signed)
Diabetes and Foot Care Diabetes may cause you to have problems because of poor blood supply (circulation) to your feet and legs. This may cause the skin on your feet to become thinner, break easier, and heal more slowly. Your skin may become dry, and the skin may peel and crack. You may also have nerve damage in your legs and feet causing decreased feeling in them. You may not notice minor injuries to your feet that could lead to infections or more serious problems. Taking care of your feet is one of the most important things you can do for yourself.  HOME CARE INSTRUCTIONS  Wear shoes at all times, even in the house. Do not go barefoot. Bare feet are easily injured.  Check your feet daily for blisters, cuts, and redness. If you cannot see the bottom of your feet, use a mirror or ask someone for help.  Wash your feet with warm water (do not use hot water) and mild soap. Then pat your feet and the areas between your toes until they are completely dry. Do not soak your feet as this can dry your skin.  Apply a moisturizing lotion or petroleum jelly (that does not contain alcohol and is unscented) to the skin on your feet and to dry, brittle toenails. Do not apply lotion between your toes.  Trim your toenails straight across. Do not dig under them or around the cuticle. File the edges of your nails with an emery board or nail file.  Do not cut corns or calluses or try to remove them with medicine.  Wear clean socks or stockings every day. Make sure they are not too tight. Do not wear knee-high stockings since they may decrease blood flow to your legs.  Wear shoes that fit properly and have enough cushioning. To break in new shoes, wear them for just a few hours a day. This prevents you from injuring your feet. Always look in your shoes before you put them on to be sure there are no objects inside.  Do not cross your legs. This may decrease the blood flow to your feet.  If you find a minor scrape,  cut, or break in the skin on your feet, keep it and the skin around it clean and dry. These areas may be cleansed with mild soap and water. Do not cleanse the area with peroxide, alcohol, or iodine.  When you remove an adhesive bandage, be sure not to damage the skin around it.  If you have a wound, look at it several times a day to make sure it is healing.  Do not use heating pads or hot water bottles. They may burn your skin. If you have lost feeling in your feet or legs, you may not know it is happening until it is too late.  Make sure your health care provider performs a complete foot exam at least annually or more often if you have foot problems. Report any cuts, sores, or bruises to your health care provider immediately. SEEK MEDICAL CARE IF:   You have an injury that is not healing.  You have cuts or breaks in the skin.  You have an ingrown nail.  You notice redness on your legs or feet.  You feel burning or tingling in your legs or feet.  You have pain or cramps in your legs and feet.  Your legs or feet are numb.  Your feet always feel cold. SEEK IMMEDIATE MEDICAL CARE IF:   There is increasing redness,   swelling, or pain in or around a wound.  There is a red line that goes up your leg.  Pus is coming from a wound.  You develop a fever or as directed by your health care provider.  You notice a bad smell coming from an ulcer or wound. Document Released: 09/02/2000 Document Revised: 05/08/2013 Document Reviewed: 02/12/2013 [employer] Patient Information 2015 [employer]. This information is not intended to replace advice given to you by your health care provider. Make sure you discuss any questions you have with your health care provider.  

## 2014-05-15 ENCOUNTER — Other Ambulatory Visit: Payer: Self-pay | Admitting: Family Medicine

## 2014-05-15 NOTE — Telephone Encounter (Signed)
Rx sent to pharmacy. LDM 

## 2014-05-19 ENCOUNTER — Encounter: Payer: Self-pay | Admitting: Podiatry

## 2014-05-29 ENCOUNTER — Ambulatory Visit (INDEPENDENT_AMBULATORY_CARE_PROVIDER_SITE_OTHER): Payer: 59 | Admitting: Podiatry

## 2014-05-29 ENCOUNTER — Ambulatory Visit (INDEPENDENT_AMBULATORY_CARE_PROVIDER_SITE_OTHER): Payer: 59 | Admitting: Family Medicine

## 2014-05-29 ENCOUNTER — Encounter: Payer: Self-pay | Admitting: Family Medicine

## 2014-05-29 VITALS — BP 117/72 | HR 69 | Resp 14 | Ht 63.0 in | Wt 244.0 lb

## 2014-05-29 VITALS — BP 132/88 | HR 80 | Temp 98.5°F | Ht 64.0 in | Wt 240.2 lb

## 2014-05-29 DIAGNOSIS — E669 Obesity, unspecified: Secondary | ICD-10-CM

## 2014-05-29 DIAGNOSIS — E785 Hyperlipidemia, unspecified: Secondary | ICD-10-CM

## 2014-05-29 DIAGNOSIS — B351 Tinea unguium: Secondary | ICD-10-CM

## 2014-05-29 DIAGNOSIS — K219 Gastro-esophageal reflux disease without esophagitis: Secondary | ICD-10-CM

## 2014-05-29 DIAGNOSIS — I10 Essential (primary) hypertension: Secondary | ICD-10-CM

## 2014-05-29 MED ORDER — TRAMADOL HCL 50 MG PO TABS
100.0000 mg | ORAL_TABLET | Freq: Two times a day (BID) | ORAL | Status: DC | PRN
Start: 2014-05-29 — End: 2015-04-02

## 2014-05-29 MED ORDER — EFINACONAZOLE 10 % EX SOLN: 1.0000 [drp] | Freq: Every day | CUTANEOUS | Status: DC

## 2014-05-29 NOTE — Patient Instructions (Signed)

## 2014-05-29 NOTE — Patient Instructions (Addendum)
Probiotic daily such as Digestive Advantage, [facility], or a gneric, 64 oz of clear fluids Vinegar soaks for feet.   Constipation Constipation is when a person has fewer than three bowel movements a week, has difficulty having a bowel movement, or has stools that are dry, hard, or larger than normal. As people grow older, constipation is more common. If you try to fix constipation with medicines that make you have a bowel movement (laxatives), the problem may get worse. Long-term laxative use may cause the muscles of the colon to become weak. A low-fiber diet, not taking in enough fluids, and taking certain medicines may make constipation worse.  CAUSES   Certain medicines, such as antidepressants, pain medicine, iron supplements, antacids, and water pills.   Certain diseases, such as diabetes, irritable bowel syndrome (IBS), thyroid disease, or depression.   Not drinking enough water.   Not eating enough fiber-rich foods.   Stress or travel.   Lack of physical activity or exercise.   Ignoring the urge to have a bowel movement.   Using laxatives too much.  SIGNS AND SYMPTOMS   Having fewer than three bowel movements a week.   Straining to have a bowel movement.   Having stools that are hard, dry, or larger than normal.   Feeling full or bloated.   Pain in the lower abdomen.   Not feeling relief after having a bowel movement.  DIAGNOSIS  Your health care provider will take a medical history and perform a physical exam. Further testing may be done for severe constipation. Some tests may include:  A barium enema X-ray to examine your rectum, colon, and, sometimes, your small intestine.   A sigmoidoscopy to examine your lower colon.   A colonoscopy to examine your entire colon. TREATMENT  Treatment will depend on the severity of your constipation and what is causing it. Some dietary treatments include drinking more fluids and eating more fiber-rich  foods. Lifestyle treatments may include regular exercise. If these diet and lifestyle recommendations do not help, your health care provider may recommend taking over-the-counter laxative medicines to help you have bowel movements. Prescription medicines may be prescribed if over-the-counter medicines do not work.  HOME CARE INSTRUCTIONS   Eat foods that have a lot of fiber, such as fruits, vegetables, whole grains, and beans.  Limit foods high in fat and processed sugars, such as french fries, hamburgers, cookies, candies, and soda.   A fiber supplement may be added to your diet if you cannot get enough fiber from foods.   Drink enough fluids to keep your urine clear or pale yellow.   Exercise regularly or as directed by your health care provider.   Go to the restroom when you have the urge to go. Do not hold it.   Only take over-the-counter or prescription medicines as directed by your health care provider. Do not take other medicines for constipation without talking to your health care provider first.  [place] IF:   You have bright red blood in your stool.   Your constipation lasts for more than 4 days or gets worse.   You have abdominal or rectal pain.   You have thin, pencil-like stools.   You have unexplained weight loss. MAKE SURE YOU:   Understand these instructions.  Will watch your condition.  Will get help right away if you are not doing well or get worse. Document Released: 06/03/2004 Document Revised: 09/10/2013 Document Reviewed: 06/17/2013 [facility] Patient Information 2015 [employer]. This information is not intended to replace advice given to you by your health care provider. Make sure you discuss any questions you have with your health care provider.  

## 2014-05-29 NOTE — Progress Notes (Signed)
Pre visit review using our clinic review tool, if applicable. No additional management support is needed unless otherwise documented below in the visit note. 

## 2014-05-29 NOTE — Progress Notes (Signed)
Patient ID: Eileen Richards, female   DOB: 22-Mar-1961, 54 y.o.   MRN: [id number]  Subjective: 53 year old female returns the office today for followup evaluation and to discuss results for possible onychomycosis. She denies any changes since last appointment. Noted complaints.  Objective: AAO x 3, NAD Neurovascular status unchanged. Nails hypertrophic, dystrophic, brittle, yellow discoloration x10. No surrounding erythema or drainage. No leg pain, swelling, warmth.  Assessment: 53 year old female with onychomycosis  Plan: -Discussed various treatment options including alternatives, risks, complications. Discussed lab results which revealed onychomycosis. -At this time the patient elects to proceed with Jublia. A prescription for this was sent to [facility] and she was informed side effects the medication. She was also instructed how to apply the medication and she was given written instructions. If she were to develop and she was directed to stop immediately and call the office. -Followup as needed or any change in symptoms, questions or concerns.

## 2014-06-01 ENCOUNTER — Encounter: Payer: Self-pay | Admitting: Family Medicine

## 2014-06-01 NOTE — Assessment & Plan Note (Signed)
Tolerating statin, encouraged heart healthy diet, avoid trans fats, minimize simple carbs and saturated fats. Increase exercise as tolerated 

## 2014-06-01 NOTE — Assessment & Plan Note (Signed)
Is following with podiatry, has been given an RX for Jublia, try soaks in Vinegar nightly

## 2014-06-01 NOTE — Assessment & Plan Note (Signed)
Encouraged DASH diet, decrease po intake and increase exercise as tolerated. Needs 7-8 hours of sleep nightly. Avoid trans fats, eat small, frequent meals every 4-5 hours with lean proteins, complex carbs and healthy fats. Minimize simple carbs 

## 2014-06-01 NOTE — Assessment & Plan Note (Signed)
Avoid offending foods, start probiotics. Do not eat large meals in late evening and consider raising head of bed.  

## 2014-06-01 NOTE — Progress Notes (Signed)
Patient ID: Eileen Richards, female   DOB: [birth year], 53 y.o.   MRN: [id number] Eileen Richards [id number] [birth year] 06/01/2014      Progress Note-Follow Up  Subjective  Chief Complaint  Chief Complaint  Patient presents with  . Follow-up    6 week    HPI  Patient is a 53 year old female in today for routine medical care. No recent illness. Reports blood sugars are improving. No polyuria, polydipsia. Denies CP/palp/SOB/HA/congestion/fevers/GI or GU c/o. Taking meds as prescribed  Past Medical History  Diagnosis Date  . Diabetes mellitus   . Hypertension   . Hyperlipemia   . Atypical chest pain     normal stress echo 02/2009 -LV size was normal LV global systolic function was normal- Normal wall motion; no LV regional wall motion abnormalities  . Asthma     childhood  . GERD (gastroesophageal reflux disease)   . Pain of right heel 10/14/2012  . Achilles tendonitis 10/14/2012  . Chest pain, atypical 08/24/2013  . Onychomycosis 04/17/2014    Past Surgical History  Procedure Laterality Date  . Abdominal hysterectomy    . Dilation and curettage of uterus      Lysis of Adhesions  . Robotic assisted salpingo oopherectomy  07/23/2012    Procedure: ROBOTIC ASSISTED SALPINGO OOPHERECTOMY;  Surgeon: Marvene Staff, MD;  Location: [facility];  Service: Gynecology;  Laterality: Bilateral;  . Robotic assisted laparoscopic lysis of adhesion  07/23/2012    Procedure: ROBOTIC ASSISTED LAPAROSCOPIC LYSIS OF ADHESION;  Surgeon: Marvene Staff, MD;  Location: [facility];  Service: Gynecology;  Laterality: N/A;  . Cystoscopy  07/23/2012    Procedure: CYSTOSCOPY;  Surgeon: Marvene Staff, MD;  Location: [facility];  Service: Gynecology;  Laterality: N/A;    Family History  Problem Relation Age of Onset  . GER disease Mother   . GER disease Father   . Heart disease Father     father has "heart problems"    History   Social History  . Marital Status: Single    Spouse Name: N/A   Number of Children: N/A  . Years of Education: N/A   Occupational History  . Not on file.   Social History Main Topics  . Smoking status: Former Research scientist (life sciences)  . Smokeless tobacco: Never Used  . Alcohol Use: No  . Drug Use: No  . Sexual Activity: Not on file   Other Topics Concern  . Not on file   Social History Narrative  . No narrative on file    Current Outpatient Prescriptions on File Prior to Visit  Medication Sig Dispense Refill  . albuterol (PROVENTIL HFA;VENTOLIN HFA) 108 (90 BASE) MCG/ACT inhaler Inhale 2 puffs into the lungs every 6 (six) hours as needed for wheezing.  1 Inhaler  2  . ALPRAZolam (XANAX) 0.25 MG tablet Take 1 tablet (0.25 mg total) by mouth 2 (two) times daily as needed for anxiety (palpitations).  20 tablet  0  . aspirin 81 MG tablet Take 81 mg by mouth daily.      . Blood Glucose Monitoring Suppl (TRUETRACK BLOOD GLUCOSE) W/DEVICE KIT by Does not apply route.        . Canagliflozin 100 MG TABS Take 1 tablet (100 mg total) by mouth daily.  30 tablet  2  . carvedilol (COREG) 3.125 MG tablet TAKE 1 TABLET BY MOUTH 2 TIMES DAILY WITH A MEAL  60 tablet  3  . Colesevelam HCl (WELCHOL) 3.75 G PACK Take 1 packet by  mouth daily.  30 each  2  . glimepiride (AMARYL) 4 MG tablet TAKE 1 TABLET BY MOUTH TWICE DAILY  60 tablet  11  . hydrochlorothiazide (HYDRODIURIL) 25 MG tablet TAKE 1 TABLET BY MOUTH DAILY.  90 tablet  3  . JANUVIA 100 MG tablet TAKE 1 TABLET BY MOUTH DAILY.  30 tablet  3  . Lancet Device MISC by Does not apply route. Use to test blood sugar three times daily       . metFORMIN (GLUCOPHAGE-XR) 500 MG 24 hr tablet 2 tabs po bid and 1 tab po q noon  150 tablet  3  . Multiple Vitamin (MULTIVITAMIN) tablet Take 1 tablet by mouth daily.        . nitroGLYCERIN (NITROSTAT) 0.4 MG SL tablet Place 1 tablet (0.4 mg total) under the tongue every 5 (five) minutes as needed for chest pain.  25 tablet  3  . pantoprazole (PROTONIX) 40 MG tablet TAKE 1 TABLET BY MOUTH  DAILY  30 tablet  1  . simvastatin (ZOCOR) 20 MG tablet TAKE 1 TABLET BY MOUTH AT BEDTIME.  90 tablet  5  . TRUETEST TEST test strip USE TO TEST BLOOD SUGAR THREE TIMES A DAY  100 each  3   No current facility-administered medications on file prior to visit.    No Known Allergies  Review of Systems  Review of Systems  Constitutional: Negative for fever and malaise/fatigue.  HENT: Negative for congestion.   Eyes: Negative for discharge.  Respiratory: Negative for shortness of breath.   Cardiovascular: Negative for chest pain, palpitations and leg swelling.  Gastrointestinal: Negative for nausea, abdominal pain and diarrhea.  Genitourinary: Negative for dysuria.  Musculoskeletal: Negative for falls.  Skin: Negative for rash.  Neurological: Negative for loss of consciousness and headaches.  Endo/Heme/Allergies: Negative for polydipsia.  Psychiatric/Behavioral: Negative for depression and suicidal ideas. The patient is not nervous/anxious and does not have insomnia.     Objective  BP 132/88  Pulse 80  Temp(Src) 98.5 F (36.9 C) (Oral)  Ht $R'5\' 4"'Bd$  (1.626 m)  Wt 240 lb 3.2 oz (108.954 kg)  BMI 41.21 kg/m2  SpO2 96%  Physical Exam  Physical Exam  Constitutional: She is oriented to person, place, and time and well-developed, well-nourished, and in no distress. No distress.  HENT:  Head: Normocephalic and atraumatic.  Eyes: Conjunctivae are normal.  Neck: Neck supple. No thyromegaly present.  Cardiovascular: Normal rate, regular rhythm and normal heart sounds.   No murmur heard. Pulmonary/Chest: Effort normal and breath sounds normal. She has no wheezes.  Abdominal: She exhibits no distension and no mass.  Musculoskeletal: She exhibits no edema.  Lymphadenopathy:    She has no cervical adenopathy.  Neurological: She is alert and oriented to person, place, and time.  Skin: Skin is warm and dry. No rash noted. She is not diaphoretic.  Psychiatric: Memory, affect and judgment  normal.    Lab Results  Component Value Date   TSH 3.12 11/08/2013   Lab Results  Component Value Date   WBC 11.4* 11/08/2013   HGB 13.6 11/08/2013   HCT 41.5 11/08/2013   MCV 92.7 11/08/2013   PLT 374.0 11/08/2013   Lab Results  Component Value Date   CREATININE 0.8 11/08/2013   BUN 14 11/08/2013   NA 140 11/08/2013   K 4.2 11/08/2013   CL 103 11/08/2013   CO2 31 11/08/2013   Lab Results  Component Value Date   ALT 15 11/08/2013  AST 15 11/08/2013   ALKPHOS 78 11/08/2013   BILITOT 0.3 11/08/2013   Lab Results  Component Value Date   CHOL 153 11/08/2013   Lab Results  Component Value Date   HDL 35.40* 11/08/2013   Lab Results  Component Value Date   LDLCALC 92 11/08/2013   Lab Results  Component Value Date   TRIG 128.0 11/08/2013   Lab Results  Component Value Date   CHOLHDL 4 11/08/2013     Assessment & Plan  HYPERTENSION Improved on recheck. Well controlled, no changes to meds. Encouraged heart healthy diet such as the DASH diet and exercise as tolerated.   OBESITY, NOS Encouraged DASH diet, decrease po intake and increase exercise as tolerated. Needs 7-8 hours of sleep nightly. Avoid trans fats, eat small, frequent meals every 4-5 hours with lean proteins, complex carbs and healthy fats. Minimize simple carbs  GERD (gastroesophageal reflux disease) Avoid offending foods, start probiotics. Do not eat large meals in late evening and consider raising head of bed.   HYPERLIPIDEMIA Tolerating statin, encouraged heart healthy diet, avoid trans fats, minimize simple carbs and saturated fats. Increase exercise as tolerated  Onychomycosis Is following with podiatry, has been given an RX for Jublia, try soaks in Vinegar nightly

## 2014-06-01 NOTE — Assessment & Plan Note (Signed)
Improved on recheck. Well controlled, no changes to meds. Encouraged heart healthy diet such as the DASH diet and exercise as tolerated.  

## 2014-06-09 ENCOUNTER — Other Ambulatory Visit: Payer: Self-pay | Admitting: Family Medicine

## 2014-06-11 ENCOUNTER — Other Ambulatory Visit: Payer: Self-pay | Admitting: Family Medicine

## 2014-06-11 NOTE — Telephone Encounter (Signed)
Caller name: Leiah  Relation to pt: self  Call back number: [phone] Pharmacy: [facility] [phone]   Reason for call: pharmacy never received traMADol (ULTRAM) 50 MG tablet pt would like to be notified when rx is ready for pick up

## 2014-06-12 ENCOUNTER — Encounter: Payer: 59 | Attending: Family Medicine | Admitting: Dietician

## 2014-06-12 ENCOUNTER — Encounter: Payer: Self-pay | Admitting: Dietician

## 2014-06-12 VITALS — Ht 64.0 in | Wt 245.0 lb

## 2014-06-12 DIAGNOSIS — E119 Type 2 diabetes mellitus without complications: Secondary | ICD-10-CM | POA: Diagnosis present

## 2014-06-12 DIAGNOSIS — E669 Obesity, unspecified: Secondary | ICD-10-CM | POA: Insufficient documentation

## 2014-06-12 DIAGNOSIS — Z713 Dietary counseling and surveillance: Secondary | ICD-10-CM | POA: Insufficient documentation

## 2014-06-12 DIAGNOSIS — Z6841 Body Mass Index (BMI) 40.0 and over, adult: Secondary | ICD-10-CM | POA: Diagnosis not present

## 2014-06-12 NOTE — Progress Notes (Signed)
Medical Nutrition Therapy:  Appt start time: 9449 end time:  1700.   Assessment:  Primary concerns today: Eileen Richards is referred today for weight and blood sugar management. Her most recent HgA1c was 9.9% on 04/02/14.  Patient has had diabetes education years ago and is not currently following a meal plan.  She checks her BG twice a day or when she feels funny and reports a range of 120-175.  She is working two jobs (one with [employer]) and states that her biggest challenge is her schedule.  Her jobs overlap in a way where she normally does not have a full day off during the week.  She lives alone with her dogs and cooks for herself.  She states she does not go out to eat that often but has been more recently picking up food through the drive-thru.  She would pick up things like fried fish, fried chicken, and subs.  She states she loves starches and is not crazy about vegetables. She does like greens.  She gets bored of eating certain foods that she tries to make herself eat, such as salad.      Learning Readiness:  Ready  MEDICATIONS: see list   DIETARY INTAKE:  Usual eating pattern includes 2 meals and 0 snacks per day.  24-hr recall:  B ( AM): coffee with hazelnut creamer and artificial sweetener  Snk ( AM): none  L (3 PM): usually brings lunch as leftovers from night before Snk ( PM): none D (11 PM): pick up something on the way home or makes spaghetti, or meat, baked potato and green beans  Snk ( PM): none Beverages: water all day at work, sodas all day long, lemonade, sweet tea when out to eat, fruit juice at work sometimes but not at the house  Usual physical activity: none  Progress Towards Goal(s):  In progress.   Nutritional Diagnosis:  NI-5.8.2 Excessive carbohydrate intake As related to frequent consumption of starch servings, foods outside of the home, and sugary beverages.  As evidenced by dietary recall and HgA1c of 9.9%.    Intervention:  Nutrition education for weight  and blood sugar management with diet/lifestyle changes.  Reviewed that carbohydrates are the foods that affect blood sugar.  Listed carbohydrate-containing foods.  Stated that dietary strategy for blood sugar control and weight loss is to limit these foods to 2-3 servings (30-45 grams) at each meal and balance these food choices with lean protein and non-starchy vegetables. Patient was agreeable to this plan. Utilized MyPlate as a tool for portion control and healthy, balanced meals.  Patient was able to accurately plan a meal that would fit these guidelines.  Recommended limiting fried foods and choosing baked, broiled, and grilled proteins.  Recommended purchasing low-fat dairy foods and focus on plant-based fats like oils for cooking.  Demonstrated how to read the Nutrition Facts Label and stated she should count every 15 grams of total carbs as a serving for her meal plan. Discussed importance of exercise but assessed patient is not ready to set exercise goals at this time.  She reports using the stairs at work.  Discussed sugary beverages and their impact on weight gain and increased blood sugar.  Patient stated she would stop buying sodas and choose water or artificially flavored drinks instead.  Together we set the following goals:  Goals:  Follow Diabetes Meal Plan as instructed  Eat 3 meals every day, try not to go longer than 4-5 hrs without eating  Limit carbohydrate intake  to 2-3 servings carbohydrate per meal  Add lean protein foods to meals and snacks  Limit fried foods to 1-2 times each week  Reduce intake of sugary beverages like soda and sweet tea  Monitor glucose levels as instructed by your doctor  Teaching Method Utilized all of the following: Visual Auditory Hands on  Handouts given during visit include:  Yellow card meal planner  Nutrition facts label  Barriers to learning/adherence to lifestyle change: none  Demonstrated degree of understanding via:  Teach Back    Monitoring/Evaluation:  Dietary intake, exercise, labs, and body weight in 3 month(s).

## 2014-06-12 NOTE — Patient Instructions (Signed)
Goals:  Follow Diabetes Meal Plan as instructed  Eat 3 meals every day, try not to go longer than 4-5 hrs without eating  Limit carbohydrate intake to 2-3 servings carbohydrate per meal  Add lean protein foods to meals and snacks  Limit fried foods to 1-2 times each week  Reduce intake of sugary beverages like soda and sweet tea  Monitor glucose levels as instructed by your doctor

## 2014-07-11 ENCOUNTER — Telehealth: Payer: Self-pay | Admitting: Family Medicine

## 2014-07-11 NOTE — Telephone Encounter (Signed)
Pt called and was advised and will consider going to [facility] Saturday [facility] and pt will try OTC meds recommenned

## 2014-07-11 NOTE — Telephone Encounter (Signed)
Caller name: Markiya  Call back [phone]   Reason for call:  Needs to ask questions about medications.  Did want to speak any further about the matter.

## 2014-07-11 NOTE — Telephone Encounter (Signed)
Patient states she has a "bad"cold  Headache, head congestion, nose "running", eyes watering- no sore throat, no cough X 3 days  Went to the pharmacy and got sinus medications but its not helping  Pt would like an antibiotic sent to the pharmacy?  Please advise?

## 2014-07-11 NOTE — Telephone Encounter (Signed)
Sorry but antibiotics do not help viruses/cold, should be seen to get an antibiotic, only appropriate with certain findings. Recommend Encouraged increased rest and hydration, add probiotics, zinc such as Coldeze or Xicam. Treat fevers as needed and could be seen at Saturday clinic if need be, please schedule if she is willing

## 2014-07-16 ENCOUNTER — Ambulatory Visit (INDEPENDENT_AMBULATORY_CARE_PROVIDER_SITE_OTHER): Payer: 59 | Admitting: Podiatry

## 2014-07-16 ENCOUNTER — Ambulatory Visit (INDEPENDENT_AMBULATORY_CARE_PROVIDER_SITE_OTHER): Payer: 59

## 2014-07-16 ENCOUNTER — Encounter: Payer: Self-pay | Admitting: Podiatry

## 2014-07-16 VITALS — BP 125/76 | HR 66 | Resp 14

## 2014-07-16 DIAGNOSIS — M7662 Achilles tendinitis, left leg: Secondary | ICD-10-CM

## 2014-07-16 DIAGNOSIS — B351 Tinea unguium: Secondary | ICD-10-CM

## 2014-07-16 DIAGNOSIS — M79673 Pain in unspecified foot: Secondary | ICD-10-CM

## 2014-07-16 DIAGNOSIS — M7661 Achilles tendinitis, right leg: Secondary | ICD-10-CM

## 2014-07-16 DIAGNOSIS — M7730 Calcaneal spur, unspecified foot: Secondary | ICD-10-CM

## 2014-07-16 MED ORDER — TAVABOROLE 5 % EX SOLN: 1.0000 [drp] | CUTANEOUS | Status: DC

## 2014-07-16 NOTE — Patient Instructions (Signed)
Achilles Tendinitis   with Rehab  Achilles tendinitis is a disorder of the Achilles tendon. The Achilles tendon connects the large calf muscles (Gastrocnemius and Soleus) to the heel bone (calcaneus). This tendon is sometimes called the heel cord. It is important for pushing-off and standing on your toes and is important for walking, running, or jumping. Tendinitis is often caused by overuse and repetitive microtrauma.  SYMPTOMS  · Pain, tenderness, swelling, warmth, and redness may occur over the Achilles tendon even at rest.  · Pain with pushing off, or flexing or extending the ankle.  · Pain that is worsened after or during activity.  CAUSES   · Overuse sometimes seen with rapid increase in exercise programs or in sports requiring running and jumping.  · Poor physical conditioning (strength and flexibility or endurance).  · Running sports, especially training running down hills.  · Inadequate warm-up before practice or play or failure to stretch before participation.  · Injury to the tendon.  PREVENTION   · Warm up and stretch before practice or competition.  · Allow time for adequate rest and recovery between practices and competition.  · Keep up conditioning.  ¨ Keep up ankle and leg flexibility.  ¨ Improve or keep muscle strength and endurance.  ¨ Improve cardiovascular fitness.  · Use proper technique.  · Use proper equipment (shoes, skates).  · To help prevent recurrence, taping, protective strapping, or an adhesive bandage may be recommended for several weeks after healing is complete.  PROGNOSIS   · Recovery may take weeks to several months to heal.  · Longer recovery is expected if symptoms have been prolonged.  · Recovery is usually quicker if the inflammation is due to a direct blow as compared with overuse or sudden strain.  RELATED COMPLICATIONS   · Healing time will be prolonged if the condition is not correctly treated. The injury must be given plenty of time to heal.  · Symptoms can reoccur if  activity is resumed too soon.  · Untreated, tendinitis may increase the risk of tendon rupture requiring additional time for recovery and possibly surgery.  TREATMENT   · The first treatment consists of rest anti-inflammatory medication, and ice to relieve the pain.  · Stretching and strengthening exercises after resolution of pain will likely help reduce the risk of recurrence. Referral to a physical therapist or athletic trainer for further evaluation and treatment may be helpful.  · A walking boot or cast may be recommended to rest the Achilles tendon. This can help break the cycle of inflammation and microtrauma.  · Arch supports (orthotics) may be prescribed or recommended by your caregiver as an adjunct to therapy and rest.  · Surgery to remove the inflamed tendon lining or degenerated tendon tissue is rarely necessary and has shown less than predictable results.  MEDICATION   · Nonsteroidal anti-inflammatory medications, such as aspirin and ibuprofen, may be used for pain and inflammation relief. Do not take within 7 days before surgery. Take these as directed by your caregiver. Contact your caregiver immediately if any bleeding, stomach upset, or signs of allergic reaction occur. Other minor pain relievers, such as acetaminophen, may also be used.  · Pain relievers may be prescribed as necessary by your caregiver. Do not take prescription pain medication for longer than 4 to 7 days. Use only as directed and only as much as you need.  · Cortisone injections are rarely indicated. Cortisone injections may weaken tendons and predispose to rupture. It is better   to give the condition more time to heal than to use them.  HEAT AND COLD  · Cold is used to relieve pain and reduce inflammation for acute and chronic Achilles tendinitis. Cold should be applied for 10 to 15 minutes every 2 to 3 hours for inflammation and pain and immediately after any activity that aggravates your symptoms. Use ice packs or an ice  massage.  · Heat may be used before performing stretching and strengthening activities prescribed by your caregiver. Use a heat pack or a warm soak.  SEEK MEDICAL CARE IF:  · Symptoms get worse or do not improve in 2 weeks despite treatment.  · New, unexplained symptoms develop. Drugs used in treatment may produce side effects.  EXERCISES  RANGE OF MOTION (ROM) AND STRETCHING EXERCISES - Achilles Tendinitis   These exercises may help you when beginning to rehabilitate your injury. Your symptoms may resolve with or without further involvement from your physician, physical therapist or athletic trainer. While completing these exercises, remember:   · Restoring tissue flexibility helps normal motion to return to the joints. This allows healthier, less painful movement and activity.  · An effective stretch should be held for at least 30 seconds.  · A stretch should never be painful. You should only feel a gentle lengthening or release in the stretched tissue.  STRETCH - Gastroc, Standing   · Place hands on wall.  · Extend right / left leg, keeping the front knee somewhat bent.  · Slightly point your toes inward on your back foot.  · Keeping your right / left heel on the floor and your knee straight, shift your weight toward the wall, not allowing your back to arch.  · You should feel a gentle stretch in the right / left calf. Hold this position for __________ seconds.  Repeat __________ times. Complete this stretch __________ times per day.  STRETCH - Soleus, Standing   · Place hands on wall.  · Extend right / left leg, keeping the other knee somewhat bent.  · Slightly point your toes inward on your back foot.  · Keep your right / left heel on the floor, bend your back knee, and slightly shift your weight over the back leg so that you feel a gentle stretch deep in your back calf.  · Hold this position for __________ seconds.  Repeat __________ times. Complete this stretch __________ times per day.  STRETCH -  Gastrocsoleus, Standing   Note: This exercise can place a lot of stress on your foot and ankle. Please complete this exercise only if specifically instructed by your caregiver.   · Place the ball of your right / left foot on a step, keeping your other foot firmly on the same step.  · Hold on to the wall or a rail for balance.  · Slowly lift your other foot, allowing your body weight to press your heel down over the edge of the step.  · You should feel a stretch in your right / left calf.  · Hold this position for __________ seconds.  · Repeat this exercise with a slight bend in your knee.  Repeat __________ times. Complete this stretch __________ times per day.   STRENGTHENING EXERCISES - Achilles Tendinitis  These exercises may help you when beginning to rehabilitate your injury. They may resolve your symptoms with or without further involvement from your physician, physical therapist or athletic trainer. While completing these exercises, remember:   · Muscles can gain both the endurance   and the strength needed for everyday activities through controlled exercises.  · Complete these exercises as instructed by your physician, physical therapist or athletic trainer. Progress the resistance and repetitions only as guided.  · You may experience muscle soreness or fatigue, but the pain or discomfort you are trying to eliminate should never worsen during these exercises. If this pain does worsen, stop and make certain you are following the directions exactly. If the pain is still present after adjustments, discontinue the exercise until you can discuss the trouble with your clinician.  STRENGTH - Plantar-flexors   · Sit with your right / left leg extended. Holding onto both ends of a rubber exercise band/tubing, loop it around the ball of your foot. Keep a slight tension in the band.  · Slowly push your toes away from you, pointing them downward.  · Hold this position for __________ seconds. Return slowly, controlling the  tension in the band/tubing.  Repeat __________ times. Complete this exercise __________ times per day.   STRENGTH - Plantar-flexors   · Stand with your feet shoulder width apart. Steady yourself with a wall or table using as little support as needed.  · Keeping your weight evenly spread over the width of your feet, rise up on your toes.*  · Hold this position for __________ seconds.  Repeat __________ times. Complete this exercise __________ times per day.   *If this is too easy, shift your weight toward your right / left leg until you feel challenged. Ultimately, you may be asked to do this exercise with your right / left foot only.  STRENGTH - Plantar-flexors, Eccentric   Note: This exercise can place a lot of stress on your foot and ankle. Please complete this exercise only if specifically instructed by your caregiver.   · Place the balls of your feet on a step. With your hands, use only enough support from a wall or rail to keep your balance.  · Keep your knees straight and rise up on your toes.  · Slowly shift your weight entirely to your right / left toes and pick up your opposite foot. Gently and with controlled movement, lower your weight through your right / left foot so that your heel drops below the level of the step. You will feel a slight stretch in the back of your calf at the end position.  · Use the healthy leg to help rise up onto the balls of both feet, then lower weight only on the right / left leg again. Build up to 15 repetitions. Then progress to 3 consecutive sets of 15 repetitions.*  · After completing the above exercise, complete the same exercise with a slight knee bend (about 30 degrees). Again, build up to 15 repetitions. Then progress to 3 consecutive sets of 15 repetitions.*  Perform this exercise __________ times per day.   *When you easily complete 3 sets of 15, your physician, physical therapist or athletic trainer may advise you to add resistance by wearing a backpack filled with  additional weight.  STRENGTH - Plantar Flexors, Seated   · Sit on a chair that allows your feet to rest flat on the ground. If necessary, sit at the edge of the chair.  · Keeping your toes firmly on the ground, lift your right / left heel as far as you can without increasing any discomfort in your ankle.  Repeat __________ times. Complete this exercise __________ times a day.  *If instructed by your physician, physical therapist or athletic   trainer, you may add ____________________ of resistance by placing a weighted object on your right / left knee.  Document Released: 04/06/2005 Document Revised: 11/28/2011 Document Reviewed: 12/18/2008  [employer]® Patient Information ©2015 [employer]. This information is not intended to replace advice given to you by your health care provider. Make sure you discuss any questions you have with your health care provider.

## 2014-07-18 NOTE — Progress Notes (Signed)
Patient ID: Eileen Richards, female   DOB: 11-Dec-1960, 53 y.o.   MRN: [id number]  Subjective: Patient returns to the office today with complaints of heel pain in the back of her heels. She states that the left is greater than the right has increased over the last couple months his last appointment. She denies any acute trauma or injury to the area. States that the area is painful mostly after activity and after being on her feet all day. Also states that she no longer was using the [place] as the cost when up. Denies any acute changes since last appointment. No other complaints at this time.  Objective: AAO x3, NAD DP/PT pulses palpable bilaterally, CRT less than 3 seconds Protective sensation intact with Simms Weinstein monofilament, vibratory sensation intact, Achilles tendon reflex intact Tenderness to palpation of the posterior aspect of the calcaneus at the insertion of the Achilles tendon. Palpable retrocalcaneal exostosis present. No pain along the course of the Achilles tendon. Thompson test was performed and the Achilles tendon appears to be intact. No pain with lateral compression of the calcaneus or along the inferior aspect. No overlying edema, erythema, increased warmth. MMT 5/5, ROM WNL No calf pain, swelling, warmth, erythema No open lesions Nails hypertrophic, dystrophic, brittle, discolored 10 without any surrounding erythema or drainage.  Assessment: 53 year old female with bilateral retrocalcaneal exostosis and symptomatic insertional Achilles tendinitis; onychomycosis  Plan: -X-rays were obtained and reviewed with the patient. -Conservative versus surgical treatment discussed including alternatives, risks, complications -Dispensed heel lifts to wear her shoes. Discussed with her that as her symptoms decreased to slowly remove the lifts from the shoe. -Discussed stretching exercises to gradually start. If symptoms increase to discontinue and call the office. -Ice to the  affected area. -Discussed the importance of supportive shoe gear. -As Jublia was too expensive, I prescribed Kerydin and sent a prescription to [facility] and she was given information on how to follow up with them. Discussed side effects of medication and directed to stop if any are to occur, and call the office. If [place] Mon is too expensive as well, she can try OTC fungi-nail or Penlac.  -Follow up in 3 weeks or sooner if any problems arise, or any change in symptoms. In the meantime, call the office with any questions, concerns.

## 2014-07-21 ENCOUNTER — Other Ambulatory Visit: Payer: Self-pay | Admitting: Family Medicine

## 2014-07-31 ENCOUNTER — Other Ambulatory Visit: Payer: Self-pay

## 2014-07-31 ENCOUNTER — Other Ambulatory Visit (INDEPENDENT_AMBULATORY_CARE_PROVIDER_SITE_OTHER): Payer: 59

## 2014-07-31 DIAGNOSIS — I10 Essential (primary) hypertension: Secondary | ICD-10-CM

## 2014-07-31 DIAGNOSIS — E119 Type 2 diabetes mellitus without complications: Secondary | ICD-10-CM

## 2014-07-31 DIAGNOSIS — E785 Hyperlipidemia, unspecified: Secondary | ICD-10-CM

## 2014-08-01 LAB — RENAL FUNCTION PANEL
Albumin: 3.3 g/dL — ABNORMAL LOW (ref 3.5–5.2)
BUN: 18 mg/dL (ref 6–23)
CO2: 23 mEq/L (ref 19–32)
Calcium: 9.3 mg/dL (ref 8.4–10.5)
Chloride: 105 mEq/L (ref 96–112)
Creatinine, Ser: 0.8 mg/dL (ref 0.4–1.2)
GFR: 102.88 mL/min (ref >60.00)
Glucose, Bld: 96 mg/dL (ref 70–99)
Phosphorus: 2.9 mg/dL (ref 2.3–4.6)
Potassium: 3.8 mEq/L (ref 3.5–5.1)
Sodium: 142 mEq/L (ref 135–145)

## 2014-08-01 LAB — CBC WITH DIFFERENTIAL/PLATELET
Basophils Absolute: 0 10*3/uL (ref 0.0–0.1)
Basophils Relative: 0.3 % (ref 0.0–3.0)
Eosinophils Absolute: 0.1 10*3/uL (ref 0.0–0.7)
Eosinophils Relative: 1.4 % (ref 0.0–5.0)
HCT: 44.9 % (ref 36.0–46.0)
Hemoglobin: 14.3 g/dL (ref 12.0–15.0)
Lymphocytes Relative: 20.1 % (ref 12.0–46.0)
Lymphs Abs: 2.1 10*3/uL (ref 0.7–4.0)
MCHC: 31.8 g/dL (ref 30.0–36.0)
MCV: 91.8 fl (ref 78.0–100.0)
Monocytes Absolute: 0.2 10*3/uL (ref 0.1–1.0)
Monocytes Relative: 2.3 % — ABNORMAL LOW (ref 3.0–12.0)
Neutro Abs: 7.9 10*3/uL — ABNORMAL HIGH (ref 1.4–7.7)
Neutrophils Relative %: 75.9 % (ref 43.0–77.0)
Platelets: 420 10*3/uL — ABNORMAL HIGH (ref 150.0–400.0)
RBC: 4.89 Mil/uL (ref 3.87–5.11)
RDW: 14.6 % (ref 11.5–15.5)
WBC: 10.4 10*3/uL (ref 4.0–10.5)

## 2014-08-01 LAB — HEPATIC FUNCTION PANEL
ALT: 19 U/L (ref 0–35)
AST: 18 U/L (ref 0–37)
Albumin: 3.3 g/dL — ABNORMAL LOW (ref 3.5–5.2)
Alkaline Phosphatase: 71 U/L (ref 39–117)
Bilirubin, Direct: 0 mg/dL (ref 0.0–0.3)
Total Bilirubin: 0.5 mg/dL (ref 0.2–1.2)
Total Protein: 7.2 g/dL (ref 6.0–8.3)

## 2014-08-01 LAB — LIPID PANEL
Cholesterol: 161 mg/dL (ref 0–200)
HDL: 31.7 mg/dL — ABNORMAL LOW (ref >39.00)
LDL Cholesterol: 111 mg/dL — ABNORMAL HIGH (ref 0–99)
NonHDL: 129.3
Total CHOL/HDL Ratio: 5
Triglycerides: 92 mg/dL (ref 0.0–149.0)
VLDL: 18.4 mg/dL (ref 0.0–40.0)

## 2014-08-01 LAB — HEMOGLOBIN A1C: Hgb A1c MFr Bld: 7.8 % — ABNORMAL HIGH (ref 4.6–6.5)

## 2014-08-01 LAB — TSH: TSH: 1.22 u[IU]/mL (ref 0.35–4.50)

## 2014-08-06 ENCOUNTER — Ambulatory Visit (INDEPENDENT_AMBULATORY_CARE_PROVIDER_SITE_OTHER): Payer: 59 | Admitting: Podiatry

## 2014-08-06 ENCOUNTER — Encounter: Payer: Self-pay | Admitting: Podiatry

## 2014-08-06 VITALS — BP 113/69 | HR 88 | Resp 12

## 2014-08-06 DIAGNOSIS — M7730 Calcaneal spur, unspecified foot: Secondary | ICD-10-CM

## 2014-08-06 DIAGNOSIS — M7662 Achilles tendinitis, left leg: Secondary | ICD-10-CM

## 2014-08-06 DIAGNOSIS — M7661 Achilles tendinitis, right leg: Secondary | ICD-10-CM

## 2014-08-06 NOTE — Patient Instructions (Signed)
Achilles Tendinitis   with Rehab  Achilles tendinitis is a disorder of the Achilles tendon. The Achilles tendon connects the large calf muscles (Gastrocnemius and Soleus) to the heel bone (calcaneus). This tendon is sometimes called the heel cord. It is important for pushing-off and standing on your toes and is important for walking, running, or jumping. Tendinitis is often caused by overuse and repetitive microtrauma.  SYMPTOMS  · Pain, tenderness, swelling, warmth, and redness may occur over the Achilles tendon even at rest.  · Pain with pushing off, or flexing or extending the ankle.  · Pain that is worsened after or during activity.  CAUSES   · Overuse sometimes seen with rapid increase in exercise programs or in sports requiring running and jumping.  · Poor physical conditioning (strength and flexibility or endurance).  · Running sports, especially training running down hills.  · Inadequate warm-up before practice or play or failure to stretch before participation.  · Injury to the tendon.  PREVENTION   · Warm up and stretch before practice or competition.  · Allow time for adequate rest and recovery between practices and competition.  · Keep up conditioning.  ¨ Keep up ankle and leg flexibility.  ¨ Improve or keep muscle strength and endurance.  ¨ Improve cardiovascular fitness.  · Use proper technique.  · Use proper equipment (shoes, skates).  · To help prevent recurrence, taping, protective strapping, or an adhesive bandage may be recommended for several weeks after healing is complete.  PROGNOSIS   · Recovery may take weeks to several months to heal.  · Longer recovery is expected if symptoms have been prolonged.  · Recovery is usually quicker if the inflammation is due to a direct blow as compared with overuse or sudden strain.  RELATED COMPLICATIONS   · Healing time will be prolonged if the condition is not correctly treated. The injury must be given plenty of time to heal.  · Symptoms can reoccur if  activity is resumed too soon.  · Untreated, tendinitis may increase the risk of tendon rupture requiring additional time for recovery and possibly surgery.  TREATMENT   · The first treatment consists of rest anti-inflammatory medication, and ice to relieve the pain.  · Stretching and strengthening exercises after resolution of pain will likely help reduce the risk of recurrence. Referral to a physical therapist or athletic trainer for further evaluation and treatment may be helpful.  · A walking boot or cast may be recommended to rest the Achilles tendon. This can help break the cycle of inflammation and microtrauma.  · Arch supports (orthotics) may be prescribed or recommended by your caregiver as an adjunct to therapy and rest.  · Surgery to remove the inflamed tendon lining or degenerated tendon tissue is rarely necessary and has shown less than predictable results.  MEDICATION   · Nonsteroidal anti-inflammatory medications, such as aspirin and ibuprofen, may be used for pain and inflammation relief. Do not take within 7 days before surgery. Take these as directed by your caregiver. Contact your caregiver immediately if any bleeding, stomach upset, or signs of allergic reaction occur. Other minor pain relievers, such as acetaminophen, may also be used.  · Pain relievers may be prescribed as necessary by your caregiver. Do not take prescription pain medication for longer than 4 to 7 days. Use only as directed and only as much as you need.  · Cortisone injections are rarely indicated. Cortisone injections may weaken tendons and predispose to rupture. It is better   to give the condition more time to heal than to use them.  HEAT AND COLD  · Cold is used to relieve pain and reduce inflammation for acute and chronic Achilles tendinitis. Cold should be applied for 10 to 15 minutes every 2 to 3 hours for inflammation and pain and immediately after any activity that aggravates your symptoms. Use ice packs or an ice  massage.  · Heat may be used before performing stretching and strengthening activities prescribed by your caregiver. Use a heat pack or a warm soak.  SEEK MEDICAL CARE IF:  · Symptoms get worse or do not improve in 2 weeks despite treatment.  · New, unexplained symptoms develop. Drugs used in treatment may produce side effects.  EXERCISES  RANGE OF MOTION (ROM) AND STRETCHING EXERCISES - Achilles Tendinitis   These exercises may help you when beginning to rehabilitate your injury. Your symptoms may resolve with or without further involvement from your physician, physical therapist or athletic trainer. While completing these exercises, remember:   · Restoring tissue flexibility helps normal motion to return to the joints. This allows healthier, less painful movement and activity.  · An effective stretch should be held for at least 30 seconds.  · A stretch should never be painful. You should only feel a gentle lengthening or release in the stretched tissue.  STRETCH - Gastroc, Standing   · Place hands on wall.  · Extend right / left leg, keeping the front knee somewhat bent.  · Slightly point your toes inward on your back foot.  · Keeping your right / left heel on the floor and your knee straight, shift your weight toward the wall, not allowing your back to arch.  · You should feel a gentle stretch in the right / left calf. Hold this position for __________ seconds.  Repeat __________ times. Complete this stretch __________ times per day.  STRETCH - Soleus, Standing   · Place hands on wall.  · Extend right / left leg, keeping the other knee somewhat bent.  · Slightly point your toes inward on your back foot.  · Keep your right / left heel on the floor, bend your back knee, and slightly shift your weight over the back leg so that you feel a gentle stretch deep in your back calf.  · Hold this position for __________ seconds.  Repeat __________ times. Complete this stretch __________ times per day.  STRETCH -  Gastrocsoleus, Standing   Note: This exercise can place a lot of stress on your foot and ankle. Please complete this exercise only if specifically instructed by your caregiver.   · Place the ball of your right / left foot on a step, keeping your other foot firmly on the same step.  · Hold on to the wall or a rail for balance.  · Slowly lift your other foot, allowing your body weight to press your heel down over the edge of the step.  · You should feel a stretch in your right / left calf.  · Hold this position for __________ seconds.  · Repeat this exercise with a slight bend in your knee.  Repeat __________ times. Complete this stretch __________ times per day.   STRENGTHENING EXERCISES - Achilles Tendinitis  These exercises may help you when beginning to rehabilitate your injury. They may resolve your symptoms with or without further involvement from your physician, physical therapist or athletic trainer. While completing these exercises, remember:   · Muscles can gain both the endurance   and the strength needed for everyday activities through controlled exercises.  · Complete these exercises as instructed by your physician, physical therapist or athletic trainer. Progress the resistance and repetitions only as guided.  · You may experience muscle soreness or fatigue, but the pain or discomfort you are trying to eliminate should never worsen during these exercises. If this pain does worsen, stop and make certain you are following the directions exactly. If the pain is still present after adjustments, discontinue the exercise until you can discuss the trouble with your clinician.  STRENGTH - Plantar-flexors   · Sit with your right / left leg extended. Holding onto both ends of a rubber exercise band/tubing, loop it around the ball of your foot. Keep a slight tension in the band.  · Slowly push your toes away from you, pointing them downward.  · Hold this position for __________ seconds. Return slowly, controlling the  tension in the band/tubing.  Repeat __________ times. Complete this exercise __________ times per day.   STRENGTH - Plantar-flexors   · Stand with your feet shoulder width apart. Steady yourself with a wall or table using as little support as needed.  · Keeping your weight evenly spread over the width of your feet, rise up on your toes.*  · Hold this position for __________ seconds.  Repeat __________ times. Complete this exercise __________ times per day.   *If this is too easy, shift your weight toward your right / left leg until you feel challenged. Ultimately, you may be asked to do this exercise with your right / left foot only.  STRENGTH - Plantar-flexors, Eccentric   Note: This exercise can place a lot of stress on your foot and ankle. Please complete this exercise only if specifically instructed by your caregiver.   · Place the balls of your feet on a step. With your hands, use only enough support from a wall or rail to keep your balance.  · Keep your knees straight and rise up on your toes.  · Slowly shift your weight entirely to your right / left toes and pick up your opposite foot. Gently and with controlled movement, lower your weight through your right / left foot so that your heel drops below the level of the step. You will feel a slight stretch in the back of your calf at the end position.  · Use the healthy leg to help rise up onto the balls of both feet, then lower weight only on the right / left leg again. Build up to 15 repetitions. Then progress to 3 consecutive sets of 15 repetitions.*  · After completing the above exercise, complete the same exercise with a slight knee bend (about 30 degrees). Again, build up to 15 repetitions. Then progress to 3 consecutive sets of 15 repetitions.*  Perform this exercise __________ times per day.   *When you easily complete 3 sets of 15, your physician, physical therapist or athletic trainer may advise you to add resistance by wearing a backpack filled with  additional weight.  STRENGTH - Plantar Flexors, Seated   · Sit on a chair that allows your feet to rest flat on the ground. If necessary, sit at the edge of the chair.  · Keeping your toes firmly on the ground, lift your right / left heel as far as you can without increasing any discomfort in your ankle.  Repeat __________ times. Complete this exercise __________ times a day.  *If instructed by your physician, physical therapist or athletic   trainer, you may add ____________________ of resistance by placing a weighted object on your right / left knee.  Document Released: 04/06/2005 Document Revised: 11/28/2011 Document Reviewed: 12/18/2008  [employer]® Patient Information ©2015 [employer]. This information is not intended to replace advice given to you by your health care provider. Make sure you discuss any questions you have with your health care provider.

## 2014-08-07 NOTE — Progress Notes (Signed)
Patient ID: Eileen Richards, female   DOB: 07/25/1961, 53 y.o.   MRN: [id number]  Subjective: 53 year old female returns the office today for follow-up evaluation of bilateral retrocalcaneal exostosis and insertional Achilles tendinitis. She states that she continues to have discomfort along these areas. She was performing stretching exercises as well as ice and using the heel lifts without much resolution of symptoms. t. 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 Denies any systemic complaints as fevers, chills, nausea, vomiting. She denies any acute changes since last appointment. No other complaints at this time.  Objective: AAO x3, NAD DP/PT pulses palpable bilaterally, CRT less than 3 seconds Protective sensation intact with Simms Weinstein monofilament, vibratory sensation intact, Achilles tendon reflex intact Prominent retrocalcaneal exostosis bilaterally. There is tenderness over the site on the insertion of the Achilles tendon. There is no pain along the course of the Achilles tendon bilaterally. There is no defect identified within the Achilles tendon and the Achilles tendon appears to be intact. There is no overlying edema, erythema, increase in warmth. There is equinus present bilaterally. No pain with lateral compression of the calcaneus or pain with vibratory sensation bilaterally. MMT 5/5, ROM WNL Nails hypertrophic, dystrophic, brittle, discolored 10 without any surrounding erythema or drainage. No open lesions. No calf pain with compression, swelling, warmth, erythema.  Assessment: 53 year old female with symptomatically retrocalcaneal exostosis and insertional Achilles tendinitis bilaterally.  Plan: -Treatment options were discussed including alternatives, risks, competitions. -At this time continue stretching exercises as well as ice to the area. -Prescribed compound cream which we'll email to the patient. She was provided  instructions on the medication. -Discussed night splints. She states that she'll purchase them elsewhere. -Discussed with the patient that as she continues his stretching exercises on a consistent basis if she has any increase in symptoms to decrease the activity. Discussed with the patient that she is at risk for tendon rupture due to the prolonged inflammation within the tendon. If she has any increase in symptoms to call the office. -Follow-up in one month. I meantime, call the office with any questions, concerns, change in symptoms.

## 2014-08-08 ENCOUNTER — Telehealth: Payer: Self-pay | Admitting: *Deleted

## 2014-08-08 NOTE — Telephone Encounter (Signed)
Prescription sent to [facility] for a Musculoskeletal Pain cream. Baclofen 2%, Cyclobenzaprine 2%, Diclofenac 3% and Lidocaine 2% Quantity: 180 gm tube, 2 refills, Diagnosis: Achilles Tendonitis, Allergies: NKDA

## 2014-08-18 ENCOUNTER — Telehealth: Payer: Self-pay | Admitting: Family Medicine

## 2014-08-18 NOTE — Telephone Encounter (Signed)
Caller name: [name] Relation to pt: self Call back number: [phone] Pharmacy:  Reason for call:   Patient would like a callback from [facility]. She would not go into detail as to why she is calling. She would not discuss with me. She did say that it is not urgent.

## 2014-08-19 NOTE — Telephone Encounter (Signed)
Pt states [employer] is going to send paperwork for her FMLA

## 2014-08-20 NOTE — Telephone Encounter (Signed)
Paperwork received, pt needs hospital f/u appt to have these filled out. Message forwarded to RN's to schedule. JG//CMA

## 2014-09-01 NOTE — Telephone Encounter (Signed)
Pt has appt with Dr. Charlett Blake on 09/02/14 to have FMLA paperwork filled out. JG//CMA

## 2014-09-02 ENCOUNTER — Ambulatory Visit: Payer: Self-pay | Admitting: Family Medicine

## 2014-09-09 ENCOUNTER — Encounter: Payer: Self-pay | Admitting: Family Medicine

## 2014-09-09 ENCOUNTER — Ambulatory Visit (INDEPENDENT_AMBULATORY_CARE_PROVIDER_SITE_OTHER): Payer: 59 | Admitting: Family Medicine

## 2014-09-09 VITALS — BP 127/76 | HR 73 | Temp 98.6°F | Ht 64.0 in | Wt 241.0 lb

## 2014-09-09 DIAGNOSIS — E1169 Type 2 diabetes mellitus with other specified complication: Secondary | ICD-10-CM

## 2014-09-09 DIAGNOSIS — I10 Essential (primary) hypertension: Secondary | ICD-10-CM

## 2014-09-09 DIAGNOSIS — E119 Type 2 diabetes mellitus without complications: Secondary | ICD-10-CM

## 2014-09-09 DIAGNOSIS — E669 Obesity, unspecified: Secondary | ICD-10-CM

## 2014-09-09 DIAGNOSIS — K219 Gastro-esophageal reflux disease without esophagitis: Secondary | ICD-10-CM

## 2014-09-09 MED ORDER — ALBUTEROL SULFATE HFA 108 (90 BASE) MCG/ACT IN AERS: 2.0000 | INHALATION_SPRAY | Freq: Four times a day (QID) | RESPIRATORY_TRACT | Status: DC | PRN

## 2014-09-09 NOTE — Progress Notes (Signed)
Pre visit review using our clinic review tool, if applicable. No additional management support is needed unless otherwise documented below in the visit note. 

## 2014-09-09 NOTE — Patient Instructions (Addendum)
Sugars roughly 90 to 130 before eating and 100 to 150 after eating   Basic Carbohydrate Counting for Diabetes Mellitus Carbohydrate counting is a method for keeping track of the amount of carbohydrates you eat. Eating carbohydrates naturally increases the level of sugar (glucose) in your blood, so it is important for you to know the amount that is okay for you to have in every meal. Carbohydrate counting helps keep the level of glucose in your blood within normal limits. The amount of carbohydrates allowed is different for every person. A dietitian can help you calculate the amount that is right for you. Once you know the amount of carbohydrates you can have, you can count the carbohydrates in the foods you want to eat. Carbohydrates are found in the following foods:  Grains, such as breads and cereals.  Dried beans and soy products.  Starchy vegetables, such as potatoes, peas, and corn.  Fruit and fruit juices.  Milk and yogurt.  Sweets and snack foods, such as cake, cookies, candy, chips, soft drinks, and fruit drinks. CARBOHYDRATE COUNTING There are two ways to count the carbohydrates in your food. You can use either of the methods or a combination of both. Reading the "Nutrition Facts" on [place] The "Nutrition Facts" is an area that is included on the labels of almost all packaged food and beverages in the [place]. It includes the serving size of that food or beverage and information about the nutrients in each serving of the food, including the grams (g) of carbohydrate per serving.  Decide the number of servings of this food or beverage that you will be able to eat or drink. Multiply that number of servings by the number of grams of carbohydrate that is listed on the label for that serving. The total will be the amount of carbohydrates you will be having when you eat or drink this food or beverage. Learning Standard Serving Sizes of Food When you eat food that is not  packaged or does not include "Nutrition Facts" on the label, you need to measure the servings in order to count the amount of carbohydrates.A serving of most carbohydrate-rich foods contains about 15 g of carbohydrates. The following list includes serving sizes of carbohydrate-rich foods that provide 15 g ofcarbohydrate per serving:   1 slice of bread (1 oz) or 1 six-inch tortilla.    of a hamburger bun or English muffin.  4-6 crackers.   cup unsweetened dry cereal.    cup hot cereal.   cup rice or pasta.    cup mashed potatoes or  of a large baked potato.  1 cup fresh fruit or one small piece of fruit.    cup canned or frozen fruit or fruit juice.  1 cup milk.   cup plain fat-free yogurt or yogurt sweetened with artificial sweeteners.   cup cooked dried beans or starchy vegetable, such as peas, corn, or potatoes.  Decide the number of standard-size servings that you will eat. Multiply that number of servings by 15 (the grams of carbohydrates in that serving). For example, if you eat 2 cups of strawberries, you will have eaten 2 servings and 30 g of carbohydrates (2 servings x 15 g = 30 g). For foods such as soups and casseroles, in which more than one food is mixed in, you will need to count the carbohydrates in each food that is included. EXAMPLE OF CARBOHYDRATE COUNTING Sample Dinner  3 oz chicken breast.   cup of brown rice.  cup of corn.  1 cup milk.   1 cup strawberries with sugar-free whipped topping.  Carbohydrate Calculation Step 1: Identify the foods that contain carbohydrates:   Rice.   Corn.   Milk.   Strawberries. Step 2:Calculate the number of servings eaten of each:   2 servings of rice.   1 serving of corn.   1 serving of milk.   1 serving of strawberries. Step 3: Multiply each of those number of servings by 15 g:   2 servings of rice x 15 g = 30 g.   1 serving of corn x 15 g = 15 g.   1 serving of milk x 15  g = 15 g.   1 serving of strawberries x 15 g = 15 g. Step 4: Add together all of the amounts to find the total grams of carbohydrates eaten: 30 g + 15 g + 15 g + 15 g = 75 g. Document Released: 09/05/2005 Document Revised: 01/20/2014 Document Reviewed: 08/02/2013 [facility] Patient Information 2015 [place], [place]. This information is not intended to replace advice given to you by your health care provider. Make sure you discuss any questions you have with your health care provider.

## 2014-09-14 NOTE — Progress Notes (Signed)
Eileen Richards  [id number] 1960-11-05 09/14/2014      Progress Note-Follow Up  Subjective  Chief Complaint  Chief Complaint  Patient presents with  . Follow-up    HPI  Patient is a 53 y.o. female in today for routine medical care. Feeling well. Had headache yesterday but does not have them daily denies polyuria or polydipsia. No recent illness. Denies CP/palp/SOB/HA/congestion/fevers/GI or GU c/o. Taking meds as prescribed  Past Medical History  Diagnosis Date  . Diabetes mellitus   . Hypertension   . Hyperlipemia   . Atypical chest pain     normal stress echo 02/2009 -LV size was normal LV global systolic function was normal- Normal wall motion; no LV regional wall motion abnormalities  . Asthma     childhood  . GERD (gastroesophageal reflux disease)   . Pain of right heel 10/14/2012  . Achilles tendonitis 10/14/2012  . Chest pain, atypical 08/24/2013  . Onychomycosis 04/17/2014    Past Surgical History  Procedure Laterality Date  . Abdominal hysterectomy    . Dilation and curettage of uterus      Lysis of Adhesions  . Robotic assisted salpingo oopherectomy  07/23/2012    Procedure: ROBOTIC ASSISTED SALPINGO OOPHERECTOMY;  Surgeon: Marvene Staff, MD;  Location: [facility];  Service: Gynecology;  Laterality: Bilateral;  . Robotic assisted laparoscopic lysis of adhesion  07/23/2012    Procedure: ROBOTIC ASSISTED LAPAROSCOPIC LYSIS OF ADHESION;  Surgeon: Marvene Staff, MD;  Location: [facility];  Service: Gynecology;  Laterality: N/A;  . Cystoscopy  07/23/2012    Procedure: CYSTOSCOPY;  Surgeon: Marvene Staff, MD;  Location: [facility];  Service: Gynecology;  Laterality: N/A;    Family History  Problem Relation Age of Onset  . GER disease Mother   . GER disease Father   . Heart disease Father     father has "heart problems"    History   Social History  . Marital Status: Single    Spouse Name: N/A    Number of Children: N/A  . Years of Education: N/A    Occupational History  . Not on file.   Social History Main Topics  . Smoking status: Former Research scientist (life sciences)  . Smokeless tobacco: Never Used  . Alcohol Use: No  . Drug Use: No  . Sexual Activity: Not on file   Other Topics Concern  . Not on file   Social History Narrative    Current Outpatient Prescriptions on File Prior to Visit  Medication Sig Dispense Refill  . ALPRAZolam (XANAX) 0.25 MG tablet Take 1 tablet (0.25 mg total) by mouth 2 (two) times daily as needed for anxiety (palpitations). 20 tablet 0  . aspirin 81 MG tablet Take 81 mg by mouth daily.    . Blood Glucose Monitoring Suppl (TRUETRACK BLOOD GLUCOSE) W/DEVICE KIT by Does not apply route.      . carvedilol (COREG) 3.125 MG tablet TAKE 1 TABLET BY MOUTH 2 TIMES DAILY WITH A MEAL 60 tablet 3  . Colesevelam HCl [facility]) 3.75 G PACK Take 1 packet by mouth daily. 30 each 2  . Efinaconazole 10 % SOLN Apply 1 drop topically daily. 4 mL 11  . glimepiride (AMARYL) 4 MG tablet TAKE 1 TABLET BY MOUTH TWICE DAILY 60 tablet 11  . hydrochlorothiazide (HYDRODIURIL) 25 MG tablet TAKE 1 TABLET BY MOUTH DAILY. 90 tablet 3  . INVOKANA 100 MG TABS tablet TAKE 1 TABLET BY MOUTH DAILY. 30 tablet 2  . JANUVIA 100 MG tablet  TAKE 1 TABLET BY MOUTH DAILY. 30 tablet 2  . Lancet Device MISC by Does not apply route. Use to test blood sugar three times daily     . metFORMIN (GLUCOPHAGE-XR) 500 MG 24 hr tablet TAKE 2 TABLETS BY MOUTH TWICE DAILY AND 1 TABLET AT NOON 150 tablet 3  . Multiple Vitamin (MULTIVITAMIN) tablet Take 1 tablet by mouth daily.      . nitroGLYCERIN (NITROSTAT) 0.4 MG SL tablet Place 1 tablet (0.4 mg total) under the tongue every 5 (five) minutes as needed for chest pain. 25 tablet 3  . pantoprazole (PROTONIX) 40 MG tablet TAKE 1 TABLET BY MOUTH DAILY 30 tablet 2  . simvastatin (ZOCOR) 20 MG tablet TAKE 1 TABLET BY MOUTH AT BEDTIME. 90 tablet 5  . Tavaborole (KERYDIN) 5 % SOLN Apply 1 drop topically 1 day or 1 dose. Apply 1 drop to  the toenail daily. 1 Bottle 24  . traMADol (ULTRAM) 50 MG tablet Take 2 tablets (100 mg total) by mouth 2 (two) times daily as needed. 60 tablet 2  . TRUETEST TEST test strip USE TO TEST BLOOD SUGAR THREE TIMES A DAY 100 each 3   No current facility-administered medications on file prior to visit.    No Known Allergies  Review of Systems  Review of Systems  Constitutional: Negative for fever and malaise/fatigue.  HENT: Negative for congestion.   Eyes: Negative for discharge.  Respiratory: Negative for shortness of breath.   Cardiovascular: Negative for chest pain, palpitations and leg swelling.  Gastrointestinal: Negative for nausea, abdominal pain and diarrhea.  Genitourinary: Negative for dysuria.  Musculoskeletal: Negative for falls.  Skin: Negative for rash.  Neurological: Positive for headaches. Negative for loss of consciousness.  Endo/Heme/Allergies: Negative for polydipsia.  Psychiatric/Behavioral: Negative for depression and suicidal ideas. The patient is not nervous/anxious and does not have insomnia.     Objective  BP 127/76 mmHg  Pulse 73  Temp(Src) 98.6 F (37 C) (Oral)  Ht _0  (1.626 m)  Wt 241 lb (109.317 kg)  BMI 41.35 kg/m2  SpO2 99%  Physical Exam  Physical Exam  Constitutional: She is oriented to person, place, and time and well-developed, well-nourished, and in no distress. No distress.  HENT:  Head: Normocephalic and atraumatic.  Eyes: Conjunctivae are normal.  Neck: Neck supple. No thyromegaly present.  Cardiovascular: Normal rate, regular rhythm and normal heart sounds.   No murmur heard. Pulmonary/Chest: Effort normal and breath sounds normal. She has no wheezes.  Abdominal: She exhibits no distension and no mass.  Musculoskeletal: She exhibits no edema.  Lymphadenopathy:    She has no cervical adenopathy.  Neurological: She is alert and oriented to person, place, and time.  Skin: Skin is warm and dry. No rash noted. She is not  diaphoretic.  Psychiatric: Memory, affect and judgment normal.    Lab Results  Component Value Date   TSH 1.22 07/31/2014   Lab Results  Component Value Date   WBC 10.4 07/31/2014   HGB 14.3 07/31/2014   HCT 44.9 07/31/2014   MCV 91.8 07/31/2014   PLT 420.0* 07/31/2014   Lab Results  Component Value Date   CREATININE 0.8 07/31/2014   BUN 18 07/31/2014   NA 142 07/31/2014   K 3.8 07/31/2014   CL 105 07/31/2014   CO2 23 07/31/2014   Lab Results  Component Value Date   ALT 19 07/31/2014   AST 18 07/31/2014   ALKPHOS 71 07/31/2014   BILITOT 0.5 07/31/2014  Lab Results  Component Value Date   CHOL 161 07/31/2014   Lab Results  Component Value Date   HDL 31.70* 07/31/2014   Lab Results  Component Value Date   LDLCALC 111* 07/31/2014   Lab Results  Component Value Date   TRIG 92.0 07/31/2014   Lab Results  Component Value Date   CHOLHDL 5 07/31/2014     Assessment & Plan  Essential hypertension Well controlled, no changes to meds. Encouraged heart healthy diet such as the DASH diet and exercise as tolerated.   GERD (gastroesophageal reflux disease) Avoid offending foods, start probiotics. Do not eat large meals in late evening and consider raising head of bed.   Diabetes mellitus type 2 in obese hgba1c acceptable, minimize simple carbs. Increase exercise as tolerated. Continue current med, A1C mildly elevated encouraged smaller, more frequent meals with lean proteins  Obesity Encouraged DASH diet, decrease po intake and increase exercise as tolerated. Needs 7-8 hours of sleep nightly. Avoid trans fats, eat small, frequent meals every 4-5 hours with lean proteins, complex carbs and healthy fats. Minimize simple carbs

## 2014-09-14 NOTE — Assessment & Plan Note (Signed)
Well controlled, no changes to meds. Encouraged heart healthy diet such as the DASH diet and exercise as tolerated.  °

## 2014-09-14 NOTE — Assessment & Plan Note (Addendum)
Encouraged DASH diet, decrease po intake and increase exercise as tolerated. Needs 7-8 hours of sleep nightly. Avoid trans fats, eat small, frequent meals every 4-5 hours with lean proteins, complex carbs and healthy fats. Minimize simple carbs 

## 2014-09-14 NOTE — Assessment & Plan Note (Signed)
hgba1c acceptable, minimize simple carbs. Increase exercise as tolerated. Continue current med, A1C mildly elevated encouraged smaller, more frequent meals with lean proteins

## 2014-09-14 NOTE — Assessment & Plan Note (Signed)
Avoid offending foods, start probiotics. Do not eat large meals in late evening and consider raising head of bed.  

## 2014-09-16 DIAGNOSIS — Z7689 Persons encountering health services in other specified circumstances: Secondary | ICD-10-CM

## 2014-09-16 NOTE — Telephone Encounter (Signed)
Pt is following up on her FMLA paperwork, pt saw dr. Charlett Blake on 09/09/14. Wants to know if the paperwork is ready yet.

## 2014-09-17 ENCOUNTER — Telehealth: Payer: Self-pay | Admitting: *Deleted

## 2014-09-17 NOTE — Telephone Encounter (Signed)
Received FMLA paperwork via email from [employer]. Forms filled out and forwarded to Dr. Charlett Blake. JG//CMA

## 2014-09-17 NOTE — Telephone Encounter (Signed)
Chaya Jan, CMA at 09/17/2014 7:47 AM     Status: Signed       Expand All Collapse All   Received FMLA paperwork via email from [employer]. Forms filled out and forwarded to Dr. Charlett Blake. JG//CMA     Per above note forms have been placed in Dr. Frederik Pear folder for review and signature.

## 2014-09-17 NOTE — Telephone Encounter (Signed)
I do not have any forms. I am happy to fill out a form for her but do not believe I have seen one. Please confirm there is not one sitting a the office. If there is put it on my desk and I will do first thing in am if not then we need to get another copy and I will do. 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

## 2014-09-22 NOTE — Telephone Encounter (Signed)
Chaya Jan, CMA at 09/22/2014 3:54 PM     Status: Signed       Expand All Collapse All   Completed paperwork faxed to [employer] at [phone]. JG//CMA

## 2014-09-22 NOTE — Telephone Encounter (Signed)
Completed paperwork faxed to [employer] at [phone]. JG//CMA

## 2014-09-25 ENCOUNTER — Ambulatory Visit: Payer: Self-pay | Admitting: Dietician

## 2014-10-02 ENCOUNTER — Encounter: Payer: 59 | Attending: Family Medicine | Admitting: Dietician

## 2014-10-02 VITALS — Ht 64.0 in | Wt 237.9 lb

## 2014-10-02 DIAGNOSIS — E1169 Type 2 diabetes mellitus with other specified complication: Secondary | ICD-10-CM

## 2014-10-02 DIAGNOSIS — E119 Type 2 diabetes mellitus without complications: Secondary | ICD-10-CM | POA: Insufficient documentation

## 2014-10-02 DIAGNOSIS — E669 Obesity, unspecified: Secondary | ICD-10-CM | POA: Insufficient documentation

## 2014-10-02 DIAGNOSIS — Z6841 Body Mass Index (BMI) 40.0 and over, adult: Secondary | ICD-10-CM | POA: Diagnosis not present

## 2014-10-02 NOTE — Progress Notes (Signed)
Appt Time: 1430 to 1500  Patient is here for follow-up for diabetes and weight management.  Since our last visit her HgA1c score has dropped from 9.9% to 7.8%.  She also weighs 7 lbs lighter than she did 3 months ago.  She reports she is still eating out a lot, about 4-5 times a week.  She is eating about 2 meals a day mostly due to her schedule with her two jobs.  She walks a lot for her job at [employer].  She also takes the stairs at work.  Not eating bread as much.  Still skipping breakfast.  She is sticking to her goal of fried foods 2 times a week normally on Sunday and one time within the week.  Drinking lots of water.  Not buying sodas at home, only carbonated flavored water.  Drinking sweet tea occasionally when out to eat.  She is still checking BG when she feels funny.  Only drinking coffee with creamer or black, no sugar.   Patient also reports cooking ahead (vegetable soup in crock pot) occasionally and eating on this throughout the week.  She states she still has goals for weight loss and wants to continue to learn how to eat healthier.  Intervention:  Nutrition education and counseling.  Praised patient for the changes she has made and for making her health a priority.  Reviewed concepts covered at last visit, including carbohydrate foods and balanced meals using the portion plate/ Myplate.  Encouraged her to stay active and find ways to increase her activity level such as buying a pedometer and setting a step goal above what she typically reaches now.  Discussed importance of regular timed meals for BG control.  Patient states she will try to begin eating breakfast more often.  Reviewed labs with patient.  Discussed healthy fats to increase good cholesterol (HDL) and brainstormed ways to decrease bad cholesterol (LDL) such as sticking to her fried food goal and limiting going out to eat.  Encouraged patient to continue cooking ahead meals to have throughout the week on her busy work days.   Reviewed portion recommendations and patient states she has made an effort to reduce portions by taking leftovers home instead of cleaning her plate when out to eat.  Together we reviewed her existing goals:   Continued Goals:  Follow Diabetes Meal Plan as instructed  Eat 3 meals every day, try not to go longer than 4-5 hrs without eating  Eat breakfast every day, even if it is something small  Limit carbohydrate intake to 2-3 servings carbohydrate per meal  Add lean protein foods to meals and snacks  Limit fried foods to 1-2 times each week  Reduce intake of sugary beverages like soda and sweet tea  Stay active! Monitor glucose levels as instructed by your doctor  Materials given: Nutrition strategies for weight loss with diabetes, MyPlate  Monitor/Evalaute: Diet, weight, and exercise at follow-up prn.

## 2014-10-14 ENCOUNTER — Other Ambulatory Visit: Payer: Self-pay | Admitting: Family Medicine

## 2014-10-14 NOTE — Telephone Encounter (Signed)
Rx request to pharmacy/[facility]  

## 2014-10-16 LAB — HM DIABETES EYE EXAM

## 2014-10-27 ENCOUNTER — Ambulatory Visit (INDEPENDENT_AMBULATORY_CARE_PROVIDER_SITE_OTHER): Payer: 59 | Admitting: Family Medicine

## 2014-10-27 ENCOUNTER — Ambulatory Visit (HOSPITAL_BASED_OUTPATIENT_CLINIC_OR_DEPARTMENT_OTHER)
Admission: RE | Admit: 2014-10-27 | Discharge: 2014-10-27 | Disposition: A | Discharge status: Home / Self Care | Payer: 59 | Source: Ambulatory Visit | Attending: Family Medicine | Admitting: Family Medicine

## 2014-10-27 ENCOUNTER — Encounter: Payer: Self-pay | Admitting: Family Medicine

## 2014-10-27 ENCOUNTER — Ambulatory Visit (INDEPENDENT_AMBULATORY_CARE_PROVIDER_SITE_OTHER): Payer: Self-pay | Admitting: Family Medicine

## 2014-10-27 VITALS — BP 132/72 | HR 97 | Temp 98.3°F | Ht 64.0 in | Wt 239.5 lb

## 2014-10-27 VITALS — BP 128/76 | Wt 236.0 lb

## 2014-10-27 DIAGNOSIS — M47892 Other spondylosis, cervical region: Secondary | ICD-10-CM | POA: Insufficient documentation

## 2014-10-27 DIAGNOSIS — I10 Essential (primary) hypertension: Secondary | ICD-10-CM

## 2014-10-27 DIAGNOSIS — R202 Paresthesia of skin: Secondary | ICD-10-CM

## 2014-10-27 DIAGNOSIS — M542 Cervicalgia: Secondary | ICD-10-CM

## 2014-10-27 DIAGNOSIS — E669 Obesity, unspecified: Secondary | ICD-10-CM

## 2014-10-27 DIAGNOSIS — E1169 Type 2 diabetes mellitus with other specified complication: Secondary | ICD-10-CM

## 2014-10-27 DIAGNOSIS — E119 Type 2 diabetes mellitus without complications: Secondary | ICD-10-CM

## 2014-10-27 DIAGNOSIS — G540 Brachial plexus disorders: Secondary | ICD-10-CM

## 2014-10-27 MED ORDER — CYCLOBENZAPRINE HCL 10 MG PO TABS: 10.0000 mg | ORAL_TABLET | Freq: Every evening | ORAL | Status: DC | PRN

## 2014-10-27 MED ORDER — MELOXICAM 15 MG PO TABS: 15.0000 mg | ORAL_TABLET | Freq: Every day | ORAL | Status: DC | PRN

## 2014-10-27 NOTE — Patient Instructions (Signed)
Thoracic Outlet Syndrome Thoracic outlet syndrome is a condition caused by the compressing of the nerves and blood vessels between the muscles of the neck and the shoulders. Work activities involving prolonged restricted postures such as carrying heavy shoulder loads, pulling shoulders back and down, or reaching above shoulder level for prolonged periods of time can cause the inflammation (soreness) and swelling of tendons (cord like structures which attach muscle to bone) and muscles in the shoulders and upper arms. When swollen or inflamed, they can compress the nerves and blood vessels between the neck and shoulders.  SYMPTOMS   Pain.  Arm weakness.  Numbness in the arm and fingers.  Sense of touch or the ability to feel heat and cold may be lost. DIAGNOSIS  The diagnosis is made by medical history and physical examination. Special tests can confirm the diagnosis.  TREATMENT   A carefully planned program of exercise therapy.  Applying ice packs for 15-20 minutes, 03-04 times per day, may be useful.  Avoid work activities suspected of causing the condition.  Only take over-the-counter or prescription medicines for pain, discomfort, or fever as directed by your caregiver.  Surgery may be necessary if symptoms persist. PREVENTION   Avoid carrying heavy weights.  Avoid reaching overhead.  Avoid lifting with the arms above shoulder level. SEEK IMMEDIATE MEDICAL CARE IF:   You have an increase in swelling or pain in your arm.  You notice coldness in your fingers.  Pain relief is not obtained with medications. Document Released: 08/26/2002 Document Revised: 11/28/2011 Document Reviewed: 09/05/2005 [facility] Patient Information 2015 [place], [place]. This information is not intended to replace advice given to you by your health care provider. Make sure you discuss any questions you have with your health care provider.

## 2014-10-27 NOTE — Assessment & Plan Note (Signed)
Subjective:  Patient presents today for annual pharmacy visit and diabetes follow-up as part of the employer-sponsored Link to Wellness program. Current diabetes regimen includes metformin, Januvia, Invokana, Welchol, and glimepiride. Patient also continues on daily ASA and statin. Most recent MD follow-up was October 27, 2014. Patient has a pending appt for March 2016. New medications include meloxicam and cyclobenzaprine.   Patient reports adherence to DM medications, although sometimes she forgets to take her metformin at lunch. Her BGs have ranged from the 70s to the low 200s (once or twice in the mid afternoon). She checks her BGs sporadically depending on how she is feeling--sometimes once a day, sometimes 3-4 times a day if she isn't feeling well. Over the past few months, she has felt low 3-4x. She states that she ate a graham cracker to bring her sugar back up, rechecked in about 20 minutes later, and her sugar had come up.   Disease Assessments:  Diabetes: Type of Diabetes: Type 2; Sees Diabetes provider 3 times per year; checks feet daily; takes an aspirin a day; Current Diabetes related medical conditions are High blood pressure, High cholesterol; Year of diagnosis 2009; uses glucometer True Result; hypoglycemia frequency 0; MD managing Diabetes Penni Homans; What is target A1c? <7.0 %; does not take medications as prescribed often forgets her noon dose of Metformin, has not been taking her Welchol; checks blood glucose 2-6 times a week;   Other Diabetes History:  Did not bring her meter to today's visit. Reports low of BG in 70s, high in low 200s (mid afternoon). Checks BG 2-5 times per week.   Past Medical/Surgical History:  The patient has a past medical history of Diabetes Mellitus type 2, Hypertension, Gastroesophageal Reflux.   Hospitalizations: Denies ED visits in the past year; Denies Hospital visits in the past year   Tobacco Assessment: Smoking Status: Former smoker; Last  Reviewed: 10/27/2014   Social History:  Alcohol use:; Caffeine use:; No drug use; Medication adherence adherent; Diet adherence 50-75% of the time; Patient can afford medications; Patient knows the purpose/use of medications; Pharmacy assist consult was not needed; Pharmacist consult was not done; Exercise adherence Never Feels like she exercises all day long--works at [employer] and walks around the hospital all day. Works another job where she walks a lot as well.  .  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 Will bring a piece of fruit with her sometimes (apple or almonds). Eats soup in between jobs. Drinks water, diet Lipton tea, sometimes has a regular soda.   Preventive Care:    Hemoglobin A1c: 10/07/2014 POC with Marcie Bal 7.5                Overall Health Assessments:  Vision:  Dilated Eye Exam: 10/20/2014   Vital Signs:  10/27/2014 4:06 PM (EST)Blood Pressure 128 / 76 mm/HgBMI 40.5; Height 5 ft 4 in; Weight 236 lbs   Care Planning:  Learning Preference Assessment:  Learner: Patient  Readiness to Learn Barriers: None  Teaching Method: Explanation  Evaluation of Learning: Needs review and assistance  Readiness to Change:  How important is your health to you? 10  How confident are you in working to improve your health? 8  How ready are you to change to improve your health? 8  Total Score: 9  Care Plan:  10/27/2014 4:08 PM (EST) (2)  Problem: No recent dental exam  Role: Clinical Pharmacist  Long  Term Goal Find a dentist and make appointment  Date Started: 10/27/2014  10/27/2014 3:34 PM (EST) (1)  Problem: Type II DM not meeting A1C target as evidenced by A1C= 7.5%  Role: Care Management Coordinator  Long Term Goal : Improved glycemic control as evidenced by A1C <7.5% at next Link To Wellness meeting  Interventions:  Reviewed the 8 core pathophysiologic deficits in Type II  diabetes. Discussed physiology of diabetes as a chronic progressive disease with increased loss of beta cell function over time and the problem of insulin resistance in the muscle and liver cells.  Reviewed patient medications. Discussed DM medications of Metformin, Invokana, glimepiride and Januvia including the mechanism of action, common side effects, dosages and dosing schedule. Reinforced importance of taking all medications as prescribed including her lunchtime Metformin and Welchol  Discussed the need for the use of a combination of DM medications to correct the pathophysiologic core deficits and to prevent or slow beta cell failure  Discussed role of statins, aspirin, and blood pressure medicines in the treatment of diabetes. Discussed mechanism of action of Welchol and that it can interfere with the absorption of some medications so best to take 4 hours apart from her other medications if possible.  Discussed role of obesity, especially central obesity, on insulin resistance. Congratulated Demi on her 10 lb weight loss in last 6 months  Positive reinforcement given to Levada Dy for using the nutritional counseling benefit provided by [facility] plan to see a dietician to assist her with dietary management of diabetes.  Discussed effects of physical activity on glucose levels and long-term glucose control by improving insulin sensitivity and assisting with weight management and cardiovascular health. Encouraged her to consider purchasing a pedometer to track her steps with long term goal of 10,000 steps per day.  Discussed results of today's POC A1C, A1C goal, and correlation to estimated average glucose.  Reviewed results of all blood work drawn on 07/22/15, Discussed strategies to raise HDL, lower LDL and A1C and raise albumin.  Assigned Emmi education modules related to exercise and diabetes and exercise and weight loss. Encouraged patient to view by completion date and to incorporate  information gained through the modules to assist with Type II DM self -management  Reviewed upcoming appointments with patient's primary care MD and scheduled annual visit with pharmacist on 10/27/14. Reinforced the importance of keeping the appointments. Encouraged patient to write questions/concerns in advance to discuss with health team member.  Congratulated patient on her participation in the [place]. Reviewed badges earned. Congratulated [place] on completing all badge categories.  Review the brochure that was given to you and call to see if you are eligible to participate in the [employer] app study. A phone app that is designed to help you manage your Type II DM.  Will arrange for Link To Wellness follow up with this RNCM in April. Date Started: 04/07/2014  No Goal Met      Assessment/Plan: Patient is a 54 yo female with type 2 diabetes currently moderately controlled on oral therapy of metformin, Invokana, Januvia, glimepiride, and Welchol. Goal A1c <7% with most recent A1c of 7.5 in January 2016 (down from 7.8 in December 2015). Patient reports adherence to her medications and checks her blood sugar a few times a week. She reports her lowest BG reading in the 70s and a high in the 200s (mid-afternoon). We discussed proper management of hypoglycemia at today's visit.   Diet and  exercise were discussed as well. Patient reports walking all day at her two jobs, and she is working on eating smaller more frequent meals wile limiting soda intake. Congratulated patient on continued smoking cessation (last smoked a cigarette 8 years ago).   Patient was encouraged to check her blood sugars more frequently (about once a day) and to continue taking her medications as directed. She will also work to schedule a dentist appointment in the near future. Patient will follow up with Kelli Churn, RNCM, CDE. Patient seen by Fuller Canada, PharmD resident    Goals for  next visit: 1) Make an appointment to see a dentist 2) Take Welchol at lunch every day 3) Try to check blood sugar every morning before eating

## 2014-10-27 NOTE — Progress Notes (Signed)
Pre visit review using our clinic review tool, if applicable. No additional management support is needed unless otherwise documented below in the visit note. 

## 2014-10-29 ENCOUNTER — Ambulatory Visit (INDEPENDENT_AMBULATORY_CARE_PROVIDER_SITE_OTHER): Payer: 59 | Admitting: Podiatry

## 2014-10-29 ENCOUNTER — Encounter: Payer: Self-pay | Admitting: Podiatry

## 2014-10-29 VITALS — BP 117/64 | HR 87 | Resp 18

## 2014-10-29 DIAGNOSIS — M7662 Achilles tendinitis, left leg: Secondary | ICD-10-CM

## 2014-10-29 DIAGNOSIS — M7730 Calcaneal spur, unspecified foot: Secondary | ICD-10-CM

## 2014-10-29 DIAGNOSIS — M7661 Achilles tendinitis, right leg: Secondary | ICD-10-CM

## 2014-10-29 NOTE — Patient Instructions (Signed)
Follow up with physical therapy

## 2014-11-03 ENCOUNTER — Encounter: Payer: Self-pay | Admitting: Family Medicine

## 2014-11-03 ENCOUNTER — Other Ambulatory Visit: Payer: Self-pay | Admitting: Family Medicine

## 2014-11-03 DIAGNOSIS — G540 Brachial plexus disorders: Secondary | ICD-10-CM

## 2014-11-03 HISTORY — DX: Brachial plexus disorders: G54.0

## 2014-11-03 NOTE — Assessment & Plan Note (Signed)
hgba1c mildly elevated with last check, minimize simple carbs. Increase exercise as tolerated. Continue current meds. Home numbers improving.

## 2014-11-03 NOTE — Progress Notes (Signed)
Eileen Richards  [id number] [birth year] 11/03/2014      Progress Note-Follow Up  Subjective  Chief Complaint  Chief Complaint  Patient presents with  . Arm Pain    HPI  Patient is a 54 y.o. female in today for routine medical care. Patient is in today complaining of some tingling intermittently in her right arm for about 10 days. She denies any injury. She technologist stiffness in her neck. The pain has become intense enough to wake her up. She has no actual numbness or debility in the arm. She notes her blood sugars are improving. She's had a low 75 and most numbers are in the 100s although she has seen some numbers in the 200s. Denies polyuria or polydipsia. Denies any recent illness.Denies CP/palp/SOB/HA/congestion/fevers/GI or GU c/o. Taking meds as prescribed  Past Medical History  Diagnosis Date  . Diabetes mellitus   . Hypertension   . Hyperlipemia   . Atypical chest pain     normal stress echo 02/2009 -LV size was normal LV global systolic function was normal- Normal wall motion; no LV regional wall motion abnormalities  . Asthma     childhood  . GERD (gastroesophageal reflux disease)   . Pain of right heel 10/14/2012  . Achilles tendonitis 10/14/2012  . Chest pain, atypical 08/24/2013  . Onychomycosis 04/17/2014  . Thoracic outlet syndrome 11/03/2014    Past Surgical History  Procedure Laterality Date  . Abdominal hysterectomy    . Dilation and curettage of uterus      Lysis of Adhesions  . Robotic assisted salpingo oopherectomy  07/23/2012    Procedure: ROBOTIC ASSISTED SALPINGO OOPHERECTOMY;  Surgeon: Marvene Staff, MD;  Location: [facility];  Service: Gynecology;  Laterality: Bilateral;  . Robotic assisted laparoscopic lysis of adhesion  07/23/2012    Procedure: ROBOTIC ASSISTED LAPAROSCOPIC LYSIS OF ADHESION;  Surgeon: Marvene Staff, MD;  Location: [facility];  Service: Gynecology;  Laterality: N/A;  . Cystoscopy  07/23/2012    Procedure: CYSTOSCOPY;   Surgeon: Marvene Staff, MD;  Location: [facility];  Service: Gynecology;  Laterality: N/A;    Family History  Problem Relation Age of Onset  . GER disease Mother   . GER disease Father   . Heart disease Father     father has "heart problems"    History   Social History  . Marital Status: Single    Spouse Name: N/A  . Number of Children: N/A  . Years of Education: N/A   Occupational History  . Not on file.   Social History Main Topics  . Smoking status: Former Research scientist (life sciences)  . Smokeless tobacco: Never Used  . Alcohol Use: No  . Drug Use: No  . Sexual Activity: Not on file   Other Topics Concern  . Not on file   Social History Narrative    Current Outpatient Prescriptions on File Prior to Visit  Medication Sig Dispense Refill  . albuterol (PROVENTIL HFA;VENTOLIN HFA) 108 (90 BASE) MCG/ACT inhaler Inhale 2 puffs into the lungs every 6 (six) hours as needed for wheezing or shortness of breath. 1 Inhaler 3  . ALPRAZolam (XANAX) 0.25 MG tablet Take 1 tablet (0.25 mg total) by mouth 2 (two) times daily as needed for anxiety (palpitations). (Patient not taking: Reported on 10/27/2014) 20 tablet 0  . aspirin 81 MG tablet Take 81 mg by mouth daily.    . Blood Glucose Monitoring Suppl (TRUETRACK BLOOD GLUCOSE) W/DEVICE KIT by Does not apply route.      Marland Kitchen  Colesevelam HCl [facility]) 3.75 G PACK Take 1 packet by mouth daily. 30 each 2  . Efinaconazole 10 % SOLN Apply 1 drop topically daily. 4 mL 11  . glimepiride (AMARYL) 4 MG tablet TAKE 1 TABLET BY MOUTH TWICE DAILY 60 tablet 11  . hydrochlorothiazide (HYDRODIURIL) 25 MG tablet TAKE 1 TABLET BY MOUTH DAILY. 90 tablet 1  . INVOKANA 100 MG TABS tablet TAKE 1 TABLET BY MOUTH DAILY. 30 tablet 2  . Lancet Device MISC by Does not apply route. Use to test blood sugar three times daily     . metFORMIN (GLUCOPHAGE-XR) 500 MG 24 hr tablet TAKE 2 TABLETS BY MOUTH TWICE DAILY AND 1 TABLET AT NOON 150 tablet 3  . Multiple Vitamin (MULTIVITAMIN)  tablet Take 1 tablet by mouth daily.      . nitroGLYCERIN (NITROSTAT) 0.4 MG SL tablet Place 1 tablet (0.4 mg total) under the tongue every 5 (five) minutes as needed for chest pain. 25 tablet 3  . pantoprazole (PROTONIX) 40 MG tablet TAKE 1 TABLET BY MOUTH DAILY 30 tablet 2  . simvastatin (ZOCOR) 20 MG tablet TAKE 1 TABLET BY MOUTH AT BEDTIME. 90 tablet 5  . Tavaborole (KERYDIN) 5 % SOLN Apply 1 drop topically 1 day or 1 dose. Apply 1 drop to the toenail daily. 1 Bottle 24  . traMADol (ULTRAM) 50 MG tablet Take 2 tablets (100 mg total) by mouth 2 (two) times daily as needed. 60 tablet 2  . TRUETEST TEST test strip USE TO TEST BLOOD SUGAR THREE TIMES A DAY 100 each 3   No current facility-administered medications on file prior to visit.    No Known Allergies  Review of Systems  Review of Systems  Constitutional: Positive for malaise/fatigue. Negative for fever.  HENT: Negative for congestion.   Eyes: Negative for discharge.  Respiratory: Negative for shortness of breath.   Cardiovascular: Negative for chest pain, palpitations and leg swelling.  Gastrointestinal: Negative for nausea, abdominal pain and diarrhea.  Genitourinary: Negative for dysuria.  Musculoskeletal: Positive for myalgias. Negative for falls.  Skin: Negative for rash.  Neurological: Positive for tingling. Negative for loss of consciousness and headaches.       Right arm with intermittent tingling  Endo/Heme/Allergies: Negative for polydipsia.  Psychiatric/Behavioral: Negative for depression and suicidal ideas. The patient is not nervous/anxious and does not have insomnia.     Objective  BP 132/72 mmHg  Pulse 97  Temp(Src) 98.3 F (36.8 C) (Oral)  Ht $R'5\' 4"'HR$  (1.626 m)  Wt 239 lb 8 oz (108.636 kg)  BMI 41.09 kg/m2  SpO2 99%  Physical Exam  Physical Exam  Constitutional: She is oriented to person, place, and time and well-developed, well-nourished, and in no distress. No distress.  HENT:  Head: Normocephalic  and atraumatic.  Eyes: Conjunctivae are normal.  Neck: Neck supple. No thyromegaly present.  Cardiovascular: Normal rate, regular rhythm and normal heart sounds.   No murmur heard. Pulmonary/Chest: Effort normal and breath sounds normal. She has no wheezes.  Abdominal: She exhibits no distension and no mass.  Musculoskeletal: She exhibits tenderness. She exhibits no edema.  Right SCM spasm and tender with palp  Lymphadenopathy:    She has no cervical adenopathy.  Neurological: She is alert and oriented to person, place, and time.  Skin: Skin is warm and dry. No rash noted. She is not diaphoretic.  Psychiatric: Memory, affect and judgment normal.    Lab Results  Component Value Date   TSH 1.22 07/31/2014  Lab Results  Component Value Date   WBC 10.4 07/31/2014   HGB 14.3 07/31/2014   HCT 44.9 07/31/2014   MCV 91.8 07/31/2014   PLT 420.0* 07/31/2014   Lab Results  Component Value Date   CREATININE 0.8 07/31/2014   BUN 18 07/31/2014   NA 142 07/31/2014   K 3.8 07/31/2014   CL 105 07/31/2014   CO2 23 07/31/2014   Lab Results  Component Value Date   ALT 19 07/31/2014   AST 18 07/31/2014   ALKPHOS 71 07/31/2014   BILITOT 0.5 07/31/2014   Lab Results  Component Value Date   CHOL 161 07/31/2014   Lab Results  Component Value Date   HDL 31.70* 07/31/2014   Lab Results  Component Value Date   LDLCALC 111* 07/31/2014   Lab Results  Component Value Date   TRIG 92.0 07/31/2014   Lab Results  Component Value Date   CHOLHDL 5 07/31/2014     Assessment & Plan  Essential hypertension Well controlled, no changes to meds. Encouraged heart healthy diet such as the DASH diet and exercise as tolerated.    Diabetes mellitus type 2 in obese hgba1c mildly elevated with last check, minimize simple carbs. Increase exercise as tolerated. Continue current meds. Home numbers improving.   Obesity Encouraged DASH diet, decrease po intake and increase exercise as  tolerated. Needs 7-8 hours of sleep nightly. Avoid trans fats, eat small, frequent meals every 4-5 hours with lean proteins, complex carbs and healthy fats. Minimize simple carbs,    Thoracic outlet syndrome On right, describes stiff neck and tingling intermittently into right arm. Encouraged moist heat and gentle stretching as tolerated. May try NSAIDs and prescription meds as directed and report if symptoms worsen or seek immediate care. Given rx for Cyclobenzaprine for qhs and NSAIDs in am with good

## 2014-11-03 NOTE — Assessment & Plan Note (Signed)
Well controlled, no changes to meds. Encouraged heart healthy diet such as the DASH diet and exercise as tolerated.  °

## 2014-11-03 NOTE — Assessment & Plan Note (Addendum)
On right, describes stiff neck and tingling intermittently into right arm. Encouraged moist heat and gentle stretching as tolerated. May try NSAIDs and prescription meds as directed and report if symptoms worsen or seek immediate care. Given rx for Cyclobenzaprine for qhs and NSAIDs in am with good

## 2014-11-03 NOTE — Assessment & Plan Note (Signed)
Encouraged DASH diet, decrease po intake and increase exercise as tolerated. Needs 7-8 hours of sleep nightly. Avoid trans fats, eat small, frequent meals every 4-5 hours with lean proteins, complex carbs and healthy fats. Minimize simple carbs, 

## 2014-11-04 NOTE — Progress Notes (Signed)
Patient ID: Eileen Richards, female   DOB: 06-25-1961, 54 y.o.   MRN: [id number]  Subjective: Eileen Richards returns the office today for follow-up evaluation of bilateral retrocalcaneal exostosis and insertional Achilles tendinitis. She states that the left is more painful than the right. She states she has continued with stretching and icing without much relief. She has attempted shoegear modifications as well which does not seem to help. She did not get the compound cream due to cost. Denies any systemic complaints as fevers, chills, nausea, vomiting. She denies any acute changes since last appointment. No other complaints at this time.  Objective: AAO x3, NAD DP/PT pulses palpable bilaterally, CRT less than 3 seconds Protective sensation intact with Simms Weinstein monofilament, vibratory sensation intact, Achilles tendon reflex intact Prominent retrocalcaneal exostosis bilaterally. There is tenderness over the site on the insertion of the Achilles tendon on the left worse than the right. There is no pain along the course of the Achilles tendon mid substance bilaterally. There is no defect identified within the Achilles tendon and the Achilles tendon appears to be intact. No overlying edema, erythema, increase in warmth. There is equinus present bilaterally. No pain with lateral compression of the calcaneus or pain with vibratory sensation bilaterally. MMT 5/5, ROM WNL No open lesions or pre-ulcerative lesions.  No calf pain with compression, swelling, warmth, erythema.  Assessment: 54 year old female with continued pain along prominent retrocalcaneal exostosis and insertional Achilles tendinitis with the left the most significant.   Plan: -Treatment options were discussed including alternatives, risks, competitions. -At this time as she has attempted multiple options will refer to physical therapy for evaluation and treatment. In the meantime, continue with stretching and icing.  -Follow-up  after PT evaluation, or sooner should any problems arise. Encouraged to call the office with any questions/concerns/change in symptoms.

## 2014-11-25 NOTE — Progress Notes (Signed)
Patient ID: Eileen Richards, female   DOB: Nov 22, 1960, 54 y.o.   MRN: [id number] ATTENDING PHYSICIAN NOTE: I have reviewed the chart and agree with the plan as detailed above. Dorcas Mcmurray MD Pager [phone]

## 2014-12-10 ENCOUNTER — Ambulatory Visit: Payer: 59 | Admitting: Podiatry

## 2014-12-11 ENCOUNTER — Ambulatory Visit (INDEPENDENT_AMBULATORY_CARE_PROVIDER_SITE_OTHER): Payer: 59 | Admitting: Family Medicine

## 2014-12-11 ENCOUNTER — Encounter: Payer: Self-pay | Admitting: Family Medicine

## 2014-12-11 VITALS — BP 116/72 | HR 78 | Temp 97.8°F | Resp 16 | Wt 239.4 lb

## 2014-12-11 DIAGNOSIS — I10 Essential (primary) hypertension: Secondary | ICD-10-CM

## 2014-12-11 DIAGNOSIS — M7661 Achilles tendinitis, right leg: Secondary | ICD-10-CM

## 2014-12-11 DIAGNOSIS — E782 Mixed hyperlipidemia: Secondary | ICD-10-CM

## 2014-12-11 DIAGNOSIS — E119 Type 2 diabetes mellitus without complications: Secondary | ICD-10-CM | POA: Diagnosis not present

## 2014-12-11 DIAGNOSIS — E1169 Type 2 diabetes mellitus with other specified complication: Secondary | ICD-10-CM

## 2014-12-11 DIAGNOSIS — K219 Gastro-esophageal reflux disease without esophagitis: Secondary | ICD-10-CM | POA: Diagnosis not present

## 2014-12-11 DIAGNOSIS — E669 Obesity, unspecified: Secondary | ICD-10-CM

## 2014-12-11 DIAGNOSIS — R609 Edema, unspecified: Secondary | ICD-10-CM

## 2014-12-11 LAB — CBC
HCT: 44.7 % (ref 36.0–46.0)
Hemoglobin: 14.8 g/dL (ref 12.0–15.0)
MCH: 29.6 pg (ref 26.0–34.0)
MCHC: 33.1 g/dL (ref 30.0–36.0)
MCV: 89.4 fL (ref 78.0–100.0)
MPV: 10 fL (ref 8.6–12.4)
Platelets: 385 10*3/uL (ref 150–400)
RBC: 5 MIL/uL (ref 3.87–5.11)
RDW: 14.5 % (ref 11.5–15.5)
WBC: 10.4 10*3/uL (ref 4.0–10.5)

## 2014-12-11 NOTE — Patient Instructions (Signed)

## 2014-12-11 NOTE — Progress Notes (Signed)
Pre visit review using our clinic review tool, if applicable. No additional management support is needed unless otherwise documented below in the visit note. 

## 2014-12-12 LAB — COMPREHENSIVE METABOLIC PANEL
ALT: 14 U/L (ref 0–35)
AST: 14 U/L (ref 0–37)
Albumin: 4.1 g/dL (ref 3.5–5.2)
Alkaline Phosphatase: 88 U/L (ref 39–117)
BUN: 14 mg/dL (ref 6–23)
CO2: 26 mEq/L (ref 19–32)
Calcium: 9.6 mg/dL (ref 8.4–10.5)
Chloride: 99 mEq/L (ref 96–112)
Creat: 0.71 mg/dL (ref 0.50–1.10)
Glucose, Bld: 110 mg/dL — ABNORMAL HIGH (ref 70–99)
Potassium: 3.8 mEq/L (ref 3.5–5.3)
Sodium: 140 mEq/L (ref 135–145)
Total Bilirubin: 0.4 mg/dL (ref 0.2–1.2)
Total Protein: 7.4 g/dL (ref 6.0–8.3)

## 2014-12-12 LAB — LIPID PANEL
Cholesterol: 188 mg/dL (ref 0–200)
HDL: 41 mg/dL — ABNORMAL LOW (ref >=46)
LDL Cholesterol: 111 mg/dL — ABNORMAL HIGH (ref 0–99)
Total CHOL/HDL Ratio: 4.6 Ratio
Triglycerides: 180 mg/dL — ABNORMAL HIGH (ref <150)
VLDL: 36 mg/dL (ref 0–40)

## 2014-12-12 LAB — TSH: TSH: 1.525 u[IU]/mL (ref 0.350–4.500)

## 2014-12-12 LAB — HEMOGLOBIN A1C
Hgb A1c MFr Bld: 7.6 % — ABNORMAL HIGH (ref <5.7)
Mean Plasma Glucose: 171 mg/dL — ABNORMAL HIGH (ref <117)

## 2014-12-15 ENCOUNTER — Telehealth: Payer: Self-pay | Admitting: Family Medicine

## 2014-12-15 NOTE — Telephone Encounter (Signed)
Caller name: Kasidy, Gianino Relation to pt: self  Call back number: [phone]  Reason for call:  Pt returning call regarding lab results. Pt states please leave detail message because she is at work

## 2014-12-15 NOTE — Telephone Encounter (Signed)
Notes Recorded by Donell Sievert Ewing, CMA on 12/15/2014 at 8:55 AM Called left message to call back Notes Recorded by Mosie Lukes, MD on 12/12/2014 at 4:21 PM Her cholesterol is not as bad as expected, her sugar is slightly improved. Would recommend she try to take the Welchol 3 x a week, she has only been taking once a week. Minimize simple carbs and saturated fats.  Patient informed, understood & agreed/SLS

## 2014-12-16 ENCOUNTER — Other Ambulatory Visit: Payer: Self-pay | Admitting: Family Medicine

## 2014-12-21 ENCOUNTER — Encounter: Payer: Self-pay | Admitting: Family Medicine

## 2014-12-21 NOTE — Assessment & Plan Note (Signed)
Is following with podiatry Dr Jacqualyn Posey and is responding to treatment including iontophoresis, will continue the same

## 2014-12-21 NOTE — Assessment & Plan Note (Signed)
Encouraged DASH diet, decrease po intake and increase exercise as tolerated. Needs 7-8 hours of sleep nightly. Avoid trans fats, eat small, frequent meals every 4-5 hours with lean proteins, complex carbs and healthy fats. Minimize simple carbs, GMO foods. 

## 2014-12-21 NOTE — Assessment & Plan Note (Signed)
Well controlled, no changes to meds. Encouraged heart healthy diet such as the DASH diet and exercise as tolerated.  °

## 2014-12-21 NOTE — Assessment & Plan Note (Signed)
Avoid offending foods, start probiotics. Do not eat large meals in late evening and consider raising head of bed. Continue current meds as needed

## 2014-12-21 NOTE — Assessment & Plan Note (Signed)
Tolerating statin, encouraged heart healthy diet, avoid trans fats, minimize simple carbs and saturated fats. Increase exercise as tolerated. Struggles to remember her Welchol and reports she probably only takes it once a week. Encouraged to try and take more often

## 2014-12-21 NOTE — Progress Notes (Signed)
Eileen Richards [id number] 08-Aug-1961 12/21/2014      Progress Note New Patient  Subjective  Chief Complaint  Chief Complaint  Patient presents with  . Follow-up    Paresthesias in right hand    HPI  Patient is a 54 year old female in today for routine medical care. Patient is in today for follow-up. Notes a good improvement in her foot pain with treatment at podiatry. She has been subjected to numerous treatments including iontophoresis with good results. No new complaints. Reports her blood sugars are improving. Her sugars this week and range from 105-140. No polyuria or polydipsia. She is physically forgetting to take her WelChol often only taking it once a week. No recent illness or acute complaints. Denies CP/palp/SOB/HA/congestion/fevers/GI or GU c/o. Taking meds as prescribed  Past Medical History  Diagnosis Date  . Diabetes mellitus   . Hypertension   . Hyperlipemia   . Atypical chest pain     normal stress echo 02/2009 -LV size was normal LV global systolic function was normal- Normal wall motion; no LV regional wall motion abnormalities  . Asthma     childhood  . GERD (gastroesophageal reflux disease)   . Pain of right heel 10/14/2012  . Achilles tendonitis 10/14/2012  . Chest pain, atypical 08/24/2013  . Onychomycosis 04/17/2014  . Thoracic outlet syndrome 11/03/2014    Past Surgical History  Procedure Laterality Date  . Abdominal hysterectomy    . Dilation and curettage of uterus      Lysis of Adhesions  . Robotic assisted salpingo oopherectomy  07/23/2012    Procedure: ROBOTIC ASSISTED SALPINGO OOPHERECTOMY;  Surgeon: Marvene Staff, MD;  Location: [facility];  Service: Gynecology;  Laterality: Bilateral;  . Robotic assisted laparoscopic lysis of adhesion  07/23/2012    Procedure: ROBOTIC ASSISTED LAPAROSCOPIC LYSIS OF ADHESION;  Surgeon: Marvene Staff, MD;  Location: [facility];  Service: Gynecology;  Laterality: N/A;  . Cystoscopy  07/23/2012    Procedure:  CYSTOSCOPY;  Surgeon: Marvene Staff, MD;  Location: [facility];  Service: Gynecology;  Laterality: N/A;    Family History  Problem Relation Age of Onset  . GER disease Mother   . GER disease Father   . Heart disease Father     father has "heart problems"    History   Social History  . Marital Status: Single    Spouse Name: N/A  . Number of Children: N/A  . Years of Education: N/A   Occupational History  . Not on file.   Social History Main Topics  . Smoking status: Former Research scientist (life sciences)  . Smokeless tobacco: Never Used  . Alcohol Use: No  . Drug Use: No  . Sexual Activity: Not on file   Other Topics Concern  . Not on file   Social History Narrative    Current Outpatient Prescriptions on File Prior to Visit  Medication Sig Dispense Refill  . albuterol (PROVENTIL HFA;VENTOLIN HFA) 108 (90 BASE) MCG/ACT inhaler Inhale 2 puffs into the lungs every 6 (six) hours as needed for wheezing or shortness of breath. 1 Inhaler 3  . aspirin 81 MG tablet Take 81 mg by mouth daily.    . Blood Glucose Monitoring Suppl (TRUETRACK BLOOD GLUCOSE) W/DEVICE KIT by Does not apply route.      . carvedilol (COREG) 3.125 MG tablet TAKE 1 TABLET BY MOUTH 2 TIMES DAILY WITH A MEAL 60 tablet 11  . cyclobenzaprine (FLEXERIL) 10 MG tablet Take 1 tablet (10 mg total) by mouth  at bedtime as needed for muscle spasms. 30 tablet 1  . Efinaconazole 10 % SOLN Apply 1 drop topically daily. 4 mL 11  . glimepiride (AMARYL) 4 MG tablet TAKE 1 TABLET BY MOUTH TWICE DAILY 60 tablet 11  . hydrochlorothiazide (HYDRODIURIL) 25 MG tablet TAKE 1 TABLET BY MOUTH DAILY. 90 tablet 1  . INVOKANA 100 MG TABS tablet TAKE 1 TABLET BY MOUTH DAILY. 30 tablet 2  . JANUVIA 100 MG tablet TAKE 1 TABLET BY MOUTH DAILY. 30 tablet 11  . Lancet Device MISC by Does not apply route. Use to test blood sugar three times daily     . meloxicam (MOBIC) 15 MG tablet Take 1 tablet (15 mg total) by mouth daily as needed for pain. 30 tablet 1  .  Multiple Vitamin (MULTIVITAMIN) tablet Take 1 tablet by mouth daily.      . pantoprazole (PROTONIX) 40 MG tablet TAKE 1 TABLET BY MOUTH DAILY 30 tablet 2  . simvastatin (ZOCOR) 20 MG tablet TAKE 1 TABLET BY MOUTH AT BEDTIME. 90 tablet 5  . Tavaborole (KERYDIN) 5 % SOLN Apply 1 drop topically 1 day or 1 dose. Apply 1 drop to the toenail daily. 1 Bottle 24  . traMADol (ULTRAM) 50 MG tablet Take 2 tablets (100 mg total) by mouth 2 (two) times daily as needed. 60 tablet 2  . TRUETEST TEST test strip USE TO TEST BLOOD SUGAR THREE TIMES A DAY 100 each 3  . ALPRAZolam (XANAX) 0.25 MG tablet Take 1 tablet (0.25 mg total) by mouth 2 (two) times daily as needed for anxiety (palpitations). (Patient not taking: Reported on 10/27/2014) 20 tablet 0  . nitroGLYCERIN (NITROSTAT) 0.4 MG SL tablet Place 1 tablet (0.4 mg total) under the tongue every 5 (five) minutes as needed for chest pain. 25 tablet 3   No current facility-administered medications on file prior to visit.    No Known Allergies  Review of Systems  Review of Systems  Constitutional: Negative for fever and malaise/fatigue.  HENT: Negative for congestion.   Eyes: Negative for discharge.  Respiratory: Negative for shortness of breath.   Cardiovascular: Negative for chest pain, palpitations and leg swelling.  Gastrointestinal: Negative for nausea, abdominal pain and diarrhea.  Genitourinary: Negative for dysuria.  Musculoskeletal: Negative for falls.  Skin: Negative for rash.  Neurological: Negative for loss of consciousness and headaches.  Endo/Heme/Allergies: Negative for polydipsia.  Psychiatric/Behavioral: Negative for depression and suicidal ideas. The patient is not nervous/anxious and does not have insomnia.     Objective  BP 116/72 mmHg  Pulse 78  Temp(Src) 97.8 F (36.6 C) (Oral)  Resp 16  Wt 239 lb 6 oz (108.58 kg)  SpO2 97%  Physical Exam  Physical Exam  Constitutional: She is oriented to person, place, and time and  well-developed, well-nourished, and in no distress. No distress.  HENT:  Head: Normocephalic and atraumatic.  Eyes: Conjunctivae are normal.  Neck: Neck supple. No thyromegaly present.  Cardiovascular: Normal rate, regular rhythm and normal heart sounds.   No murmur heard. Pulmonary/Chest: Effort normal and breath sounds normal. She has no wheezes.  Abdominal: She exhibits no distension and no mass.  Musculoskeletal: She exhibits no edema.  Lymphadenopathy:    She has no cervical adenopathy.  Neurological: She is alert and oriented to person, place, and time.  Skin: Skin is warm and dry. No rash noted. She is not diaphoretic.  Psychiatric: Memory, affect and judgment normal.       Assessment & Plan  No problem-specific assessment & plan notes found for this encounter.

## 2014-12-21 NOTE — Assessment & Plan Note (Signed)
hgba1c unacceptable but improving, minimize simple carbs. Increase exercise as tolerated. Continue current meds 

## 2015-01-20 ENCOUNTER — Other Ambulatory Visit: Payer: Self-pay | Admitting: Family Medicine

## 2015-01-22 ENCOUNTER — Other Ambulatory Visit: Payer: Self-pay | Admitting: *Deleted

## 2015-01-22 VITALS — BP 110/72 | Ht 64.0 in | Wt 242.4 lb

## 2015-01-22 DIAGNOSIS — E119 Type 2 diabetes mellitus without complications: Secondary | ICD-10-CM

## 2015-01-26 ENCOUNTER — Encounter: Payer: Self-pay | Admitting: *Deleted

## 2015-01-26 NOTE — Patient Outreach (Signed)
[facility]) Care Management   01/22/2015  Eileen Richards 1960/12/09 [id number]  Eileen Richards is an 54 y.o. female who presents for routine Link To Wellness follow up for self management assistance of Type II DM.  Subjective:  Eileen Richards voices no complaints. Says she is remembering to take her medicines and is taking the Welchol 3 times weekly as directed by her primary care provider. Says she is concerned about her 6 lb weight gain in the last 3 months and is asking about a meal replacement shake she saw advertised in a magazine called Almased.  Objective:   Review of Systems  Constitutional: Negative.     Physical Exam  Constitutional: She is oriented to person, place, and time. She appears well-developed and well-nourished.  Neurological: She is alert and oriented to person, place, and time.  Psychiatric: She has a normal mood and affect. Her behavior is normal. Judgment and thought content normal.   Filed Weights   01/22/15 1612  Weight: 242 lb 6.4 oz (109.952 kg)   Filed Vitals:   01/22/15 1612  BP: 110/72   Current Medications:   Current Outpatient Prescriptions  Medication Sig Dispense Refill  . albuterol (PROVENTIL HFA;VENTOLIN HFA) 108 (90 BASE) MCG/ACT inhaler Inhale 2 puffs into the lungs every 6 (six) hours as needed for wheezing or shortness of breath. 1 Inhaler 3  . aspirin 81 MG tablet Take 81 mg by mouth daily.    . carvedilol (COREG) 3.125 MG tablet TAKE 1 TABLET BY MOUTH 2 TIMES DAILY WITH A MEAL 60 tablet 11  . glimepiride (AMARYL) 4 MG tablet TAKE 1 TABLET BY MOUTH TWICE DAILY 60 tablet 11  . hydrochlorothiazide (HYDRODIURIL) 25 MG tablet TAKE 1 TABLET BY MOUTH DAILY. 90 tablet 1  . INVOKANA 100 MG TABS tablet TAKE 1 TABLET BY MOUTH DAILY. 30 tablet 2  . INVOKANA 100 MG TABS tablet TAKE 1 TABLET BY MOUTH DAILY. 30 tablet 2  . JANUVIA 100 MG tablet TAKE 1 TABLET BY MOUTH DAILY. 30 tablet 11  . meloxicam (MOBIC) 15 MG tablet Take 1 tablet  (15 mg total) by mouth daily as needed for pain. 30 tablet 1  . metFORMIN (GLUCOPHAGE-XR) 500 MG 24 hr tablet TAKE 2 TABLETS BY MOUTH TWICE DAILY AND 1 TABLET AT NOON 150 tablet 3  . pantoprazole (PROTONIX) 40 MG tablet TAKE 1 TABLET BY MOUTH DAILY 30 tablet 5  . simvastatin (ZOCOR) 20 MG tablet TAKE 1 TABLET BY MOUTH AT BEDTIME. 90 tablet 5  . Tavaborole (KERYDIN) 5 % SOLN Apply 1 drop topically 1 day or 1 dose. Apply 1 drop to the toenail daily. 1 Bottle 24  . traMADol (ULTRAM) 50 MG tablet Take 2 tablets (100 mg total) by mouth 2 (two) times daily as needed. 60 tablet 2  . TRUETEST TEST test strip USE TO TEST BLOOD SUGAR THREE TIMES A DAY 100 each 3  . WELCHOL 3.75 G PACK TAKE 1 PACKET BY MOUTH DAILY. 30 each 2  . ALPRAZolam (XANAX) 0.25 MG tablet Take 1 tablet (0.25 mg total) by mouth 2 (two) times daily as needed for anxiety (palpitations). (Patient not taking: Reported on 10/27/2014) 20 tablet 0  . Blood Glucose Monitoring Suppl (TRUETRACK BLOOD GLUCOSE) W/DEVICE KIT by Does not apply route.      . cyclobenzaprine (FLEXERIL) 10 MG tablet Take 1 tablet (10 mg total) by mouth at bedtime as needed for muscle spasms. (Patient not taking: Reported on 01/22/2015) 30 tablet 1  . Efinaconazole  10 % SOLN Apply 1 drop topically daily. (Patient not taking: Reported on 01/22/2015) 4 mL 11  . INVOKANA 100 MG TABS tablet TAKE 1 TABLET BY MOUTH DAILY. 30 tablet 5  . Lancet Device MISC by Does not apply route. Use to test blood sugar three times daily     . Multiple Vitamin (MULTIVITAMIN) tablet Take 1 tablet by mouth daily.      . nitroGLYCERIN (NITROSTAT) 0.4 MG SL tablet Place 1 tablet (0.4 mg total) under the tongue every 5 (five) minutes as needed for chest pain. (Patient not taking: Reported on 01/22/2015) 25 tablet 3   No current facility-administered medications for this visit.    Functional Status:   In your present state of health, do you have any difficulty performing the following activities: 01/22/2015  09/09/2014  Hearing? N N  Vision? N N  Difficulty concentrating or making decisions? N N  Walking or climbing stairs? N Y  Dressing or bathing? - N  Doing errands, shopping? N N    Fall/Depression Screening:    PHQ 2/9 Scores 01/22/2015  PHQ - 2 Score 0   THN CM Care Plan Problem One        Patient Outreach from 01/22/2015 in [place] Problem One  Type II DM not meeting A1C target of 7.0% as evidenced by A1C= 7.6% on 12/11/14, HDL of 31 not meeting target and LDL of 111 not meeting target   Care Plan for Problem One  Active   THN Long Term Goal (31-90 days)  Improved glycemic control as evidenced by improved A1C,  A1C < 7.5% and HDL and LDL showing improvement at next check   [place] Term Goal Start Date  01/22/15   Interventions for Problem One Long Term Goal  congratulated [place] on improving her A1C, discussed strategies to continue to improve glycemic control and to lose weight since she has gained 6 lbs in the last 3 months, suggested she review the printed information on CHO counting provided at her visit with Dr. Charlett Blake in March, reviewed label of the meal replacement shake Almased and provided feedback, reviewed results of her lipid profile and discussed strategies to reduce her LDL and increase her HDL by engaging in consistent exercise and consistently following a CHO controlled meal plan, arranged for Link To Wellness follow up in August        Assessment:   Link To Wellness member meeting blood pressure targets but not A1C, LDL and HDL targets.  Plan:  Will fax today's note to Dr. Randel Pigg. Will meet with Levada Dy every 3 months and as needed for self manageent assistance of HTN, Type II DM and hyperlipidemia and assess progress towards mutually set goals.  Barrington Ellison RN,CCM,CDE [place] Management Coordinator Office Phone [phone] Office Fax [phone]

## 2015-02-25 ENCOUNTER — Telehealth: Payer: Self-pay | Admitting: *Deleted

## 2015-02-25 NOTE — Telephone Encounter (Signed)
FMLA forms received from patient for re-certification. Forms filled out and forwarded to Dr. Charlett Blake. JG//CMA

## 2015-02-27 NOTE — Telephone Encounter (Signed)
Forms faxed to [employer] [place]) successfully. JG//CMA

## 2015-03-10 ENCOUNTER — Ambulatory Visit: Payer: Self-pay | Admitting: Family Medicine

## 2015-04-01 ENCOUNTER — Other Ambulatory Visit: Payer: Self-pay | Admitting: Family Medicine

## 2015-04-01 NOTE — Telephone Encounter (Signed)
Lasts labs/OV: 12/11/14  Pt would also like a refill of her mobic. Please advise

## 2015-04-02 ENCOUNTER — Other Ambulatory Visit: Payer: Self-pay | Admitting: Family Medicine

## 2015-04-02 MED ORDER — TRAMADOL HCL 50 MG PO TABS: 100.0000 mg | ORAL_TABLET | Freq: Two times a day (BID) | ORAL | Status: DC | PRN

## 2015-04-02 NOTE — Telephone Encounter (Signed)
Faxed hardcopy for Tramadol to [facility].

## 2015-04-02 NOTE — Telephone Encounter (Signed)
Printed and on counter for signature. 

## 2015-04-02 NOTE — Addendum Note (Signed)
Addended by: Sharon Seller B on: 04/02/2015 01:02 PM   Modules accepted: Orders

## 2015-04-02 NOTE — Telephone Encounter (Signed)
Requesting:Tramadol Contract  Signed on 10/27/14 UDS  Given on 10/27/14 Low Risk Last OV  12/11/14 Last Refill  #60 with 2 refills on 05/29/14  Please Advise

## 2015-04-16 ENCOUNTER — Ambulatory Visit (INDEPENDENT_AMBULATORY_CARE_PROVIDER_SITE_OTHER): Payer: 59 | Admitting: Family Medicine

## 2015-04-16 ENCOUNTER — Encounter: Payer: Self-pay | Admitting: Family Medicine

## 2015-04-16 VITALS — BP 122/86 | HR 75 | Temp 98.0°F | Ht 64.0 in | Wt 242.5 lb

## 2015-04-16 DIAGNOSIS — E669 Obesity, unspecified: Secondary | ICD-10-CM | POA: Diagnosis not present

## 2015-04-16 DIAGNOSIS — H8110 Benign paroxysmal vertigo, unspecified ear: Secondary | ICD-10-CM

## 2015-04-16 DIAGNOSIS — E119 Type 2 diabetes mellitus without complications: Secondary | ICD-10-CM

## 2015-04-16 DIAGNOSIS — E1169 Type 2 diabetes mellitus with other specified complication: Secondary | ICD-10-CM

## 2015-04-16 DIAGNOSIS — E782 Mixed hyperlipidemia: Secondary | ICD-10-CM

## 2015-04-16 DIAGNOSIS — I10 Essential (primary) hypertension: Secondary | ICD-10-CM | POA: Diagnosis not present

## 2015-04-16 DIAGNOSIS — K219 Gastro-esophageal reflux disease without esophagitis: Secondary | ICD-10-CM

## 2015-04-16 MED ORDER — MECLIZINE HCL 25 MG PO TABS: 25.0000 mg | ORAL_TABLET | Freq: Three times a day (TID) | ORAL | Status: DC | PRN

## 2015-04-16 NOTE — Patient Instructions (Signed)

## 2015-04-16 NOTE — Progress Notes (Signed)
Pre visit review using our clinic review tool, if applicable. No additional management support is needed unless otherwise documented below in the visit note. 

## 2015-04-17 LAB — LIPID PANEL
Cholesterol: 163 mg/dL (ref 0–200)
HDL: 41.5 mg/dL (ref >39.00)
LDL Cholesterol: 87 mg/dL (ref 0–99)
NonHDL: 121.78
Total CHOL/HDL Ratio: 4
Triglycerides: 176 mg/dL — ABNORMAL HIGH (ref 0.0–149.0)
VLDL: 35.2 mg/dL (ref 0.0–40.0)

## 2015-04-17 LAB — TSH: TSH: 1.39 u[IU]/mL (ref 0.35–4.50)

## 2015-04-17 LAB — CBC
HCT: 44.8 % (ref 36.0–46.0)
Hemoglobin: 14.7 g/dL (ref 12.0–15.0)
MCHC: 32.8 g/dL (ref 30.0–36.0)
MCV: 91.6 fl (ref 78.0–100.0)
Platelets: 379 10*3/uL (ref 150.0–400.0)
RBC: 4.89 Mil/uL (ref 3.87–5.11)
RDW: 14.4 % (ref 11.5–15.5)
WBC: 11 10*3/uL — ABNORMAL HIGH (ref 4.0–10.5)

## 2015-04-17 LAB — HEMOGLOBIN A1C: Hgb A1c MFr Bld: 7.3 % — ABNORMAL HIGH (ref 4.6–6.5)

## 2015-04-17 LAB — T4, FREE: Free T4: 0.76 ng/dL (ref 0.60–1.60)

## 2015-04-17 LAB — MICROALBUMIN / CREATININE URINE RATIO
Creatinine,U: 136.8 mg/dL
Microalb Creat Ratio: 0.6 mg/g (ref 0.0–30.0)
Microalb, Ur: 0.8 mg/dL (ref 0.0–1.9)

## 2015-04-17 LAB — VITAMIN B12: Vitamin B-12: 457 pg/mL (ref 211–911)

## 2015-04-26 ENCOUNTER — Encounter: Payer: Self-pay | Admitting: Family Medicine

## 2015-04-26 DIAGNOSIS — H8110 Benign paroxysmal vertigo, unspecified ear: Secondary | ICD-10-CM

## 2015-04-26 HISTORY — DX: Benign paroxysmal vertigo, unspecified ear: H81.10

## 2015-04-26 NOTE — Assessment & Plan Note (Signed)
Well controlled, no changes to meds. Encouraged heart healthy diet such as the DASH diet and exercise as tolerated.  °

## 2015-04-26 NOTE — Assessment & Plan Note (Signed)
Encouraged DASH diet, decrease po intake and increase exercise as tolerated. Needs 7-8 hours of sleep nightly. Avoid trans fats, eat small, frequent meals every 4-5 hours with lean proteins, complex carbs and healthy fats. Minimize simple carbs 

## 2015-04-26 NOTE — Progress Notes (Signed)
Eileen Richards  [id number] 1961/07/31 04/26/2015      Progress Note-Follow Up  Subjective  Chief Complaint  Chief Complaint  Patient presents with  . Follow-up    HPI  Patient is a 54 y.o. female in today for routine medical care. Patient is in today for follow-up and has been struggling with vertigo for several days. Notes when she rolls over in bed the room starts to spin. Some nausea is present but no vomiting. No HA or other neurologic complaint. No respiratory concerns. Denies CP/palp/SOB/HA/congestion/fevers/GI or GU c/o. Taking meds as prescribed  Past Medical History  Diagnosis Date  . Diabetes mellitus   . Hypertension   . Hyperlipemia   . Atypical chest pain     normal stress echo 02/2009 -LV size was normal LV global systolic function was normal- Normal wall motion; no LV regional wall motion abnormalities  . Asthma     childhood  . GERD (gastroesophageal reflux disease)   . Pain of right heel 10/14/2012  . Achilles tendonitis 10/14/2012  . Chest pain, atypical 08/24/2013  . Onychomycosis 04/17/2014  . Thoracic outlet syndrome 11/03/2014  . Benign paroxysmal positional vertigo 04/26/2015    Past Surgical History  Procedure Laterality Date  . Abdominal hysterectomy    . Dilation and curettage of uterus      Lysis of Adhesions  . Robotic assisted salpingo oopherectomy  07/23/2012    Procedure: ROBOTIC ASSISTED SALPINGO OOPHERECTOMY;  Surgeon: Marvene Staff, MD;  Location: [facility];  Service: Gynecology;  Laterality: Bilateral;  . Robotic assisted laparoscopic lysis of adhesion  07/23/2012    Procedure: ROBOTIC ASSISTED LAPAROSCOPIC LYSIS OF ADHESION;  Surgeon: Marvene Staff, MD;  Location: [facility];  Service: Gynecology;  Laterality: N/A;  . Cystoscopy  07/23/2012    Procedure: CYSTOSCOPY;  Surgeon: Marvene Staff, MD;  Location: [facility];  Service: Gynecology;  Laterality: N/A;    Family History  Problem Relation Age of Onset  . GER disease Mother     . GER disease Father   . Heart disease Father     father has "heart problems"    History   Social History  . Marital Status: Single    Spouse Name: N/A  . Number of Children: N/A  . Years of Education: N/A   Occupational History  . Not on file.   Social History Main Topics  . Smoking status: Former Research scientist (life sciences)  . Smokeless tobacco: Never Used  . Alcohol Use: No  . Drug Use: No  . Sexual Activity: Not on file   Other Topics Concern  . Not on file   Social History Narrative    Current Outpatient Prescriptions on File Prior to Visit  Medication Sig Dispense Refill  . aspirin 81 MG tablet Take 81 mg by mouth daily.    . Blood Glucose Monitoring Suppl (TRUETRACK BLOOD GLUCOSE) W/DEVICE KIT by Does not apply route.      . carvedilol (COREG) 3.125 MG tablet TAKE 1 TABLET BY MOUTH 2 TIMES DAILY WITH A MEAL 60 tablet 11  . cyclobenzaprine (FLEXERIL) 10 MG tablet Take 1 tablet (10 mg total) by mouth at bedtime as needed for muscle spasms. 30 tablet 1  . Efinaconazole 10 % SOLN Apply 1 drop topically daily. 4 mL 11  . glimepiride (AMARYL) 4 MG tablet TAKE 1 TABLET BY MOUTH TWICE DAILY 60 tablet 11  . hydrochlorothiazide (HYDRODIURIL) 25 MG tablet TAKE 1 TABLET BY MOUTH DAILY. 90 tablet 5  . INVOKANA  100 MG TABS tablet TAKE 1 TABLET BY MOUTH DAILY. 30 tablet 2  . INVOKANA 100 MG TABS tablet TAKE 1 TABLET BY MOUTH DAILY. 30 tablet 2  . INVOKANA 100 MG TABS tablet TAKE 1 TABLET BY MOUTH DAILY. 30 tablet 5  . JANUVIA 100 MG tablet TAKE 1 TABLET BY MOUTH DAILY. 30 tablet 11  . Lancet Device MISC by Does not apply route. Use to test blood sugar three times daily     . meloxicam (MOBIC) 15 MG tablet TAKE 1 TABLET BY MOUTH DAILY AS NEEDED FOR PAIN. 30 tablet 1  . metFORMIN (GLUCOPHAGE-XR) 500 MG 24 hr tablet TAKE 2 TABLETS BY MOUTH TWICE DAILY AND 1 TABLET AT NOON 150 tablet 3  . Multiple Vitamin (MULTIVITAMIN) tablet Take 1 tablet by mouth daily.      . nitroGLYCERIN (NITROSTAT) 0.4 MG SL  tablet Place 1 tablet (0.4 mg total) under the tongue every 5 (five) minutes as needed for chest pain. 25 tablet 3  . pantoprazole (PROTONIX) 40 MG tablet TAKE 1 TABLET BY MOUTH DAILY 30 tablet 5  . simvastatin (ZOCOR) 20 MG tablet TAKE 1 TABLET BY MOUTH AT BEDTIME. 90 tablet 5  . Tavaborole (KERYDIN) 5 % SOLN Apply 1 drop topically 1 day or 1 dose. Apply 1 drop to the toenail daily. 1 Bottle 24  . traMADol (ULTRAM) 50 MG tablet Take 2 tablets (100 mg total) by mouth 2 (two) times daily as needed. 60 tablet 0  . TRUETEST TEST test strip USE TO TEST BLOOD SUGAR THREE TIMES A DAY 100 each 3  . WELCHOL 3.75 G PACK TAKE 1 PACKET BY MOUTH DAILY. 30 each 2  . albuterol (PROVENTIL HFA;VENTOLIN HFA) 108 (90 BASE) MCG/ACT inhaler Inhale 2 puffs into the lungs every 6 (six) hours as needed for wheezing or shortness of breath. (Patient not taking: Reported on 04/16/2015) 1 Inhaler 3  . ALPRAZolam (XANAX) 0.25 MG tablet Take 1 tablet (0.25 mg total) by mouth 2 (two) times daily as needed for anxiety (palpitations). (Patient not taking: Reported on 04/16/2015) 20 tablet 0   No current facility-administered medications on file prior to visit.    No Known Allergies  Review of Systems  Review of Systems  Constitutional: Positive for malaise/fatigue. Negative for fever.  HENT: Negative for congestion.   Eyes: Negative for discharge.  Respiratory: Negative for shortness of breath.   Cardiovascular: Negative for chest pain, palpitations and leg swelling.  Gastrointestinal: Negative for nausea, abdominal pain and diarrhea.  Genitourinary: Negative for dysuria.  Musculoskeletal: Negative for falls.  Skin: Negative for rash.  Neurological: Positive for dizziness. Negative for loss of consciousness and headaches.  Endo/Heme/Allergies: Negative for polydipsia.  Psychiatric/Behavioral: Negative for depression and suicidal ideas. The patient is nervous/anxious. The patient does not have insomnia.      Objective  BP 122/86 mmHg  Pulse 75  Temp(Src) 98 F (36.7 C) (Oral)  Ht _0  (1.626 m)  Wt 242 lb 8 oz (109.997 kg)  BMI 41.60 kg/m2  SpO2 94%  Physical Exam  Physical Exam  Constitutional: She is oriented to person, place, and time and well-developed, well-nourished, and in no distress. No distress.  HENT:  Head: Normocephalic and atraumatic.  Eyes: Conjunctivae are normal.  1 beat of nystagmus with leftward gaze.   Neck: Neck supple. No thyromegaly present.  Cardiovascular: Normal rate, regular rhythm and normal heart sounds.   No murmur heard. Pulmonary/Chest: Effort normal and breath sounds normal. She has no wheezes.  Abdominal: She exhibits no distension and no mass.  Musculoskeletal: She exhibits no edema.  Lymphadenopathy:    She has no cervical adenopathy.  Neurological: She is alert and oriented to person, place, and time.  Skin: Skin is warm and dry. No rash noted. She is not diaphoretic.  Psychiatric: Memory, affect and judgment normal.    Lab Results  Component Value Date   TSH 1.39 04/16/2015   Lab Results  Component Value Date   WBC 11.0* 04/16/2015   HGB 14.7 04/16/2015   HCT 44.8 04/16/2015   MCV 91.6 04/16/2015   PLT 379.0 04/16/2015   Lab Results  Component Value Date   CREATININE 0.71 12/11/2014   BUN 14 12/11/2014   NA 140 12/11/2014   K 3.8 12/11/2014   CL 99 12/11/2014   CO2 26 12/11/2014   Lab Results  Component Value Date   ALT 14 12/11/2014   AST 14 12/11/2014   ALKPHOS 88 12/11/2014   BILITOT 0.4 12/11/2014   Lab Results  Component Value Date   CHOL 163 04/16/2015   Lab Results  Component Value Date   HDL 41.50 04/16/2015   Lab Results  Component Value Date   LDLCALC 87 04/16/2015   Lab Results  Component Value Date   TRIG 176.0* 04/16/2015   Lab Results  Component Value Date   CHOLHDL 4 04/16/2015     Assessment & Plan  Obesity Encouraged DASH diet, decrease po intake and increase exercise as  tolerated. Needs 7-8 hours of sleep nightly. Avoid trans fats, eat small, frequent meals every 4-5 hours with lean proteins, complex carbs and healthy fats. Minimize simple carbs  Hyperlipidemia, mixed Tolerating statin, encouraged heart healthy diet, avoid trans fats, minimize simple carbs and saturated fats. Increase exercise as tolerated  Diabetes mellitus type 2 in obese hgba1c acceptable, minimize simple carbs. Increase exercise as tolerated. Continue current meds. Follows with Dr Earleen Newport, podiatry  GERD (gastroesophageal reflux disease) Avoid offending foods, take probiotics. Do not eat large meals in late evening and consider raising head of bed.   Essential hypertension Well controlled, no changes to meds. Encouraged heart healthy diet such as the DASH diet and exercise as tolerated.   Benign paroxysmal positional vertigo Patient encouraged to increase rest and hydration. May use Meclinzine prn. Report if worsens

## 2015-04-26 NOTE — Assessment & Plan Note (Signed)
Avoid offending foods, take probiotics. Do not eat large meals in late evening and consider raising head of bed.  

## 2015-04-26 NOTE — Assessment & Plan Note (Signed)
hgba1c acceptable, minimize simple carbs. Increase exercise as tolerated. Continue current meds. Follows with Dr Earleen Newport, podiatry

## 2015-04-26 NOTE — Assessment & Plan Note (Signed)
Patient encouraged to increase rest and hydration. May use Meclinzine prn. Report if worsens

## 2015-04-26 NOTE — Assessment & Plan Note (Signed)
Tolerating statin, encouraged heart healthy diet, avoid trans fats, minimize simple carbs and saturated fats. Increase exercise as tolerated 

## 2015-04-30 ENCOUNTER — Encounter: Payer: Self-pay | Admitting: *Deleted

## 2015-04-30 ENCOUNTER — Other Ambulatory Visit: Payer: Self-pay | Admitting: *Deleted

## 2015-05-01 NOTE — Patient Outreach (Signed)
[facility]) Care Management   04/30/2015  Edlyn Rosenburg 1961-08-03 [id number]  Eileen Richards is an 54 y.o. female who presents to the [place] office for routine Link To Wellness follow up for self management assistance with Type II DM, HTN and hyperlipidemia.  Subjective:  Chelby says she saw Dr. Randel Pigg in late July and received a good report although she could not recall her lab results. She says her fasting CBG this morning was 120. She still struggles with consistently following a CHO controlled meal plan and continues to gain weight. Says her bilateral Achilles tendonitis is better and attributes the improvement strangely to a fall last month during a vertigo exacerbation. She says she does not exercise because she works 3 jobs and the pain from her bilateral tendonitis.  Objective:   Review of Systems  Constitutional: Negative.     Physical Exam  Constitutional: She is oriented to person, place, and time. She appears well-developed and well-nourished.  Neurological: She is alert and oriented to person, place, and time.  Skin: Skin is warm and dry.  Psychiatric: She has a normal mood and affect. Her behavior is normal. Judgment and thought content normal.   Filed Vitals:   04/30/15 1605  BP: 122/70   Filed Weights   04/30/15 1605  Weight: 244 lb (110.678 kg)   Current Medications:   Current Outpatient Prescriptions  Medication Sig Dispense Refill  . aspirin 81 MG tablet Take 81 mg by mouth daily.    . Blood Glucose Monitoring Suppl (TRUETRACK BLOOD GLUCOSE) W/DEVICE KIT by Does not apply route.      . carvedilol (COREG) 3.125 MG tablet TAKE 1 TABLET BY MOUTH 2 TIMES DAILY WITH A MEAL 60 tablet 11  . Efinaconazole 10 % SOLN Apply 1 drop topically daily. 4 mL 11  . glimepiride (AMARYL) 4 MG tablet TAKE 1 TABLET BY MOUTH TWICE DAILY 60 tablet 11  . hydrochlorothiazide (HYDRODIURIL) 25 MG tablet TAKE 1 TABLET BY MOUTH DAILY. 90 tablet 5  . INVOKANA  100 MG TABS tablet TAKE 1 TABLET BY MOUTH DAILY. 30 tablet 2  . JANUVIA 100 MG tablet TAKE 1 TABLET BY MOUTH DAILY. 30 tablet 11  . Lancet Device MISC by Does not apply route. Use to test blood sugar three times daily     . meclizine (ANTIVERT) 25 MG tablet Take 1 tablet (25 mg total) by mouth 3 (three) times daily as needed for dizziness. 30 tablet 0  . meloxicam (MOBIC) 15 MG tablet TAKE 1 TABLET BY MOUTH DAILY AS NEEDED FOR PAIN. 30 tablet 1  . metFORMIN (GLUCOPHAGE-XR) 500 MG 24 hr tablet TAKE 2 TABLETS BY MOUTH TWICE DAILY AND 1 TABLET AT NOON 150 tablet 3  . pantoprazole (PROTONIX) 40 MG tablet TAKE 1 TABLET BY MOUTH DAILY 30 tablet 5  . simvastatin (ZOCOR) 20 MG tablet TAKE 1 TABLET BY MOUTH AT BEDTIME. 90 tablet 5  . traMADol (ULTRAM) 50 MG tablet Take 2 tablets (100 mg total) by mouth 2 (two) times daily as needed. 60 tablet 0  . TRUETEST TEST test strip USE TO TEST BLOOD SUGAR THREE TIMES A DAY 100 each 3  . WELCHOL 3.75 G PACK TAKE 1 PACKET BY MOUTH DAILY. 30 each 2  . albuterol (PROVENTIL HFA;VENTOLIN HFA) 108 (90 BASE) MCG/ACT inhaler Inhale 2 puffs into the lungs every 6 (six) hours as needed for wheezing or shortness of breath. (Patient not taking: Reported on 04/16/2015) 1 Inhaler 3  . ALPRAZolam Duanne Moron)  0.25 MG tablet Take 1 tablet (0.25 mg total) by mouth 2 (two) times daily as needed for anxiety (palpitations). (Patient not taking: Reported on 04/16/2015) 20 tablet 0  . cyclobenzaprine (FLEXERIL) 10 MG tablet Take 1 tablet (10 mg total) by mouth at bedtime as needed for muscle spasms. (Patient not taking: Reported on 04/30/2015) 30 tablet 1  . INVOKANA 100 MG TABS tablet TAKE 1 TABLET BY MOUTH DAILY. 30 tablet 2  . INVOKANA 100 MG TABS tablet TAKE 1 TABLET BY MOUTH DAILY. 30 tablet 5  . Multiple Vitamin (MULTIVITAMIN) tablet Take 1 tablet by mouth daily.      . nitroGLYCERIN (NITROSTAT) 0.4 MG SL tablet Place 1 tablet (0.4 mg total) under the tongue every 5 (five) minutes as needed  for chest pain. (Patient not taking: Reported on 04/30/2015) 25 tablet 3  . Tavaborole (KERYDIN) 5 % SOLN Apply 1 drop topically 1 day or 1 dose. Apply 1 drop to the toenail daily. (Patient not taking: Reported on 04/30/2015) 1 Bottle 24   No current facility-administered medications for this visit.    Functional Status:   In your present state of health, do you have any difficulty performing the following activities: 04/30/2015 01/22/2015  Hearing? N N  Vision? N N  Difficulty concentrating or making decisions? N N  Walking or climbing stairs? N N  Dressing or bathing? N -  Doing errands, shopping? N N    Fall/Depression Screening:    PHQ 2/9 Scores 04/30/2015 01/22/2015  PHQ - 2 Score 0 0   THN CM Care Plan Problem One        Patient Outreach from 04/30/2015 in [place] Problem One  Type II DM not meeting A1C target of 7.0% but with ongoing improvement in glycemic control as evidenced by A1C= 7.3 on 04/16/15 , previous A1C= 7.6% on 12/11/14   Care Plan for Problem One  Active   THN Long Term Goal (31-90 days)  Improved glycemic control as evidenced by improved A1C,  A1C < 7.5% at next check   [facility] Long Term Goal Start Date  04/30/15   Interventions for Problem One Long Term Goal  congratulated [place] again on improving her A1C, discussed strategies to continue to improve glycemic control so that she can meet A1C target of <7.0%, reviewed weight loss strategies as she continues to gain weight, reviewed results of her lipid profile drawn on 04/16/15 and congratulated her on improved medication adherence with Welchol and improved lipid profile, discussed strategies to reduce her triglycerides and provided an informational handout, arranged for Link To Wellness follow up in November       Assessment:   [employer] employee and Link To Wellness member showing improvement in glycemic and hyperlipidemia control as evidenced by recent labs. She is meeting HTN treatment  goals.  Plan: RNCM to fax today's office visit note to Dr. Randel Pigg. RNCM will meet quarterly and as needed with patient per Link To Wellness program guidelines to assist with Type II DM, HTN and hyperlipidemia self-management and assess patient's progress toward mutually set goals.  Barrington Ellison RN,CCM,CDE [place] Management Coordinator Link To Wellness Office Phone [phone] Office Fax [phone]

## 2015-05-05 ENCOUNTER — Other Ambulatory Visit: Payer: Self-pay | Admitting: Family Medicine

## 2015-05-05 NOTE — Telephone Encounter (Signed)
Faxed hardcopy for Tramadol to [facility].

## 2015-05-05 NOTE — Telephone Encounter (Signed)
Requesting: Tramadol Contract   Signed 11/06/14 UDS    Low risk Last OV   7/.28/16 Last Refill   04/02/15  #60 with 0 refills  Please Advise

## 2015-05-29 ENCOUNTER — Telehealth: Payer: Self-pay | Admitting: Family Medicine

## 2015-05-29 NOTE — Telephone Encounter (Signed)
Relation to pt: self Call back number: [phone]   Reason for call:  [employer] faxed over [facility] paper. Patient would like to know if received. Patient states they faxed over on the 05/26/15.

## 2015-06-01 DIAGNOSIS — Z0279 Encounter for issue of other medical certificate: Secondary | ICD-10-CM

## 2015-06-01 NOTE — Telephone Encounter (Signed)
Forms completed as much as possible and forwarded to Dr. Charlett Blake. JG//CMA

## 2015-06-01 NOTE — Telephone Encounter (Signed)
Forms received via fax on 05/30/2015

## 2015-06-02 NOTE — Telephone Encounter (Signed)
Forms faxed to [employer] successfully at [phone]. [employer] placed up front for pt. Copy sent for scanning. JG//CMA

## 2015-06-09 NOTE — Telephone Encounter (Signed)
Pt called stating [employer] sent her papers we filled out stating they were incomplete. Please f/u with pt [phone].

## 2015-06-11 NOTE — Telephone Encounter (Signed)
Pt calling again about paperwork that is incomplete per [employer]. She states that she did not get return call from 9/22. She said it has to be submitted 9/26 or she will be denied. Please f/u with pt [phone] today.

## 2015-06-12 NOTE — Telephone Encounter (Signed)
Returned pt's call. When the forms were originally filled out on 06/02/15, we put Diabetes, Hypertension and foot pain on one form. We received a fax back from [employer] stating that only one condition can be on the form. The form was then filled out for Diabetes and faxed back to [employer] on 06/03/15. Pt stated that she wanted to let us know that we will be receiving 2 additional forms (one for HTN and one for foot pain). I advised pt that I would be on the look out for these and get them sent in once we receive them. Pt voiced understanding and not further comments/questions/concerns. JG//CMA

## 2015-06-15 ENCOUNTER — Other Ambulatory Visit: Payer: Self-pay | Admitting: Family Medicine

## 2015-06-15 NOTE — Telephone Encounter (Signed)
Requesting: tramadol Contract  10/27/14 UDS   Low Risk Last OV   04/16/15 Last Refill   #60 with 0 refills 05/05/15  Please Advise

## 2015-06-15 NOTE — Telephone Encounter (Signed)
Faxed hardcopy for Tramadol to [place]

## 2015-06-19 ENCOUNTER — Ambulatory Visit: Payer: 59 | Admitting: Podiatry

## 2015-07-22 ENCOUNTER — Other Ambulatory Visit: Payer: Self-pay | Admitting: Family Medicine

## 2015-07-30 ENCOUNTER — Ambulatory Visit: Payer: Self-pay | Admitting: *Deleted

## 2015-08-06 ENCOUNTER — Encounter: Payer: Self-pay | Admitting: *Deleted

## 2015-08-06 ENCOUNTER — Other Ambulatory Visit: Payer: Self-pay | Admitting: *Deleted

## 2015-08-06 VITALS — BP 112/80 | Ht 64.0 in | Wt 237.8 lb

## 2015-08-06 DIAGNOSIS — E119 Type 2 diabetes mellitus without complications: Secondary | ICD-10-CM

## 2015-08-06 LAB — POCT GLYCOSYLATED HEMOGLOBIN (HGB A1C): Hemoglobin A1C: 8.3

## 2015-08-06 NOTE — Patient Outreach (Addendum)
[facility]) Care Management   08/06/2015  Eileen Richards Jun 10, 1961 [id number]  Eileen Richards is an 54 y.o. female who presents to the [place] [facility] office for routine Link To Wellness office visit for self management assistance with Type II DM, HTN and hyperlipidemia.  Subjective:  Shakera states she has experienced the death of her paternal grandmother, her great aunt and her dog of 16 years all in the last two months. 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 She says her plantar fasciitis continues to bother her and now she has a new problem of pain and stiffness in her neck and shoulders. She denies fever, chills or changes in appetite. She is requesting a POC Hgb A1C test as she says she is struggling to take her medications as prescribed due to her work schedule and the need to separate her other medications from her Welchol administration time by 4 hours.  Objective:   Review of Systems  Constitutional: Negative.     Physical Exam  Constitutional: She is oriented to person, place, and time. She appears well-developed and well-nourished.  Respiratory: Effort normal.  Neurological: She is alert and oriented to person, place, and time.  Skin: Skin is warm and dry.  Psychiatric: She has a normal mood and affect. Her behavior is normal. Judgment and thought content normal.   Filed Weights   08/06/15 1515  Weight: 237 lb 12.8 oz (107.865 kg)   Filed Vitals:   08/06/15 1515  BP: 112/80  POC Hgb A1C= 8.3%  Current Medications:   Current Outpatient Prescriptions  Medication Sig Dispense Refill  . aspirin 81 MG tablet Take 81 mg by mouth daily.    . carvedilol (COREG) 3.125 MG tablet TAKE 1 TABLET BY MOUTH 2 TIMES DAILY WITH A MEAL 60 tablet 11  . cyclobenzaprine (FLEXERIL) 10 MG tablet Take 1 tablet (10 mg total) by mouth at bedtime as needed for muscle spasms. 30 tablet 1  . Efinaconazole 10 % SOLN Apply 1 drop  topically daily. 4 mL 11  . glimepiride (AMARYL) 4 MG tablet TAKE 1 TABLET BY MOUTH TWICE DAILY 60 tablet 11  . hydrochlorothiazide (HYDRODIURIL) 25 MG tablet TAKE 1 TABLET BY MOUTH DAILY. 90 tablet 5  . INVOKANA 100 MG TABS tablet TAKE 1 TABLET BY MOUTH DAILY. 30 tablet 2  . JANUVIA 100 MG tablet TAKE 1 TABLET BY MOUTH DAILY. 30 tablet 11  . Lancet Device MISC by Does not apply route. Use to test blood sugar three times daily     . meloxicam (MOBIC) 15 MG tablet TAKE 1 TABLET BY MOUTH DAILY AS NEEDED FOR PAIN. 30 tablet 1  . metFORMIN (GLUCOPHAGE-XR) 500 MG 24 hr tablet TAKE 2 TABLETS BY MOUTH TWICE DAILY AND 1 TABLET AT NOON 150 tablet 2  . Multiple Vitamin (MULTIVITAMIN) tablet Take 1 tablet by mouth daily.      . pantoprazole (PROTONIX) 40 MG tablet TAKE 1 TABLET BY MOUTH DAILY 30 tablet 5  . simvastatin (ZOCOR) 20 MG tablet TAKE 1 TABLET BY MOUTH AT BEDTIME. 90 tablet 5  . traMADol (ULTRAM) 50 MG tablet TAKE 2 TABLETS BY MOUTH TWICE DAILY AS NEEDED 60 tablet 0  . TRUETEST TEST test strip USE TO TEST BLOOD SUGAR THREE TIMES A DAY 100 each 11  . WELCHOL 3.75 G PACK TAKE 1 PACKET BY MOUTH DAILY. 30 each 11  . albuterol (PROVENTIL HFA;VENTOLIN HFA) 108 (90 BASE) MCG/ACT inhaler Inhale 2 puffs into the  lungs every 6 (six) hours as needed for wheezing or shortness of breath. (Patient not taking: Reported on 04/16/2015) 1 Inhaler 3  . Blood Glucose Monitoring Suppl (TRUETRACK BLOOD GLUCOSE) W/DEVICE KIT by Does not apply route.      . INVOKANA 100 MG TABS tablet TAKE 1 TABLET BY MOUTH DAILY. 30 tablet 2  . Tavaborole (KERYDIN) 5 % SOLN Apply 1 drop topically 1 day or 1 dose. Apply 1 drop to the toenail daily. (Patient not taking: Reported on 04/30/2015) 1 Bottle 24   No current facility-administered medications for this visit.    Functional Status:   In your present state of health, do you have any difficulty performing the following activities: 08/06/2015 04/30/2015  Hearing? N N  Vision? N N   Difficulty concentrating or making decisions? N N  Walking or climbing stairs? N N  Dressing or bathing? N N  Doing errands, shopping? N N    Fall/Depression Screening:    PHQ 2/9 Scores 04/30/2015 01/22/2015  PHQ - 2 Score 0 0    Assessment:    employee and Link To Wellness member with Type II DM, HTN and hyperlipidemia meeting treatment targets for HTN but POC Hgb A1C on 08/06/15 increased to 8.3% (prior Hgb A1C=7.3 on 04/16/15  and triglycerides elevated at 176 on 04/16/15.  Plan:  [facility] CM Care Plan Problem One        Most Recent Value   Care Plan Problem One  Type II DM not meeting Hgb A1C target of 7.0% and with worsening glycemic control as evidenced by POC Hgb A1C= 8.3% on 08/06/15, Hgb A1C  was 7.3% on 04/16/15 , previous A1C= 7.6% on 12/11/14, BP meeting targets, triglycerides elevated at 176 when assessed on 04/16/15    Role Documenting the Problem One  Care Management [place] for Problem One  Active   THN Long Term Goal (31-90 days)  Improved glycemic control as evidenced by improved Hgb A1C,  with Hgb A1C < 7.5% at next check, BP readings consistently meeting target of <140/<90, and normal lipid profile at  next assessment   THN Long Term Goal Start Date  04/30/15   Interventions for Problem One Long Term Goal  reviewed medications, explored reasons that contributed to increased Hgb A1C,  discussed strategies to improve medication adherence and improve  glycemic control and triglycerides, will make referral to pharmacist for suggestions/ input to improve medication compliance and/or simplify medication regimen,  issued True Metrix glucometer and starter kit and demonstrated to Dareen how to use new meter with appropriate teach back from patient, will arrange for Link To Wellness follow up after Emina sees Dr. Charlett Blake in February       RNCM to fax today's office visit note to Dr. Randel Pigg. RNCM will meet quarterly and as needed with patient per Link To Wellness  program guidelines to assist with Type II DM self-management and assess patient's progress toward mutually set goals. Cautioned Sairah about change in air quality to [place] in [place] on 11[place]18 due to [place];  reviewed the asthma action plan and ensured she has her rescue inhaler with adequate medication. Barrington Ellison RN,CCM,CDE [place] Management Coordinator Link To Wellness Office Phone [phone] Office Fax [phone]

## 2015-08-17 ENCOUNTER — Other Ambulatory Visit: Payer: Self-pay | Admitting: Family Medicine

## 2015-08-17 NOTE — Telephone Encounter (Signed)
Faxed hardcopy for Tramadol to [facility].

## 2015-08-17 NOTE — Telephone Encounter (Signed)
Requesting:  Tramadol Contract  10/27/2014 UDS Low Risk Last OV   04/16/2015 Last Refill   #60 06/15/2015  Please Advise

## 2015-08-24 ENCOUNTER — Other Ambulatory Visit: Payer: Self-pay | Admitting: Pharmacist

## 2015-08-24 NOTE — Patient Outreach (Signed)
[facility] [facility]) Care Management  [place]   08/24/2015  Ashleigh Luckow November 04, 1960 [id number]  Subjective: Eileen Richards is a 54 y.o. female who was referred to [place] for a medication review.   Objective:   Current Medications: Current Outpatient Prescriptions  Medication Sig Dispense Refill  . albuterol (PROVENTIL HFA;VENTOLIN HFA) 108 (90 BASE) MCG/ACT inhaler Inhale 2 puffs into the lungs every 6 (six) hours as needed for wheezing or shortness of breath. (Patient not taking: Reported on 04/16/2015) 1 Inhaler 3  . ALPRAZolam (XANAX) 0.25 MG tablet Take 1 tablet (0.25 mg total) by mouth 2 (two) times daily as needed for anxiety (palpitations). (Patient not taking: Reported on 04/16/2015) 20 tablet 0  . aspirin 81 MG tablet Take 81 mg by mouth daily.    . Blood Glucose Monitoring Suppl (TRUETRACK BLOOD GLUCOSE) W/DEVICE KIT by Does not apply route.      . carvedilol (COREG) 3.125 MG tablet TAKE 1 TABLET BY MOUTH 2 TIMES DAILY WITH A MEAL 60 tablet 11  . cyclobenzaprine (FLEXERIL) 10 MG tablet Take 1 tablet (10 mg total) by mouth at bedtime as needed for muscle spasms. 30 tablet 1  . Efinaconazole 10 % SOLN Apply 1 drop topically daily. 4 mL 11  . glimepiride (AMARYL) 4 MG tablet TAKE 1 TABLET BY MOUTH TWICE DAILY 60 tablet 2  . hydrochlorothiazide (HYDRODIURIL) 25 MG tablet TAKE 1 TABLET BY MOUTH DAILY. 90 tablet 5  . INVOKANA 100 MG TABS tablet TAKE 1 TABLET BY MOUTH DAILY. 30 tablet 2  . INVOKANA 100 MG TABS tablet TAKE 1 TABLET BY MOUTH DAILY. 30 tablet 2  . INVOKANA 100 MG TABS tablet TAKE 1 TABLET BY MOUTH DAILY. 30 tablet 5  . JANUVIA 100 MG tablet TAKE 1 TABLET BY MOUTH DAILY. 30 tablet 11  . Lancet Device MISC by Does not apply route. Use to test blood sugar three times daily     . meclizine (ANTIVERT) 25 MG tablet Take 1 tablet (25 mg total) by mouth 3 (three) times daily as needed for dizziness. (Patient not taking: Reported on 08/06/2015) 30  tablet 0  . meloxicam (MOBIC) 15 MG tablet TAKE 1 TABLET BY MOUTH DAILY AS NEEDED FOR PAIN. 30 tablet 0  . metFORMIN (GLUCOPHAGE-XR) 500 MG 24 hr tablet TAKE 2 TABLETS BY MOUTH TWICE DAILY AND 1 TABLET AT NOON 150 tablet 2  . Multiple Vitamin (MULTIVITAMIN) tablet Take 1 tablet by mouth daily.      . nitroGLYCERIN (NITROSTAT) 0.4 MG SL tablet Place 1 tablet (0.4 mg total) under the tongue every 5 (five) minutes as needed for chest pain. (Patient not taking: Reported on 04/30/2015) 25 tablet 3  . pantoprazole (PROTONIX) 40 MG tablet TAKE 1 TABLET BY MOUTH DAILY 30 tablet 5  . simvastatin (ZOCOR) 20 MG tablet TAKE 1 TABLET BY MOUTH EVERY NIGHT AT BEDTIME 90 tablet 2  . Tavaborole (KERYDIN) 5 % SOLN Apply 1 drop topically 1 day or 1 dose. Apply 1 drop to the toenail daily. (Patient not taking: Reported on 04/30/2015) 1 Bottle 24  . traMADol (ULTRAM) 50 MG tablet TAKE 2 TABLETS BY MOUTH TWICE DAILY AS NEEDED 60 tablet PRN  . TRUETEST TEST test strip USE TO TEST BLOOD SUGAR THREE TIMES A DAY 100 each 11  . WELCHOL 3.75 G PACK TAKE 1 PACKET BY MOUTH DAILY. 30 each 11   No current facility-administered medications for this visit.    Functional Status: In your present state of  health, do you have any difficulty performing the following activities: 08/06/2015 04/30/2015  Hearing? N N  Vision? N N  Difficulty concentrating or making decisions? N N  Walking or climbing stairs? N N  Dressing or bathing? N N  Doing errands, shopping? N N    Fall/Depression Screening: PHQ 2/9 Scores 04/30/2015 01/22/2015  PHQ - 2 Score 0 0    Assessment: Drugs sorted by system:  Neurologic/Psychologic: meclizine (for dizziness)  Cardiovascular: aspirin, carvedilol, hydrochlorothiazide, nitroglycerin, simvastatin, Welchol  Pulmonary/Allergy: albuterol  Gastrointestinal: pantoprazole  Endocrine: glimepiride, canagliflozin, sitagliptin, metformin  Renal: none   Infectious Diseases: none  Topical:  efinaconazole  Pain: cyclobenzaprine, meloxicam, tramadol  Vitamins/Minerals: MVI  Miscellaneous: none  Findings:  Duplications in therapy: none  Gaps in therapy:  Medications to avoid in the elderly: patient <65  Drug interactions: Welchol can decrease the serum concentration of glimepiride, hydrochlorothiazide, and meloxicam.  Inappropriate dose: none noted  Other issues noted: none   Plan: 1. Medication review: will contact Dr. Charlett Blake and see if it would be reasonable to switch patient to a more potent statin (atorvastatin is on the Link to Wellness formulary) and then discontinue the Welchol. By stopping the Welchol and not having to separate her medications, it would likely improve her compliance. Will let Kelli Churn know of my plan.    Nicoletta Ba, PharmD, [facility] [facility] [phone]

## 2015-08-25 ENCOUNTER — Other Ambulatory Visit: Payer: Self-pay | Admitting: Family Medicine

## 2015-08-25 ENCOUNTER — Other Ambulatory Visit: Payer: Self-pay | Admitting: Pharmacist

## 2015-08-25 ENCOUNTER — Telehealth: Payer: Self-pay | Admitting: Family Medicine

## 2015-08-25 MED ORDER — ATORVASTATIN CALCIUM 20 MG PO TABS: 20.0000 mg | ORAL_TABLET | Freq: Every day | ORAL | Status: DC

## 2015-08-25 NOTE — Patient Outreach (Signed)
I called the patient to discuss stopping the simvastatin and Welchol and to start atorvastatin as prescribed by Dr. Charlett Blake and I had to leave a HIPAA compliant message for patient to return my phone call.  I will reach out on 08/28/15 if the patient does not return my call.  Nicoletta Ba, PharmD, [place] [phone]   .

## 2015-08-25 NOTE — Patient Outreach (Signed)
Patient returned my call. We discussed stopping the simvastatin and Welchol and to start atorvastatin as prescribed by Dr. Charlett Blake. Patient verbalized understanding. I will call her in 2 weeks to see how she is doing with the medication change.   Nicoletta Ba, PharmD, [place] [phone]   .

## 2015-08-25 NOTE — Telephone Encounter (Signed)
Caller name: Harshini   Relationship to patient: Self  Can be reached: [phone]   Reason for call: Pt says that she was notified by the pharmacy of a change in her medication. She wasn't aware of any changes.  It's showing a change on her atorvastatin. Please call back to advise further.

## 2015-08-25 NOTE — Telephone Encounter (Signed)
I thought patient knew of the advised change, pharmacy called and recommended the switch from Simvastatin to Atorvastatin since it is slightly stronger we can do away with the welchol and hopefully see an improvement in her cholesterol numbers. If she is unhappy with the cahange we can always switch backr

## 2015-08-26 NOTE — Telephone Encounter (Signed)
Spoke to patient. She says that she spoke to someone yesterday regarding Dr. Frederik Pear note below.  No further needs voiced at this time.

## 2015-09-07 ENCOUNTER — Other Ambulatory Visit: Payer: Self-pay | Admitting: Pharmacist

## 2015-09-07 NOTE — Patient Outreach (Signed)
[facility]) Care Management  [place]   09/07/2015  Eileen Richards 06/09/1961 [id number]  Subjective: Eileen Richards is a 54 y.o. female who was referred to Pharmacy for assistance with medications. She was switched from simvastatin and Welchol to atorvastatin after discussion with Dr. Charlett Blake.  I called today to follow up and make sure that the patient was doing well with the change. I was unable to reach her and had to leave a HIPAA-compliant voicemail for patient to call me back with any questions or concerns. Otherwise, she can follow up with Dr. Charlett Blake and Kelli Churn at [facility]. Will close pharmacy case.  Nicoletta Ba, PharmD, [place] [facility] [phone]

## 2015-09-08 ENCOUNTER — Telehealth: Payer: Self-pay | Admitting: Behavioral Health

## 2015-09-08 NOTE — Telephone Encounter (Signed)
The following gaps in care were assessed: A1c less than 8% Mammogram Colonoscopy  Patient is scheduled to have an annual physical with PCP, 11/10/15.

## 2015-10-05 ENCOUNTER — Other Ambulatory Visit: Payer: Self-pay | Admitting: Family Medicine

## 2015-10-05 MED FILL — traMADol HCL 50 MG TABS: 50 | 15 days supply | Qty: 60 | Fill #1

## 2015-10-05 MED FILL — CARVEDILOL 3.125 MG TABLET: 3.125 | 30 days supply | Qty: 60 | Fill #7

## 2015-10-05 MED FILL — METFORMIN HCL ER 500 MG TAB: 500 | 30 days supply | Qty: 150 | Fill #2

## 2015-10-05 MED FILL — JANUVIA 100 MG TABLET: 100 | 30 days supply | Qty: 30 | Fill #9

## 2015-10-05 MED FILL — MELOXICAM 15 MG TABLET: 15 | 30 days supply | Qty: 30 | Fill #0

## 2015-11-03 DIAGNOSIS — H524 Presbyopia: Secondary | ICD-10-CM | POA: Diagnosis not present

## 2015-11-03 DIAGNOSIS — H35033 Hypertensive retinopathy, bilateral: Secondary | ICD-10-CM | POA: Diagnosis not present

## 2015-11-03 DIAGNOSIS — H52223 Regular astigmatism, bilateral: Secondary | ICD-10-CM | POA: Diagnosis not present

## 2015-11-03 DIAGNOSIS — I10 Essential (primary) hypertension: Secondary | ICD-10-CM | POA: Diagnosis not present

## 2015-11-03 DIAGNOSIS — H5203 Hypermetropia, bilateral: Secondary | ICD-10-CM | POA: Diagnosis not present

## 2015-11-03 DIAGNOSIS — H2513 Age-related nuclear cataract, bilateral: Secondary | ICD-10-CM | POA: Diagnosis not present

## 2015-11-09 ENCOUNTER — Other Ambulatory Visit: Payer: Self-pay | Admitting: Family Medicine

## 2015-11-10 ENCOUNTER — Encounter: Payer: Self-pay | Admitting: Family Medicine

## 2015-11-10 ENCOUNTER — Ambulatory Visit (INDEPENDENT_AMBULATORY_CARE_PROVIDER_SITE_OTHER): Payer: 59 | Admitting: Family Medicine

## 2015-11-10 VITALS — BP 123/67 | HR 82 | Temp 98.0°F | Ht 64.0 in | Wt 239.2 lb

## 2015-11-10 DIAGNOSIS — M25561 Pain in right knee: Secondary | ICD-10-CM

## 2015-11-10 DIAGNOSIS — B351 Tinea unguium: Secondary | ICD-10-CM

## 2015-11-10 DIAGNOSIS — E1169 Type 2 diabetes mellitus with other specified complication: Secondary | ICD-10-CM

## 2015-11-10 DIAGNOSIS — I10 Essential (primary) hypertension: Secondary | ICD-10-CM

## 2015-11-10 DIAGNOSIS — M25562 Pain in left knee: Secondary | ICD-10-CM

## 2015-11-10 DIAGNOSIS — E119 Type 2 diabetes mellitus without complications: Secondary | ICD-10-CM

## 2015-11-10 DIAGNOSIS — E669 Obesity, unspecified: Secondary | ICD-10-CM

## 2015-11-10 DIAGNOSIS — M79642 Pain in left hand: Secondary | ICD-10-CM | POA: Diagnosis not present

## 2015-11-10 DIAGNOSIS — Z0000 Encounter for general adult medical examination without abnormal findings: Secondary | ICD-10-CM

## 2015-11-10 DIAGNOSIS — K219 Gastro-esophageal reflux disease without esophagitis: Secondary | ICD-10-CM

## 2015-11-10 DIAGNOSIS — E782 Mixed hyperlipidemia: Secondary | ICD-10-CM

## 2015-11-10 HISTORY — DX: Pain in left hand: M79.642

## 2015-11-10 MED ORDER — NAPROXEN 500 MG PO TABS: 500.0000 mg | ORAL_TABLET | Freq: Two times a day (BID) | ORAL | Status: DC | PRN

## 2015-11-10 MED FILL — NAPROXEN 500 MG TABLET: 500 | 30 days supply | Qty: 60 | Fill #0

## 2015-11-10 MED FILL — JANUVIA 100 MG TABLET: 100 | 90 days supply | Qty: 90 | Fill #0

## 2015-11-10 MED FILL — CARVEDILOL 3.125 MG TABLET: 3.125 | 90 days supply | Qty: 180 | Fill #0

## 2015-11-10 NOTE — Progress Notes (Signed)
Pre visit review using our clinic review tool, if applicable. No additional management support is needed unless otherwise documented below in the visit note. 

## 2015-11-10 NOTE — Patient Instructions (Signed)
Preventive Care for Adults, Female A healthy lifestyle and preventive care can promote health and wellness. Preventive health guidelines for women include the following key practices.  A routine yearly physical is a good way to check with your health care provider about your health and preventive screening. It is a chance to share any concerns and updates on your health and to receive a thorough exam.  Visit your dentist for a routine exam and preventive care every 6 months. Brush your teeth twice a day and floss once a day. Good oral hygiene prevents tooth decay and gum disease.  The frequency of eye exams is based on your age, health, family medical history, use of contact lenses, and other factors. Follow your health care provider's recommendations for frequency of eye exams.  Eat a healthy diet. Foods like vegetables, fruits, whole grains, low-fat dairy products, and lean protein foods contain the nutrients you need without too many calories. Decrease your intake of foods high in solid fats, added sugars, and salt. Eat the right amount of calories for you.Get information about a proper diet from your health care provider, if necessary.  Regular physical exercise is one of the most important things you can do for your health. Most adults should get at least 150 minutes of moderate-intensity exercise (any activity that increases your heart rate and causes you to sweat) each week. In addition, most adults need muscle-strengthening exercises on 2 or more days a week.  Maintain a healthy weight. The body mass index (BMI) is a screening tool to identify possible weight problems. It provides an estimate of body fat based on height and weight. Your health care provider can find your BMI and can help you achieve or maintain a healthy weight.For adults 20 years and older:  A BMI below 18.5 is considered underweight.  A BMI of 18.5 to 24.9 is normal.  A BMI of 25 to 29.9 is considered overweight.  A  BMI of 30 and above is considered obese.  Maintain normal blood lipids and cholesterol levels by exercising and minimizing your intake of saturated fat. Eat a balanced diet with plenty of fruit and vegetables. Blood tests for lipids and cholesterol should begin at age 45 and be repeated every 5 years. If your lipid or cholesterol levels are high, you are over 50, or you are at high risk for heart disease, you may need your cholesterol levels checked more frequently.Ongoing high lipid and cholesterol levels should be treated with medicines if diet and exercise are not working.  If you smoke, find out from your health care provider how to quit. If you do not use tobacco, do not start.  Lung cancer screening is recommended for adults aged 45-80 years who are at high risk for developing lung cancer because of a history of smoking. A yearly low-dose CT scan of the lungs is recommended for people who have at least a 30-pack-year history of smoking and are a current smoker or have quit within the past 15 years. A pack year of smoking is smoking an average of 1 pack of cigarettes a day for 1 year (for example: 1 pack a day for 30 years or 2 packs a day for 15 years). Yearly screening should continue until the smoker has stopped smoking for at least 15 years. Yearly screening should be stopped for people who develop a health problem that would prevent them from having lung cancer treatment.  If you are pregnant, do not drink alcohol. If you are  breastfeeding, be very cautious about drinking alcohol. If you are not pregnant and choose to drink alcohol, do not have more than 1 drink per day. One drink is considered to be 12 ounces (355 mL) of beer, 5 ounces (148 mL) of wine, or 1.5 ounces (44 mL) of liquor.  Avoid use of street drugs. Do not share needles with anyone. Ask for help if you need support or instructions about stopping the use of drugs.  High blood pressure causes heart disease and increases the risk  of stroke. Your blood pressure should be checked at least every 1 to 2 years. Ongoing high blood pressure should be treated with medicines if weight loss and exercise do not work.  If you are 55-79 years old, ask your health care provider if you should take aspirin to prevent strokes.  Diabetes screening is done by taking a blood sample to check your blood glucose level after you have not eaten for a certain period of time (fasting). If you are not overweight and you do not have risk factors for diabetes, you should be screened once every 3 years starting at age 45. If you are overweight or obese and you are 40-70 years of age, you should be screened for diabetes every year as part of your cardiovascular risk assessment.  Breast cancer screening is essential preventive care for women. You should practice "breast self-awareness." This means understanding the normal appearance and feel of your breasts and may include breast self-examination. Any changes detected, no matter how small, should be reported to a health care provider. Women in their 20s and 30s should have a clinical breast exam (CBE) by a health care provider as part of a regular health exam every 1 to 3 years. After age 40, women should have a CBE every year. Starting at age 40, women should consider having a mammogram (breast X-ray test) every year. Women who have a family history of breast cancer should talk to their health care provider about genetic screening. Women at a high risk of breast cancer should talk to their health care providers about having an MRI and a mammogram every year.  Breast cancer gene (BRCA)-related cancer risk assessment is recommended for women who have family members with BRCA-related cancers. BRCA-related cancers include breast, ovarian, tubal, and peritoneal cancers. Having family members with these cancers may be associated with an increased risk for harmful changes (mutations) in the breast cancer genes BRCA1 and  BRCA2. Results of the assessment will determine the need for genetic counseling and BRCA1 and BRCA2 testing.  Your health care provider may recommend that you be screened regularly for cancer of the pelvic organs (ovaries, uterus, and vagina). This screening involves a pelvic examination, including checking for microscopic changes to the surface of your cervix (Pap test). You may be encouraged to have this screening done every 3 years, beginning at age 21.  For women ages 30-65, health care providers may recommend pelvic exams and Pap testing every 3 years, or they may recommend the Pap and pelvic exam, combined with testing for human papilloma virus (HPV), every 5 years. Some types of HPV increase your risk of cervical cancer. Testing for HPV may also be done on women of any age with unclear Pap test results.  Other health care providers may not recommend any screening for nonpregnant women who are considered low risk for pelvic cancer and who do not have symptoms. Ask your health care provider if a screening pelvic exam is right for   you.  If you have had past treatment for cervical cancer or a condition that could lead to cancer, you need Pap tests and screening for cancer for at least 20 years after your treatment. If Pap tests have been discontinued, your risk factors (such as having a new sexual partner) need to be reassessed to determine if screening should resume. Some women have medical problems that increase the chance of getting cervical cancer. In these cases, your health care provider may recommend more frequent screening and Pap tests.  Colorectal cancer can be detected and often prevented. Most routine colorectal cancer screening begins at the age of 50 years and continues through age 75 years. However, your health care provider may recommend screening at an earlier age if you have risk factors for colon cancer. On a yearly basis, your health care provider may provide home test kits to check  for hidden blood in the stool. Use of a small camera at the end of a tube, to directly examine the colon (sigmoidoscopy or colonoscopy), can detect the earliest forms of colorectal cancer. Talk to your health care provider about this at age 50, when routine screening begins. Direct exam of the colon should be repeated every 5-10 years through age 75 years, unless early forms of precancerous polyps or small growths are found.  People who are at an increased risk for hepatitis B should be screened for this virus. You are considered at high risk for hepatitis B if:  You were born in a country where hepatitis B occurs often. Talk with your health care provider about which countries are considered high risk.  Your parents were born in a high-risk country and you have not received a shot to protect against hepatitis B (hepatitis B vaccine).  You have HIV or AIDS.  You use needles to inject street drugs.  You live with, or have sex with, someone who has hepatitis B.  You get hemodialysis treatment.  You take certain medicines for conditions like cancer, organ transplantation, and autoimmune conditions.  Hepatitis C blood testing is recommended for all people born from 1945 through 1965 and any individual with known risks for hepatitis C.  Practice safe sex. Use condoms and avoid high-risk sexual practices to reduce the spread of sexually transmitted infections (STIs). STIs include gonorrhea, chlamydia, syphilis, trichomonas, herpes, HPV, and human immunodeficiency virus (HIV). Herpes, HIV, and HPV are viral illnesses that have no cure. They can result in disability, cancer, and death.  You should be screened for sexually transmitted illnesses (STIs) including gonorrhea and chlamydia if:  You are sexually active and are younger than 24 years.  You are older than 24 years and your health care provider tells you that you are at risk for this type of infection.  Your sexual activity has changed  since you were last screened and you are at an increased risk for chlamydia or gonorrhea. Ask your health care provider if you are at risk.  If you are at risk of being infected with HIV, it is recommended that you take a prescription medicine daily to prevent HIV infection. This is called preexposure prophylaxis (PrEP). You are considered at risk if:  You are sexually active and do not regularly use condoms or know the HIV status of your partner(s).  You take drugs by injection.  You are sexually active with a partner who has HIV.  Talk with your health care provider about whether you are at high risk of being infected with HIV. If   you choose to begin PrEP, you should first be tested for HIV. You should then be tested every 3 months for as long as you are taking PrEP.  Osteoporosis is a disease in which the bones lose minerals and strength with aging. This can result in serious bone fractures or breaks. The risk of osteoporosis can be identified using a bone density scan. Women ages 67 years and over and women at risk for fractures or osteoporosis should discuss screening with their health care providers. Ask your health care provider whether you should take a calcium supplement or vitamin D to reduce the rate of osteoporosis.  Menopause can be associated with physical symptoms and risks. Hormone replacement therapy is available to decrease symptoms and risks. You should talk to your health care provider about whether hormone replacement therapy is right for you.  Use sunscreen. Apply sunscreen liberally and repeatedly throughout the day. You should seek shade when your shadow is shorter than you. Protect yourself by wearing long sleeves, pants, a wide-brimmed hat, and sunglasses year round, whenever you are outdoors.  Once a month, do a whole body skin exam, using a mirror to look at the skin on your back. Tell your health care provider of new moles, moles that have irregular borders, moles that  are larger than a pencil eraser, or moles that have changed in shape or color.  Stay current with required vaccines (immunizations).  Influenza vaccine. All adults should be immunized every year.  Tetanus, diphtheria, and acellular pertussis (Td, Tdap) vaccine. Pregnant women should receive 1 dose of Tdap vaccine during each pregnancy. The dose should be obtained regardless of the length of time since the last dose. Immunization is preferred during the 27th-36th week of gestation. An adult who has not previously received Tdap or who does not know her vaccine status should receive 1 dose of Tdap. This initial dose should be followed by tetanus and diphtheria toxoids (Td) booster doses every 10 years. Adults with an unknown or incomplete history of completing a 3-dose immunization series with Td-containing vaccines should begin or complete a primary immunization series including a Tdap dose. Adults should receive a Td booster every 10 years.  Varicella vaccine. An adult without evidence of immunity to varicella should receive 2 doses or a second dose if she has previously received 1 dose. Pregnant females who do not have evidence of immunity should receive the first dose after pregnancy. This first dose should be obtained before leaving the health care facility. The second dose should be obtained 4-8 weeks after the first dose.  Human papillomavirus (HPV) vaccine. Females aged 13-26 years who have not received the vaccine previously should obtain the 3-dose series. The vaccine is not recommended for use in pregnant females. However, pregnancy testing is not needed before receiving a dose. If a female is found to be pregnant after receiving a dose, no treatment is needed. In that case, the remaining doses should be delayed until after the pregnancy. Immunization is recommended for any person with an immunocompromised condition through the age of 61 years if she did not get any or all doses earlier. During the  3-dose series, the second dose should be obtained 4-8 weeks after the first dose. The third dose should be obtained 24 weeks after the first dose and 16 weeks after the second dose.  Zoster vaccine. One dose is recommended for adults aged 30 years or older unless certain conditions are present.  Measles, mumps, and rubella (MMR) vaccine. Adults born  before 1957 generally are considered immune to measles and mumps. Adults born in 1957 or later should have 1 or more doses of MMR vaccine unless there is a contraindication to the vaccine or there is laboratory evidence of immunity to each of the three diseases. A routine second dose of MMR vaccine should be obtained at least 28 days after the first dose for students attending postsecondary schools, health care workers, or international travelers. People who received inactivated measles vaccine or an unknown type of measles vaccine during 1963-1967 should receive 2 doses of MMR vaccine. People who received inactivated mumps vaccine or an unknown type of mumps vaccine before 1979 and are at high risk for mumps infection should consider immunization with 2 doses of MMR vaccine. For females of childbearing age, rubella immunity should be determined. If there is no evidence of immunity, females who are not pregnant should be vaccinated. If there is no evidence of immunity, females who are pregnant should delay immunization until after pregnancy. Unvaccinated health care workers born before 1957 who lack laboratory evidence of measles, mumps, or rubella immunity or laboratory confirmation of disease should consider measles and mumps immunization with 2 doses of MMR vaccine or rubella immunization with 1 dose of MMR vaccine.  Pneumococcal 13-valent conjugate (PCV13) vaccine. When indicated, a person who is uncertain of his immunization history and has no record of immunization should receive the PCV13 vaccine. All adults 65 years of age and older should receive this  vaccine. An adult aged 19 years or older who has certain medical conditions and has not been previously immunized should receive 1 dose of PCV13 vaccine. This PCV13 should be followed with a dose of pneumococcal polysaccharide (PPSV23) vaccine. Adults who are at high risk for pneumococcal disease should obtain the PPSV23 vaccine at least 8 weeks after the dose of PCV13 vaccine. Adults older than 53 years of age who have normal immune system function should obtain the PPSV23 vaccine dose at least 1 year after the dose of PCV13 vaccine.  Pneumococcal polysaccharide (PPSV23) vaccine. When PCV13 is also indicated, PCV13 should be obtained first. All adults aged 65 years and older should be immunized. An adult younger than age 65 years who has certain medical conditions should be immunized. Any person who resides in a nursing home or long-term care facility should be immunized. An adult smoker should be immunized. People with an immunocompromised condition and certain other conditions should receive both PCV13 and PPSV23 vaccines. People with human immunodeficiency virus (HIV) infection should be immunized as soon as possible after diagnosis. Immunization during chemotherapy or radiation therapy should be avoided. Routine use of PPSV23 vaccine is not recommended for American Indians, [place] Natives, or people younger than 65 years unless there are medical conditions that require PPSV23 vaccine. When indicated, people who have unknown immunization and have no record of immunization should receive PPSV23 vaccine. One-time revaccination 5 years after the first dose of PPSV23 is recommended for people aged 19-64 years who have chronic kidney failure, nephrotic syndrome, asplenia, or immunocompromised conditions. People who received 1-2 doses of PPSV23 before age 65 years should receive another dose of PPSV23 vaccine at age 65 years or later if at least 5 years have passed since the previous dose. Doses of PPSV23 are not  needed for people immunized with PPSV23 at or after age 65 years.  Meningococcal vaccine. Adults with asplenia or persistent complement component deficiencies should receive 2 doses of quadrivalent meningococcal conjugate (MenACWY-D) vaccine. The doses should be obtained   at least 2 months apart. Microbiologists working with certain meningococcal bacteria, [place] recruits, people at risk during an outbreak, and people who travel to or live in countries with a high rate of meningitis should be immunized. A first-year college student up through age 34 years who is living in a residence hall should receive a dose if she did not receive a dose on or after her 16th birthday. Adults who have certain high-risk conditions should receive one or more doses of vaccine.  Hepatitis A vaccine. Adults who wish to be protected from this disease, have certain high-risk conditions, work with hepatitis A-infected animals, work in hepatitis A research labs, or travel to or work in countries with a high rate of hepatitis A should be immunized. Adults who were previously unvaccinated and who anticipate close contact with an international adoptee during the first 60 days after arrival in the [place] from a country with a high rate of hepatitis A should be immunized.  Hepatitis B vaccine. Adults who wish to be protected from this disease, have certain high-risk conditions, may be exposed to blood or other infectious body fluids, are household contacts or sex partners of hepatitis B positive people, are clients or workers in certain care facilities, or travel to or work in countries with a high rate of hepatitis B should be immunized.  Haemophilus influenzae type b (Hib) vaccine. A previously unvaccinated person with asplenia or sickle cell disease or having a scheduled splenectomy should receive 1 dose of Hib vaccine. Regardless of previous immunization, a recipient of a hematopoietic stem cell transplant should receive a  3-dose series 6-12 months after her successful transplant. Hib vaccine is not recommended for adults with HIV infection. Preventive Services / Frequency Ages 35 to 4 years  Blood pressure check.** / Every 3-5 years.  Lipid and cholesterol check.** / Every 5 years beginning at age 60.  Clinical breast exam.** / Every 3 years for women in their 71s and 10s.  BRCA-related cancer risk assessment.** / For women who have family members with a BRCA-related cancer (breast, ovarian, tubal, or peritoneal cancers).  Pap test.** / Every 2 years from ages 76 through 26. Every 3 years starting at age 61 through age 76 or 93 with a history of 3 consecutive normal Pap tests.  HPV screening.** / Every 3 years from ages 37 through ages 60 to 51 with a history of 3 consecutive normal Pap tests.  Hepatitis C blood test.** / For any individual with known risks for hepatitis C.  Skin self-exam. / Monthly.  Influenza vaccine. / Every year.  Tetanus, diphtheria, and acellular pertussis (Tdap, Td) vaccine.** / Consult your health care provider. Pregnant women should receive 1 dose of Tdap vaccine during each pregnancy. 1 dose of Td every 10 years.  Varicella vaccine.** / Consult your health care provider. Pregnant females who do not have evidence of immunity should receive the first dose after pregnancy.  HPV vaccine. / 3 doses over 6 months, if 93 and younger. The vaccine is not recommended for use in pregnant females. However, pregnancy testing is not needed before receiving a dose.  Measles, mumps, rubella (MMR) vaccine.** / You need at least 1 dose of MMR if you were born in 1957 or later. You may also need a 2nd dose. For females of childbearing age, rubella immunity should be determined. If there is no evidence of immunity, females who are not pregnant should be vaccinated. If there is no evidence of immunity, females who are  pregnant should delay immunization until after pregnancy.  Pneumococcal  13-valent conjugate (PCV13) vaccine.** / Consult your health care provider.  Pneumococcal polysaccharide (PPSV23) vaccine.** / 1 to 2 doses if you smoke cigarettes or if you have certain conditions.  Meningococcal vaccine.** / 1 dose if you are age 68 to 8 years and a Market researcher living in a residence hall, or have one of several medical conditions, you need to get vaccinated against meningococcal disease. You may also need additional booster doses.  Hepatitis A vaccine.** / Consult your health care provider.  Hepatitis B vaccine.** / Consult your health care provider.  Haemophilus influenzae type b (Hib) vaccine.** / Consult your health care provider. Ages 7 to 53 years  Blood pressure check.** / Every year.  Lipid and cholesterol check.** / Every 5 years beginning at age 25 years.  Lung cancer screening. / Every year if you are aged 11-80 years and have a 30-pack-year history of smoking and currently smoke or have quit within the past 15 years. Yearly screening is stopped once you have quit smoking for at least 15 years or develop a health problem that would prevent you from having lung cancer treatment.  Clinical breast exam.** / Every year after age 48 years.  BRCA-related cancer risk assessment.** / For women who have family members with a BRCA-related cancer (breast, ovarian, tubal, or peritoneal cancers).  Mammogram.** / Every year beginning at age 41 years and continuing for as long as you are in good health. Consult with your health care provider.  Pap test.** / Every 3 years starting at age 65 years through age 37 or 70 years with a history of 3 consecutive normal Pap tests.  HPV screening.** / Every 3 years from ages 72 years through ages 60 to 40 years with a history of 3 consecutive normal Pap tests.  Fecal occult blood test (FOBT) of stool. / Every year beginning at age 21 years and continuing until age 5 years. You may not need to do this test if you get  a colonoscopy every 10 years.  Flexible sigmoidoscopy or colonoscopy.** / Every 5 years for a flexible sigmoidoscopy or every 10 years for a colonoscopy beginning at age 35 years and continuing until age 48 years.  Hepatitis C blood test.** / For all people born from [birth year] through 1965 and any individual with known risks for hepatitis C.  Skin self-exam. / Monthly.  Influenza vaccine. / Every year.  Tetanus, diphtheria, and acellular pertussis (Tdap/Td) vaccine.** / Consult your health care provider. Pregnant women should receive 1 dose of Tdap vaccine during each pregnancy. 1 dose of Td every 10 years.  Varicella vaccine.** / Consult your health care provider. Pregnant females who do not have evidence of immunity should receive the first dose after pregnancy.  Zoster vaccine.** / 1 dose for adults aged 30 years or older.  Measles, mumps, rubella (MMR) vaccine.** / You need at least 1 dose of MMR if you were born in 1957 or later. You may also need a second dose. For females of childbearing age, rubella immunity should be determined. If there is no evidence of immunity, females who are not pregnant should be vaccinated. If there is no evidence of immunity, females who are pregnant should delay immunization until after pregnancy.  Pneumococcal 13-valent conjugate (PCV13) vaccine.** / Consult your health care provider.  Pneumococcal polysaccharide (PPSV23) vaccine.** / 1 to 2 doses if you smoke cigarettes or if you have certain conditions.  Meningococcal vaccine.** /  Consult your health care provider.  Hepatitis A vaccine.** / Consult your health care provider.  Hepatitis B vaccine.** / Consult your health care provider.  Haemophilus influenzae type b (Hib) vaccine.** / Consult your health care provider. Ages 64 years and over  Blood pressure check.** / Every year.  Lipid and cholesterol check.** / Every 5 years beginning at age 23 years.  Lung cancer screening. / Every year if you  are aged 16-80 years and have a 30-pack-year history of smoking and currently smoke or have quit within the past 15 years. Yearly screening is stopped once you have quit smoking for at least 15 years or develop a health problem that would prevent you from having lung cancer treatment.  Clinical breast exam.** / Every year after age 74 years.  BRCA-related cancer risk assessment.** / For women who have family members with a BRCA-related cancer (breast, ovarian, tubal, or peritoneal cancers).  Mammogram.** / Every year beginning at age 44 years and continuing for as long as you are in good health. Consult with your health care provider.  Pap test.** / Every 3 years starting at age 58 years through age 22 or 39 years with 3 consecutive normal Pap tests. Testing can be stopped between 65 and 70 years with 3 consecutive normal Pap tests and no abnormal Pap or HPV tests in the past 10 years.  HPV screening.** / Every 3 years from ages 64 years through ages 70 or 61 years with a history of 3 consecutive normal Pap tests. Testing can be stopped between 65 and 70 years with 3 consecutive normal Pap tests and no abnormal Pap or HPV tests in the past 10 years.  Fecal occult blood test (FOBT) of stool. / Every year beginning at age 40 years and continuing until age 27 years. You may not need to do this test if you get a colonoscopy every 10 years.  Flexible sigmoidoscopy or colonoscopy.** / Every 5 years for a flexible sigmoidoscopy or every 10 years for a colonoscopy beginning at age 7 years and continuing until age 32 years.  Hepatitis C blood test.** / For all people born from [birth year] through 1963 and any individual with known risks for hepatitis C.  Osteoporosis screening.** / A one-time screening for women ages 30 years and over and women at risk for fractures or osteoporosis.  Skin self-exam. / Monthly.  Influenza vaccine. / Every year.  Tetanus, diphtheria, and acellular pertussis (Tdap/Td)  vaccine.** / 1 dose of Td every 10 years.  Varicella vaccine.** / Consult your health care provider.  Zoster vaccine.** / 1 dose for adults aged 35 years or older.  Pneumococcal 13-valent conjugate (PCV13) vaccine.** / Consult your health care provider.  Pneumococcal polysaccharide (PPSV23) vaccine.** / 1 dose for all adults aged 46 years and older.  Meningococcal vaccine.** / Consult your health care provider.  Hepatitis A vaccine.** / Consult your health care provider.  Hepatitis B vaccine.** / Consult your health care provider.  Haemophilus influenzae type b (Hib) vaccine.** / Consult your health care provider. ** Family history and personal history of risk and conditions may change your health care provider's recommendations.   This information is not intended to replace advice given to you by your health care provider. Make sure you discuss any questions you have with your health care provider.   Document Released: 11/01/2001 Document Revised: 09/26/2014 Document Reviewed: 01/31/2011 Elsevier Interactive Patient Education [employer].

## 2015-11-12 ENCOUNTER — Other Ambulatory Visit: Payer: 59

## 2015-11-12 DIAGNOSIS — M79609 Pain in unspecified limb: Secondary | ICD-10-CM | POA: Diagnosis not present

## 2015-11-12 DIAGNOSIS — E119 Type 2 diabetes mellitus without complications: Secondary | ICD-10-CM | POA: Diagnosis not present

## 2015-11-12 DIAGNOSIS — E782 Mixed hyperlipidemia: Secondary | ICD-10-CM | POA: Diagnosis not present

## 2015-11-12 DIAGNOSIS — M25562 Pain in left knee: Secondary | ICD-10-CM | POA: Diagnosis not present

## 2015-11-12 DIAGNOSIS — M25561 Pain in right knee: Secondary | ICD-10-CM | POA: Diagnosis not present

## 2015-11-12 DIAGNOSIS — M25569 Pain in unspecified knee: Secondary | ICD-10-CM | POA: Diagnosis not present

## 2015-11-12 DIAGNOSIS — E669 Obesity, unspecified: Secondary | ICD-10-CM | POA: Diagnosis not present

## 2015-11-12 DIAGNOSIS — K219 Gastro-esophageal reflux disease without esophagitis: Secondary | ICD-10-CM | POA: Diagnosis not present

## 2015-11-12 DIAGNOSIS — B351 Tinea unguium: Secondary | ICD-10-CM | POA: Diagnosis not present

## 2015-11-12 LAB — HIV ANTIBODY (ROUTINE TESTING W REFLEX): HIV 1&2 Ab, 4th Generation: NONREACTIVE

## 2015-11-13 ENCOUNTER — Telehealth: Payer: Self-pay | Admitting: *Deleted

## 2015-11-13 LAB — LIPID PANEL
Cholesterol: 158 mg/dL (ref 0–200)
HDL: 40.4 mg/dL (ref >39.00)
LDL Cholesterol: 97 mg/dL (ref 0–99)
NonHDL: 117.85
Total CHOL/HDL Ratio: 4
Triglycerides: 103 mg/dL (ref 0.0–149.0)
VLDL: 20.6 mg/dL (ref 0.0–40.0)

## 2015-11-13 LAB — CBC WITH DIFFERENTIAL/PLATELET
Basophils Absolute: 0.1 10*3/uL (ref 0.0–0.1)
Basophils Relative: 0.6 % (ref 0.0–3.0)
Eosinophils Absolute: 0.2 10*3/uL (ref 0.0–0.7)
Eosinophils Relative: 1.9 % (ref 0.0–5.0)
HCT: 45.6 % (ref 36.0–46.0)
Hemoglobin: 14.8 g/dL (ref 12.0–15.0)
Lymphocytes Relative: 20.7 % (ref 12.0–46.0)
Lymphs Abs: 2.5 10*3/uL (ref 0.7–4.0)
MCHC: 32.4 g/dL (ref 30.0–36.0)
MCV: 91 fl (ref 78.0–100.0)
Monocytes Absolute: 0.3 10*3/uL (ref 0.1–1.0)
Monocytes Relative: 2.9 % — ABNORMAL LOW (ref 3.0–12.0)
Neutro Abs: 8.8 10*3/uL — ABNORMAL HIGH (ref 1.4–7.7)
Neutrophils Relative %: 73.9 % (ref 43.0–77.0)
Platelets: 401 10*3/uL — ABNORMAL HIGH (ref 150.0–400.0)
RBC: 5.01 Mil/uL (ref 3.87–5.11)
RDW: 13.7 % (ref 11.5–15.5)
WBC: 11.9 10*3/uL — ABNORMAL HIGH (ref 4.0–10.5)

## 2015-11-13 LAB — MICROALBUMIN / CREATININE URINE RATIO
Creatinine,U: 95.5 mg/dL
Microalb Creat Ratio: 0.9 mg/g (ref 0.0–30.0)
Microalb, Ur: 0.9 mg/dL (ref 0.0–1.9)

## 2015-11-13 LAB — COMPREHENSIVE METABOLIC PANEL
ALT: 16 U/L (ref 0–35)
AST: 14 U/L (ref 0–37)
Albumin: 4.3 g/dL (ref 3.5–5.2)
Alkaline Phosphatase: 85 U/L (ref 39–117)
BUN: 13 mg/dL (ref 6–23)
CO2: 31 mEq/L (ref 19–32)
Calcium: 9.7 mg/dL (ref 8.4–10.5)
Chloride: 102 mEq/L (ref 96–112)
Creatinine, Ser: 0.74 mg/dL (ref 0.40–1.20)
GFR: 105.57 mL/min (ref >60.00)
Glucose, Bld: 107 mg/dL — ABNORMAL HIGH (ref 70–99)
Potassium: 3.9 mEq/L (ref 3.5–5.1)
Sodium: 140 mEq/L (ref 135–145)
Total Bilirubin: 0.6 mg/dL (ref 0.2–1.2)
Total Protein: 7.9 g/dL (ref 6.0–8.3)

## 2015-11-13 LAB — RHEUMATOID FACTOR: Rhuematoid fact SerPl-aCnc: <10 IU/mL (ref <=14)

## 2015-11-13 LAB — HEMOGLOBIN A1C: Hgb A1c MFr Bld: 8 % — ABNORMAL HIGH (ref 4.6–6.5)

## 2015-11-13 LAB — TSH: TSH: 1.88 u[IU]/mL (ref 0.35–4.50)

## 2015-11-13 LAB — SEDIMENTATION RATE: Sed Rate: 24 mm/hr — ABNORMAL HIGH (ref 0–22)

## 2015-11-13 LAB — HEPATITIS C ANTIBODY: HCV Ab: NEGATIVE

## 2015-11-13 NOTE — Telephone Encounter (Signed)
Received fax from [employer] for FMLA Re-certification, completed as much as possible; forwarded to provider/SLS 02/24

## 2015-11-22 ENCOUNTER — Encounter: Payer: Self-pay | Admitting: Family Medicine

## 2015-11-22 DIAGNOSIS — Z0000 Encounter for general adult medical examination without abnormal findings: Secondary | ICD-10-CM

## 2015-11-22 HISTORY — DX: Encounter for general adult medical examination without abnormal findings: Z00.00

## 2015-11-22 NOTE — Assessment & Plan Note (Signed)
Hgba1c up to 8 was off of Januvia will restart and she will minimize simple carbs

## 2015-11-22 NOTE — Assessment & Plan Note (Signed)
Likely over use tendonopathy, encouraged ice, rest, topical treatments and given a wrist splint to use as tolerated, report if no improvement

## 2015-11-22 NOTE — Assessment & Plan Note (Signed)
Encouraged DASH diet, decrease po intake and increase exercise as tolerated. Needs 7-8 hours of sleep nightly. Avoid trans fats, eat small, frequent meals every 4-5 hours with lean proteins, complex carbs and healthy fats. Minimize simple carbs 

## 2015-11-22 NOTE — Progress Notes (Signed)
Patient ID: Eileen Richards, female   DOB: [birth year], 55 y.o.   MRN: [id number]   Subjective:    Patient ID: Eileen Richards, female    DOB: 04/07/1961, 56 y.o.   MRN: [id number]  Chief Complaint  Patient presents with  . Annual Exam    HPI Patient is in today for Annual exam and follow-up on numerous concerns. She is struggling with pain in her left hand for roughly 3 months. It is worse with use but occurs daily. No trauma or falls. No redness, warmth, swelling. She does also some neck stiffness as well occasional shoulder pain is noted. No recent illness. She denies any hospitalizations. Denies CP/palp/SOB/HA/congestion/fevers/GI or GU c/o. Taking meds as prescribed  Past Medical History  Diagnosis Date  . Diabetes mellitus   . Hypertension   . Hyperlipemia   . Atypical chest pain     normal stress echo 02/2009 -LV size was normal LV global systolic function was normal- Normal wall motion; no LV regional wall motion abnormalities  . Asthma     childhood  . GERD (gastroesophageal reflux disease)   . Pain of right heel 10/14/2012  . Achilles tendonitis 10/14/2012  . Chest pain, atypical 08/24/2013  . Onychomycosis 04/17/2014  . Thoracic outlet syndrome 11/03/2014  . Benign paroxysmal positional vertigo 04/26/2015  . Hand pain, left 11/10/2015  . Preventative health care 11/22/2015    Past Surgical History  Procedure Laterality Date  . Abdominal hysterectomy    . Dilation and curettage of uterus      Lysis of Adhesions  . Robotic assisted salpingo oopherectomy  07/23/2012    Procedure: ROBOTIC ASSISTED SALPINGO OOPHERECTOMY;  Surgeon: Serita Kyle, MD;  Location: [facility];  Service: Gynecology;  Laterality: Bilateral;  . Robotic assisted laparoscopic lysis of adhesion  07/23/2012    Procedure: ROBOTIC ASSISTED LAPAROSCOPIC LYSIS OF ADHESION;  Surgeon: Serita Kyle, MD;  Location: [facility];  Service: Gynecology;  Laterality: N/A;  . Cystoscopy  07/23/2012    Procedure:  CYSTOSCOPY;  Surgeon: Serita Kyle, MD;  Location: [facility];  Service: Gynecology;  Laterality: N/A;    Family History  Problem Relation Age of Onset  . GER disease Mother   . GER disease Father   . Heart disease Father     father has "heart problems"    Social History   Social History  . Marital Status: Single    Spouse Name: N/A  . Number of Children: N/A  . Years of Education: N/A   Occupational History  . Not on file.   Social History Main Topics  . Smoking status: Former Games developer  . Smokeless tobacco: Never Used  . Alcohol Use: No  . Drug Use: No  . Sexual Activity: Not on file   Other Topics Concern  . Not on file   Social History Narrative    Outpatient Prescriptions Prior to Visit  Medication Sig Dispense Refill  . albuterol (PROVENTIL HFA;VENTOLIN HFA) 108 (90 BASE) MCG/ACT inhaler Inhale 2 puffs into the lungs every 6 (six) hours as needed for wheezing or shortness of breath. 1 Inhaler 3  . aspirin 81 MG tablet Take 81 mg by mouth daily.    Marland Kitchen atorvastatin (LIPITOR) 20 MG tablet Take 1 tablet (20 mg total) by mouth daily. 90 tablet 3  . Blood Glucose Monitoring Suppl (TRUETRACK BLOOD GLUCOSE) W/DEVICE KIT by Does not apply route.      . carvedilol (COREG) 3.125 MG tablet TAKE 1 TABLET BY MOUTH 2  TIMES DAILY WITH A MEAL 60 tablet 11  . cyclobenzaprine (FLEXERIL) 10 MG tablet Take 1 tablet (10 mg total) by mouth at bedtime as needed for muscle spasms. 30 tablet 1  . Efinaconazole 10 % SOLN Apply 1 drop topically daily. 4 mL 11  . glimepiride (AMARYL) 4 MG tablet TAKE 1 TABLET BY MOUTH TWICE DAILY 60 tablet 2  . hydrochlorothiazide (HYDRODIURIL) 25 MG tablet TAKE 1 TABLET BY MOUTH DAILY. 90 tablet 5  . INVOKANA 100 MG TABS tablet TAKE 1 TABLET BY MOUTH DAILY. 30 tablet 5  . JANUVIA 100 MG tablet TAKE 1 TABLET BY MOUTH DAILY. 30 tablet 11  . Lancet Device MISC by Does not apply route. Use to test blood sugar three times daily     . meclizine (ANTIVERT) 25 MG  tablet Take 1 tablet (25 mg total) by mouth 3 (three) times daily as needed for dizziness. 30 tablet 0  . metFORMIN (GLUCOPHAGE-XR) 500 MG 24 hr tablet TAKE 2 TABLETS BY MOUTH TWICE DAILY AND 1 TABLET AT NOON 150 tablet 2  . Multiple Vitamin (MULTIVITAMIN) tablet Take 1 tablet by mouth daily.      . pantoprazole (PROTONIX) 40 MG tablet TAKE 1 TABLET BY MOUTH DAILY 30 tablet 5  . traMADol (ULTRAM) 50 MG tablet TAKE 2 TABLETS BY MOUTH TWICE DAILY AS NEEDED 60 tablet PRN  . TRUETEST TEST test strip USE TO TEST BLOOD SUGAR THREE TIMES A DAY 100 each 11  . meloxicam (MOBIC) 15 MG tablet TAKE 1 TABLET BY MOUTH DAILY AS NEEDED FOR PAIN. 30 tablet 0  . nitroGLYCERIN (NITROSTAT) 0.4 MG SL tablet Place 1 tablet (0.4 mg total) under the tongue every 5 (five) minutes as needed for chest pain. (Patient not taking: Reported on 04/30/2015) 25 tablet 3  . WELCHOL 3.75 G PACK TAKE 1 PACKET BY MOUTH DAILY. 30 each 11   No facility-administered medications prior to visit.    No Known Allergies  Review of Systems  Constitutional: Positive for malaise/fatigue. Negative for fever and chills.  HENT: Negative for congestion and hearing loss.   Eyes: Negative for discharge.  Respiratory: Negative for cough, sputum production and shortness of breath.   Cardiovascular: Negative for chest pain, palpitations and leg swelling.  Gastrointestinal: Negative for heartburn, nausea, vomiting, abdominal pain, diarrhea, constipation and blood in stool.  Genitourinary: Negative for dysuria, urgency, frequency and hematuria.  Musculoskeletal: Positive for myalgias, joint pain and neck pain. Negative for back pain and falls.  Skin: Negative for rash.  Neurological: Negative for dizziness, sensory change, loss of consciousness, weakness and headaches.  Endo/Heme/Allergies: Negative for environmental allergies. Does not bruise/bleed easily.  Psychiatric/Behavioral: Negative for depression and suicidal ideas. The patient is not  nervous/anxious and does not have insomnia.        Objective:    Physical Exam  Constitutional: She is oriented to person, place, and time. She appears well-developed and well-nourished. No distress.  HENT:  Head: Normocephalic and atraumatic.  Eyes: Conjunctivae are normal.  Neck: Neck supple. No thyromegaly present.  Cardiovascular: Normal rate, regular rhythm and normal heart sounds.   No murmur heard. Pulmonary/Chest: Effort normal and breath sounds normal. No respiratory distress.  Abdominal: Soft. Bowel sounds are normal. She exhibits no distension and no mass. There is no tenderness.  Musculoskeletal: She exhibits no edema.  Lymphadenopathy:    She has no cervical adenopathy.  Neurological: She is alert and oriented to person, place, and time.  Skin: Skin is warm and dry.  Psychiatric: She has a normal mood and affect. Her behavior is normal.    BP 123/67 mmHg  Pulse 82  Temp(Src) 98 F (36.7 C) (Oral)  Ht '5\' 4"'$  (1.626 m)  Wt 239 lb 4 oz (108.523 kg)  BMI 41.05 kg/m2  SpO2 98% Wt Readings from Last 3 Encounters:  11/10/15 239 lb 4 oz (108.523 kg)  08/06/15 237 lb 12.8 oz (107.865 kg)  04/30/15 244 lb (110.678 kg)     Lab Results  Component Value Date   WBC 11.9* 11/12/2015   HGB 14.8 11/12/2015   HCT 45.6 11/12/2015   PLT 401.0* 11/12/2015   GLUCOSE 107* 11/12/2015   CHOL 158 11/12/2015   TRIG 103.0 11/12/2015   HDL 40.40 11/12/2015   LDLCALC 97 11/12/2015   ALT 16 11/12/2015   AST 14 11/12/2015   NA 140 11/12/2015   K 3.9 11/12/2015   CL 102 11/12/2015   CREATININE 0.74 11/12/2015   BUN 13 11/12/2015   CO2 31 11/12/2015   TSH 1.88 11/12/2015   HGBA1C 8.0* 11/12/2015   MICROALBUR 0.9 11/12/2015    Lab Results  Component Value Date   TSH 1.88 11/12/2015   Lab Results  Component Value Date   WBC 11.9* 11/12/2015   HGB 14.8 11/12/2015   HCT 45.6 11/12/2015   MCV 91.0 11/12/2015   PLT 401.0* 11/12/2015   Lab Results  Component Value Date     NA 140 11/12/2015   K 3.9 11/12/2015   CO2 31 11/12/2015   GLUCOSE 107* 11/12/2015   BUN 13 11/12/2015   CREATININE 0.74 11/12/2015   BILITOT 0.6 11/12/2015   ALKPHOS 85 11/12/2015   AST 14 11/12/2015   ALT 16 11/12/2015   PROT 7.9 11/12/2015   ALBUMIN 4.3 11/12/2015   CALCIUM 9.7 11/12/2015   GFR 105.57 11/12/2015   Lab Results  Component Value Date   CHOL 158 11/12/2015   Lab Results  Component Value Date   HDL 40.40 11/12/2015   Lab Results  Component Value Date   LDLCALC 97 11/12/2015   Lab Results  Component Value Date   TRIG 103.0 11/12/2015   Lab Results  Component Value Date   CHOLHDL 4 11/12/2015   Lab Results  Component Value Date   HGBA1C 8.0* 11/12/2015       Assessment & Plan:   Problem List Items Addressed This Visit    Diabetes mellitus type 2 in obese ([place])    Hgba1c up to 8 was off of Januvia will restart and she will minimize simple carbs      Relevant Orders   Hemoglobin A1c (Completed)   Lipid panel (Completed)   Comprehensive metabolic panel (Completed)   Microalbumin / creatinine urine ratio (Completed)   Hepatitis C antibody (Completed)   HIV antibody (with reflex) (Completed)   CBC with Differential (Completed)   TSH (Completed)   Rheumatoid Factor (Completed)   Sed Rate (ESR) (Completed)   Essential hypertension    Well controlled, no changes to meds. Encouraged heart healthy diet such as the DASH diet and exercise as tolerated.       GERD (gastroesophageal reflux disease)   Relevant Orders   Hemoglobin A1c (Completed)   Lipid panel (Completed)   Comprehensive metabolic panel (Completed)   Microalbumin / creatinine urine ratio (Completed)   Hepatitis C antibody (Completed)   HIV antibody (with reflex) (Completed)   CBC with Differential (Completed)   TSH (Completed)   Rheumatoid Factor (Completed)   Sed Rate (ESR) (  Completed)   Hand pain, left    Likely over use tendonopathy, encouraged ice, rest, topical  treatments and given a wrist splint to use as tolerated, report if no improvement      Relevant Orders   Hemoglobin A1c (Completed)   Lipid panel (Completed)   Comprehensive metabolic panel (Completed)   Microalbumin / creatinine urine ratio (Completed)   Hepatitis C antibody (Completed)   HIV antibody (with reflex) (Completed)   CBC with Differential (Completed)   TSH (Completed)   Rheumatoid Factor (Completed)   Sed Rate (ESR) (Completed)   Wrist splint   Hyperlipidemia, mixed    Tolerating statin, encouraged heart healthy diet, avoid trans fats, minimize simple carbs and saturated fats. Increase exercise as tolerated      Relevant Orders   Hemoglobin A1c (Completed)   Lipid panel (Completed)   Comprehensive metabolic panel (Completed)   Microalbumin / creatinine urine ratio (Completed)   Hepatitis C antibody (Completed)   HIV antibody (with reflex) (Completed)   CBC with Differential (Completed)   TSH (Completed)   Rheumatoid Factor (Completed)   Sed Rate (ESR) (Completed)   Knee pain, bilateral   Relevant Orders   Hemoglobin A1c (Completed)   Lipid panel (Completed)   Comprehensive metabolic panel (Completed)   Microalbumin / creatinine urine ratio (Completed)   Hepatitis C antibody (Completed)   HIV antibody (with reflex) (Completed)   CBC with Differential (Completed)   TSH (Completed)   Rheumatoid Factor (Completed)   Sed Rate (ESR) (Completed)   Obesity    Encouraged DASH diet, decrease po intake and increase exercise as tolerated. Needs 7-8 hours of sleep nightly. Avoid trans fats, eat small, frequent meals every 4-5 hours with lean proteins, complex carbs and healthy fats. Minimize simple carbs      Onychomycosis - Primary    Soak in distilled vinegar and hot water nightly, apply Lamisil cream and/or vick's vapor rub      Relevant Orders   Hemoglobin A1c (Completed)   Lipid panel (Completed)   Comprehensive metabolic panel (Completed)   Microalbumin /  creatinine urine ratio (Completed)   Hepatitis C antibody (Completed)   HIV antibody (with reflex) (Completed)   CBC with Differential (Completed)   TSH (Completed)   Rheumatoid Factor (Completed)   Sed Rate (ESR) (Completed)   Preventative health care    Patient encouraged to maintain heart healthy diet, regular exercise, adequate sleep. Consider daily probiotics. Take medications as prescribed. Labs reviewed. Given and reviewed copy of ACP documents from [place] and encouraged to complete and return         I have discontinued Ms. Betzer's WELCHOL and meloxicam. I am also having her start on naproxen. Additionally, I am having her maintain her Lancet Device, TRUETRACK BLOOD GLUCOSE, multivitamin, nitroGLYCERIN, aspirin, Efinaconazole, albuterol, cyclobenzaprine, INVOKANA, pantoprazole, hydrochlorothiazide, meclizine, TRUETEST TEST, metFORMIN, glimepiride, traMADol, atorvastatin, JANUVIA, and carvedilol.  Meds ordered this encounter  Medications  . naproxen (NAPROSYN) 500 MG tablet    Sig: Take 1 tablet (500 mg total) by mouth 2 (two) times daily as needed for mild pain or moderate pain (with food).    Dispense:  60 tablet    Refill:  1     Penni Homans, MD

## 2015-11-22 NOTE — Assessment & Plan Note (Signed)
Well controlled, no changes to meds. Encouraged heart healthy diet such as the DASH diet and exercise as tolerated.  °

## 2015-11-22 NOTE — Assessment & Plan Note (Signed)
Tolerating statin, encouraged heart healthy diet, avoid trans fats, minimize simple carbs and saturated fats. Increase exercise as tolerated 

## 2015-11-22 NOTE — Assessment & Plan Note (Signed)
Patient encouraged to maintain heart healthy diet, regular exercise, adequate sleep. Consider daily probiotics. Take medications as prescribed. Labs reviewed. Given and reviewed copy of ACP documents from [place] Secretary of State and encouraged to complete and return 

## 2015-11-22 NOTE — Assessment & Plan Note (Signed)
Soak in distilled vinegar and hot water nightly, apply Lamisil cream and/or vick's vapor rub

## 2015-11-30 ENCOUNTER — Other Ambulatory Visit: Payer: Self-pay | Admitting: Family Medicine

## 2015-11-30 MED FILL — INVOKANA 100 MG TABLET: 100 | 30 days supply | Qty: 30 | Fill #0

## 2015-11-30 MED FILL — METFORMIN HCL ER 500 MG TAB: 500 | 30 days supply | Qty: 150 | Fill #0

## 2015-12-15 ENCOUNTER — Encounter: Payer: Self-pay | Admitting: Family Medicine

## 2015-12-15 ENCOUNTER — Ambulatory Visit (INDEPENDENT_AMBULATORY_CARE_PROVIDER_SITE_OTHER): Payer: 59 | Admitting: Family Medicine

## 2015-12-15 VITALS — BP 112/72 | HR 79 | Temp 98.4°F | Ht 64.0 in | Wt 238.5 lb

## 2015-12-15 DIAGNOSIS — M79642 Pain in left hand: Secondary | ICD-10-CM

## 2015-12-15 DIAGNOSIS — R3915 Urgency of urination: Secondary | ICD-10-CM | POA: Diagnosis not present

## 2015-12-15 DIAGNOSIS — Z0000 Encounter for general adult medical examination without abnormal findings: Secondary | ICD-10-CM

## 2015-12-15 DIAGNOSIS — E782 Mixed hyperlipidemia: Secondary | ICD-10-CM

## 2015-12-15 DIAGNOSIS — E669 Obesity, unspecified: Secondary | ICD-10-CM

## 2015-12-15 DIAGNOSIS — M79641 Pain in right hand: Secondary | ICD-10-CM | POA: Diagnosis not present

## 2015-12-15 DIAGNOSIS — E1169 Type 2 diabetes mellitus with other specified complication: Secondary | ICD-10-CM

## 2015-12-15 DIAGNOSIS — K219 Gastro-esophageal reflux disease without esophagitis: Secondary | ICD-10-CM

## 2015-12-15 DIAGNOSIS — E119 Type 2 diabetes mellitus without complications: Secondary | ICD-10-CM

## 2015-12-15 DIAGNOSIS — I10 Essential (primary) hypertension: Secondary | ICD-10-CM | POA: Diagnosis not present

## 2015-12-15 HISTORY — DX: Urgency of urination: R39.15

## 2015-12-15 MED ORDER — ALBUTEROL SULFATE HFA 108 (90 BASE) MCG/ACT IN AERS: 2.0000 | INHALATION_SPRAY | Freq: Four times a day (QID) | RESPIRATORY_TRACT | Status: DC | PRN

## 2015-12-15 NOTE — Patient Instructions (Signed)

## 2015-12-15 NOTE — Progress Notes (Signed)
Subjective:    Patient ID: Eileen Richards, female    DOB: [birth year], 54 y.o.   MRN: [id number]  Chief Complaint  Patient presents with  . Follow-up    HPI Patient is in today for follow up. She continues to struggle with left hand pain and is now struggling with some numbness in middle finger. No recent fall or new injury. She has tried splinting and topical treatments without much improvement. Her blood sugars have been ranging from the 130s to 200s. No polyuria or polydipsia. Denies CP/palp/SOB/HA/congestion/fevers/GI or GU c/o. Taking meds as prescribed  Past Medical History  Diagnosis Date  . Diabetes mellitus   . Hypertension   . Hyperlipemia   . Atypical chest pain     normal stress echo 02/2009 -LV size was normal LV global systolic function was normal- Normal wall motion; no LV regional wall motion abnormalities  . Asthma     childhood  . GERD (gastroesophageal reflux disease)   . Pain of right heel 10/14/2012  . Achilles tendonitis 10/14/2012  . Chest pain, atypical 08/24/2013  . Onychomycosis 04/17/2014  . Thoracic outlet syndrome 11/03/2014  . Benign paroxysmal positional vertigo 04/26/2015  . Hand pain, left 11/10/2015  . Preventative health care 11/22/2015  . Urinary urgency 12/15/2015    Past Surgical History  Procedure Laterality Date  . Abdominal hysterectomy    . Dilation and curettage of uterus      Lysis of Adhesions  . Robotic assisted salpingo oopherectomy  07/23/2012    Procedure: ROBOTIC ASSISTED SALPINGO OOPHERECTOMY;  Surgeon: Marvene Staff, MD;  Location: [facility];  Service: Gynecology;  Laterality: Bilateral;  . Robotic assisted laparoscopic lysis of adhesion  07/23/2012    Procedure: ROBOTIC ASSISTED LAPAROSCOPIC LYSIS OF ADHESION;  Surgeon: Marvene Staff, MD;  Location: [facility];  Service: Gynecology;  Laterality: N/A;  . Cystoscopy  07/23/2012    Procedure: CYSTOSCOPY;  Surgeon: Marvene Staff, MD;  Location: [facility];  Service: Gynecology;   Laterality: N/A;    Family History  Problem Relation Age of Onset  . GER disease Mother   . GER disease Father   . Heart disease Father     father has "heart problems"    Social History   Social History  . Marital Status: Single    Spouse Name: N/A  . Number of Children: N/A  . Years of Education: N/A   Occupational History  . Not on file.   Social History Main Topics  . Smoking status: Former Research scientist (life sciences)  . Smokeless tobacco: Never Used  . Alcohol Use: No  . Drug Use: No  . Sexual Activity: Not on file   Other Topics Concern  . Not on file   Social History Narrative    Outpatient Prescriptions Prior to Visit  Medication Sig Dispense Refill  . aspirin 81 MG tablet Take 81 mg by mouth daily.    Marland Kitchen atorvastatin (LIPITOR) 20 MG tablet Take 1 tablet (20 mg total) by mouth daily. 90 tablet 3  . Blood Glucose Monitoring Suppl (TRUETRACK BLOOD GLUCOSE) W/DEVICE KIT by Does not apply route.      . carvedilol (COREG) 3.125 MG tablet TAKE 1 TABLET BY MOUTH 2 TIMES DAILY WITH A MEAL 60 tablet 11  . cyclobenzaprine (FLEXERIL) 10 MG tablet Take 1 tablet (10 mg total) by mouth at bedtime as needed for muscle spasms. 30 tablet 1  . Efinaconazole 10 % SOLN Apply 1 drop topically daily. 4 mL 11  .  glimepiride (AMARYL) 4 MG tablet TAKE 1 TABLET BY MOUTH TWICE DAILY 60 tablet 2  . hydrochlorothiazide (HYDRODIURIL) 25 MG tablet TAKE 1 TABLET BY MOUTH DAILY. 90 tablet 5  . INVOKANA 100 MG TABS tablet TAKE 1 TABLET BY MOUTH DAILY. 30 tablet 5  . INVOKANA 100 MG TABS tablet TAKE 1 TABLET BY MOUTH DAILY. 30 tablet 2  . JANUVIA 100 MG tablet TAKE 1 TABLET BY MOUTH DAILY. 30 tablet 11  . Lancet Device MISC by Does not apply route. Use to test blood sugar three times daily     . meclizine (ANTIVERT) 25 MG tablet Take 1 tablet (25 mg total) by mouth 3 (three) times daily as needed for dizziness. 30 tablet 0  . metFORMIN (GLUCOPHAGE-XR) 500 MG 24 hr tablet TAKE 2 TABLETS BY MOUTH TWICE DAILY AND 1  TABLET AT NOON 150 tablet 5  . Multiple Vitamin (MULTIVITAMIN) tablet Take 1 tablet by mouth daily.      . naproxen (NAPROSYN) 500 MG tablet Take 1 tablet (500 mg total) by mouth 2 (two) times daily as needed for mild pain or moderate pain (with food). 60 tablet 1  . nitroGLYCERIN (NITROSTAT) 0.4 MG SL tablet Place 1 tablet (0.4 mg total) under the tongue every 5 (five) minutes as needed for chest pain. (Patient not taking: Reported on 04/30/2015) 25 tablet 3  . pantoprazole (PROTONIX) 40 MG tablet TAKE 1 TABLET BY MOUTH DAILY 30 tablet 5  . traMADol (ULTRAM) 50 MG tablet TAKE 2 TABLETS BY MOUTH TWICE DAILY AS NEEDED 60 tablet PRN  . TRUETEST TEST test strip USE TO TEST BLOOD SUGAR THREE TIMES A DAY 100 each 11  . albuterol (PROVENTIL HFA;VENTOLIN HFA) 108 (90 BASE) MCG/ACT inhaler Inhale 2 puffs into the lungs every 6 (six) hours as needed for wheezing or shortness of breath. 1 Inhaler 3   No facility-administered medications prior to visit.    No Known Allergies  Review of Systems  Constitutional: Positive for malaise/fatigue. Negative for fever.  HENT: Negative for congestion.   Eyes: Negative for blurred vision.  Respiratory: Negative for shortness of breath.   Cardiovascular: Negative for chest pain, palpitations and leg swelling.  Gastrointestinal: Negative for nausea, abdominal pain and blood in stool.  Genitourinary: Negative for dysuria and frequency.  Musculoskeletal: Positive for joint pain. Negative for falls.  Skin: Negative for rash.  Neurological: Negative for dizziness, loss of consciousness and headaches.  Endo/Heme/Allergies: Negative for environmental allergies.  Psychiatric/Behavioral: Negative for depression. The patient is not nervous/anxious.        Objective:    Physical Exam  Constitutional: She is oriented to person, place, and time. She appears well-developed and well-nourished. No distress.  HENT:  Head: Normocephalic and atraumatic.  Eyes: Conjunctivae  are normal.  Neck: Neck supple. No thyromegaly present.  Cardiovascular: Normal rate, regular rhythm and normal heart sounds.   No murmur heard. Pulmonary/Chest: Effort normal and breath sounds normal. No respiratory distress.  Abdominal: Soft. Bowel sounds are normal. She exhibits no distension and no mass. There is no tenderness.  Musculoskeletal: She exhibits no edema.  Lymphadenopathy:    She has no cervical adenopathy.  Neurological: She is alert and oriented to person, place, and time.  Skin: Skin is warm and dry.  Psychiatric: She has a normal mood and affect. Her behavior is normal.    BP 112/72 mmHg  Pulse 79  Temp(Src) 98.4 F (36.9 C) (Oral)  Ht '5\' 4"'$  (1.626 m)  Wt 238 lb 8  oz (108.183 kg)  BMI 40.92 kg/m2  SpO2 97% Wt Readings from Last 3 Encounters:  12/15/15 238 lb 8 oz (108.183 kg)  11/10/15 239 lb 4 oz (108.523 kg)  08/06/15 237 lb 12.8 oz (107.865 kg)     Lab Results  Component Value Date   WBC 11.9* 11/12/2015   HGB 14.8 11/12/2015   HCT 45.6 11/12/2015   PLT 401.0* 11/12/2015   GLUCOSE 107* 11/12/2015   CHOL 158 11/12/2015   TRIG 103.0 11/12/2015   HDL 40.40 11/12/2015   LDLCALC 97 11/12/2015   ALT 16 11/12/2015   AST 14 11/12/2015   NA 140 11/12/2015   K 3.9 11/12/2015   CL 102 11/12/2015   CREATININE 0.74 11/12/2015   BUN 13 11/12/2015   CO2 31 11/12/2015   TSH 1.88 11/12/2015   HGBA1C 8.0* 11/12/2015   MICROALBUR 0.9 11/12/2015    Lab Results  Component Value Date   TSH 1.88 11/12/2015   Lab Results  Component Value Date   WBC 11.9* 11/12/2015   HGB 14.8 11/12/2015   HCT 45.6 11/12/2015   MCV 91.0 11/12/2015   PLT 401.0* 11/12/2015   Lab Results  Component Value Date   NA 140 11/12/2015   K 3.9 11/12/2015   CO2 31 11/12/2015   GLUCOSE 107* 11/12/2015   BUN 13 11/12/2015   CREATININE 0.74 11/12/2015   BILITOT 0.6 11/12/2015   ALKPHOS 85 11/12/2015   AST 14 11/12/2015   ALT 16 11/12/2015   PROT 7.9 11/12/2015   ALBUMIN  4.3 11/12/2015   CALCIUM 9.7 11/12/2015   GFR 105.57 11/12/2015   Lab Results  Component Value Date   CHOL 158 11/12/2015   Lab Results  Component Value Date   HDL 40.40 11/12/2015   Lab Results  Component Value Date   LDLCALC 97 11/12/2015   Lab Results  Component Value Date   TRIG 103.0 11/12/2015   Lab Results  Component Value Date   CHOLHDL 4 11/12/2015   Lab Results  Component Value Date   HGBA1C 8.0* 11/12/2015       Assessment & Plan:   Problem List Items Addressed This Visit      Other   Urinary urgency    Other Visit Diagnoses    Hand pain, right    -  Primary       I am having Ms. Sugarman maintain her Lancet Device, TRUETRACK BLOOD GLUCOSE, multivitamin, nitroGLYCERIN, aspirin, Efinaconazole, cyclobenzaprine, INVOKANA, pantoprazole, hydrochlorothiazide, meclizine, TRUETEST TEST, glimepiride, traMADol, atorvastatin, JANUVIA, carvedilol, naproxen, metFORMIN, INVOKANA, and albuterol.  Meds ordered this encounter  Medications  . albuterol (PROVENTIL HFA;VENTOLIN HFA) 108 (90 Base) MCG/ACT inhaler    Sig: Inhale 2 puffs into the lungs every 6 (six) hours as needed for wheezing or shortness of breath.    Dispense:  1 Inhaler    Refill:  Alexander, RMA

## 2015-12-15 NOTE — Progress Notes (Signed)
Pre visit review using our clinic review tool, if applicable. No additional management support is needed unless otherwise documented below in the visit note. 

## 2015-12-16 MED FILL — VENTOLIN HFA 90 MCG INHALER: 108 (90 BAS | 25 days supply | Qty: 18 | Fill #0

## 2015-12-16 NOTE — Assessment & Plan Note (Signed)
Persists despite splinting and topical treatments. Referred to ortho for further management at this time.

## 2015-12-16 NOTE — Assessment & Plan Note (Signed)
Tolerating statin, encouraged heart healthy diet, avoid trans fats, minimize simple carbs and saturated fats. Increase exercise as tolerated 

## 2015-12-16 NOTE — Assessment & Plan Note (Signed)
Avoid offending foods, start probiotics. Do not eat large meals in late evening and consider raising head of bed.  

## 2015-12-16 NOTE — Assessment & Plan Note (Signed)
hgba1c improved some, minimize simple carbs. Increase exercise as tolerated. Continue current meds

## 2015-12-16 NOTE — Assessment & Plan Note (Signed)
Well controlled, no changes to meds. Encouraged heart healthy diet such as the DASH diet and exercise as tolerated.  °

## 2015-12-21 MED FILL — traMADol HCL 50 MG TABS: 50 | 15 days supply | Qty: 60 | Fill #2

## 2015-12-28 ENCOUNTER — Other Ambulatory Visit: Payer: Self-pay | Admitting: Family Medicine

## 2015-12-28 MED FILL — INVOKANA 100 MG TABLET: 100 | 30 days supply | Qty: 30 | Fill #1

## 2015-12-28 MED FILL — PANTOPRAZOLE SOD DR 40 MG T: 40 | 30 days supply | Qty: 30 | Fill #0

## 2015-12-28 MED FILL — GLIMEPIRIDE 4 MG TABLET: 4 | 30 days supply | Qty: 60 | Fill #0

## 2015-12-28 MED FILL — METFORMIN HCL ER 500 MG TAB: 500 | 30 days supply | Qty: 150 | Fill #1

## 2015-12-28 MED FILL — HYDROCHLOROTHIAZIDE 25 MG T: 25 | 90 days supply | Qty: 90 | Fill #2

## 2015-12-28 MED FILL — ATORVASTATIN 20 MG TABLET: 20 | 90 days supply | Qty: 90 | Fill #1

## 2015-12-31 ENCOUNTER — Encounter (HOSPITAL_BASED_OUTPATIENT_CLINIC_OR_DEPARTMENT_OTHER): Payer: Self-pay

## 2015-12-31 ENCOUNTER — Emergency Department (HOSPITAL_BASED_OUTPATIENT_CLINIC_OR_DEPARTMENT_OTHER)
Admission: EM | Admit: 2015-12-31 | Discharge: 2015-12-31 | Disposition: A | Discharge status: Home / Self Care | Payer: 59 | Attending: Emergency Medicine | Admitting: Emergency Medicine

## 2015-12-31 ENCOUNTER — Encounter: Payer: Self-pay | Admitting: Medical

## 2015-12-31 ENCOUNTER — Emergency Department (HOSPITAL_BASED_OUTPATIENT_CLINIC_OR_DEPARTMENT_OTHER): Payer: 59

## 2015-12-31 ENCOUNTER — Ambulatory Visit (INDEPENDENT_AMBULATORY_CARE_PROVIDER_SITE_OTHER): Payer: 59 | Admitting: Medical

## 2015-12-31 VITALS — BP 124/86 | HR 91 | Temp 98.1°F | Ht 64.0 in | Wt 232.0 lb

## 2015-12-31 DIAGNOSIS — Z794 Long term (current) use of insulin: Secondary | ICD-10-CM | POA: Insufficient documentation

## 2015-12-31 DIAGNOSIS — Z79899 Other long term (current) drug therapy: Secondary | ICD-10-CM | POA: Insufficient documentation

## 2015-12-31 DIAGNOSIS — W228XXA Striking against or struck by other objects, initial encounter: Secondary | ICD-10-CM | POA: Diagnosis not present

## 2015-12-31 DIAGNOSIS — Z87891 Personal history of nicotine dependence: Secondary | ICD-10-CM | POA: Diagnosis not present

## 2015-12-31 DIAGNOSIS — Y939 Activity, unspecified: Secondary | ICD-10-CM | POA: Diagnosis not present

## 2015-12-31 DIAGNOSIS — R51 Headache: Secondary | ICD-10-CM | POA: Diagnosis not present

## 2015-12-31 DIAGNOSIS — I10 Essential (primary) hypertension: Secondary | ICD-10-CM | POA: Diagnosis not present

## 2015-12-31 DIAGNOSIS — Y929 Unspecified place or not applicable: Secondary | ICD-10-CM | POA: Diagnosis not present

## 2015-12-31 DIAGNOSIS — E785 Hyperlipidemia, unspecified: Secondary | ICD-10-CM | POA: Diagnosis not present

## 2015-12-31 DIAGNOSIS — S060X0A Concussion without loss of consciousness, initial encounter: Secondary | ICD-10-CM | POA: Insufficient documentation

## 2015-12-31 DIAGNOSIS — S0990XA Unspecified injury of head, initial encounter: Secondary | ICD-10-CM | POA: Diagnosis present

## 2015-12-31 DIAGNOSIS — Y999 Unspecified external cause status: Secondary | ICD-10-CM | POA: Insufficient documentation

## 2015-12-31 DIAGNOSIS — E119 Type 2 diabetes mellitus without complications: Secondary | ICD-10-CM | POA: Insufficient documentation

## 2015-12-31 DIAGNOSIS — R519 Headache, unspecified: Secondary | ICD-10-CM

## 2015-12-31 MED ORDER — PROCHLORPERAZINE EDISYLATE 5 MG/ML IJ SOLN
10.0000 mg | Freq: Once | INTRAMUSCULAR | Status: AC
  Administered 2015-12-31: 10 mg via INTRAVENOUS
  Filled 2015-12-31: qty 2

## 2015-12-31 MED ORDER — SODIUM CHLORIDE 0.9 % IV BOLUS (SEPSIS)
1000.0000 mL | Freq: Once | INTRAVENOUS | Status: AC
  Administered 2015-12-31: 1000 mL via INTRAVENOUS

## 2015-12-31 MED ORDER — DIPHENHYDRAMINE HCL 50 MG/ML IJ SOLN
25.0000 mg | Freq: Once | INTRAMUSCULAR | Status: AC
  Administered 2015-12-31: 25 mg via INTRAVENOUS
  Filled 2015-12-31: qty 1

## 2015-12-31 NOTE — Progress Notes (Signed)
Pre visit review using our clinic review tool, if applicable. No additional management support is needed unless otherwise documented below in the visit note. 

## 2015-12-31 NOTE — ED Provider Notes (Signed)
CSN: [id number]     Arrival date & time 12/31/15  1602 History   First MD Initiated Contact with Patient 12/31/15 1716     Chief Complaint  Patient presents with  . Headache     (Consider location/radiation/quality/duration/timing/severity/associated sxs/prior Treatment) Patient is a 55 y.o. female presenting with headaches. The history is provided by the patient.  Headache Pain location:  Generalized Quality:  Sharp Radiates to:  Does not radiate Severity currently:  10/10 Severity at highest:  10/10 Onset quality:  Gradual Duration:  2 days Timing:  Constant Progression:  Worsening Chronicity:  New Similar to prior headaches: no   Relieved by:  Nothing Worsened by:  Nothing Ineffective treatments:  None tried Associated symptoms: no congestion, no dizziness, no fever, no myalgias, no nausea and no vomiting     55 yo F With a chief complaint of a headache. Patient struck her head against a paper towel holder a couple days ago. She denies loss of consciousness. Since then has had a mild headache that is slowly worsening. Nauseated but no vomiting. No fevers or chills denies neck pain or stiffness. Denies sick contacts. She is unsure what makes the headache worse.  Past Medical History  Diagnosis Date  . Diabetes mellitus   . Hypertension   . Hyperlipemia   . Atypical chest pain     normal stress echo 02/2009 -LV size was normal LV global systolic function was normal- Normal wall motion; no LV regional wall motion abnormalities  . Asthma     childhood  . GERD (gastroesophageal reflux disease)   . Pain of right heel 10/14/2012  . Achilles tendonitis 10/14/2012  . Chest pain, atypical 08/24/2013  . Onychomycosis 04/17/2014  . Thoracic outlet syndrome 11/03/2014  . Benign paroxysmal positional vertigo 04/26/2015  . Hand pain, left 11/10/2015  . Preventative health care 11/22/2015  . Urinary urgency 12/15/2015   Past Surgical History  Procedure Laterality Date  . Abdominal  hysterectomy    . Dilation and curettage of uterus      Lysis of Adhesions  . Robotic assisted salpingo oopherectomy  07/23/2012    Procedure: ROBOTIC ASSISTED SALPINGO OOPHERECTOMY;  Surgeon: Marvene Staff, MD;  Location: [facility];  Service: Gynecology;  Laterality: Bilateral;  . Robotic assisted laparoscopic lysis of adhesion  07/23/2012    Procedure: ROBOTIC ASSISTED LAPAROSCOPIC LYSIS OF ADHESION;  Surgeon: Marvene Staff, MD;  Location: [facility];  Service: Gynecology;  Laterality: N/A;  . Cystoscopy  07/23/2012    Procedure: CYSTOSCOPY;  Surgeon: Marvene Staff, MD;  Location: [facility];  Service: Gynecology;  Laterality: N/A;   Family History  Problem Relation Age of Onset  . GER disease Mother   . GER disease Father   . Heart disease Father     father has "heart problems"   Social History  Substance Use Topics  . Smoking status: Former Research scientist (life sciences)  . Smokeless tobacco: Never Used  . Alcohol Use: No   OB History    No data available     Review of Systems  Constitutional: Negative for fever and chills.  HENT: Negative for congestion and rhinorrhea.   Eyes: Negative for redness and visual disturbance.  Respiratory: Negative for shortness of breath and wheezing.   Cardiovascular: Negative for chest pain and palpitations.  Gastrointestinal: Negative for nausea and vomiting.  Genitourinary: Negative for dysuria and urgency.  Musculoskeletal: Negative for myalgias and arthralgias.  Skin: Negative for pallor and wound.  Neurological: Positive for headaches. Negative  for dizziness.      Allergies  Review of patient's allergies indicates no known allergies.  Home Medications   Prior to Admission medications   Medication Sig Start Date End Date Taking? Authorizing Provider  albuterol (PROVENTIL HFA;VENTOLIN HFA) 108 (90 Base) MCG/ACT inhaler Inhale 2 puffs into the lungs every 6 (six) hours as needed for wheezing or shortness of breath. 12/15/15   Mosie Lukes, MD   aspirin 81 MG tablet Take 81 mg by mouth daily.    Historical Provider, MD  atorvastatin (LIPITOR) 20 MG tablet Take 1 tablet (20 mg total) by mouth daily. 08/25/15   Mosie Lukes, MD  Blood Glucose Monitoring Suppl ([place] BLOOD GLUCOSE) W/DEVICE KIT by Does not apply route.      Historical Provider, MD  carvedilol (COREG) 3.125 MG tablet TAKE 1 TABLET BY MOUTH 2 TIMES DAILY WITH A MEAL 11/09/15   Mosie Lukes, MD  cyclobenzaprine (FLEXERIL) 10 MG tablet Take 1 tablet (10 mg total) by mouth at bedtime as needed for muscle spasms. 10/27/14   Mosie Lukes, MD  Efinaconazole 10 % SOLN Apply 1 drop topically daily. 05/29/14   Trula Slade, DPM  glimepiride (AMARYL) 4 MG tablet TAKE 1 TABLET BY MOUTH TWICE DAILY 12/28/15   Mosie Lukes, MD  hydrochlorothiazide (HYDRODIURIL) 25 MG tablet TAKE 1 TABLET BY MOUTH DAILY. 04/01/15   Mosie Lukes, MD  INVOKANA 100 MG TABS tablet TAKE 1 TABLET BY MOUTH DAILY. 01/20/15   Mosie Lukes, MD  INVOKANA 100 MG TABS tablet TAKE 1 TABLET BY MOUTH DAILY. 11/30/15   Mosie Lukes, MD  JANUVIA 100 MG tablet TAKE 1 TABLET BY MOUTH DAILY. 11/09/15   Mosie Lukes, MD  Lancet Device MISC by Does not apply route. Use to test blood sugar three times daily     Historical Provider, MD  meclizine (ANTIVERT) 25 MG tablet Take 1 tablet (25 mg total) by mouth 3 (three) times daily as needed for dizziness. 04/16/15   Mosie Lukes, MD  metFORMIN (GLUCOPHAGE-XR) 500 MG 24 hr tablet TAKE 2 TABLETS BY MOUTH TWICE DAILY AND 1 TABLET AT NOON 11/30/15   Mosie Lukes, MD  Multiple Vitamin (MULTIVITAMIN) tablet Take 1 tablet by mouth daily.      Historical Provider, MD  naproxen (NAPROSYN) 500 MG tablet Take 1 tablet (500 mg total) by mouth 2 (two) times daily as needed for mild pain or moderate pain (with food). 11/10/15   Mosie Lukes, MD  nitroGLYCERIN (NITROSTAT) 0.4 MG SL tablet Place 1 tablet (0.4 mg total) under the tongue every 5 (five) minutes as needed for chest pain.  08/22/13   Mosie Lukes, MD  pantoprazole (PROTONIX) 40 MG tablet TAKE 1 TABLET BY MOUTH DAILY 12/28/15   Mosie Lukes, MD  traMADol (ULTRAM) 50 MG tablet TAKE 2 TABLETS BY MOUTH TWICE DAILY AS NEEDED 08/17/15   Mosie Lukes, MD  TRUETEST TEST test strip USE TO TEST BLOOD SUGAR THREE TIMES A DAY 05/05/15   Mosie Lukes, MD   BP 155/80 mmHg  Pulse 86  Temp(Src) 98.8 F (37.1 C) (Oral)  Resp 16  Ht '5\' 4"'$  (1.626 m)  Wt 232 lb (105.235 kg)  BMI 39.80 kg/m2  SpO2 94% Physical Exam  Constitutional: She is oriented to person, place, and time. She appears well-developed and well-nourished. No distress.  HENT:  Head: Normocephalic and atraumatic.  Eyes: EOM are normal. Pupils are equal, round,  and reactive to light.  Neck: Normal range of motion. Neck supple.  Cardiovascular: Normal rate and regular rhythm.  Exam reveals no gallop and no friction rub.   No murmur heard. Pulmonary/Chest: Effort normal. She has no wheezes. She has no rales.  Abdominal: Soft. She exhibits no distension. There is no tenderness. There is no rebound and no guarding.  Musculoskeletal: She exhibits no edema or tenderness.  Neurological: She is alert and oriented to person, place, and time. She has normal strength. No cranial nerve deficit or sensory deficit. She displays a negative Romberg sign. Coordination and gait normal. GCS eye subscore is 4. GCS verbal subscore is 5. GCS motor subscore is 6. She displays no Babinski's sign on the right side. She displays no Babinski's sign on the left side.  Reflex Scores:      Tricep reflexes are 2+ on the right side and 2+ on the left side.      Bicep reflexes are 2+ on the right side and 2+ on the left side.      Brachioradialis reflexes are 2+ on the right side and 2+ on the left side.      Patellar reflexes are 2+ on the right side and 2+ on the left side.      Achilles reflexes are 2+ on the right side and 2+ on the left side. Skin: Skin is warm and dry. She is not  diaphoretic.  Psychiatric: She has a normal mood and affect. Her behavior is normal.  Nursing note and vitals reviewed.   ED Course  Procedures (including critical care time) Labs Review Labs Reviewed - No data to display  Imaging Review Ct Head Wo Contrast  12/31/2015  CLINICAL DATA:  Headache and nausea for 3 days, hypertension, diabetes mellitus, asthma, former smoker EXAM: CT HEAD WITHOUT CONTRAST TECHNIQUE: Contiguous axial images were obtained from the base of the skull through the vertex without intravenous contrast. COMPARISON:  None FINDINGS: Normal ventricular morphology. No midline shift or mass effect. Normal appearance of brain parenchyma. Minimal motion artifact. No intracranial hemorrhage, mass lesion, or evidence acute infarction. No extra-axial fluid collections. Bones and sinuses unremarkable. IMPRESSION: Normal exam. Electronically Signed   By: Lavonia Dana M.D.   On: 12/31/2015 18:07   I have personally reviewed and evaluated these images and lab results as part of my medical decision-making.   EKG Interpretation None      MDM   Final diagnoses:  Concussion, without loss of consciousness, initial encounter    55 yo F with a chief complaints of what sounds like postconcussive syndrome. CT of the head was negative patient feels better after a headache cocktail. Will discharge the patient home. PCP follow-up.  9:57 PM:  I have discussed the diagnosis/risks/treatment options with the patient and family and believe the pt to be eligible for discharge home to follow-up with PCP. We also discussed returning to the ED immediately if new or worsening sx occur. We discussed the sx which are most concerning (e.g., sudden worsening pain, fever, inability to tolerate by mouth) that necessitate immediate return. Medications administered to the patient during their visit and any new prescriptions provided to the patient are listed below.  Medications given during this  visit Medications  prochlorperazine (COMPAZINE) injection 10 mg (10 mg Intravenous Given 12/31/15 1759)  diphenhydrAMINE (BENADRYL) injection 25 mg (25 mg Intravenous Given 12/31/15 1800)  sodium chloride 0.9 % bolus 1,000 mL (0 mLs Intravenous Stopped 12/31/15 1926)    Discharge Medication List  as of 12/31/2015  7:18 PM      The patient appears reasonably screen and/or stabilized for discharge and I doubt any other medical condition or other [facility] requiring further screening, evaluation, or treatment in the ED at this time prior to discharge.      Deno Etienne, DO 12/31/15 2157

## 2015-12-31 NOTE — Discharge Instructions (Signed)
Take 4 over the counter ibuprofen tablets 3 times a day or 2 over-the-counter naproxen tablets twice a day for pain.  Post-Concussion Syndrome Post-concussion syndrome describes the symptoms that can occur after a head injury. These symptoms can last from weeks to months. CAUSES  It is not clear why some head injuries cause post-concussion syndrome. It can occur whether your head injury was mild or severe and whether you were wearing head protection or not.  SIGNS AND SYMPTOMS  Memory difficulties.  Dizziness.  Headaches.  Double vision or blurry vision.  Sensitivity to light.  Hearing difficulties.  Depression.  Tiredness.  Weakness.  Difficulty with concentration.  Difficulty sleeping or staying asleep.  Vomiting.  Poor balance or instability on your feet.  Slow reaction time.  Difficulty learning and remembering things you have heard. DIAGNOSIS  There is no test to determine whether you have post-concussion syndrome. Your health care provider may order an imaging scan of your brain, such as a CT scan, to check for other problems that may be causing your symptoms (such as a severe injury inside your skull). TREATMENT  Usually, these problems disappear over time without medical care. Your health care provider may prescribe medicine to help ease your symptoms. It is important to follow up with a neurologist to evaluate your recovery and address any lingering symptoms or issues. HOME CARE INSTRUCTIONS   Take medicines only as directed by your health care provider. Do not take aspirin. Aspirin can slow blood clotting.  Sleep with your head slightly elevated to help with headaches.  Avoid any situation where there is potential for another head injury. This includes football, hockey, soccer, basketball, martial arts, downhill snow sports, and horseback riding. Your condition will get worse every time you experience a concussion. You should avoid these activities until you  are evaluated by the appropriate follow-up health care providers.  Keep all follow-up visits as directed by your health care provider. This is important. SEEK MEDICAL CARE IF:  You have increased problems paying attention or concentrating.  You have increased difficulty remembering or learning new information.  You need more time to complete tasks or assignments than before.  You have increased irritability or decreased ability to cope with stress.  You have more symptoms than before. Seek medical care if you have any of the following symptoms for more than two weeks after your injury:  Lasting (chronic) headaches.  Dizziness or balance problems.  Nausea.  Vision problems.  Increased sensitivity to noise or light.  Depression or mood swings.  Anxiety or irritability.  Memory problems.  Difficulty concentrating or paying attention.  Sleep problems.  Feeling tired all the time. SEEK IMMEDIATE MEDICAL CARE IF:  You have confusion or unusual drowsiness.  Others find it difficult to wake you up.  You have nausea or persistent, forceful vomiting.  You feel like you are moving when you are not (vertigo). Your eyes may move rapidly back and forth.  You have convulsions or faint.  You have severe, persistent headaches that are not relieved by medicine.  You cannot use your arms or legs normally.  One of your pupils is larger than the other.  You have clear or bloody discharge from your nose or ears.  Your problems are getting worse, not better. MAKE SURE YOU:  Understand these instructions.  Will watch your condition.  Will get help right away if you are not doing well or get worse.   This information is not intended to replace advice  given to you by your health care provider. Make sure you discuss any questions you have with your health care provider.   Document Released: 02/25/2002 Document Revised: 09/26/2014 Document Reviewed: 12/11/2013 Elsevier  Interactive Patient Education [employer].

## 2015-12-31 NOTE — ED Notes (Signed)
Patient transported to CT 

## 2015-12-31 NOTE — Patient Instructions (Signed)
I want you to go to ED now for evaluation of severe HA. I would recommend our ED in this building since you don't have anyone to pick you up now and take you to [facility] ED.  I have talked with ED MD today and he is aware of your presentation. After work up here there is a chance you may be transferred for other studies.  Follow up here after ED  Evaluation. Got to our ED now. Show them this summary of visit.

## 2015-12-31 NOTE — ED Notes (Signed)
HA "all over" x 3 days-vomited x 1 today-sent from PCP in building for evaluation-states she did hit head on paper towel dispenser Sunday-denies LOC-NAD-steady gait

## 2015-12-31 NOTE — Progress Notes (Signed)
Subjective:    Patient ID: Eileen Richards, female    DOB: 1960-10-16, 55 y.o.   MRN: [id number]  HPI  Pt has some on and off ha for 3 days. Pt states in past very rare HA and very mild in past. No light sensitivity or sound sensitivity. No dizziness. Pt states last 3 days level 2-4 ha. But today pain level is 8-9/10 pain. This am woke up nausea with ha. She vomited one time today on the way here.  Pt can't remember when last ha was before this one.  Pt ha frontal ha and around her eyes.   No vision changed. No blurred vision.  No allergy symptoms recently or uri type signs or symptoms.  Bp check by MA today initially showed no elevation.  Yesterday. She felt like was about to get earache.    Review of Systems  Constitutional: Negative for fever, chills and fatigue.  HENT: Negative for congestion, ear discharge, ear pain, sinus pressure, sneezing, sore throat and trouble swallowing.   Respiratory: Negative for cough, chest tightness and wheezing.   Gastrointestinal: Positive for nausea and vomiting.  Musculoskeletal: Negative for back pain.  Neurological: Positive for headaches. Negative for dizziness, speech difficulty, weakness, light-headedness and numbness.  Hematological: Negative for adenopathy. Does not bruise/bleed easily.  Psychiatric/Behavioral: Negative for behavioral problems and confusion.    Past Medical History  Diagnosis Date  . Diabetes mellitus   . Hypertension   . Hyperlipemia   . Atypical chest pain     normal stress echo 02/2009 -LV size was normal LV global systolic function was normal- Normal wall motion; no LV regional wall motion abnormalities  . Asthma     childhood  . GERD (gastroesophageal reflux disease)   . Pain of right heel 10/14/2012  . Achilles tendonitis 10/14/2012  . Chest pain, atypical 08/24/2013  . Onychomycosis 04/17/2014  . Thoracic outlet syndrome 11/03/2014  . Benign paroxysmal positional vertigo 04/26/2015  . Hand pain, left  11/10/2015  . Preventative health care 11/22/2015  . Urinary urgency 12/15/2015     Social History   Social History  . Marital Status: Single    Spouse Name: N/A  . Number of Children: N/A  . Years of Education: N/A   Occupational History  . Not on file.   Social History Main Topics  . Smoking status: Former Research scientist (life sciences)  . Smokeless tobacco: Never Used  . Alcohol Use: No  . Drug Use: No  . Sexual Activity: Not on file   Other Topics Concern  . Not on file   Social History Narrative    Past Surgical History  Procedure Laterality Date  . Abdominal hysterectomy    . Dilation and curettage of uterus      Lysis of Adhesions  . Robotic assisted salpingo oopherectomy  07/23/2012    Procedure: ROBOTIC ASSISTED SALPINGO OOPHERECTOMY;  Surgeon: Marvene Staff, MD;  Location: [facility];  Service: Gynecology;  Laterality: Bilateral;  . Robotic assisted laparoscopic lysis of adhesion  07/23/2012    Procedure: ROBOTIC ASSISTED LAPAROSCOPIC LYSIS OF ADHESION;  Surgeon: Marvene Staff, MD;  Location: [facility];  Service: Gynecology;  Laterality: N/A;  . Cystoscopy  07/23/2012    Procedure: CYSTOSCOPY;  Surgeon: Marvene Staff, MD;  Location: [facility];  Service: Gynecology;  Laterality: N/A;    Family History  Problem Relation Age of Onset  . GER disease Mother   . GER disease Father   . Heart disease Father  father has "heart problems"    No Known Allergies  Current Outpatient Prescriptions on File Prior to Visit  Medication Sig Dispense Refill  . albuterol (PROVENTIL HFA;VENTOLIN HFA) 108 (90 Base) MCG/ACT inhaler Inhale 2 puffs into the lungs every 6 (six) hours as needed for wheezing or shortness of breath. 1 Inhaler 6  . aspirin 81 MG tablet Take 81 mg by mouth daily.    Marland Kitchen atorvastatin (LIPITOR) 20 MG tablet Take 1 tablet (20 mg total) by mouth daily. 90 tablet 3  . Blood Glucose Monitoring Suppl (TRUETRACK BLOOD GLUCOSE) W/DEVICE KIT by Does not apply route.      .  carvedilol (COREG) 3.125 MG tablet TAKE 1 TABLET BY MOUTH 2 TIMES DAILY WITH A MEAL 60 tablet 11  . cyclobenzaprine (FLEXERIL) 10 MG tablet Take 1 tablet (10 mg total) by mouth at bedtime as needed for muscle spasms. 30 tablet 1  . Efinaconazole 10 % SOLN Apply 1 drop topically daily. 4 mL 11  . glimepiride (AMARYL) 4 MG tablet TAKE 1 TABLET BY MOUTH TWICE DAILY 60 tablet 2  . hydrochlorothiazide (HYDRODIURIL) 25 MG tablet TAKE 1 TABLET BY MOUTH DAILY. 90 tablet 5  . INVOKANA 100 MG TABS tablet TAKE 1 TABLET BY MOUTH DAILY. 30 tablet 5  . INVOKANA 100 MG TABS tablet TAKE 1 TABLET BY MOUTH DAILY. 30 tablet 2  . JANUVIA 100 MG tablet TAKE 1 TABLET BY MOUTH DAILY. 30 tablet 11  . Lancet Device MISC by Does not apply route. Use to test blood sugar three times daily     . meclizine (ANTIVERT) 25 MG tablet Take 1 tablet (25 mg total) by mouth 3 (three) times daily as needed for dizziness. 30 tablet 0  . metFORMIN (GLUCOPHAGE-XR) 500 MG 24 hr tablet TAKE 2 TABLETS BY MOUTH TWICE DAILY AND 1 TABLET AT NOON 150 tablet 5  . Multiple Vitamin (MULTIVITAMIN) tablet Take 1 tablet by mouth daily.      . naproxen (NAPROSYN) 500 MG tablet Take 1 tablet (500 mg total) by mouth 2 (two) times daily as needed for mild pain or moderate pain (with food). 60 tablet 1  . nitroGLYCERIN (NITROSTAT) 0.4 MG SL tablet Place 1 tablet (0.4 mg total) under the tongue every 5 (five) minutes as needed for chest pain. 25 tablet 3  . pantoprazole (PROTONIX) 40 MG tablet TAKE 1 TABLET BY MOUTH DAILY 30 tablet 5  . traMADol (ULTRAM) 50 MG tablet TAKE 2 TABLETS BY MOUTH TWICE DAILY AS NEEDED 60 tablet PRN  . TRUETEST TEST test strip USE TO TEST BLOOD SUGAR THREE TIMES A DAY 100 each 11   No current facility-administered medications on file prior to visit.    BP 124/86 mmHg  Pulse 91  Temp(Src) 98.1 F (36.7 C) (Oral)  Ht '5\' 4"'$  (1.626 m)  Wt 232 lb (105.235 kg)  BMI 39.80 kg/m2  SpO2 96%      Objective:   Physical  Exam  General Mental Status- Alert. General Appearance- Not in acute distress.   Eyes- horizontal nystagmus. Perrl both eyes  Lt tm mild central redness  Skin General: Color- Normal Color. Moisture- Normal Moisture.  Neck Carotid Arteries- Normal color. Moisture- Normal Moisture. No carotid bruits. No JVD.  Chest and Lung Exam Auscultation: Breath Sounds:-Normal.  Cardiovascular Auscultation:Rythm- Regular. Murmurs & Other Heart Sounds:Auscultation of the heart reveals- No Murmurs.  Abdomen Inspection:-Inspeection Normal. Palpation/Percussion:Note:No mass. Palpation and Percussion of the abdomen reveal- Non Tender, Non Distended + BS, no rebound  or guarding.    Neurologic Cranial Nerve exam:- CN III-XII intact(No nystagmus), symmetric smile. Drift Test:- No drift. Romberg Exam:- Negative Heal to Toe Gait exam:-poor heal to toe. Finger to Nose:- Normal/Intact Strength:- 5/5 equal and symmetric strength both upper and lower extremities.      Assessment & Plan:  I want you to go to ED now for evaluation of severe HA. I would recommend our ED in this building since you don't have anyone to pick you up now and take you to [facility] ED.  I have talked with ED MD today and he is aware of your presentation. After work up here there is a chance you may be transferred for other studies.  Follow up here after ED  Evaluation. Got to our ED now. Show them this summary of visit.

## 2016-01-05 ENCOUNTER — Encounter: Payer: Self-pay | Admitting: Family Medicine

## 2016-01-05 ENCOUNTER — Ambulatory Visit (INDEPENDENT_AMBULATORY_CARE_PROVIDER_SITE_OTHER): Payer: 59 | Admitting: Family Medicine

## 2016-01-05 VITALS — BP 110/68 | HR 74 | Temp 98.5°F | Ht 64.0 in | Wt 233.0 lb

## 2016-01-05 DIAGNOSIS — E669 Obesity, unspecified: Secondary | ICD-10-CM

## 2016-01-05 DIAGNOSIS — I10 Essential (primary) hypertension: Secondary | ICD-10-CM

## 2016-01-05 DIAGNOSIS — J018 Other acute sinusitis: Secondary | ICD-10-CM | POA: Diagnosis not present

## 2016-01-05 DIAGNOSIS — E119 Type 2 diabetes mellitus without complications: Secondary | ICD-10-CM | POA: Diagnosis not present

## 2016-01-05 DIAGNOSIS — E1169 Type 2 diabetes mellitus with other specified complication: Secondary | ICD-10-CM

## 2016-01-05 MED ORDER — DOXYCYCLINE HYCLATE 100 MG PO TABS: 100.0000 mg | ORAL_TABLET | Freq: Two times a day (BID) | ORAL | Status: DC

## 2016-01-05 MED ORDER — PROMETHAZINE HCL 25 MG PO TABS: 25.0000 mg | ORAL_TABLET | Freq: Three times a day (TID) | ORAL | Status: DC | PRN

## 2016-01-05 MED FILL — DOXYCYCLINE HYCLATE 100 MG: 100 | 14 days supply | Qty: 28 | Fill #0

## 2016-01-05 MED FILL — PROMETHAZINE 25 MG TABLET: 25 | 7 days supply | Qty: 20 | Fill #0

## 2016-01-05 NOTE — Progress Notes (Signed)
Subjective:    Patient ID: Eileen Richards, female    DOB: Aug 09, 1961, 55 y.o.   MRN: [id number]  Chief Complaint  Patient presents with  . Follow-up    ED, headaches and nausea    HPI Patient is in today for follow up from Emergency  Department due to headaches.  Patient reports hitting her head Sunday, and Thursday she woke up and had a bad headache, with vomiting, resolved nausea in hospital but patient was still having a headaches for a few days after leaving the hospital.  Patient also reports she feels like something ids in throat and it hurts when cough, also has some left ear pain.  Denies CP/palp/SOB/fevers/GI or GU c/o. Taking meds as prescribed   Past Medical History  Diagnosis Date  . Diabetes mellitus   . Hypertension   . Hyperlipemia   . Atypical chest pain     normal stress echo 02/2009 -LV size was normal LV global systolic function was normal- Normal wall motion; no LV regional wall motion abnormalities  . Asthma     childhood  . GERD (gastroesophageal reflux disease)   . Pain of right heel 10/14/2012  . Achilles tendonitis 10/14/2012  . Chest pain, atypical 08/24/2013  . Onychomycosis 04/17/2014  . Thoracic outlet syndrome 11/03/2014  . Benign paroxysmal positional vertigo 04/26/2015  . Hand pain, left 11/10/2015  . Preventative health care 11/22/2015  . Urinary urgency 12/15/2015  . Sinusitis, acute 01/17/2016    Past Surgical History  Procedure Laterality Date  . Abdominal hysterectomy    . Dilation and curettage of uterus      Lysis of Adhesions  . Robotic assisted salpingo oopherectomy  07/23/2012    Procedure: ROBOTIC ASSISTED SALPINGO OOPHERECTOMY;  Surgeon: Marvene Staff, MD;  Location: [facility];  Service: Gynecology;  Laterality: Bilateral;  . Robotic assisted laparoscopic lysis of adhesion  07/23/2012    Procedure: ROBOTIC ASSISTED LAPAROSCOPIC LYSIS OF ADHESION;  Surgeon: Marvene Staff, MD;  Location: [facility];  Service: Gynecology;  Laterality:  N/A;  . Cystoscopy  07/23/2012    Procedure: CYSTOSCOPY;  Surgeon: Marvene Staff, MD;  Location: [facility];  Service: Gynecology;  Laterality: N/A;    Family History  Problem Relation Age of Onset  . GER disease Mother   . GER disease Father   . Heart disease Father     father has "heart problems"    Social History   Social History  . Marital Status: Single    Spouse Name: N/A  . Number of Children: N/A  . Years of Education: N/A   Occupational History  . Not on file.   Social History Main Topics  . Smoking status: Former Research scientist (life sciences)  . Smokeless tobacco: Never Used  . Alcohol Use: No  . Drug Use: No  . Sexual Activity: Not on file   Other Topics Concern  . Not on file   Social History Narrative    Outpatient Prescriptions Prior to Visit  Medication Sig Dispense Refill  . albuterol (PROVENTIL HFA;VENTOLIN HFA) 108 (90 Base) MCG/ACT inhaler Inhale 2 puffs into the lungs every 6 (six) hours as needed for wheezing or shortness of breath. (Patient not taking: Reported on 01/12/2016) 1 Inhaler 6  . aspirin 81 MG tablet Take 81 mg by mouth daily.    Marland Kitchen atorvastatin (LIPITOR) 20 MG tablet Take 1 tablet (20 mg total) by mouth daily. 90 tablet 3  . Blood Glucose Monitoring Suppl (TRUETRACK BLOOD GLUCOSE) W/DEVICE KIT by  Does not apply route.      . carvedilol (COREG) 3.125 MG tablet TAKE 1 TABLET BY MOUTH 2 TIMES DAILY WITH A MEAL 60 tablet 11  . cyclobenzaprine (FLEXERIL) 10 MG tablet Take 1 tablet (10 mg total) by mouth at bedtime as needed for muscle spasms. (Patient not taking: Reported on 01/12/2016) 30 tablet 1  . Efinaconazole 10 % SOLN Apply 1 drop topically daily. (Patient not taking: Reported on 01/12/2016) 4 mL 11  . glimepiride (AMARYL) 4 MG tablet TAKE 1 TABLET BY MOUTH TWICE DAILY 60 tablet 2  . hydrochlorothiazide (HYDRODIURIL) 25 MG tablet TAKE 1 TABLET BY MOUTH DAILY. 90 tablet 5  . INVOKANA 100 MG TABS tablet TAKE 1 TABLET BY MOUTH DAILY. 30 tablet 5  . INVOKANA  100 MG TABS tablet TAKE 1 TABLET BY MOUTH DAILY. 30 tablet 2  . JANUVIA 100 MG tablet TAKE 1 TABLET BY MOUTH DAILY. 30 tablet 11  . Lancet Device MISC by Does not apply route. Use to test blood sugar three times daily     . meclizine (ANTIVERT) 25 MG tablet Take 1 tablet (25 mg total) by mouth 3 (three) times daily as needed for dizziness. (Patient not taking: Reported on 01/12/2016) 30 tablet 0  . metFORMIN (GLUCOPHAGE-XR) 500 MG 24 hr tablet TAKE 2 TABLETS BY MOUTH TWICE DAILY AND 1 TABLET AT NOON 150 tablet 5  . Multiple Vitamin (MULTIVITAMIN) tablet Take 1 tablet by mouth daily. Reported on 01/12/2016    . naproxen (NAPROSYN) 500 MG tablet Take 1 tablet (500 mg total) by mouth 2 (two) times daily as needed for mild pain or moderate pain (with food). 60 tablet 1  . nitroGLYCERIN (NITROSTAT) 0.4 MG SL tablet Place 1 tablet (0.4 mg total) under the tongue every 5 (five) minutes as needed for chest pain. (Patient not taking: Reported on 01/12/2016) 25 tablet 3  . pantoprazole (PROTONIX) 40 MG tablet TAKE 1 TABLET BY MOUTH DAILY 30 tablet 5  . traMADol (ULTRAM) 50 MG tablet TAKE 2 TABLETS BY MOUTH TWICE DAILY AS NEEDED 60 tablet PRN  . TRUETEST TEST test strip USE TO TEST BLOOD SUGAR THREE TIMES A DAY 100 each 11   No facility-administered medications prior to visit.    No Known Allergies  Review of Systems  Constitutional: Negative for fever and malaise/fatigue.  HENT: Positive for congestion and sore throat.   Eyes: Negative for blurred vision.  Respiratory: Positive for cough and sputum production. Negative for shortness of breath.   Cardiovascular: Negative for chest pain, palpitations and leg swelling.  Gastrointestinal: Positive for nausea. Negative for abdominal pain and blood in stool.  Genitourinary: Negative for dysuria and frequency.  Musculoskeletal: Negative for falls.  Skin: Negative for rash.  Neurological: Positive for headaches. Negative for dizziness and loss of  consciousness.  Endo/Heme/Allergies: Negative for environmental allergies.  Psychiatric/Behavioral: Negative for depression. The patient is not nervous/anxious.        Objective:    Physical Exam  Constitutional: She is oriented to person, place, and time. She appears well-developed and well-nourished. No distress.  HENT:  Head: Normocephalic and atraumatic.  Nose: Nose normal.  Eyes: Right eye exhibits no discharge. Left eye exhibits no discharge.  TM left erythematous, posterior auricular LN on Left, erythema   Neck: Normal range of motion. Neck supple.  Cardiovascular: Normal rate and regular rhythm.   No murmur heard. Pulmonary/Chest: Effort normal and breath sounds normal.  Abdominal: Soft. Bowel sounds are normal. There is no tenderness.  Musculoskeletal: She exhibits no edema.  Neurological: She is alert and oriented to person, place, and time.  Skin: Skin is warm and dry.  Psychiatric: She has a normal mood and affect.  Nursing note and vitals reviewed.   BP 110/68 mmHg  Pulse 74  Temp(Src) 98.5 F (36.9 C) (Oral)  Ht '5\' 4"'$  (1.626 m)  Wt 233 lb (105.688 kg)  BMI 39.97 kg/m2  SpO2 97% Wt Readings from Last 3 Encounters:  01/12/16 234 lb 9.6 oz (106.414 kg)  01/05/16 233 lb (105.688 kg)  12/31/15 232 lb (105.235 kg)     Lab Results  Component Value Date   WBC 11.9* 11/12/2015   HGB 14.8 11/12/2015   HCT 45.6 11/12/2015   PLT 401.0* 11/12/2015   GLUCOSE 107* 11/12/2015   CHOL 158 11/12/2015   TRIG 103.0 11/12/2015   HDL 40.40 11/12/2015   LDLCALC 97 11/12/2015   ALT 16 11/12/2015   AST 14 11/12/2015   NA 140 11/12/2015   K 3.9 11/12/2015   CL 102 11/12/2015   CREATININE 0.74 11/12/2015   BUN 13 11/12/2015   CO2 31 11/12/2015   TSH 1.88 11/12/2015   HGBA1C 8.0* 11/12/2015   MICROALBUR 0.9 11/12/2015    Lab Results  Component Value Date   TSH 1.88 11/12/2015   Lab Results  Component Value Date   WBC 11.9* 11/12/2015   HGB 14.8 11/12/2015    HCT 45.6 11/12/2015   MCV 91.0 11/12/2015   PLT 401.0* 11/12/2015   Lab Results  Component Value Date   NA 140 11/12/2015   K 3.9 11/12/2015   CO2 31 11/12/2015   GLUCOSE 107* 11/12/2015   BUN 13 11/12/2015   CREATININE 0.74 11/12/2015   BILITOT 0.6 11/12/2015   ALKPHOS 85 11/12/2015   AST 14 11/12/2015   ALT 16 11/12/2015   PROT 7.9 11/12/2015   ALBUMIN 4.3 11/12/2015   CALCIUM 9.7 11/12/2015   GFR 105.57 11/12/2015   Lab Results  Component Value Date   CHOL 158 11/12/2015   Lab Results  Component Value Date   HDL 40.40 11/12/2015   Lab Results  Component Value Date   LDLCALC 97 11/12/2015   Lab Results  Component Value Date   TRIG 103.0 11/12/2015   Lab Results  Component Value Date   CHOLHDL 4 11/12/2015   Lab Results  Component Value Date   HGBA1C 8.0* 11/12/2015       Assessment & Plan:   Problem List Items Addressed This Visit    Diabetes mellitus type 2 in obese ([place])    hgba1c acceptable, minimize simple carbs. Increase exercise as tolerated. Continue current meds      Essential hypertension - Primary    Well controlled, no changes to meds. Encouraged heart healthy diet such as the DASH diet and exercise as tolerated.       Obesity    Encouraged DASH diet, decrease po intake and increase exercise as tolerated. Needs 7-8 hours of sleep nightly. Avoid trans fats, eat small, frequent meals every 4-5 hours with lean proteins, complex carbs and healthy fats. Minimize simple carbs      Sinusitis, acute    Started on Doxycycline. Encouraged increased rest and hydration, add probiotics, zinc such as Coldeze or Xicam. Treat fevers as needed      Relevant Medications   promethazine (PHENERGAN) 25 MG tablet   doxycycline (VIBRA-TABS) 100 MG tablet      I am having Ms. Amon start on promethazine and doxycycline.  I am also having her maintain her Lancet Device, TRUETRACK BLOOD GLUCOSE, multivitamin, nitroGLYCERIN, aspirin, Efinaconazole,  cyclobenzaprine, INVOKANA, hydrochlorothiazide, meclizine, TRUETEST TEST, traMADol, atorvastatin, JANUVIA, carvedilol, naproxen, metFORMIN, INVOKANA, albuterol, pantoprazole, and glimepiride.  Meds ordered this encounter  Medications  . promethazine (PHENERGAN) 25 MG tablet    Sig: Take 1 tablet (25 mg total) by mouth every 8 (eight) hours as needed for nausea or vomiting.    Dispense:  20 tablet    Refill:  0  . doxycycline (VIBRA-TABS) 100 MG tablet    Sig: Take 1 tablet (100 mg total) by mouth 2 (two) times daily. X 2 weeks    Dispense:  28 tablet    Refill:  0     Penni Homans, MD'

## 2016-01-05 NOTE — Patient Instructions (Addendum)

## 2016-01-05 NOTE — Progress Notes (Signed)
Pre visit review using our clinic review tool, if applicable. No additional management support is needed unless otherwise documented below in the visit note. 

## 2016-01-12 ENCOUNTER — Other Ambulatory Visit: Payer: Self-pay | Admitting: *Deleted

## 2016-01-12 NOTE — Patient Outreach (Addendum)
[facility]) Care Management   01/12/2016  Eileen Richards May 01, 1961 [id number]  Eileen Richards is an 55 y.o. female who presents to the [facility] Care Management office for routine Link To Wellness follow up for self management assistance with Type II DM, HTN and hyperlipidemia and morbid obesity.  Subjective: Eileen Richards says she was seen recently in the ED (on 4/13/) after she hit her head on a towel dispenser at work and then began experiencing nausea and vomiting. The CT of her head was negative. Says she saw Dr. Abner Greenspan in follow up on 4/18 and she was told the symptoms were likely due to a sinus infection that she is currently treating with antibiotics. (doxycycline) She says she will see Dr. Abner Greenspan again in June. She says she checks her blood sugar rarely, and does not exercise but is adherent with her medications. She is not opposed to injections if this is needed to improve blood sugar control. 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  Objective:   Review of Systems  Constitutional: Negative.     Physical Exam  Constitutional: She is oriented to person, place, and time. She appears well-developed and well-nourished.  Respiratory: Effort normal.  Neurological: She is alert and oriented to person, place, and time.  Skin: Skin is warm and dry.  Psychiatric: She has a normal mood and affect. Her behavior is normal. Judgment and thought content normal.   Filed Weights   01/12/16 1602  Weight: 234 lb 9.6 oz (106.414 kg)   Filed Vitals:   01/12/16 1602  BP: 105/75   Encounter Medications:   Outpatient Encounter Prescriptions as of 01/12/2016  Medication Sig Note  . aspirin 81 MG tablet Take 81 mg by mouth daily.   Marland Kitchen atorvastatin (LIPITOR) 20 MG tablet Take 1 tablet (20 mg total) by mouth daily.   . carvedilol (COREG) 3.125 MG  tablet TAKE 1 TABLET BY MOUTH 2 TIMES DAILY WITH A MEAL   . doxycycline (VIBRA-TABS) 100 MG tablet Take 1 tablet (100 mg total) by mouth 2 (two) times daily. X 2 weeks   . glimepiride (AMARYL) 4 MG tablet TAKE 1 TABLET BY MOUTH TWICE DAILY   . hydrochlorothiazide (HYDRODIURIL) 25 MG tablet TAKE 1 TABLET BY MOUTH DAILY.   Marland Kitchen INVOKANA 100 MG TABS tablet TAKE 1 TABLET BY MOUTH DAILY.   Marland Kitchen JANUVIA 100 MG tablet TAKE 1 TABLET BY MOUTH DAILY.   . pantoprazole (PROTONIX) 40 MG tablet TAKE 1 TABLET BY MOUTH DAILY   . promethazine (PHENERGAN) 25 MG tablet Take 1 tablet (25 mg total) by mouth every 8 (eight) hours as needed for nausea or vomiting.   . traMADol (ULTRAM) 50 MG tablet TAKE 2 TABLETS BY MOUTH TWICE DAILY AS NEEDED   . TRUETEST TEST test strip USE TO TEST BLOOD SUGAR THREE TIMES A DAY   . albuterol (PROVENTIL HFA;VENTOLIN HFA) 108 (90 Base) MCG/ACT inhaler Inhale 2 puffs into the lungs every 6 (six) hours as needed for wheezing or shortness of breath. (Patient not taking: Reported on 01/12/2016)   . Blood Glucose Monitoring Suppl (TRUETRACK BLOOD GLUCOSE) W/DEVICE KIT by Does not apply route.     . cyclobenzaprine (FLEXERIL) 10 MG tablet Take 1 tablet (10 mg total) by mouth at bedtime as needed for muscle spasms. (Patient not taking: Reported on 01/12/2016)   . Efinaconazole 10 % SOLN Apply  1 drop topically daily. (Patient not taking: Reported on 01/12/2016)   . INVOKANA 100 MG TABS tablet TAKE 1 TABLET BY MOUTH DAILY. 7/61/6073: Duplicate order  . Lancet Device MISC by Does not apply route. Use to test blood sugar three times daily    . meclizine (ANTIVERT) 25 MG tablet Take 1 tablet (25 mg total) by mouth 3 (three) times daily as needed for dizziness. (Patient not taking: Reported on 01/12/2016) 04/30/2015: Takes as needed  . metFORMIN (GLUCOPHAGE-XR) 500 MG 24 hr tablet TAKE 2 TABLETS BY MOUTH TWICE DAILY AND 1 TABLET AT NOON   . Multiple Vitamin (MULTIVITAMIN) tablet Take 1 tablet by mouth daily.  Reported on 01/12/2016   . naproxen (NAPROSYN) 500 MG tablet Take 1 tablet (500 mg total) by mouth 2 (two) times daily as needed for mild pain or moderate pain (with food).   . nitroGLYCERIN (NITROSTAT) 0.4 MG SL tablet Place 1 tablet (0.4 mg total) under the tongue every 5 (five) minutes as needed for chest pain. (Patient not taking: Reported on 01/12/2016)    No facility-administered encounter medications on file as of 01/12/2016.    Functional Status:   In your present state of health, do you have any difficulty performing the following activities: 08/06/2015 04/30/2015  Hearing? N N  Vision? N N  Difficulty concentrating or making decisions? N N  Walking or climbing stairs? N N  Dressing or bathing? N N  Doing errands, shopping? N N    Fall/Depression Screening:    PHQ 2/9 Scores 04/30/2015 01/22/2015  PHQ - 2 Score 0 0    Assessment:   [employer] employee and Link To Wellness member with morbid obesity, Type II DM, HTN and hyperlipidemia. Meeting treatment targets for hyperlipidemia and HTN. Slight improvement in Hgb A1C to 8.0% on 11/12/15.  Plan:  [facility] CM Care Plan Problem One        Most Recent Value   Care Plan Problem One  Member with Type II DM, HTN, hyperlipidemia and morbid obesity (BMI= 40.3), not meeting treatment Hgb A1C target as evidenced by most recent Hgb A1C= 8.0% on 11/12/15, meeting treatment targets for hyperlipidemia as evidenced by normal lipid profile on 2/23 on statin and HTN well controlled  as evidenced by BP readings <140/<90   Role Documenting the Problem One  Care Management [place] for Problem One  Active   [facility] Long Term Goal (31-90 days)  Improved glycemic control as evidenced by improved Hgb A1C,   Hgb A1C < 7.5% at next check, BP readings consistently meeting targets of <140/<90, normal lipid profile at next assessment, and evidence of no weight gain or weight loss at next measurement   [facility] Long Term Goal Start Date  01/12/16    Interventions for Problem One Long Term Goal  reviewed changes to health history, reviewed medications and assessed adherence, reviewed results of labs drawn 2/23 including Hgb A1C, CMET, CBC, lipid profile and urine albumin, reviewed MD note related to addition of insulin or Victoza to improve glycemic control, discussed in detail mechanism of action of Victoza, dosage, schedule and common side effects including nausea, vomiting, diarrhea, decreased appetite and weight loss, provided handout on Victoza for Eileen Richards to review prior to her appointment with Dr. Charlett Blake, reviewed upcoming appointments for labs on 5/24 and with Dr. Charlett Blake on 5/30, will arrange for Link To Wellness follow up after Eileen Richards sees Dr. Charlett Blake in May        RNCM to fax  today's office visit note to Dr. Charlett Blake. RNCM will meet quarterly and as needed with patient per Link To Wellness program guidelines to assist with Type II DM, HTN, hyperlipidemia and weight  self-management and assess patient's progress toward mutually set goals.

## 2016-01-17 ENCOUNTER — Encounter: Payer: Self-pay | Admitting: Family Medicine

## 2016-01-17 DIAGNOSIS — J019 Acute sinusitis, unspecified: Secondary | ICD-10-CM | POA: Insufficient documentation

## 2016-01-17 HISTORY — DX: Acute sinusitis, unspecified: J01.90

## 2016-01-17 NOTE — Assessment & Plan Note (Signed)
Encouraged DASH diet, decrease po intake and increase exercise as tolerated. Needs 7-8 hours of sleep nightly. Avoid trans fats, eat small, frequent meals every 4-5 hours with lean proteins, complex carbs and healthy fats. Minimize simple carbs 

## 2016-01-17 NOTE — Assessment & Plan Note (Signed)
Well controlled, no changes to meds. Encouraged heart healthy diet such as the DASH diet and exercise as tolerated.  °

## 2016-01-17 NOTE — Assessment & Plan Note (Signed)
hgba1c acceptable, minimize simple carbs. Increase exercise as tolerated. Continue current meds 

## 2016-01-17 NOTE — Assessment & Plan Note (Signed)
Started on Doxycycline. Encouraged increased rest and hydration, add probiotics, zinc such as Coldeze or Xicam. Treat fevers as needed 

## 2016-01-18 ENCOUNTER — Other Ambulatory Visit (INDEPENDENT_AMBULATORY_CARE_PROVIDER_SITE_OTHER): Payer: 59

## 2016-01-18 ENCOUNTER — Telehealth: Payer: Self-pay | Admitting: Family Medicine

## 2016-01-18 ENCOUNTER — Other Ambulatory Visit: Payer: Self-pay | Admitting: Family Medicine

## 2016-01-18 DIAGNOSIS — J019 Acute sinusitis, unspecified: Secondary | ICD-10-CM

## 2016-01-18 LAB — CBC WITH DIFFERENTIAL/PLATELET
Basophils Absolute: 0 10*3/uL (ref 0.0–0.1)
Basophils Relative: 0.3 % (ref 0.0–3.0)
Eosinophils Absolute: 0.3 10*3/uL (ref 0.0–0.7)
Eosinophils Relative: 2.6 % (ref 0.0–5.0)
HCT: 42.4 % (ref 36.0–46.0)
Hemoglobin: 14 g/dL (ref 12.0–15.0)
Lymphocytes Relative: 20.1 % (ref 12.0–46.0)
Lymphs Abs: 2 10*3/uL (ref 0.7–4.0)
MCHC: 33.1 g/dL (ref 30.0–36.0)
MCV: 89.5 fl (ref 78.0–100.0)
Monocytes Absolute: 0.8 10*3/uL (ref 0.1–1.0)
Monocytes Relative: 8 % (ref 3.0–12.0)
Neutro Abs: 6.9 10*3/uL (ref 1.4–7.7)
Neutrophils Relative %: 69 % (ref 43.0–77.0)
Platelets: 391 10*3/uL (ref 150.0–400.0)
RBC: 4.74 Mil/uL (ref 3.87–5.11)
RDW: 14.3 % (ref 11.5–15.5)
WBC: 10.1 10*3/uL (ref 4.0–10.5)

## 2016-01-18 MED ORDER — LEVOFLOXACIN 500 MG PO TABS: 500.0000 mg | ORAL_TABLET | Freq: Every day | ORAL | Status: DC

## 2016-01-18 MED FILL — levoFLOXacin 500 MG TABS: 500 | 14 days supply | Qty: 14 | Fill #0

## 2016-01-18 NOTE — Telephone Encounter (Signed)
Patient informed of all information.   Sent in prescription, scheduled lab at the [facility] lab to be done today.

## 2016-01-18 NOTE — Telephone Encounter (Signed)
Have her repeat a CBC with diff to make sure she is not worsening then also d/c Doxycycline and start Levaquin 500 mg tabs 1 tab po qd x 14 days

## 2016-01-18 NOTE — Telephone Encounter (Signed)
Relation to [id number] Call back number: [phone] Pharmacy: [facility], [place]. [phone] (Phone) [phone] (Fax)         Reason for call:  Patient states symptoms have not improved since last office visit 01/05/16 patient experiencing  ear pain, head aches and jaw and tooth pain

## 2016-01-26 DIAGNOSIS — R102 Pelvic and perineal pain: Secondary | ICD-10-CM | POA: Diagnosis not present

## 2016-02-09 ENCOUNTER — Other Ambulatory Visit (INDEPENDENT_AMBULATORY_CARE_PROVIDER_SITE_OTHER): Payer: 59

## 2016-02-09 ENCOUNTER — Emergency Department (HOSPITAL_BASED_OUTPATIENT_CLINIC_OR_DEPARTMENT_OTHER): Payer: 59

## 2016-02-09 ENCOUNTER — Emergency Department (HOSPITAL_BASED_OUTPATIENT_CLINIC_OR_DEPARTMENT_OTHER)
Admission: EM | Admit: 2016-02-09 | Discharge: 2016-02-09 | Disposition: A | Discharge status: Home / Self Care | Payer: 59 | Attending: Emergency Medicine | Admitting: Emergency Medicine

## 2016-02-09 ENCOUNTER — Ambulatory Visit (INDEPENDENT_AMBULATORY_CARE_PROVIDER_SITE_OTHER): Payer: 59 | Admitting: Family Medicine

## 2016-02-09 ENCOUNTER — Encounter: Payer: Self-pay | Admitting: Family Medicine

## 2016-02-09 ENCOUNTER — Encounter (HOSPITAL_BASED_OUTPATIENT_CLINIC_OR_DEPARTMENT_OTHER): Payer: Self-pay | Admitting: *Deleted

## 2016-02-09 VITALS — BP 127/82 | HR 98 | Temp 98.2°F | Resp 20 | Wt 229.2 lb

## 2016-02-09 DIAGNOSIS — Y929 Unspecified place or not applicable: Secondary | ICD-10-CM | POA: Insufficient documentation

## 2016-02-09 DIAGNOSIS — Z79899 Other long term (current) drug therapy: Secondary | ICD-10-CM | POA: Insufficient documentation

## 2016-02-09 DIAGNOSIS — R1031 Right lower quadrant pain: Secondary | ICD-10-CM | POA: Diagnosis not present

## 2016-02-09 DIAGNOSIS — M79641 Pain in right hand: Secondary | ICD-10-CM | POA: Diagnosis not present

## 2016-02-09 DIAGNOSIS — N949 Unspecified condition associated with female genital organs and menstrual cycle: Secondary | ICD-10-CM | POA: Diagnosis not present

## 2016-02-09 DIAGNOSIS — J45909 Unspecified asthma, uncomplicated: Secondary | ICD-10-CM | POA: Diagnosis not present

## 2016-02-09 DIAGNOSIS — Z7982 Long term (current) use of aspirin: Secondary | ICD-10-CM | POA: Insufficient documentation

## 2016-02-09 DIAGNOSIS — Y939 Activity, unspecified: Secondary | ICD-10-CM | POA: Diagnosis not present

## 2016-02-09 DIAGNOSIS — X500XXA Overexertion from strenuous movement or load, initial encounter: Secondary | ICD-10-CM | POA: Insufficient documentation

## 2016-02-09 DIAGNOSIS — E782 Mixed hyperlipidemia: Secondary | ICD-10-CM

## 2016-02-09 DIAGNOSIS — E785 Hyperlipidemia, unspecified: Secondary | ICD-10-CM | POA: Insufficient documentation

## 2016-02-09 DIAGNOSIS — E119 Type 2 diabetes mellitus without complications: Secondary | ICD-10-CM

## 2016-02-09 DIAGNOSIS — I10 Essential (primary) hypertension: Secondary | ICD-10-CM

## 2016-02-09 DIAGNOSIS — Y999 Unspecified external cause status: Secondary | ICD-10-CM | POA: Insufficient documentation

## 2016-02-09 DIAGNOSIS — Z7984 Long term (current) use of oral hypoglycemic drugs: Secondary | ICD-10-CM | POA: Diagnosis not present

## 2016-02-09 DIAGNOSIS — S86912A Strain of unspecified muscle(s) and tendon(s) at lower leg level, left leg, initial encounter: Secondary | ICD-10-CM | POA: Insufficient documentation

## 2016-02-09 DIAGNOSIS — Z87891 Personal history of nicotine dependence: Secondary | ICD-10-CM | POA: Insufficient documentation

## 2016-02-09 DIAGNOSIS — R1903 Right lower quadrant abdominal swelling, mass and lump: Secondary | ICD-10-CM | POA: Diagnosis not present

## 2016-02-09 DIAGNOSIS — E669 Obesity, unspecified: Secondary | ICD-10-CM

## 2016-02-09 DIAGNOSIS — Z0000 Encounter for general adult medical examination without abnormal findings: Secondary | ICD-10-CM | POA: Diagnosis not present

## 2016-02-09 DIAGNOSIS — R3915 Urgency of urination: Secondary | ICD-10-CM

## 2016-02-09 DIAGNOSIS — T148XXA Other injury of unspecified body region, initial encounter: Secondary | ICD-10-CM

## 2016-02-09 DIAGNOSIS — M79605 Pain in left leg: Secondary | ICD-10-CM

## 2016-02-09 DIAGNOSIS — E1169 Type 2 diabetes mellitus with other specified complication: Secondary | ICD-10-CM

## 2016-02-09 DIAGNOSIS — M79662 Pain in left lower leg: Secondary | ICD-10-CM | POA: Diagnosis present

## 2016-02-09 LAB — COMPREHENSIVE METABOLIC PANEL
ALT: 14 U/L (ref 0–35)
AST: 17 U/L (ref 0–37)
Albumin: 4.2 g/dL (ref 3.5–5.2)
Alkaline Phosphatase: 62 U/L (ref 39–117)
BUN: 12 mg/dL (ref 6–23)
CO2: 32 mEq/L (ref 19–32)
Calcium: 9.7 mg/dL (ref 8.4–10.5)
Chloride: 102 mEq/L (ref 96–112)
Creatinine, Ser: 0.65 mg/dL (ref 0.40–1.20)
GFR: 122.49 mL/min (ref >60.00)
Glucose, Bld: 144 mg/dL — ABNORMAL HIGH (ref 70–99)
Potassium: 3.5 mEq/L (ref 3.5–5.1)
Sodium: 140 mEq/L (ref 135–145)
Total Bilirubin: 0.6 mg/dL (ref 0.2–1.2)
Total Protein: 7 g/dL (ref 6.0–8.3)

## 2016-02-09 LAB — CBC
HCT: 42.8 % (ref 36.0–46.0)
Hemoglobin: 14.1 g/dL (ref 12.0–15.0)
MCHC: 32.8 g/dL (ref 30.0–36.0)
MCV: 89.7 fl (ref 78.0–100.0)
Platelets: 478 10*3/uL — ABNORMAL HIGH (ref 150.0–400.0)
RBC: 4.77 Mil/uL (ref 3.87–5.11)
RDW: 14.1 % (ref 11.5–15.5)
WBC: 11.3 10*3/uL — ABNORMAL HIGH (ref 4.0–10.5)

## 2016-02-09 LAB — LIPID PANEL
Cholesterol: 150 mg/dL (ref 0–200)
HDL: 33.8 mg/dL — ABNORMAL LOW (ref >39.00)
LDL Cholesterol: 102 mg/dL — ABNORMAL HIGH (ref 0–99)
NonHDL: 116.41
Total CHOL/HDL Ratio: 4
Triglycerides: 70 mg/dL (ref 0.0–149.0)
VLDL: 14 mg/dL (ref 0.0–40.0)

## 2016-02-09 LAB — TSH: TSH: 1.06 u[IU]/mL (ref 0.35–4.50)

## 2016-02-09 LAB — HEMOGLOBIN A1C: Hgb A1c MFr Bld: 7.6 % — ABNORMAL HIGH (ref 4.6–6.5)

## 2016-02-09 MED ORDER — CYCLOBENZAPRINE HCL 10 MG PO TABS: 10.0000 mg | ORAL_TABLET | Freq: Two times a day (BID) | ORAL | Status: DC | PRN

## 2016-02-09 NOTE — Patient Instructions (Signed)
Please report the ED for immediate treatment.  I am concerned about a blood clot weather it is arterial or venous, i am uncertain.

## 2016-02-09 NOTE — ED Notes (Signed)
Left lower leg pain x 4 days. She was sent from Dr Leanora Cover office for further evaluation to r/o blood clot.

## 2016-02-09 NOTE — Progress Notes (Signed)
Patient ID: Eileen Richards, female   DOB: [birth year], 55 y.o.   MRN: [id number]    Eileen Richards , 1961/03/25, 55 y.o., female MRN: [id number]  CC: left leg [pain Subjective: Pt presents for an acute OV with complaints of left leg pain of 5 days duration, start last Friday. Associated symptoms include swollen left calf. Pt denies fever, shortness of breath or palpitaitons. She has no recent travel history, trauma, personal or family h/o of blood clots, or use of hormone replacement. She does take a daily baby ASA. Patient denies recent increase in activity or muscle strain. She is a diabetic last a1c  7.6 on  02/09/2016. Pt states she was at her GYN office for pelvic/groin pain (bilateral). She states she had an [place] in the GYN office, which looked like she had ovaries, which she is not suppose to have since she had an oophorectomy in 2013, by that GYN. No FH of cancer. She states she is suppose to get her CT of her pelvic schedule ASAP. She has had Abdominal hysterectomy and then a "few years " later had  ovaries removed. 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 Uncertain of work up. CT guided drainage of pelvic fluid in 2009 sent for culture, but not cytology.   No Known Allergies Social History  Substance Use Topics  . Smoking status: Former Research scientist (life sciences)  . Smokeless tobacco: Never Used  . Alcohol Use: No   Past Medical History  Diagnosis Date  . Diabetes mellitus   . Hypertension   . Hyperlipemia   . Atypical chest pain     normal stress echo 02/2009 -LV size was normal LV global systolic function was normal- Normal wall motion; no LV regional wall motion abnormalities  . Asthma     childhood  . GERD (gastroesophageal reflux disease)   . Pain of right heel 10/14/2012  . Achilles tendonitis 10/14/2012  . Chest pain, atypical 08/24/2013  . Onychomycosis 04/17/2014  . Thoracic outlet syndrome 11/03/2014  . Benign paroxysmal positional vertigo 04/26/2015  . Hand pain, left 11/10/2015  . Preventative health  care 11/22/2015  . Urinary urgency 12/15/2015  . Sinusitis, acute 01/17/2016   Past Surgical History  Procedure Laterality Date  . Abdominal hysterectomy    . Dilation and curettage of uterus      Lysis of Adhesions  . Robotic assisted salpingo oopherectomy  07/23/2012    Procedure: ROBOTIC ASSISTED SALPINGO OOPHERECTOMY;  Surgeon: Marvene Staff, MD;  Location: [facility];  Service: Gynecology;  Laterality: Bilateral;  . Robotic assisted laparoscopic lysis of adhesion  07/23/2012    Procedure: ROBOTIC ASSISTED LAPAROSCOPIC LYSIS OF ADHESION;  Surgeon: Marvene Staff, MD;  Location: [facility];  Service: Gynecology;  Laterality: N/A;  . Cystoscopy  07/23/2012    Procedure: CYSTOSCOPY;  Surgeon: Marvene Staff, MD;  Location: [facility];  Service: Gynecology;  Laterality: N/A;   Family History  Problem Relation Age of Onset  . GER disease Mother   . GER disease Father   . Heart disease Father     father has "heart problems"     Medication List       This list is accurate as of: 02/09/16  4:14 PM.  Always use your most recent med list.               albuterol 108 (90 Base) MCG/ACT inhaler  Commonly known as:  PROVENTIL HFA;VENTOLIN HFA  Inhale 2 puffs into the lungs every 6 (six) hours as needed for  wheezing or shortness of breath.     aspirin 81 MG tablet  Take 81 mg by mouth daily.     atorvastatin 20 MG tablet  Commonly known as:  LIPITOR  Take 1 tablet (20 mg total) by mouth daily.     carvedilol 3.125 MG tablet  Commonly known as:  COREG  TAKE 1 TABLET BY MOUTH 2 TIMES DAILY WITH A MEAL     cyclobenzaprine 10 MG tablet  Commonly known as:  FLEXERIL  Take 1 tablet (10 mg total) by mouth at bedtime as needed for muscle spasms.     glimepiride 4 MG tablet  Commonly known as:  AMARYL  TAKE 1 TABLET BY MOUTH TWICE DAILY     hydrochlorothiazide 25 MG tablet  Commonly known as:  HYDRODIURIL  TAKE 1 TABLET BY MOUTH DAILY.     INVOKANA 100 MG Tabs tablet  Generic  drug:  canagliflozin  TAKE 1 TABLET BY MOUTH DAILY.     INVOKANA 100 MG Tabs tablet  Generic drug:  canagliflozin  TAKE 1 TABLET BY MOUTH DAILY.     JANUVIA 100 MG tablet  Generic drug:  sitaGLIPtin  TAKE 1 TABLET BY MOUTH DAILY.     Lancet Device Misc  by Does not apply route. Use to test blood sugar three times daily     meclizine 25 MG tablet  Commonly known as:  ANTIVERT  Take 1 tablet (25 mg total) by mouth 3 (three) times daily as needed for dizziness.     metFORMIN 500 MG 24 hr tablet  Commonly known as:  GLUCOPHAGE-XR  TAKE 2 TABLETS BY MOUTH TWICE DAILY AND 1 TABLET AT NOON     multivitamin tablet  Take 1 tablet by mouth daily. Reported on 01/12/2016     naproxen 500 MG tablet  Commonly known as:  NAPROSYN  Take 1 tablet (500 mg total) by mouth 2 (two) times daily as needed for mild pain or moderate pain (with food).     nitroGLYCERIN 0.4 MG SL tablet  Commonly known as:  NITROSTAT  Place 1 tablet (0.4 mg total) under the tongue every 5 (five) minutes as needed for chest pain.     pantoprazole 40 MG tablet  Commonly known as:  PROTONIX  TAKE 1 TABLET BY MOUTH DAILY     promethazine 25 MG tablet  Commonly known as:  PHENERGAN  Take 1 tablet (25 mg total) by mouth every 8 (eight) hours as needed for nausea or vomiting.     traMADol 50 MG tablet  Commonly known as:  ULTRAM  TAKE 2 TABLETS BY MOUTH TWICE DAILY AS NEEDED     TRUETEST TEST test strip  Generic drug:  glucose blood  USE TO TEST BLOOD SUGAR THREE TIMES A DAY     TRUETRACK BLOOD GLUCOSE w/Device Kit  by Does not apply route.         ROS: Negative, with the exception of above mentioned in HPI   Objective:  BP 127/82 mmHg  Pulse 98  Temp(Src) 98.2 F (36.8 C) (Oral)  Resp 20  Wt 229 lb 4 oz (103.987 kg)  SpO2 97% Body mass index is 39.33 kg/(m^2). Gen: Afebrile. No acute distress. Nontoxic in appearance, unable to bend knee/splinting.  HENT: AT. [place]. MMM,  Eyes:Pupils Equal Round  Reactive to light, Extraocular movements intact,  Conjunctiva without redness, discharge or icterus. CV: RRR , no edema Chest: CTAB, no wheeze or crackles. Good air movement, normal resp effort.  Ext:  no erythema, mild swelling and tenderness over left posterior calf. Pain with attempt at flexion.  Coolness to touch over area of concern  and lateral calf in comparison to right leg. Positive homan's left.  Skin: no rashes, purpura or petechiae. PT palpated, cap refill present.  Neuro:  Limp, keeping leg stiff. PERLA. EOMi. Alert. Oriented x3 Cranial nerves II through XII intact.  Psych: Normal affect, dress and demeanor. Normal speech. Normal thought content and judgment.  Assessment/Plan: Shina Wass is a 55 y.o. female present for acute OV for  Left leg pain - considering HPI/exam concern abdominal pathology causing either to be hypercoagulable/vascular congestion vs arterial blood clot vs venous clot.  - Pt with elevated HR above her normal, positive homans, temperature change over area, unable to bend left knee.  - Sent to ED for stat labs, imaging and anticoagulation.  - discussed concerns with pt, she is amendable to go to [facility] and if needed transferred to [facility].  - pt discussed with ED provider at [place].   > 25 minutes spent with patient, >50% of time spent face to face counseling patient and coordinating care.   electronically signed by:  Howard Pouch, DO  [place]

## 2016-02-09 NOTE — ED Provider Notes (Signed)
CSN: [id number]     Arrival date & time 02/09/16  1718 History   First MD Initiated Contact with Patient 02/09/16 1726     Chief Complaint  Patient presents with  . Leg Pain     (Consider location/radiation/quality/duration/timing/severity/associated sxs/prior Treatment) HPI LLE calf pain starting suddenly on Friday. No numbness or weakness. Pain with walking 7/10, better with rest,  Worse with bearing weight. No trauma. No hx of DVT  Past Medical History  Diagnosis Date  . Diabetes mellitus   . Hypertension   . Hyperlipemia   . Atypical chest pain     normal stress echo 02/2009 -LV size was normal LV global systolic function was normal- Normal wall motion; no LV regional wall motion abnormalities  . Asthma     childhood  . GERD (gastroesophageal reflux disease)   . Pain of right heel 10/14/2012  . Achilles tendonitis 10/14/2012  . Chest pain, atypical 08/24/2013  . Onychomycosis 04/17/2014  . Thoracic outlet syndrome 11/03/2014  . Benign paroxysmal positional vertigo 04/26/2015  . Hand pain, left 11/10/2015  . Preventative health care 11/22/2015  . Urinary urgency 12/15/2015  . Sinusitis, acute 01/17/2016   Past Surgical History  Procedure Laterality Date  . Abdominal hysterectomy    . Dilation and curettage of uterus      Lysis of Adhesions  . Robotic assisted salpingo oopherectomy  07/23/2012    Procedure: ROBOTIC ASSISTED SALPINGO OOPHERECTOMY;  Surgeon: Marvene Staff, MD;  Location: [facility];  Service: Gynecology;  Laterality: Bilateral;  . Robotic assisted laparoscopic lysis of adhesion  07/23/2012    Procedure: ROBOTIC ASSISTED LAPAROSCOPIC LYSIS OF ADHESION;  Surgeon: Marvene Staff, MD;  Location: [facility];  Service: Gynecology;  Laterality: N/A;  . Cystoscopy  07/23/2012    Procedure: CYSTOSCOPY;  Surgeon: Marvene Staff, MD;  Location: [facility];  Service: Gynecology;  Laterality: N/A;   Family History  Problem Relation Age of Onset  . GER disease Mother   .  GER disease Father   . Heart disease Father     father has "heart problems"   Social History  Substance Use Topics  . Smoking status: Former Research scientist (life sciences)  . Smokeless tobacco: Never Used  . Alcohol Use: No   OB History    No data available     Review of Systems  Constitutional: Negative for fever.  HENT: Negative for sore throat.   Eyes: Negative for visual disturbance.  Respiratory: Negative for cough and shortness of breath.   Cardiovascular: Negative for chest pain.  Gastrointestinal: Negative for abdominal pain.  Genitourinary: Negative for difficulty urinating.  Musculoskeletal: Positive for arthralgias. Negative for back pain and neck pain.  Skin: Negative for rash.  Neurological: Negative for syncope and headaches.      Allergies  Review of patient's allergies indicates no known allergies.  Home Medications   Prior to Admission medications   Medication Sig Start Date End Date Taking? Authorizing Provider  albuterol (PROVENTIL HFA;VENTOLIN HFA) 108 (90 Base) MCG/ACT inhaler Inhale 2 puffs into the lungs every 6 (six) hours as needed for wheezing or shortness of breath. 12/15/15   Mosie Lukes, MD  aspirin 81 MG tablet Take 81 mg by mouth daily.    Historical Provider, MD  atorvastatin (LIPITOR) 20 MG tablet Take 1 tablet (20 mg total) by mouth daily. 08/25/15   Mosie Lukes, MD  Blood Glucose Monitoring Suppl ([place] BLOOD GLUCOSE) W/DEVICE KIT by Does not apply route.  Historical Provider, MD  carvedilol (COREG) 3.125 MG tablet TAKE 1 TABLET BY MOUTH 2 TIMES DAILY WITH A MEAL 11/09/15   Mosie Lukes, MD  cyclobenzaprine (FLEXERIL) 10 MG tablet Take 1 tablet (10 mg total) by mouth 2 (two) times daily as needed for muscle spasms. 02/09/16   Gareth Morgan, MD  glimepiride (AMARYL) 4 MG tablet TAKE 1 TABLET BY MOUTH TWICE DAILY 12/28/15   Mosie Lukes, MD  hydrochlorothiazide (HYDRODIURIL) 25 MG tablet TAKE 1 TABLET BY MOUTH DAILY. 04/01/15   Mosie Lukes, MD   INVOKANA 100 MG TABS tablet TAKE 1 TABLET BY MOUTH DAILY. 01/20/15   Mosie Lukes, MD  INVOKANA 100 MG TABS tablet TAKE 1 TABLET BY MOUTH DAILY. 11/30/15   Mosie Lukes, MD  JANUVIA 100 MG tablet TAKE 1 TABLET BY MOUTH DAILY. 11/09/15   Mosie Lukes, MD  Lancet Device MISC by Does not apply route. Use to test blood sugar three times daily     Historical Provider, MD  meclizine (ANTIVERT) 25 MG tablet Take 1 tablet (25 mg total) by mouth 3 (three) times daily as needed for dizziness. 04/16/15   Mosie Lukes, MD  metFORMIN (GLUCOPHAGE-XR) 500 MG 24 hr tablet TAKE 2 TABLETS BY MOUTH TWICE DAILY AND 1 TABLET AT NOON 11/30/15   Mosie Lukes, MD  Multiple Vitamin (MULTIVITAMIN) tablet Take 1 tablet by mouth daily. Reported on 01/12/2016    Historical Provider, MD  naproxen (NAPROSYN) 500 MG tablet Take 1 tablet (500 mg total) by mouth 2 (two) times daily as needed for mild pain or moderate pain (with food). 11/10/15   Mosie Lukes, MD  nitroGLYCERIN (NITROSTAT) 0.4 MG SL tablet Place 1 tablet (0.4 mg total) under the tongue every 5 (five) minutes as needed for chest pain. 08/22/13   Mosie Lukes, MD  pantoprazole (PROTONIX) 40 MG tablet TAKE 1 TABLET BY MOUTH DAILY 12/28/15   Mosie Lukes, MD  promethazine (PHENERGAN) 25 MG tablet Take 1 tablet (25 mg total) by mouth every 8 (eight) hours as needed for nausea or vomiting. 01/05/16   Mosie Lukes, MD  traMADol (ULTRAM) 50 MG tablet TAKE 2 TABLETS BY MOUTH TWICE DAILY AS NEEDED 08/17/15   Mosie Lukes, MD  TRUETEST TEST test strip USE TO TEST BLOOD SUGAR THREE TIMES A DAY 05/05/15   Mosie Lukes, MD   BP 155/86 mmHg  Pulse 76  Temp(Src) 98.9 F (37.2 C) (Oral)  Resp 16  Ht _0  (1.626 m)  Wt 229 lb (103.874 kg)  BMI 39.29 kg/m2  SpO2 99% Physical Exam  Constitutional: She is oriented to person, place, and time. She appears well-developed and well-nourished. No distress.  HENT:  Head: Normocephalic and atraumatic.  Eyes: Conjunctivae  and EOM are normal.  Neck: Normal range of motion.  Cardiovascular: Normal rate, regular rhythm, normal heart sounds and intact distal pulses.  Exam reveals no gallop and no friction rub.   No murmur heard. Pulmonary/Chest: Effort normal and breath sounds normal. No respiratory distress. She has no wheezes. She has no rales.  Abdominal: Soft. She exhibits no distension. There is no tenderness. There is no guarding.  Musculoskeletal: She exhibits no edema.       Left lower leg: She exhibits tenderness (spasm of calf). She exhibits no bony tenderness, no swelling, no deformity and no laceration.  Neurological: She is alert and oriented to person, place, and time.  Skin: Skin is warm and  dry. No rash noted. She is not diaphoretic. No erythema.  Nursing note and vitals reviewed.   ED Course  Procedures (including critical care time) Labs Review Labs Reviewed - No data to display  Imaging Review US Venous Img Lower Unilateral Left  02/09/2016  CLINICAL DATA:  Acute onset of left lower extremity pain. Initial encounter. EXAM: LEFT LOWER EXTREMITY VENOUS DOPPLER ULTRASOUND TECHNIQUE: Gray-scale sonography with graded compression, as well as color Doppler and duplex ultrasound were performed to evaluate the lower extremity deep venous systems from the level of the common femoral vein and including the common femoral, femoral, profunda femoral, popliteal and calf veins including the posterior tibial, peroneal and gastrocnemius veins when visible. The superficial great saphenous vein was also interrogated. Spectral Doppler was utilized to evaluate flow at rest and with distal augmentation maneuvers in the common femoral, femoral and popliteal veins. COMPARISON:  None. FINDINGS: Contralateral Common Femoral Vein: Respiratory phasicity is normal and symmetric with the symptomatic side. No evidence of thrombus. Normal compressibility. Common Femoral Vein: No evidence of thrombus. Normal compressibility,  respiratory phasicity and response to augmentation. Saphenofemoral Junction: No evidence of thrombus. Normal compressibility and flow on color Doppler imaging. Profunda Femoral Vein: No evidence of thrombus. Normal compressibility and flow on color Doppler imaging. Femoral Vein: No evidence of thrombus. Normal compressibility, respiratory phasicity and response to augmentation. Popliteal Vein: No evidence of thrombus. Normal compressibility, respiratory phasicity and response to augmentation. Calf Veins: No evidence of thrombus. Normal compressibility and flow on color Doppler imaging. The peroneal vein is not well seen. Superficial Great Saphenous Vein: No evidence of thrombus. Normal compressibility and flow on color Doppler imaging. Venous Reflux:  None. Other Findings:  None. IMPRESSION: No evidence of deep venous thrombosis. Electronically Signed   By: Garald Balding M.D.   On: 02/09/2016 18:56   I have personally reviewed and evaluated these images and lab results as part of my medical decision-making.   EKG Interpretation None      MDM   Final diagnoses:  Lower extremity pain, left  Muscle strain   55 year old female with a history diabetes, hypertension, hyperlipidemia presents from pulmonology clinic for concern for left lower extremity pain. On my exam, patient has normal bilateral DP and PT pulses, equal temperature in both feet, normal cap refill, and have low suspicion for acute arterial thrombus. There is no sign of cellulitis or infection. No history to suggest trauma. DVT study was done which showed no evidence of DVT.  Suspect muscle strain. Gave rx for flexeril. Patient discharged in stable condition with understanding of reasons to return.   Gareth Morgan, MD 02/10/16 1122

## 2016-02-10 ENCOUNTER — Other Ambulatory Visit: Payer: 59

## 2016-02-10 MED FILL — CYCLOBENZAPRINE 10 MG TAB: 10 | 10 days supply | Qty: 20 | Fill #0

## 2016-02-16 ENCOUNTER — Encounter: Payer: Self-pay | Admitting: Family Medicine

## 2016-02-16 ENCOUNTER — Ambulatory Visit (INDEPENDENT_AMBULATORY_CARE_PROVIDER_SITE_OTHER): Payer: 59 | Admitting: Family Medicine

## 2016-02-16 VITALS — BP 120/76 | HR 82 | Temp 98.1°F | Ht 64.0 in | Wt 236.2 lb

## 2016-02-16 DIAGNOSIS — R252 Cramp and spasm: Secondary | ICD-10-CM

## 2016-02-16 DIAGNOSIS — E119 Type 2 diabetes mellitus without complications: Secondary | ICD-10-CM | POA: Diagnosis not present

## 2016-02-16 DIAGNOSIS — R079 Chest pain, unspecified: Secondary | ICD-10-CM | POA: Diagnosis not present

## 2016-02-16 DIAGNOSIS — I10 Essential (primary) hypertension: Secondary | ICD-10-CM | POA: Diagnosis not present

## 2016-02-16 DIAGNOSIS — E669 Obesity, unspecified: Secondary | ICD-10-CM

## 2016-02-16 DIAGNOSIS — E1169 Type 2 diabetes mellitus with other specified complication: Secondary | ICD-10-CM

## 2016-02-16 DIAGNOSIS — K219 Gastro-esophageal reflux disease without esophagitis: Secondary | ICD-10-CM | POA: Diagnosis not present

## 2016-02-16 DIAGNOSIS — H8110 Benign paroxysmal vertigo, unspecified ear: Secondary | ICD-10-CM

## 2016-02-16 DIAGNOSIS — R002 Palpitations: Secondary | ICD-10-CM

## 2016-02-16 MED ORDER — SITAGLIPTIN PHOSPHATE 100 MG PO TABS: 100.0000 mg | ORAL_TABLET | Freq: Every day | ORAL | Status: DC

## 2016-02-16 MED ORDER — TRUETEST TEST VI STRP: ORAL_STRIP | Status: DC

## 2016-02-16 MED ORDER — CARVEDILOL 3.125 MG PO TABS: ORAL_TABLET | ORAL | Status: DC

## 2016-02-16 MED ORDER — CANAGLIFLOZIN 100 MG PO TABS: 100.0000 mg | ORAL_TABLET | Freq: Every day | ORAL | Status: DC

## 2016-02-16 MED ORDER — MECLIZINE HCL 25 MG PO TABS: 25.0000 mg | ORAL_TABLET | Freq: Three times a day (TID) | ORAL | Status: DC | PRN

## 2016-02-16 MED ORDER — PROMETHAZINE HCL 25 MG PO TABS: 25.0000 mg | ORAL_TABLET | Freq: Three times a day (TID) | ORAL | Status: DC | PRN

## 2016-02-16 MED ORDER — NITROGLYCERIN 0.4 MG SL SUBL: 0.4000 mg | SUBLINGUAL_TABLET | SUBLINGUAL | Status: DC | PRN

## 2016-02-16 MED ORDER — METFORMIN HCL ER 500 MG PO TB24: ORAL_TABLET | ORAL | Status: DC

## 2016-02-16 MED ORDER — GLIMEPIRIDE 4 MG PO TABS: 4.0000 mg | ORAL_TABLET | Freq: Two times a day (BID) | ORAL | Status: DC

## 2016-02-16 MED ORDER — ATORVASTATIN CALCIUM 20 MG PO TABS: 20.0000 mg | ORAL_TABLET | Freq: Every day | ORAL | Status: DC

## 2016-02-16 MED ORDER — HYDROCHLOROTHIAZIDE 25 MG PO TABS: 25.0000 mg | ORAL_TABLET | Freq: Every day | ORAL | Status: DC

## 2016-02-16 MED FILL — INVOKANA 100 MG TABLET: 100 | 30 days supply | Qty: 30 | Fill #2

## 2016-02-16 MED FILL — GLIMEPIRIDE 4 MG TABLET: 4 | 30 days supply | Qty: 60 | Fill #1

## 2016-02-16 MED FILL — CARVEDILOL 3.125 MG TABLET: 3.125 | 90 days supply | Qty: 180 | Fill #0

## 2016-02-16 MED FILL — TRUE METRIX GLUCOSE TEST ST: 90 days supply | Qty: 300 | Fill #0

## 2016-02-16 MED FILL — NITROGLYCERIN 0.4 MG TAB SL: 0.4 | 7 days supply | Qty: 25 | Fill #0

## 2016-02-16 MED FILL — PROMETHAZINE 25 MG TABLET: 25 | 7 days supply | Qty: 20 | Fill #0

## 2016-02-16 MED FILL — METFORMIN HCL ER 500 MG TAB: 500 | 30 days supply | Qty: 150 | Fill #2

## 2016-02-16 MED FILL — JANUVIA 100 MG TABLET: 100 | 90 days supply | Qty: 90 | Fill #1

## 2016-02-16 NOTE — Progress Notes (Signed)
Pre visit review using our clinic review tool, if applicable. No additional management support is needed unless otherwise documented below in the visit note. 

## 2016-02-16 NOTE — Patient Instructions (Signed)

## 2016-02-17 ENCOUNTER — Other Ambulatory Visit: Payer: Self-pay | Admitting: Obstetrics and Gynecology

## 2016-02-17 DIAGNOSIS — N9489 Other specified conditions associated with female genital organs and menstrual cycle: Secondary | ICD-10-CM

## 2016-02-17 MED FILL — MECLIZINE 25 MG TABLET: 25 | 10 days supply | Qty: 30 | Fill #0

## 2016-02-21 NOTE — Progress Notes (Signed)
Patient ID: Eileen Richards, female   DOB: 04-01-1961, 55 y.o.   MRN: [id number]   Subjective:    Patient ID: Eileen Richards, female    DOB: 06-13-1961, 55 y.o.   MRN: [id number]  Chief Complaint  Patient presents with  . Follow-up    HPI Patient is in today for ER follow up. She presented to ER with left leg pain upon awakening 2 weeks ago. Some swelling was also noted. No falls or injury. She has also noted some dizziness and nausea. No vomiting. No flare in sugars, after eating suar was 140. Feels better today, pain is subsiding. Denies CP/palp/SOB/HA/congestion/fevers/GI or GU c/o. Taking meds as prescribed  Past Medical History  Diagnosis Date  . Diabetes mellitus   . Hypertension   . Hyperlipemia   . Atypical chest pain     normal stress echo 02/2009 -LV size was normal LV global systolic function was normal- Normal wall motion; no LV regional wall motion abnormalities  . Asthma     childhood  . GERD (gastroesophageal reflux disease)   . Pain of right heel 10/14/2012  . Achilles tendonitis 10/14/2012  . Chest pain, atypical 08/24/2013  . Onychomycosis 04/17/2014  . Thoracic outlet syndrome 11/03/2014  . Benign paroxysmal positional vertigo 04/26/2015  . Hand pain, left 11/10/2015  . Preventative health care 11/22/2015  . Urinary urgency 12/15/2015  . Sinusitis, acute 01/17/2016    Past Surgical History  Procedure Laterality Date  . Abdominal hysterectomy    . Dilation and curettage of uterus      Lysis of Adhesions  . Robotic assisted salpingo oopherectomy  07/23/2012    Procedure: ROBOTIC ASSISTED SALPINGO OOPHERECTOMY;  Surgeon: Marvene Staff, MD;  Location: [facility];  Service: Gynecology;  Laterality: Bilateral;  . Robotic assisted laparoscopic lysis of adhesion  07/23/2012    Procedure: ROBOTIC ASSISTED LAPAROSCOPIC LYSIS OF ADHESION;  Surgeon: Marvene Staff, MD;  Location: [facility];  Service: Gynecology;  Laterality: N/A;  . Cystoscopy  07/23/2012    Procedure:  CYSTOSCOPY;  Surgeon: Marvene Staff, MD;  Location: [facility];  Service: Gynecology;  Laterality: N/A;    Family History  Problem Relation Age of Onset  . GER disease Mother   . GER disease Father   . Heart disease Father     father has "heart problems"    Social History   Social History  . Marital Status: Single    Spouse Name: N/A  . Number of Children: N/A  . Years of Education: N/A   Occupational History  . Not on file.   Social History Main Topics  . Smoking status: Former Research scientist (life sciences)  . Smokeless tobacco: Never Used  . Alcohol Use: No  . Drug Use: No  . Sexual Activity: Not on file   Other Topics Concern  . Not on file   Social History Narrative    Outpatient Prescriptions Prior to Visit  Medication Sig Dispense Refill  . albuterol (PROVENTIL HFA;VENTOLIN HFA) 108 (90 Base) MCG/ACT inhaler Inhale 2 puffs into the lungs every 6 (six) hours as needed for wheezing or shortness of breath. 1 Inhaler 6  . aspirin 81 MG tablet Take 81 mg by mouth daily.    . Blood Glucose Monitoring Suppl (TRUETRACK BLOOD GLUCOSE) W/DEVICE KIT by Does not apply route.      . cyclobenzaprine (FLEXERIL) 10 MG tablet Take 1 tablet (10 mg total) by mouth 2 (two) times daily as needed for muscle spasms. 20 tablet 0  .  INVOKANA 100 MG TABS tablet TAKE 1 TABLET BY MOUTH DAILY. 30 tablet 2  . Lancet Device MISC by Does not apply route. Use to test blood sugar three times daily     . Multiple Vitamin (MULTIVITAMIN) tablet Take 1 tablet by mouth daily. Reported on 01/12/2016    . naproxen (NAPROSYN) 500 MG tablet Take 1 tablet (500 mg total) by mouth 2 (two) times daily as needed for mild pain or moderate pain (with food). 60 tablet 1  . pantoprazole (PROTONIX) 40 MG tablet TAKE 1 TABLET BY MOUTH DAILY 30 tablet 5  . traMADol (ULTRAM) 50 MG tablet TAKE 2 TABLETS BY MOUTH TWICE DAILY AS NEEDED 60 tablet PRN  . atorvastatin (LIPITOR) 20 MG tablet Take 1 tablet (20 mg total) by mouth daily. 90 tablet 3   . carvedilol (COREG) 3.125 MG tablet TAKE 1 TABLET BY MOUTH 2 TIMES DAILY WITH A MEAL 60 tablet 11  . glimepiride (AMARYL) 4 MG tablet TAKE 1 TABLET BY MOUTH TWICE DAILY 60 tablet 2  . hydrochlorothiazide (HYDRODIURIL) 25 MG tablet TAKE 1 TABLET BY MOUTH DAILY. 90 tablet 5  . INVOKANA 100 MG TABS tablet TAKE 1 TABLET BY MOUTH DAILY. 30 tablet 5  . JANUVIA 100 MG tablet TAKE 1 TABLET BY MOUTH DAILY. 30 tablet 11  . meclizine (ANTIVERT) 25 MG tablet Take 1 tablet (25 mg total) by mouth 3 (three) times daily as needed for dizziness. 30 tablet 0  . metFORMIN (GLUCOPHAGE-XR) 500 MG 24 hr tablet TAKE 2 TABLETS BY MOUTH TWICE DAILY AND 1 TABLET AT NOON 150 tablet 5  . nitroGLYCERIN (NITROSTAT) 0.4 MG SL tablet Place 1 tablet (0.4 mg total) under the tongue every 5 (five) minutes as needed for chest pain. 25 tablet 3  . promethazine (PHENERGAN) 25 MG tablet Take 1 tablet (25 mg total) by mouth every 8 (eight) hours as needed for nausea or vomiting. 20 tablet 0  . TRUETEST TEST test strip USE TO TEST BLOOD SUGAR THREE TIMES A DAY 100 each 11   No facility-administered medications prior to visit.    No Known Allergies  Review of Systems  Constitutional: Negative for fever and malaise/fatigue.  HENT: Negative for congestion.   Eyes: Negative for blurred vision.  Respiratory: Negative for shortness of breath.   Cardiovascular: Negative for chest pain, palpitations and leg swelling.  Gastrointestinal: Negative for nausea, abdominal pain and blood in stool.  Genitourinary: Negative for dysuria and frequency.  Musculoskeletal: Positive for myalgias and joint pain. Negative for falls.  Skin: Negative for rash.  Neurological: Positive for dizziness. Negative for loss of consciousness and headaches.  Endo/Heme/Allergies: Negative for environmental allergies.  Psychiatric/Behavioral: Negative for depression. The patient is not nervous/anxious.        Objective:    Physical Exam  Constitutional: She  is oriented to person, place, and time. She appears well-developed and well-nourished. No distress.  HENT:  Head: Normocephalic and atraumatic.  Nose: Nose normal.  Eyes: Right eye exhibits no discharge. Left eye exhibits no discharge.  Neck: Normal range of motion. Neck supple.  Cardiovascular: Normal rate and regular rhythm.   No murmur heard. Pulmonary/Chest: Effort normal and breath sounds normal.  Abdominal: Soft. Bowel sounds are normal. There is no tenderness.  Musculoskeletal: She exhibits no edema.  Neurological: She is alert and oriented to person, place, and time.  Skin: Skin is warm and dry.  Psychiatric: She has a normal mood and affect.  Nursing note and vitals reviewed.  BP 120/76 mmHg  Pulse 82  Temp(Src) 98.1 F (36.7 C) (Oral)  Ht '5\' 4"'$  (1.626 m)  Wt 236 lb 4 oz (107.162 kg)  BMI 40.53 kg/m2  SpO2 96% Wt Readings from Last 3 Encounters:  02/16/16 236 lb 4 oz (107.162 kg)  02/09/16 229 lb (103.874 kg)  02/09/16 229 lb 4 oz (103.987 kg)     Lab Results  Component Value Date   WBC 11.3* 02/09/2016   HGB 14.1 02/09/2016   HCT 42.8 02/09/2016   PLT 478.0* 02/09/2016   GLUCOSE 144* 02/09/2016   CHOL 150 02/09/2016   TRIG 70.0 02/09/2016   HDL 33.80* 02/09/2016   LDLCALC 102* 02/09/2016   ALT 14 02/09/2016   AST 17 02/09/2016   NA 140 02/09/2016   K 3.5 02/09/2016   CL 102 02/09/2016   CREATININE 0.65 02/09/2016   BUN 12 02/09/2016   CO2 32 02/09/2016   TSH 1.06 02/09/2016   HGBA1C 7.6* 02/09/2016   MICROALBUR 0.9 11/12/2015    Lab Results  Component Value Date   TSH 1.06 02/09/2016   Lab Results  Component Value Date   WBC 11.3* 02/09/2016   HGB 14.1 02/09/2016   HCT 42.8 02/09/2016   MCV 89.7 02/09/2016   PLT 478.0* 02/09/2016   Lab Results  Component Value Date   NA 140 02/09/2016   K 3.5 02/09/2016   CO2 32 02/09/2016   GLUCOSE 144* 02/09/2016   BUN 12 02/09/2016   CREATININE 0.65 02/09/2016   BILITOT 0.6 02/09/2016    ALKPHOS 62 02/09/2016   AST 17 02/09/2016   ALT 14 02/09/2016   PROT 7.0 02/09/2016   ALBUMIN 4.2 02/09/2016   CALCIUM 9.7 02/09/2016   GFR 122.49 02/09/2016   Lab Results  Component Value Date   CHOL 150 02/09/2016   Lab Results  Component Value Date   HDL 33.80* 02/09/2016   Lab Results  Component Value Date   LDLCALC 102* 02/09/2016   Lab Results  Component Value Date   TRIG 70.0 02/09/2016   Lab Results  Component Value Date   CHOLHDL 4 02/09/2016   Lab Results  Component Value Date   HGBA1C 7.6* 02/09/2016       Assessment & Plan:   Problem List Items Addressed This Visit    Palpitations    No recent episodes.      GERD (gastroesophageal reflux disease)    Avoid offending foods, start probiotics. Do not eat large meals in late evening and consider raising head of bed.       Relevant Medications   meclizine (ANTIVERT) 25 MG tablet   Essential hypertension    Well controlled, no changes to meds. Encouraged heart healthy diet such as the DASH diet and exercise as tolerated.       Relevant Medications   nitroGLYCERIN (NITROSTAT) 0.4 MG SL tablet   hydrochlorothiazide (HYDRODIURIL) 25 MG tablet   carvedilol (COREG) 3.125 MG tablet   atorvastatin (LIPITOR) 20 MG tablet   Diabetes mellitus type 2 in obese (HCC)    hgba1c acceptable, minimize simple carbs. Increase exercise as tolerated. Continue current meds      Relevant Medications   canagliflozin (INVOKANA) 100 MG TABS tablet   metFORMIN (GLUCOPHAGE-XR) 500 MG 24 hr tablet   sitaGLIPtin (JANUVIA) 100 MG tablet   glimepiride (AMARYL) 4 MG tablet   atorvastatin (LIPITOR) 20 MG tablet   CRAMP IN LIMB    Left leg pain improved at this time. No injury or fall  prior to onset of pain that took her to ER. Encouraged moist heat, gentle stretching and topical treatments.      Benign paroxysmal positional vertigo    Encouraged increased rest and increased hydration.        Other Visit Diagnoses     Chest pain, unspecified    -  Primary    Relevant Medications    nitroGLYCERIN (NITROSTAT) 0.4 MG SL tablet       I have changed Ms. Katzenberger's INVOKANA to canagliflozin and JANUVIA to sitaGLIPtin. I have also changed her hydrochlorothiazide and glimepiride. I am also having her maintain her Lancet Device, TRUETRACK BLOOD GLUCOSE, multivitamin, aspirin, traMADol, naproxen, INVOKANA, albuterol, pantoprazole, cyclobenzaprine, promethazine, nitroGLYCERIN, metFORMIN, carvedilol, atorvastatin, TRUETEST TEST, and meclizine.  Meds ordered this encounter  Medications  . promethazine (PHENERGAN) 25 MG tablet    Sig: Take 1 tablet (25 mg total) by mouth every 8 (eight) hours as needed for nausea or vomiting.    Dispense:  20 tablet    Refill:  0  . nitroGLYCERIN (NITROSTAT) 0.4 MG SL tablet    Sig: Place 1 tablet (0.4 mg total) under the tongue every 5 (five) minutes as needed for chest pain.    Dispense:  25 tablet    Refill:  3  . canagliflozin (INVOKANA) 100 MG TABS tablet    Sig: Take 1 tablet (100 mg total) by mouth daily.    Dispense:  30 tablet    Refill:  5  . metFORMIN (GLUCOPHAGE-XR) 500 MG 24 hr tablet    Sig: TAKE 2 TABLETS BY MOUTH TWICE DAILY AND 1 TABLET AT NOON    Dispense:  150 tablet    Refill:  5  . sitaGLIPtin (JANUVIA) 100 MG tablet    Sig: Take 1 tablet (100 mg total) by mouth daily.    Dispense:  30 tablet    Refill:  6  . hydrochlorothiazide (HYDRODIURIL) 25 MG tablet    Sig: Take 1 tablet (25 mg total) by mouth daily.    Dispense:  90 tablet    Refill:  5  . glimepiride (AMARYL) 4 MG tablet    Sig: Take 1 tablet (4 mg total) by mouth 2 (two) times daily.    Dispense:  60 tablet    Refill:  6  . carvedilol (COREG) 3.125 MG tablet    Sig: TAKE 1 TABLET BY MOUTH 2 TIMES DAILY WITH A MEAL    Dispense:  60 tablet    Refill:  6  . atorvastatin (LIPITOR) 20 MG tablet    Sig: Take 1 tablet (20 mg total) by mouth daily.    Dispense:  90 tablet    Refill:  3  .  TRUETEST TEST test strip    Sig: USE TO TEST BLOOD SUGAR THREE TIMES A DAY    Dispense:  100 each    Refill:  11  . meclizine (ANTIVERT) 25 MG tablet    Sig: Take 1 tablet (25 mg total) by mouth 3 (three) times daily as needed for dizziness.    Dispense:  30 tablet    Refill:  0     Penni Homans, MD

## 2016-02-21 NOTE — Assessment & Plan Note (Signed)
hgba1c acceptable, minimize simple carbs. Increase exercise as tolerated. Continue current meds 

## 2016-02-21 NOTE — Assessment & Plan Note (Signed)
Encouraged increased rest and increased hydration.

## 2016-02-21 NOTE — Assessment & Plan Note (Signed)
No recent episodes

## 2016-02-21 NOTE — Assessment & Plan Note (Signed)
Left leg pain improved at this time. No injury or fall prior to onset of pain that took her to ER. Encouraged moist heat, gentle stretching and topical treatments.

## 2016-02-21 NOTE — Assessment & Plan Note (Signed)
Avoid offending foods, start probiotics. Do not eat large meals in late evening and consider raising head of bed.  

## 2016-02-21 NOTE — Assessment & Plan Note (Signed)
Well controlled, no changes to meds. Encouraged heart healthy diet such as the DASH diet and exercise as tolerated.  °

## 2016-02-22 ENCOUNTER — Ambulatory Visit
Admission: RE | Admit: 2016-02-22 | Discharge: 2016-02-22 | Disposition: A | Discharge status: Home / Self Care | Payer: 59 | Source: Ambulatory Visit | Attending: Obstetrics and Gynecology | Admitting: Obstetrics and Gynecology

## 2016-02-22 DIAGNOSIS — N9489 Other specified conditions associated with female genital organs and menstrual cycle: Secondary | ICD-10-CM

## 2016-02-22 DIAGNOSIS — N838 Other noninflammatory disorders of ovary, fallopian tube and broad ligament: Secondary | ICD-10-CM | POA: Diagnosis not present

## 2016-02-22 MED ORDER — IOPAMIDOL (ISOVUE-300) INJECTION 61%
100.0000 mL | Freq: Once | INTRAVENOUS | Status: AC | PRN
  Administered 2016-02-22: 100 mL via INTRAVENOUS

## 2016-03-08 ENCOUNTER — Ambulatory Visit: Payer: Self-pay | Admitting: Family Medicine

## 2016-03-08 DIAGNOSIS — Z1231 Encounter for screening mammogram for malignant neoplasm of breast: Secondary | ICD-10-CM | POA: Diagnosis not present

## 2016-03-08 LAB — HM MAMMOGRAPHY

## 2016-03-10 ENCOUNTER — Encounter: Payer: Self-pay | Admitting: Family Medicine

## 2016-03-28 MED FILL — PANTOPRAZOLE SOD DR 40 MG T: 40 | 30 days supply | Qty: 30 | Fill #1

## 2016-03-28 MED FILL — METFORMIN HCL ER 500 MG TAB: 500 | 30 days supply | Qty: 150 | Fill #3

## 2016-03-28 MED FILL — GLIMEPIRIDE 4 MG TABLET: 4 | 30 days supply | Qty: 60 | Fill #2

## 2016-03-28 MED FILL — HYDROCHLOROTHIAZIDE 25 MG T: 25 | 90 days supply | Qty: 90 | Fill #3

## 2016-03-28 MED FILL — INVOKANA 100 MG TABLET: 100 | 30 days supply | Qty: 30 | Fill #0

## 2016-04-13 ENCOUNTER — Other Ambulatory Visit: Payer: Self-pay | Admitting: *Deleted

## 2016-04-13 NOTE — Patient Outreach (Signed)
Returned Paticia's phone call regarding scheduling routine [facility] follow up. She saw her primary care MD in May and will see her again on 8/24. Her Hgb A1C had improved to 7.6% when it was checked in May. Left message on Yarielys's phone with request for her to call back to schedule [facility] appointment in late September. Barrington Ellison RN,CCM,CDE [facility] Management Coordinator [facility] Office Phone [phone] Office Fax [phone]

## 2016-05-12 ENCOUNTER — Encounter: Payer: Self-pay | Admitting: Family Medicine

## 2016-05-12 ENCOUNTER — Ambulatory Visit (INDEPENDENT_AMBULATORY_CARE_PROVIDER_SITE_OTHER): Payer: 59 | Admitting: Family Medicine

## 2016-05-12 VITALS — BP 120/68 | HR 88 | Temp 98.3°F | Ht 64.0 in | Wt 238.1 lb

## 2016-05-12 DIAGNOSIS — I10 Essential (primary) hypertension: Secondary | ICD-10-CM

## 2016-05-12 DIAGNOSIS — E119 Type 2 diabetes mellitus without complications: Secondary | ICD-10-CM | POA: Diagnosis not present

## 2016-05-12 DIAGNOSIS — E669 Obesity, unspecified: Secondary | ICD-10-CM | POA: Diagnosis not present

## 2016-05-12 DIAGNOSIS — R002 Palpitations: Secondary | ICD-10-CM | POA: Diagnosis not present

## 2016-05-12 DIAGNOSIS — R0789 Other chest pain: Secondary | ICD-10-CM | POA: Diagnosis not present

## 2016-05-12 DIAGNOSIS — E782 Mixed hyperlipidemia: Secondary | ICD-10-CM

## 2016-05-12 DIAGNOSIS — E1169 Type 2 diabetes mellitus with other specified complication: Secondary | ICD-10-CM

## 2016-05-12 DIAGNOSIS — Z23 Encounter for immunization: Secondary | ICD-10-CM

## 2016-05-12 MED FILL — PANTOPRAZOLE SOD DR 40 MG T: 40 | 30 days supply | Qty: 30 | Fill #2

## 2016-05-12 MED FILL — JANUVIA 100 MG TABLET: 100 | 90 days supply | Qty: 90 | Fill #2

## 2016-05-12 MED FILL — GLIMEPIRIDE 4 MG TABLET: 4 | 30 days supply | Qty: 60 | Fill #0

## 2016-05-12 MED FILL — METFORMIN HCL ER 500 MG TAB: 500 | 30 days supply | Qty: 150 | Fill #4

## 2016-05-12 MED FILL — INVOKANA 100 MG TABLET: 100 | 30 days supply | Qty: 30 | Fill #1

## 2016-05-12 NOTE — Progress Notes (Signed)
Pre visit review using our clinic review tool, if applicable. No additional management support is needed unless otherwise documented below in the visit note. 

## 2016-05-12 NOTE — Patient Instructions (Signed)
Hypertension Hypertension, commonly called high blood pressure, is when the force of blood pumping through your arteries is too strong. Your arteries are the blood vessels that carry blood from your heart throughout your body. A blood pressure reading consists of a higher number over a lower number, such as 110/72. The higher number (systolic) is the pressure inside your arteries when your heart pumps. The lower number (diastolic) is the pressure inside your arteries when your heart relaxes. Ideally you want your blood pressure below 120/80. Hypertension forces your heart to work harder to pump blood. Your arteries may become narrow or stiff. Having untreated or uncontrolled hypertension can cause heart attack, stroke, kidney disease, and other problems. RISK FACTORS Some risk factors for high blood pressure are controllable. Others are not.  Risk factors you cannot control include:   Race. You may be at higher risk if you are African American.  Age. Risk increases with age.  Gender. Men are at higher risk than women before age 45 years. After age 65, women are at higher risk than men. Risk factors you can control include:  Not getting enough exercise or physical activity.  Being overweight.  Getting too much fat, sugar, calories, or salt in your diet.  Drinking too much alcohol. SIGNS AND SYMPTOMS Hypertension does not usually cause signs or symptoms. Extremely high blood pressure (hypertensive crisis) may cause headache, anxiety, shortness of breath, and nosebleed. DIAGNOSIS To check if you have hypertension, your health care provider will measure your blood pressure while you are seated, with your arm held at the level of your heart. It should be measured at least twice using the same arm. Certain conditions can cause a difference in blood pressure between your right and left arms. A blood pressure reading that is higher than normal on one occasion does not mean that you need treatment. If  it is not clear whether you have high blood pressure, you may be asked to return on a different day to have your blood pressure checked again. Or, you may be asked to monitor your blood pressure at home for 1 or more weeks. TREATMENT Treating high blood pressure includes making lifestyle changes and possibly taking medicine. Living a healthy lifestyle can help lower high blood pressure. You may need to change some of your habits. Lifestyle changes may include:  Following the DASH diet. This diet is high in fruits, vegetables, and whole grains. It is low in salt, red meat, and added sugars.  Keep your sodium intake below 2,300 mg per day.  Getting at least 30-45 minutes of aerobic exercise at least 4 times per week.  Losing weight if necessary.  Not smoking.  Limiting alcoholic beverages.  Learning ways to reduce stress. Your health care provider may prescribe medicine if lifestyle changes are not enough to get your blood pressure under control, and if one of the following is true:  You are 18-59 years of age and your systolic blood pressure is above 140.  You are 60 years of age or older, and your systolic blood pressure is above 150.  Your diastolic blood pressure is above 90.  You have diabetes, and your systolic blood pressure is over 140 or your diastolic blood pressure is over 90.  You have kidney disease and your blood pressure is above 140/90.  You have heart disease and your blood pressure is above 140/90. Your personal target blood pressure may vary depending on your medical conditions, your age, and other factors. HOME CARE INSTRUCTIONS    Have your blood pressure rechecked as directed by your health care provider.   Take medicines only as directed by your health care provider. Follow the directions carefully. Blood pressure medicines must be taken as prescribed. The medicine does not work as well when you skip doses. Skipping doses also puts you at risk for  problems.  Do not smoke.   Monitor your blood pressure at home as directed by your health care provider. SEEK MEDICAL CARE IF:   You think you are having a reaction to medicines taken.  You have recurrent headaches or feel dizzy.  You have swelling in your ankles.  You have trouble with your vision. SEEK IMMEDIATE MEDICAL CARE IF:  You develop a severe headache or confusion.  You have unusual weakness, numbness, or feel faint.  You have severe chest or abdominal pain.  You vomit repeatedly.  You have trouble breathing. MAKE SURE YOU:   Understand these instructions.  Will watch your condition.  Will get help right away if you are not doing well or get worse.   This information is not intended to replace advice given to you by your health care provider. Make sure you discuss any questions you have with your health care provider.   Document Released: 09/05/2005 Document Revised: 01/20/2015 Document Reviewed: 06/28/2013 Elsevier Interactive Patient Education 2016 Elsevier Inc.  

## 2016-05-13 DIAGNOSIS — Z23 Encounter for immunization: Secondary | ICD-10-CM | POA: Diagnosis not present

## 2016-05-13 DIAGNOSIS — E119 Type 2 diabetes mellitus without complications: Secondary | ICD-10-CM | POA: Diagnosis not present

## 2016-05-13 DIAGNOSIS — E782 Mixed hyperlipidemia: Secondary | ICD-10-CM | POA: Diagnosis not present

## 2016-05-13 DIAGNOSIS — R0789 Other chest pain: Secondary | ICD-10-CM | POA: Diagnosis not present

## 2016-05-13 DIAGNOSIS — R002 Palpitations: Secondary | ICD-10-CM | POA: Diagnosis not present

## 2016-05-13 LAB — CBC
HCT: 44.5 % (ref 36.0–46.0)
Hemoglobin: 15.1 g/dL — ABNORMAL HIGH (ref 12.0–15.0)
MCHC: 34 g/dL (ref 30.0–36.0)
MCV: 89 fl (ref 78.0–100.0)
Platelets: 379 10*3/uL (ref 150.0–400.0)
RBC: 5 Mil/uL (ref 3.87–5.11)
RDW: 14.4 % (ref 11.5–15.5)
WBC: 12.8 10*3/uL — ABNORMAL HIGH (ref 4.0–10.5)

## 2016-05-13 LAB — HEMOGLOBIN A1C: Hgb A1c MFr Bld: 8.6 % — ABNORMAL HIGH (ref 4.6–6.5)

## 2016-05-13 LAB — COMPREHENSIVE METABOLIC PANEL
ALT: 14 U/L (ref 0–35)
AST: 17 U/L (ref 0–37)
Albumin: 4.2 g/dL (ref 3.5–5.2)
Alkaline Phosphatase: 92 U/L (ref 39–117)
BUN: 13 mg/dL (ref 6–23)
CO2: 31 mEq/L (ref 19–32)
Calcium: 9.8 mg/dL (ref 8.4–10.5)
Chloride: 100 mEq/L (ref 96–112)
Creatinine, Ser: 1.08 mg/dL (ref 0.40–1.20)
GFR: 68.11 mL/min (ref >60.00)
Glucose, Bld: 111 mg/dL — ABNORMAL HIGH (ref 70–99)
Potassium: 4 mEq/L (ref 3.5–5.1)
Sodium: 140 mEq/L (ref 135–145)
Total Bilirubin: 0.5 mg/dL (ref 0.2–1.2)
Total Protein: 7.7 g/dL (ref 6.0–8.3)

## 2016-05-13 LAB — TSH: TSH: 1.77 u[IU]/mL (ref 0.35–4.50)

## 2016-05-13 LAB — LDL CHOLESTEROL, DIRECT: Direct LDL: 123 mg/dL

## 2016-05-13 LAB — LIPID PANEL
Cholesterol: 193 mg/dL (ref 0–200)
HDL: 40.5 mg/dL (ref >39.00)
NonHDL: 152.26
Total CHOL/HDL Ratio: 5
Triglycerides: 201 mg/dL — ABNORMAL HIGH (ref 0.0–149.0)
VLDL: 40.2 mg/dL — ABNORMAL HIGH (ref 0.0–40.0)

## 2016-05-16 ENCOUNTER — Encounter: Payer: Self-pay | Admitting: Family Medicine

## 2016-05-16 ENCOUNTER — Other Ambulatory Visit: Payer: Self-pay | Admitting: Family Medicine

## 2016-05-16 DIAGNOSIS — Z6841 Body Mass Index (BMI) 40.0 and over, adult: Secondary | ICD-10-CM | POA: Diagnosis not present

## 2016-05-16 DIAGNOSIS — Z01419 Encounter for gynecological examination (general) (routine) without abnormal findings: Secondary | ICD-10-CM | POA: Diagnosis not present

## 2016-05-16 MED ORDER — DULAGLUTIDE 0.75 MG/0.5ML ~~LOC~~ SOAJ: SUBCUTANEOUS | 1 refills | Status: DC

## 2016-05-17 ENCOUNTER — Telehealth: Payer: Self-pay

## 2016-05-17 ENCOUNTER — Other Ambulatory Visit: Payer: Self-pay | Admitting: Family Medicine

## 2016-05-17 MED ORDER — LIRAGLUTIDE 18 MG/3ML ~~LOC~~ SOPN: PEN_INJECTOR | SUBCUTANEOUS | 2 refills | Status: DC

## 2016-05-17 MED FILL — UNIFINE PENTIPS 31GX3/16: 31G X 5 MM | 90 days supply | Qty: 100 | Fill #0

## 2016-05-17 MED FILL — VICTOZA 2-PAK 18 MG/3 ML PE: 18 | 30 days supply | Qty: 6 | Fill #0

## 2016-05-17 NOTE — Telephone Encounter (Signed)
FMLA paperwork received.  Filled in as much as possible and placed in provider's blue folder for review and completion.

## 2016-05-18 ENCOUNTER — Other Ambulatory Visit: Payer: Self-pay | Admitting: *Deleted

## 2016-05-18 NOTE — Patient Outreach (Signed)
Left message on Angel's cell phone requesting she return call to schedule [facility] follow up in September. She saw Dr. Randel Pigg on 8/24 and her Hgb A1C had increased from 7.6% on 5/23 to 8.6% on 8/24 and Victoza was added to her DM medication regimen.  Barrington Ellison RN,CCM,CDE [facility] Management Coordinator [facility] Office Phone [phone] Office Fax [phone]

## 2016-05-29 NOTE — Assessment & Plan Note (Signed)
Encouraged DASH diet, decrease po intake and increase exercise as tolerated. Needs 7-8 hours of sleep nightly. Avoid trans fats, eat small, frequent meals every 4-5 hours with lean proteins, complex carbs and healthy fats. Minimize simple carbs, GMO foods. 

## 2016-05-29 NOTE — Assessment & Plan Note (Signed)
Likely musculoskeletal, affected by position changes, no associated symptoms. Encouraged moist heat and gentle stretching as tolerated. May try NSAIDs and prescription meds as directed and report if symptoms worsen or seek immediate care. Try topical patches

## 2016-05-29 NOTE — Assessment & Plan Note (Addendum)
hgba1c unacceptable, minimize simple carbs. Increase exercise as tolerated. Continue current meds but add Victoza due to increased numbers.

## 2016-05-29 NOTE — Assessment & Plan Note (Signed)
Well controlled, no changes to meds. Encouraged heart healthy diet such as the DASH diet and exercise as tolerated.  °

## 2016-05-29 NOTE — Progress Notes (Signed)
Patient ID: Eileen Richards, female   DOB: 11/15/1960, 55 y.o.   MRN: [id number]   Subjective:    Patient ID: Eileen Richards, female    DOB: 12-23-1960, 55 y.o.   MRN: [id number]  Chief Complaint  Patient presents with  . Follow-up    HPI Patient is in today for follow up. No recent illness or hospitalization. She is noting fatigue and some urinary frequency. No dysuria. Is noting some left chest wall pain which is affected by movement. She denies any falls or trauma no associated symptoms. Denies palp/SOB/HA/congestion/fevers/GI or GU c/o. Taking meds as prescribed  Past Medical History:  Diagnosis Date  . Achilles tendonitis 10/14/2012  . Asthma    childhood  . Atypical chest pain    normal stress echo 02/2009 -LV size was normal LV global systolic function was normal- Normal wall motion; no LV regional wall motion abnormalities  . Benign paroxysmal positional vertigo 04/26/2015  . Chest pain, atypical 08/24/2013  . Diabetes mellitus   . GERD (gastroesophageal reflux disease)   . Hand pain, left 11/10/2015  . Hyperlipemia   . Hypertension   . Onychomycosis 04/17/2014  . Pain of right heel 10/14/2012  . Preventative health care 11/22/2015  . Sinusitis, acute 01/17/2016  . Thoracic outlet syndrome 11/03/2014  . Urinary urgency 12/15/2015    Past Surgical History:  Procedure Laterality Date  . ABDOMINAL HYSTERECTOMY    . CYSTOSCOPY  07/23/2012   Procedure: CYSTOSCOPY;  Surgeon: Marvene Staff, MD;  Location: [facility];  Service: Gynecology;  Laterality: N/A;  . DILATION AND CURETTAGE OF UTERUS     Lysis of Adhesions  . ROBOTIC ASSISTED LAPAROSCOPIC LYSIS OF ADHESION  07/23/2012   Procedure: ROBOTIC ASSISTED LAPAROSCOPIC LYSIS OF ADHESION;  Surgeon: Marvene Staff, MD;  Location: [facility];  Service: Gynecology;  Laterality: N/A;  . ROBOTIC ASSISTED SALPINGO OOPHERECTOMY  07/23/2012   Procedure: ROBOTIC ASSISTED SALPINGO OOPHERECTOMY;  Surgeon: Marvene Staff, MD;  Location:  [facility];  Service: Gynecology;  Laterality: Bilateral;    Family History  Problem Relation Age of Onset  . GER disease Mother   . GER disease Father   . Heart disease Father     father has "heart problems"    Social History   Social History  . Marital status: Single    Spouse name: N/A  . Number of children: N/A  . Years of education: N/A   Occupational History  . Not on file.   Social History Main Topics  . Smoking status: Former Research scientist (life sciences)  . Smokeless tobacco: Never Used  . Alcohol use No  . Drug use: No  . Sexual activity: Not on file   Other Topics Concern  . Not on file   Social History Narrative  . No narrative on file    Outpatient Medications Prior to Visit  Medication Sig Dispense Refill  . albuterol (PROVENTIL HFA;VENTOLIN HFA) 108 (90 Base) MCG/ACT inhaler Inhale 2 puffs into the lungs every 6 (six) hours as needed for wheezing or shortness of breath. 1 Inhaler 6  . aspirin 81 MG tablet Take 81 mg by mouth daily.    Marland Kitchen atorvastatin (LIPITOR) 20 MG tablet Take 1 tablet (20 mg total) by mouth daily. 90 tablet 3  . Blood Glucose Monitoring Suppl (TRUETRACK BLOOD GLUCOSE) W/DEVICE KIT by Does not apply route.      . canagliflozin (INVOKANA) 100 MG TABS tablet Take 1 tablet (100 mg total) by mouth daily. 30 tablet 5  .  carvedilol (COREG) 3.125 MG tablet TAKE 1 TABLET BY MOUTH 2 TIMES DAILY WITH A MEAL 60 tablet 6  . cyclobenzaprine (FLEXERIL) 10 MG tablet Take 1 tablet (10 mg total) by mouth 2 (two) times daily as needed for muscle spasms. 20 tablet 0  . glimepiride (AMARYL) 4 MG tablet Take 1 tablet (4 mg total) by mouth 2 (two) times daily. 60 tablet 6  . hydrochlorothiazide (HYDRODIURIL) 25 MG tablet Take 1 tablet (25 mg total) by mouth daily. 90 tablet 5  . Lancet Device MISC by Does not apply route. Use to test blood sugar three times daily     . meclizine (ANTIVERT) 25 MG tablet Take 1 tablet (25 mg total) by mouth 3 (three) times daily as needed for dizziness.  30 tablet 0  . metFORMIN (GLUCOPHAGE-XR) 500 MG 24 hr tablet TAKE 2 TABLETS BY MOUTH TWICE DAILY AND 1 TABLET AT NOON 150 tablet 5  . Multiple Vitamin (MULTIVITAMIN) tablet Take 1 tablet by mouth daily. Reported on 01/12/2016    . naproxen (NAPROSYN) 500 MG tablet Take 1 tablet (500 mg total) by mouth 2 (two) times daily as needed for mild pain or moderate pain (with food). 60 tablet 1  . nitroGLYCERIN (NITROSTAT) 0.4 MG SL tablet Place 1 tablet (0.4 mg total) under the tongue every 5 (five) minutes as needed for chest pain. 25 tablet 3  . pantoprazole (PROTONIX) 40 MG tablet TAKE 1 TABLET BY MOUTH DAILY 30 tablet 5  . promethazine (PHENERGAN) 25 MG tablet Take 1 tablet (25 mg total) by mouth every 8 (eight) hours as needed for nausea or vomiting. 20 tablet 0  . sitaGLIPtin (JANUVIA) 100 MG tablet Take 1 tablet (100 mg total) by mouth daily. 30 tablet 6  . traMADol (ULTRAM) 50 MG tablet TAKE 2 TABLETS BY MOUTH TWICE DAILY AS NEEDED 60 tablet PRN  . TRUETEST TEST test strip USE TO TEST BLOOD SUGAR THREE TIMES A DAY 100 each 11  . INVOKANA 100 MG TABS tablet TAKE 1 TABLET BY MOUTH DAILY. 30 tablet 2   No facility-administered medications prior to visit.     No Known Allergies  Review of Systems  Constitutional: Positive for malaise/fatigue. Negative for fever.  HENT: Negative for congestion.   Eyes: Negative for blurred vision.  Respiratory: Negative for shortness of breath.   Cardiovascular: Positive for chest pain. Negative for palpitations and leg swelling.  Gastrointestinal: Negative for abdominal pain, blood in stool and nausea.  Genitourinary: Negative for dysuria and frequency.  Musculoskeletal: Negative for falls.  Skin: Negative for rash.  Neurological: Negative for dizziness, loss of consciousness and headaches.  Endo/Heme/Allergies: Negative for environmental allergies.  Psychiatric/Behavioral: Negative for depression. The patient is not nervous/anxious.        Objective:      Physical Exam  Constitutional: She is oriented to person, place, and time. She appears well-developed and well-nourished. No distress.  HENT:  Head: Normocephalic and atraumatic.  Nose: Nose normal.  Eyes: Right eye exhibits no discharge. Left eye exhibits no discharge.  Neck: Normal range of motion. Neck supple.  Cardiovascular: Normal rate and regular rhythm.   No murmur heard. Pulmonary/Chest: Effort normal and breath sounds normal.  Abdominal: Soft. Bowel sounds are normal. There is no tenderness.  Musculoskeletal: She exhibits no edema.  Neurological: She is alert and oriented to person, place, and time.  Skin: Skin is warm and dry.  Psychiatric: She has a normal mood and affect.  Nursing note and vitals reviewed.  BP 120/68 (BP Location: Left Arm, Patient Position: Sitting, Cuff Size: Large)   Pulse 88   Temp 98.3 F (36.8 C) (Oral)   Ht '5\' 4"'$  (1.626 m)   Wt 238 lb 2 oz (108 kg)   SpO2 98%   BMI 40.87 kg/m  Wt Readings from Last 3 Encounters:  05/12/16 238 lb 2 oz (108 kg)  02/16/16 236 lb 4 oz (107.2 kg)  02/09/16 229 lb (103.9 kg)     Lab Results  Component Value Date   WBC 12.8 (H) 05/12/2016   HGB 15.1 (H) 05/12/2016   HCT 44.5 05/12/2016   PLT 379.0 05/12/2016   GLUCOSE 111 (H) 05/12/2016   CHOL 193 05/12/2016   TRIG 201.0 (H) 05/12/2016   HDL 40.50 05/12/2016   LDLDIRECT 123.0 05/12/2016   LDLCALC 102 (H) 02/09/2016   ALT 14 05/12/2016   AST 17 05/12/2016   NA 140 05/12/2016   K 4.0 05/12/2016   CL 100 05/12/2016   CREATININE 1.08 05/12/2016   BUN 13 05/12/2016   CO2 31 05/12/2016   TSH 1.77 05/12/2016   HGBA1C 8.6 (H) 05/12/2016   MICROALBUR 0.9 11/12/2015    Lab Results  Component Value Date   TSH 1.77 05/12/2016   Lab Results  Component Value Date   WBC 12.8 (H) 05/12/2016   HGB 15.1 (H) 05/12/2016   HCT 44.5 05/12/2016   MCV 89.0 05/12/2016   PLT 379.0 05/12/2016   Lab Results  Component Value Date   NA 140 05/12/2016   K  4.0 05/12/2016   CO2 31 05/12/2016   GLUCOSE 111 (H) 05/12/2016   BUN 13 05/12/2016   CREATININE 1.08 05/12/2016   BILITOT 0.5 05/12/2016   ALKPHOS 92 05/12/2016   AST 17 05/12/2016   ALT 14 05/12/2016   PROT 7.7 05/12/2016   ALBUMIN 4.2 05/12/2016   CALCIUM 9.8 05/12/2016   GFR 68.11 05/12/2016   Lab Results  Component Value Date   CHOL 193 05/12/2016   Lab Results  Component Value Date   HDL 40.50 05/12/2016   Lab Results  Component Value Date   LDLCALC 102 (H) 02/09/2016   Lab Results  Component Value Date   TRIG 201.0 (H) 05/12/2016   Lab Results  Component Value Date   CHOLHDL 5 05/12/2016   Lab Results  Component Value Date   HGBA1C 8.6 (H) 05/12/2016       Assessment & Plan:   Problem List Items Addressed This Visit    Diabetes mellitus type 2 in obese ([place])    hgba1c unacceptable, minimize simple carbs. Increase exercise as tolerated. Continue current meds but add Victoza due to increased numbers.      Relevant Orders   Hemoglobin A1c (Completed)   Hyperlipidemia, mixed    Tolerating statin, encouraged heart healthy diet, avoid trans fats, minimize simple carbs and saturated fats. Increase exercise as tolerated      Relevant Orders   Lipid panel (Completed)   Obesity    Encouraged DASH diet, decrease po intake and increase exercise as tolerated. Needs 7-8 hours of sleep nightly. Avoid trans fats, eat small, frequent meals every 4-5 hours with lean proteins, complex carbs and healthy fats. Minimize simple carbs, GMO foods.      Essential hypertension    Well controlled, no changes to meds. Encouraged heart healthy diet such as the DASH diet and exercise as tolerated.       Relevant Orders   TSH (Completed)   CBC (Completed)  Comprehensive metabolic panel (Completed)   Chest pain, atypical    Likely musculoskeletal, affected by position changes, no associated symptoms. Encouraged moist heat and gentle stretching as tolerated. May try NSAIDs  and prescription meds as directed and report if symptoms worsen or seek immediate care. Try topical patches      Palpitations - Primary   Relevant Orders   TSH (Completed)   CBC (Completed)    Other Visit Diagnoses    Need for vaccination with 13-polyvalent pneumococcal conjugate vaccine       Relevant Orders   Pneumococcal conjugate vaccine 13-valent (Completed)      I have discontinued Ms. Milling's INVOKANA. I am also having her maintain her Lancet Device, TRUETRACK BLOOD GLUCOSE, multivitamin, aspirin, traMADol, naproxen, albuterol, pantoprazole, cyclobenzaprine, promethazine, nitroGLYCERIN, canagliflozin, metFORMIN, sitaGLIPtin, hydrochlorothiazide, glimepiride, carvedilol, atorvastatin, TRUETEST TEST, and meclizine.  No orders of the defined types were placed in this encounter.    Penni Homans, MD

## 2016-05-29 NOTE — Assessment & Plan Note (Signed)
Tolerating statin, encouraged heart healthy diet, avoid trans fats, minimize simple carbs and saturated fats. Increase exercise as tolerated 

## 2016-06-03 NOTE — Telephone Encounter (Signed)
Patient checking on the status of FMLA paperwork, patient states forms were filled out incorrectly and forms were faxed back to PCP office. Patient doesent know when forms were fax over. Patient was notified by mail rom insurance. Please advise best # [phone]

## 2016-06-03 NOTE — Telephone Encounter (Signed)
Called and left a message for call back  

## 2016-06-06 NOTE — Telephone Encounter (Signed)
I do not have any paperwork, if I had it it is done but I do not have it now. If it cannot be tracked down we need a new copy and I will get it done quickly. 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

## 2016-06-06 NOTE — Telephone Encounter (Signed)
Patient called to follow up on FMLA paperwork. Plse adv

## 2016-06-07 NOTE — Telephone Encounter (Signed)
Patient checking on the status of paperwork pager [phone] (ph) [phone]. Patient states she received call from [employer] [facility]) stating claim will be denied due to paperwork not being received. Please advise patient directly

## 2016-06-07 NOTE — Telephone Encounter (Signed)
The start of this note stated Ashlee put FMLA in your blue folder.

## 2016-06-08 NOTE — Telephone Encounter (Signed)
I do not have this form. I sent a note the other day saying we need to find it or callover to HR (she works with [employer]) and get the paperwork so I can complete it. Have done previously completed so print the last one to use the info. ash

## 2016-06-09 ENCOUNTER — Other Ambulatory Visit: Payer: Self-pay | Admitting: *Deleted

## 2016-06-09 NOTE — Patient Outreach (Signed)
[facility]) Care Management   06/09/2016  Eileen Richards Aug 14, 1961 [id number]  Eileen Richards is an 54 y.o. female who presents to the [place] [facility] office for routine Link To Wellness follow up for self management assistance with Type II DM, HTN, hyperlipidemia and obesity.  Subjective: Eileen Richards has no complaints, says she saw her primary care MD on 8/24 and her Hgb A1C was significantly elevated ( 8.6% ) so she was started on Victoza. She says she was initially nauseated but the nausea has resolved. She is aware of the potential side effects including pancreatitis and she is asking for education on the symptoms of pancreatitis. She says she has seen a decrease in her CBGs since starting Victoza. She reports her fasting CBG variance as 120-140.   Objective:   Review of Systems  Constitutional: Negative.     Physical Exam  Constitutional: She is oriented to person, place, and time. She appears well-developed and well-nourished.  Respiratory: Effort normal.  Neurological: She is alert and oriented to person, place, and time.  Skin: Skin is warm and dry.  Psychiatric: She has a normal mood and affect. Her behavior is normal. Judgment and thought content normal.   Filed Weights   06/09/16 1544  Weight: 240 lb (108.9 kg)   Vitals:   06/09/16 1544  BP: 128/78   Encounter Medications:   Outpatient Encounter Prescriptions as of 06/09/2016  Medication Sig Note  . albuterol (PROVENTIL HFA;VENTOLIN HFA) 108 (90 Base) MCG/ACT inhaler Inhale 2 puffs into the lungs every 6 (six) hours as needed for wheezing or shortness of breath.   Marland Kitchen aspirin 81 MG tablet Take 81 mg by mouth daily.   Marland Kitchen atorvastatin (LIPITOR) 20 MG tablet Take 1 tablet (20 mg total) by mouth daily.   . canagliflozin (INVOKANA) 100 MG TABS tablet Take 1 tablet (100 mg total) by mouth daily.   . carvedilol (COREG) 3.125 MG tablet TAKE 1 TABLET BY MOUTH 2 TIMES DAILY  WITH A MEAL   . glimepiride (AMARYL) 4 MG tablet Take 1 tablet (4 mg total) by mouth 2 (two) times daily.   . hydrochlorothiazide (HYDRODIURIL) 25 MG tablet Take 1 tablet (25 mg total) by mouth daily.   . Liraglutide (VICTOZA) 18 MG/3ML SOPN 0.6 mg [place] daily x 7 days then increase to 1.2 mg  daily 06/09/2016: Inject in the morning  . metFORMIN (GLUCOPHAGE-XR) 500 MG 24 hr tablet TAKE 2 TABLETS BY MOUTH TWICE DAILY AND 1 TABLET AT NOON   . naproxen (NAPROSYN) 500 MG tablet Take 1 tablet (500 mg total) by mouth 2 (two) times daily as needed for mild pain or moderate pain (with food).   . pantoprazole (PROTONIX) 40 MG tablet TAKE 1 TABLET BY MOUTH DAILY   . sitaGLIPtin (JANUVIA) 100 MG tablet Take 1 tablet (100 mg total) by mouth daily.   . traMADol (ULTRAM) 50 MG tablet TAKE 2 TABLETS BY MOUTH TWICE DAILY AS NEEDED   . TRUETEST TEST test strip USE TO TEST BLOOD SUGAR THREE TIMES A DAY   . Blood Glucose Monitoring Suppl (TRUETRACK BLOOD GLUCOSE) W/DEVICE KIT by Does not apply route.     . cyclobenzaprine (FLEXERIL) 10 MG tablet Take 1 tablet (10 mg total) by mouth 2 (two) times daily as needed for muscle spasms. (Patient not taking: Reported on 06/09/2016)   . Lancet Device MISC by Does not apply route. Use to test blood sugar three times daily    . meclizine (  ANTIVERT) 25 MG tablet Take 1 tablet (25 mg total) by mouth 3 (three) times daily as needed for dizziness. (Patient not taking: Reported on 06/09/2016)   . Multiple Vitamin (MULTIVITAMIN) tablet Take 1 tablet by mouth daily. Reported on 01/12/2016   . nitroGLYCERIN (NITROSTAT) 0.4 MG SL tablet Place 1 tablet (0.4 mg total) under the tongue every 5 (five) minutes as needed for chest pain. (Patient not taking: Reported on 06/09/2016)   . promethazine (PHENERGAN) 25 MG tablet Take 1 tablet (25 mg total) by mouth every 8 (eight) hours as needed for nausea or vomiting. (Patient not taking: Reported on 06/09/2016)    No facility-administered encounter  medications on file as of 06/09/2016.     Functional Status:   In your present state of health, do you have any difficulty performing the following activities: 08/06/2015  Hearing? N  Vision? N  Difficulty concentrating or making decisions? N  Walking or climbing stairs? N  Dressing or bathing? N  Doing errands, shopping? N  Some recent data might be hidden    Fall/Depression Screening:    PHQ 2/9 Scores 04/30/2015 01/22/2015  PHQ - 2 Score 0 0    Assessment:  [employer] employee and Link To wellness member with Type II DM, HTN, hyperlipidemia and obesity, meeting treatment targets for HTN only.  Plan:  [facility] CM Care Plan Problem One   Flowsheet Row Most Recent Value  Care Plan Problem One  Member with Type II DM, HTN, hyperlipidemia and morbid obesity (BMI= 41.3) with slight increase in weight since last visit, with worsening glycemic control as evidenced by most recent Hgb A1C= 8.6% on 05/12/16 with initiation of Victoza, no longer meeting treatment targets for hyperlipidemia as evidenced by abnormal non-fasting lipid profile on 05/12/16 despite statin therapy, HTN well controlled  as evidenced by BP readings <140/<90  Role Documenting the Problem One  Care Management [place] for Problem One  Active  [facility] Long Term Goal (31-90 days)  Improved glycemic control as evidenced by improved Hgb A1C,   Hgb A1C < 7.5% at next check, BP readings consistently meeting targets of <140/<90, normal lipid profile at next assessment, and evidence of no weight gain or weight loss at next measurement  [facility] Long Term Goal Start Date  06/09/16  Interventions for Problem One Long Term Goal reviewed medications and assessed adherence, reviewed in detail mechanism of action of Victoza, dosage, schedule and common side effects including nausea, vomiting, diarrhea, decreased appetite and weight loss, pancreatitis, discussed symptoms of pancreatitis, reviewed CBG readings since starting Victoza,  reviewed  results of labs drawn 05/12/16 including Hgb A1C, CMET, CBC, lipid profile and TSH, reviewed upcoming appointments on 11/28 with Dr. Randel Pigg,  arranged for Link To Wellness follow up in December       RNCM to fax today's office visit note to Dr. Randel Pigg. RNCM will meet quarterly and as needed with patient per Link To Wellness program guidelines to assist with Type II DM, HTN, hyperlipidemia and obesity self-management and assess patient's progress toward mutually set goals.  Barrington Ellison RN,CCM,CDE [place] Management Coordinator Link To Wellness Office Phone [phone] Office Fax [phone]

## 2016-06-09 NOTE — Telephone Encounter (Signed)
Pt called in to follow up on paperwork (FMLA form) informed pt of the providers message. She says that she also has a copy. She will bring PCP her copy to have updated and faxed back to HR.

## 2016-06-09 NOTE — Telephone Encounter (Signed)
Patient dropped FMLA paperwork and would like paperwork faxed to the number indicate

## 2016-06-10 NOTE — Telephone Encounter (Signed)
Called the patient to inform FMLA is ready for pickup per Ashlee's instructions--Left a detailed message that form is completed and can pickup at our office. Put at the  [place].

## 2016-06-13 NOTE — Telephone Encounter (Signed)
Patient requested forms to be fax to the # indicated on form [phone]. Fax confirmation attached to FMLA paperwork at the front desk.

## 2016-06-14 MED FILL — INVOKANA 100 MG TABLET: 100 | 30 days supply | Qty: 30 | Fill #2

## 2016-06-14 MED FILL — GLIMEPIRIDE 4 MG TABLET: 4 | 30 days supply | Qty: 60 | Fill #1

## 2016-06-14 MED FILL — PANTOPRAZOLE SOD DR 40 MG T: 40 | 30 days supply | Qty: 30 | Fill #3

## 2016-06-14 MED FILL — ATORVASTATIN 20 MG TABLET: 20 | 90 days supply | Qty: 90 | Fill #2

## 2016-06-14 MED FILL — VICTOZA 2-PAK 18 MG/3 ML PE: 18 | 30 days supply | Qty: 6 | Fill #1

## 2016-06-16 NOTE — Telephone Encounter (Signed)
Patient dropped paperwork back off stating [employer] is missing information on FMLA form page 3 #3 Intermittent leave (circle on form) and please indicate how many days patient needs out for episodes. Patient states please call Aldona Bar from [facility] [phone] and dial #[phone]. Please fax form when complete to [phone].

## 2016-06-20 NOTE — Telephone Encounter (Signed)
Called Eileen Richards with [employer] and left a message for call back.

## 2016-06-21 NOTE — Telephone Encounter (Signed)
Spoke with [employer].  States duration of episodes is missing on form and needs to be filled in.  Form completed and faxed back to [phone].  Awaiting fax confirmation.

## 2016-06-24 NOTE — Telephone Encounter (Signed)
Called Eileen Richards to verify that they received forms.  Forms were received and patient's claim has been approved.  Pt is aware.

## 2016-07-04 MED FILL — CARVEDILOL 3.125 MG TABLET: 3.125 | 90 days supply | Qty: 180 | Fill #1

## 2016-07-04 MED FILL — METFORMIN HCL ER 500 MG TAB: 500 | 30 days supply | Qty: 150 | Fill #5

## 2016-07-04 MED FILL — HYDROCHLOROTHIAZIDE 25 MG T: 25 | 90 days supply | Qty: 90 | Fill #0

## 2016-07-04 MED FILL — INVOKANA 100 MG TABLET: 100 | 30 days supply | Qty: 30 | Fill #3

## 2016-07-13 MED FILL — VICTOZA 2-PAK 18 MG/3 ML PE: 18 | 30 days supply | Qty: 6 | Fill #2

## 2016-07-29 MED FILL — GLIMEPIRIDE 4 MG TABLET: 4 | 30 days supply | Qty: 60 | Fill #2

## 2016-08-01 MED FILL — PANTOPRAZOLE SOD DR 40 MG T: 40 | 30 days supply | Qty: 30 | Fill #4

## 2016-08-01 MED FILL — JANUVIA 100 MG TABLET: 100 | 90 days supply | Qty: 90 | Fill #3

## 2016-08-01 MED FILL — METFORMIN HCL ER 500 MG TAB: 500 | 30 days supply | Qty: 150 | Fill #0

## 2016-08-01 MED FILL — INVOKANA 100 MG TABLET: 100 | 30 days supply | Qty: 30 | Fill #4

## 2016-08-08 ENCOUNTER — Other Ambulatory Visit: Payer: Self-pay | Admitting: Family Medicine

## 2016-08-08 MED FILL — UNIFINE PENTIPS 31GX3/16": 31G X 5 MM | 90 days supply | Qty: 100 | Fill #1

## 2016-08-08 MED FILL — UNIFINE PENTIPS 31GX3/16: 31G X 5 MM | 90 days supply | Qty: 100 | Fill #1

## 2016-08-08 MED FILL — VICTOZA 2-PAK 18 MG/3 ML PE: 18 | 30 days supply | Qty: 6 | Fill #0

## 2016-08-08 NOTE — Telephone Encounter (Signed)
Refill sent per [facility] refill protocol/SLS  

## 2016-08-16 ENCOUNTER — Ambulatory Visit (HOSPITAL_BASED_OUTPATIENT_CLINIC_OR_DEPARTMENT_OTHER)
Admission: RE | Admit: 2016-08-16 | Discharge: 2016-08-16 | Disposition: A | Discharge status: Home / Self Care | Payer: 59 | Source: Ambulatory Visit | Attending: Family Medicine | Admitting: Family Medicine

## 2016-08-16 ENCOUNTER — Encounter: Payer: Self-pay | Admitting: Family Medicine

## 2016-08-16 ENCOUNTER — Ambulatory Visit (INDEPENDENT_AMBULATORY_CARE_PROVIDER_SITE_OTHER): Payer: 59 | Admitting: Family Medicine

## 2016-08-16 VITALS — BP 110/64 | HR 90 | Temp 98.1°F | Ht 64.0 in | Wt 240.4 lb

## 2016-08-16 DIAGNOSIS — R1032 Left lower quadrant pain: Secondary | ICD-10-CM | POA: Diagnosis not present

## 2016-08-16 DIAGNOSIS — E1169 Type 2 diabetes mellitus with other specified complication: Secondary | ICD-10-CM | POA: Diagnosis not present

## 2016-08-16 DIAGNOSIS — M48061 Spinal stenosis, lumbar region without neurogenic claudication: Secondary | ICD-10-CM | POA: Diagnosis not present

## 2016-08-16 DIAGNOSIS — M545 Low back pain: Secondary | ICD-10-CM | POA: Diagnosis not present

## 2016-08-16 DIAGNOSIS — I10 Essential (primary) hypertension: Secondary | ICD-10-CM

## 2016-08-16 DIAGNOSIS — E669 Obesity, unspecified: Secondary | ICD-10-CM

## 2016-08-16 DIAGNOSIS — M5441 Lumbago with sciatica, right side: Secondary | ICD-10-CM

## 2016-08-16 DIAGNOSIS — E782 Mixed hyperlipidemia: Secondary | ICD-10-CM | POA: Diagnosis not present

## 2016-08-16 DIAGNOSIS — I700 Atherosclerosis of aorta: Secondary | ICD-10-CM | POA: Diagnosis not present

## 2016-08-16 NOTE — Patient Instructions (Signed)
Carbohydrate Counting for Diabetes Mellitus, Adult Carbohydrate counting is a method for keeping track of how many carbohydrates you eat. Eating carbohydrates naturally increases the amount of sugar (glucose) in the blood. Counting how many carbohydrates you eat helps keep your blood glucose within normal limits, which helps you manage your diabetes (diabetes mellitus). It is important to know how many carbohydrates you can safely have in each meal. This is different for every person. A diet and nutrition specialist (registered dietitian) can help you make a meal plan and calculate how many carbohydrates you should have at each meal and snack. Carbohydrates are found in the following foods:  Grains, such as breads and cereals.  Dried beans and soy products.  Starchy vegetables, such as potatoes, peas, and corn.  Fruit and fruit juices.  Milk and yogurt.  Sweets and snack foods, such as cake, cookies, candy, chips, and soft drinks. How do I count carbohydrates? There are two ways to count carbohydrates in food. You can use either of the methods or a combination of both. Reading "Nutrition Facts" on packaged food  The "Nutrition Facts" list is included on the labels of almost all packaged foods and beverages in the [place] It includes:  The serving size.  Information about nutrients in each serving, including the grams (g) of carbohydrate per serving. To use the "Nutrition Facts":  Decide how many servings you will have.  Multiply the number of servings by the number of carbohydrates per serving.  The resulting number is the total amount of carbohydrates that you will be having. Learning standard serving sizes of other foods  When you eat foods containing carbohydrates that are not packaged or do not include "Nutrition Facts" on the label, you need to measure the servings in order to count the amount of carbohydrates:  Measure the foods that you will eat with a food scale or measuring  cup, if needed.  Decide how many standard-size servings you will eat.  Multiply the number of servings by 15. Most carbohydrate-rich foods have about 15 g of carbohydrates per serving.  For example, if you eat 8 oz (170 g) of strawberries, you will have eaten 2 servings and 30 g of carbohydrates (2 servings x 15 g = 30 g).  For foods that have more than one food mixed, such as soups and casseroles, you must count the carbohydrates in each food that is included. The following list contains standard serving sizes of common carbohydrate-rich foods. Each of these servings has about 15 g of carbohydrates:   hamburger bun or  English muffin.   oz (15 mL) syrup.   oz (14 g) jelly.  1 slice of bread.  1 six-inch tortilla.  3 oz (85 g) cooked rice or pasta.  4 oz (113 g) cooked dried beans.  4 oz (113 g) starchy vegetable, such as peas, corn, or potatoes.  4 oz (113 g) hot cereal.  4 oz (113 g) mashed potatoes or  of a large baked potato.  4 oz (113 g) canned or frozen fruit.  4 oz (120 mL) fruit juice.  4-6 crackers.  6 chicken nuggets.  6 oz (170 g) unsweetened dry cereal.  6 oz (170 g) plain fat-free yogurt or yogurt sweetened with artificial sweeteners.  8 oz (240 mL) milk.  8 oz (170 g) fresh fruit or one small piece of fruit.  24 oz (680 g) popped popcorn. Example of carbohydrate counting Sample meal  3 oz (85 g) chicken breast.  6 oz (  170 g) brown rice.  4 oz (113 g) corn.  8 oz (240 mL) milk.  8 oz (170 g) strawberries with sugar-free whipped topping. Carbohydrate calculation 1. Identify the foods that contain carbohydrates:  Rice.  Corn.  Milk.  Strawberries. 2. Calculate how many servings you have of each food:  2 servings rice.  1 serving corn.  1 serving milk.  1 serving strawberries. 3. Multiply each number of servings by 15 g:  2 servings rice x 15 g = 30 g.  1 serving corn x 15 g = 15 g.  1 serving milk x 15 g = 15  g.  1 serving strawberries x 15 g = 15 g. 4. Add together all of the amounts to find the total grams of carbohydrates eaten:  30 g + 15 g + 15 g + 15 g = 75 g of carbohydrates total. This information is not intended to replace advice given to you by your health care provider. Make sure you discuss any questions you have with your health care provider. Document Released: 09/05/2005 Document Revised: 03/25/2016 Document Reviewed: 02/17/2016 Elsevier Interactive Patient Education  2017 Elsevier Inc.  

## 2016-08-16 NOTE — Progress Notes (Signed)
Pre visit review using our clinic review tool, if applicable. No additional management support is needed unless otherwise documented below in the visit note. 

## 2016-08-21 DIAGNOSIS — R1032 Left lower quadrant pain: Secondary | ICD-10-CM | POA: Insufficient documentation

## 2016-08-21 DIAGNOSIS — M5441 Lumbago with sciatica, right side: Secondary | ICD-10-CM | POA: Insufficient documentation

## 2016-08-21 DIAGNOSIS — M25551 Pain in right hip: Secondary | ICD-10-CM | POA: Insufficient documentation

## 2016-08-21 NOTE — Assessment & Plan Note (Signed)
Referred back to gastroenterology, Dr Collene Mares for further consideration.

## 2016-08-21 NOTE — Assessment & Plan Note (Signed)
minimize simple carbs. Increase exercise as tolerated.  

## 2016-08-21 NOTE — Assessment & Plan Note (Addendum)
Encouraged moist heat and gentle stretching as tolerated. May try NSAIDs and prescription meds as directed and report if symptoms worsen or seek immediate care, xray confirms mild DDD will proceed with further imaging if pain worsens

## 2016-08-21 NOTE — Assessment & Plan Note (Signed)
Tolerating statin, encouraged heart healthy diet, avoid trans fats, minimize simple carbs and saturated fats. Increase exercise as tolerated 

## 2016-08-21 NOTE — Assessment & Plan Note (Signed)
Well controlled, no changes to meds. Encouraged heart healthy diet such as the DASH diet and exercise as tolerated.  °

## 2016-08-21 NOTE — Progress Notes (Signed)
Patient ID: Eileen Richards, female   DOB: 1961-02-07, 55 y.o.   MRN: [id number]   Subjective:    Patient ID: Eileen Richards, female    DOB: May 11, 1961, 55 y.o.   MRN: [id number]  Chief Complaint  Patient presents with  . Follow-up    HPI Patient is in today for follow up. She continues to struggle with abdominal pain despite a thorough work up with GYN and imaging. She is requesting further evaluation by her gastroenterology. No bloody or tarry stool. No polyuria or polydipsia. Sugars have been 100s recently. Is struggling with increased low back pain and radicular pain down right leg at times. No incontinence or recent injury. Denies CP/palp/SOB/HA/congestion/fevers/GI or GU c/o. Taking meds as prescribed Past Medical History:  Diagnosis Date  . Achilles tendonitis 10/14/2012  . Asthma    childhood  . Atypical chest pain    normal stress echo 02/2009 -LV size was normal LV global systolic function was normal- Normal wall motion; no LV regional wall motion abnormalities  . Benign paroxysmal positional vertigo 04/26/2015  . Chest pain, atypical 08/24/2013  . Diabetes mellitus   . GERD (gastroesophageal reflux disease)   . Hand pain, left 11/10/2015  . Hyperlipemia   . Hypertension   . Onychomycosis 04/17/2014  . Pain of right heel 10/14/2012  . Preventative health care 11/22/2015  . Sinusitis, acute 01/17/2016  . Thoracic outlet syndrome 11/03/2014  . Urinary urgency 12/15/2015    Past Surgical History:  Procedure Laterality Date  . ABDOMINAL HYSTERECTOMY    . CYSTOSCOPY  07/23/2012   Procedure: CYSTOSCOPY;  Surgeon: Marvene Staff, MD;  Location: [facility];  Service: Gynecology;  Laterality: N/A;  . DILATION AND CURETTAGE OF UTERUS     Lysis of Adhesions  . ROBOTIC ASSISTED LAPAROSCOPIC LYSIS OF ADHESION  07/23/2012   Procedure: ROBOTIC ASSISTED LAPAROSCOPIC LYSIS OF ADHESION;  Surgeon: Marvene Staff, MD;  Location: [facility];  Service: Gynecology;  Laterality: N/A;  . ROBOTIC  ASSISTED SALPINGO OOPHERECTOMY  07/23/2012   Procedure: ROBOTIC ASSISTED SALPINGO OOPHERECTOMY;  Surgeon: Marvene Staff, MD;  Location: [facility];  Service: Gynecology;  Laterality: Bilateral;    Family History  Problem Relation Age of Onset  . GER disease Mother   . GER disease Father   . Heart disease Father     father has "heart problems"    Social History   Social History  . Marital status: Single    Spouse name: N/A  . Number of children: N/A  . Years of education: N/A   Occupational History  . Not on file.   Social History Main Topics  . Smoking status: Former Research scientist (life sciences)  . Smokeless tobacco: Never Used  . Alcohol use No  . Drug use: No  . Sexual activity: Not on file   Other Topics Concern  . Not on file   Social History Narrative  . No narrative on file    Outpatient Medications Prior to Visit  Medication Sig Dispense Refill  . albuterol (PROVENTIL HFA;VENTOLIN HFA) 108 (90 Base) MCG/ACT inhaler Inhale 2 puffs into the lungs every 6 (six) hours as needed for wheezing or shortness of breath. 1 Inhaler 6  . aspirin 81 MG tablet Take 81 mg by mouth daily.    Marland Kitchen atorvastatin (LIPITOR) 20 MG tablet Take 1 tablet (20 mg total) by mouth daily. 90 tablet 3  . Blood Glucose Monitoring Suppl (TRUETRACK BLOOD GLUCOSE) W/DEVICE KIT by Does not apply route.      Marland Kitchen  canagliflozin (INVOKANA) 100 MG TABS tablet Take 1 tablet (100 mg total) by mouth daily. 30 tablet 5  . carvedilol (COREG) 3.125 MG tablet TAKE 1 TABLET BY MOUTH 2 TIMES DAILY WITH A MEAL 60 tablet 6  . cyclobenzaprine (FLEXERIL) 10 MG tablet Take 1 tablet (10 mg total) by mouth 2 (two) times daily as needed for muscle spasms. 20 tablet 0  . glimepiride (AMARYL) 4 MG tablet Take 1 tablet (4 mg total) by mouth 2 (two) times daily. 60 tablet 6  . hydrochlorothiazide (HYDRODIURIL) 25 MG tablet Take 1 tablet (25 mg total) by mouth daily. 90 tablet 5  . Lancet Device MISC by Does not apply route. Use to test blood sugar  three times daily     . liraglutide (VICTOZA) 18 MG/3ML SOPN Inject 0.2 mLs (1.2 mg total) into the skin daily. 6 mL 2  . meclizine (ANTIVERT) 25 MG tablet Take 1 tablet (25 mg total) by mouth 3 (three) times daily as needed for dizziness. 30 tablet 0  . metFORMIN (GLUCOPHAGE-XR) 500 MG 24 hr tablet TAKE 2 TABLETS BY MOUTH TWICE DAILY AND 1 TABLET AT NOON 150 tablet 5  . Multiple Vitamin (MULTIVITAMIN) tablet Take 1 tablet by mouth daily. Reported on 01/12/2016    . naproxen (NAPROSYN) 500 MG tablet Take 1 tablet (500 mg total) by mouth 2 (two) times daily as needed for mild pain or moderate pain (with food). 60 tablet 1  . nitroGLYCERIN (NITROSTAT) 0.4 MG SL tablet Place 1 tablet (0.4 mg total) under the tongue every 5 (five) minutes as needed for chest pain. 25 tablet 3  . pantoprazole (PROTONIX) 40 MG tablet TAKE 1 TABLET BY MOUTH DAILY 30 tablet 5  . promethazine (PHENERGAN) 25 MG tablet Take 1 tablet (25 mg total) by mouth every 8 (eight) hours as needed for nausea or vomiting. 20 tablet 0  . sitaGLIPtin (JANUVIA) 100 MG tablet Take 1 tablet (100 mg total) by mouth daily. 30 tablet 6  . traMADol (ULTRAM) 50 MG tablet TAKE 2 TABLETS BY MOUTH TWICE DAILY AS NEEDED 60 tablet PRN  . TRUETEST TEST test strip USE TO TEST BLOOD SUGAR THREE TIMES A DAY 100 each 11   No facility-administered medications prior to visit.     No Known Allergies  Review of Systems  Constitutional: Positive for malaise/fatigue. Negative for fever.  HENT: Negative for congestion.   Eyes: Negative for blurred vision.  Respiratory: Negative for shortness of breath.   Cardiovascular: Negative for chest pain, palpitations and leg swelling.  Gastrointestinal: Positive for abdominal pain. Negative for blood in stool and nausea.  Genitourinary: Negative for dysuria and frequency.  Musculoskeletal: Positive for back pain and joint pain. Negative for falls.  Skin: Negative for rash.  Neurological: Negative for dizziness,  loss of consciousness and headaches.  Endo/Heme/Allergies: Negative for environmental allergies.  Psychiatric/Behavioral: Negative for depression. The patient is not nervous/anxious.        Objective:    Physical Exam  Constitutional: She is oriented to person, place, and time. She appears well-developed and well-nourished. No distress.  HENT:  Head: Normocephalic and atraumatic.  Nose: Nose normal.  Eyes: Right eye exhibits no discharge. Left eye exhibits no discharge.  Neck: Normal range of motion. Neck supple.  Cardiovascular: Normal rate and regular rhythm.   No murmur heard. Pulmonary/Chest: Effort normal and breath sounds normal.  Abdominal: Soft. Bowel sounds are normal. There is no tenderness.  Musculoskeletal: She exhibits no edema.  Neurological: She is alert  and oriented to person, place, and time.  Skin: Skin is warm and dry.  Psychiatric: She has a normal mood and affect.  Nursing note and vitals reviewed.   BP 110/64 (BP Location: Left Arm, Patient Position: Sitting, Cuff Size: Large)   Pulse 90   Temp 98.1 F (36.7 C) (Oral)   Ht '5\' 4"'$  (1.626 m)   Wt 240 lb 6 oz (109 kg)   SpO2 97%   BMI 41.26 kg/m  Wt Readings from Last 3 Encounters:  08/16/16 240 lb 6 oz (109 kg)  06/09/16 240 lb (108.9 kg)  05/12/16 238 lb 2 oz (108 kg)     Lab Results  Component Value Date   WBC 12.8 (H) 05/12/2016   HGB 15.1 (H) 05/12/2016   HCT 44.5 05/12/2016   PLT 379.0 05/12/2016   GLUCOSE 111 (H) 05/12/2016   CHOL 193 05/12/2016   TRIG 201.0 (H) 05/12/2016   HDL 40.50 05/12/2016   LDLDIRECT 123.0 05/12/2016   LDLCALC 102 (H) 02/09/2016   ALT 14 05/12/2016   AST 17 05/12/2016   NA 140 05/12/2016   K 4.0 05/12/2016   CL 100 05/12/2016   CREATININE 1.08 05/12/2016   BUN 13 05/12/2016   CO2 31 05/12/2016   TSH 1.77 05/12/2016   HGBA1C 8.6 (H) 05/12/2016   MICROALBUR 0.9 11/12/2015    Lab Results  Component Value Date   TSH 1.77 05/12/2016   Lab Results    Component Value Date   WBC 12.8 (H) 05/12/2016   HGB 15.1 (H) 05/12/2016   HCT 44.5 05/12/2016   MCV 89.0 05/12/2016   PLT 379.0 05/12/2016   Lab Results  Component Value Date   NA 140 05/12/2016   K 4.0 05/12/2016   CO2 31 05/12/2016   GLUCOSE 111 (H) 05/12/2016   BUN 13 05/12/2016   CREATININE 1.08 05/12/2016   BILITOT 0.5 05/12/2016   ALKPHOS 92 05/12/2016   AST 17 05/12/2016   ALT 14 05/12/2016   PROT 7.7 05/12/2016   ALBUMIN 4.2 05/12/2016   CALCIUM 9.8 05/12/2016   GFR 68.11 05/12/2016   Lab Results  Component Value Date   CHOL 193 05/12/2016   Lab Results  Component Value Date   HDL 40.50 05/12/2016   Lab Results  Component Value Date   LDLCALC 102 (H) 02/09/2016   Lab Results  Component Value Date   TRIG 201.0 (H) 05/12/2016   Lab Results  Component Value Date   CHOLHDL 5 05/12/2016   Lab Results  Component Value Date   HGBA1C 8.6 (H) 05/12/2016       Assessment & Plan:   Problem List Items Addressed This Visit    Diabetes mellitus type 2 in obese ([place])     minimize simple carbs. Increase exercise as tolerated.       Relevant Orders   Hemoglobin A1c   Hyperlipidemia, mixed    Tolerating statin, encouraged heart healthy diet, avoid trans fats, minimize simple carbs and saturated fats. Increase exercise as tolerated      Relevant Orders   Lipid panel   Essential hypertension    Well controlled, no changes to meds. Encouraged heart healthy diet such as the DASH diet and exercise as tolerated.       LLQ pain    Referred back to gastroenterology, Dr Collene Mares for further consideration.       Relevant Orders   Ambulatory referral to Gastroenterology   Low back pain with right-sided sciatica - Primary  Encouraged moist heat and gentle stretching as tolerated. May try NSAIDs and prescription meds as directed and report if symptoms worsen or seek immediate care, xray confirms mild DDD will proceed with further imaging if pain worsens       Relevant Orders   DG Lumbar Spine Complete (Completed)    Other Visit Diagnoses    Hypertension, unspecified type       Relevant Orders   CBC w/Diff   TSH   Comprehensive metabolic panel      I am having Ms. Butler maintain her Lancet Device, TRUETRACK BLOOD GLUCOSE, multivitamin, aspirin, traMADol, naproxen, albuterol, pantoprazole, cyclobenzaprine, promethazine, nitroGLYCERIN, canagliflozin, metFORMIN, sitaGLIPtin, hydrochlorothiazide, glimepiride, carvedilol, atorvastatin, TRUETEST TEST, meclizine, and liraglutide.  No orders of the defined types were placed in this encounter.    Penni Homans, MD

## 2016-09-01 ENCOUNTER — Other Ambulatory Visit: Payer: Self-pay | Admitting: *Deleted

## 2016-09-01 ENCOUNTER — Encounter: Payer: Self-pay | Admitting: *Deleted

## 2016-09-01 ENCOUNTER — Other Ambulatory Visit (INDEPENDENT_AMBULATORY_CARE_PROVIDER_SITE_OTHER): Payer: 59

## 2016-09-01 DIAGNOSIS — I10 Essential (primary) hypertension: Secondary | ICD-10-CM | POA: Diagnosis not present

## 2016-09-01 DIAGNOSIS — E782 Mixed hyperlipidemia: Secondary | ICD-10-CM

## 2016-09-01 DIAGNOSIS — E1169 Type 2 diabetes mellitus with other specified complication: Secondary | ICD-10-CM

## 2016-09-01 DIAGNOSIS — E669 Obesity, unspecified: Secondary | ICD-10-CM

## 2016-09-01 LAB — COMPREHENSIVE METABOLIC PANEL
ALT: 14 U/L (ref 0–35)
AST: 12 U/L (ref 0–37)
Albumin: 4.1 g/dL (ref 3.5–5.2)
Alkaline Phosphatase: 90 U/L (ref 39–117)
BUN: 15 mg/dL (ref 6–23)
CO2: 33 mEq/L — ABNORMAL HIGH (ref 19–32)
Calcium: 9.7 mg/dL (ref 8.4–10.5)
Chloride: 101 mEq/L (ref 96–112)
Creatinine, Ser: 0.76 mg/dL (ref 0.40–1.20)
GFR: 102.06 mL/min (ref >60.00)
Glucose, Bld: 175 mg/dL — ABNORMAL HIGH (ref 70–99)
Potassium: 3.5 mEq/L (ref 3.5–5.1)
Sodium: 140 mEq/L (ref 135–145)
Total Bilirubin: 0.4 mg/dL (ref 0.2–1.2)
Total Protein: 7.2 g/dL (ref 6.0–8.3)

## 2016-09-01 LAB — LIPID PANEL
Cholesterol: 178 mg/dL (ref 0–200)
HDL: 40.4 mg/dL (ref >39.00)
LDL Cholesterol: 119 mg/dL — ABNORMAL HIGH (ref 0–99)
NonHDL: 137.49
Total CHOL/HDL Ratio: 4
Triglycerides: 94 mg/dL (ref 0.0–149.0)
VLDL: 18.8 mg/dL (ref 0.0–40.0)

## 2016-09-01 LAB — HEMOGLOBIN A1C: Hgb A1c MFr Bld: 7.3 % — ABNORMAL HIGH (ref 4.6–6.5)

## 2016-09-01 LAB — TSH: TSH: 1.27 u[IU]/mL (ref 0.35–4.50)

## 2016-09-01 NOTE — Patient Outreach (Signed)
[facility] Care Management   09/01/2016  Eileen Richards 10-25-1960 [id number]  Eileen Richards is an 55 y.o. female who presents to the [place] Management office for routine Link To Wellness follow up for self management assistance with Type II DM, HTN, hyperlipidemia and obesity.  Subjective: Eileen Richards has no complaints, says she saw her primary care MD on 08/16/16 and she had labs drawn today but does not know the results yet.  She says she continues to see a decrease in her CBGs since starting Victoza so she hopes her Hgb A1C is improved.   Objective:   Review of Systems  Constitutional: Negative.     Physical Exam  Constitutional: She is oriented to person, place, and time. She appears well-developed and well-nourished.  Respiratory: Effort normal.  Neurological: She is alert and oriented to person, place, and time.  Skin: Skin is warm and dry.  Psychiatric: She has a normal mood and affect. Her behavior is normal. Judgment and thought content normal.   Filed Weights   09/01/16 1501  Weight: 235 lb 12.8 oz (107 kg)   Vitals:   09/01/16 1501  BP: 124/74   Encounter Medications:   Outpatient Encounter Prescriptions as of 09/01/2016  Medication Sig  . albuterol (PROVENTIL HFA;VENTOLIN HFA) 108 (90 Base) MCG/ACT inhaler Inhale 2 puffs into the lungs every 6 (six) hours as needed for wheezing or shortness of breath.  Marland Kitchen aspirin 81 MG tablet Take 81 mg by mouth daily.  Marland Kitchen atorvastatin (LIPITOR) 20 MG tablet Take 1 tablet (20 mg total) by mouth daily.  . Blood Glucose Monitoring Suppl (TRUETRACK BLOOD GLUCOSE) W/DEVICE KIT by Does not apply route.    . canagliflozin (INVOKANA) 100 MG TABS tablet Take 1 tablet (100 mg total) by mouth daily.  . carvedilol (COREG) 3.125 MG tablet TAKE 1 TABLET BY MOUTH 2 TIMES DAILY WITH A MEAL  . cyclobenzaprine (FLEXERIL) 10 MG tablet Take 1 tablet (10 mg total) by mouth 2 (two) times daily as  needed for muscle spasms.  Marland Kitchen glimepiride (AMARYL) 4 MG tablet Take 1 tablet (4 mg total) by mouth 2 (two) times daily.  . hydrochlorothiazide (HYDRODIURIL) 25 MG tablet Take 1 tablet (25 mg total) by mouth daily.  Elmore Guise Device MISC by Does not apply route. Use to test blood sugar three times daily   . liraglutide (VICTOZA) 18 MG/3ML SOPN Inject 0.2 mLs (1.2 mg total) into the skin daily.  . meclizine (ANTIVERT) 25 MG tablet Take 1 tablet (25 mg total) by mouth 3 (three) times daily as needed for dizziness.  . metFORMIN (GLUCOPHAGE-XR) 500 MG 24 hr tablet TAKE 2 TABLETS BY MOUTH TWICE DAILY AND 1 TABLET AT NOON  . naproxen (NAPROSYN) 500 MG tablet Take 1 tablet (500 mg total) by mouth 2 (two) times daily as needed for mild pain or moderate pain (with food).  . pantoprazole (PROTONIX) 40 MG tablet TAKE 1 TABLET BY MOUTH DAILY  . promethazine (PHENERGAN) 25 MG tablet Take 1 tablet (25 mg total) by mouth every 8 (eight) hours as needed for nausea or vomiting.  . sitaGLIPtin (JANUVIA) 100 MG tablet Take 1 tablet (100 mg total) by mouth daily.  . traMADol (ULTRAM) 50 MG tablet TAKE 2 TABLETS BY MOUTH TWICE DAILY AS NEEDED  . TRUETEST TEST test strip USE TO TEST BLOOD SUGAR THREE TIMES A DAY  . Multiple Vitamin (MULTIVITAMIN) tablet Take 1 tablet by mouth daily. Reported on 01/12/2016  . nitroGLYCERIN (  NITROSTAT) 0.4 MG SL tablet Place 1 tablet (0.4 mg total) under the tongue every 5 (five) minutes as needed for chest pain. (Patient not taking: Reported on 09/01/2016)   No facility-administered encounter medications on file as of 09/01/2016.     Functional Status:   In your present state of health, do you have any difficulty performing the following activities: 08/16/2016  Hearing? N  Vision? N  Difficulty concentrating or making decisions? N  Walking or climbing stairs? N  Dressing or bathing? N  Doing errands, shopping? N  Some recent data might be hidden    Fall/Depression Screening:     PHQ 2/9 Scores 04/30/2015 01/22/2015  PHQ - 2 Score 0 0    Assessment:  [employer] employee and Link To Wellness member, enrolled in the Careers information officer for 2018,  with Type II DM, HTN, hyperlipidemia and obesity, meeting treatment targets for HTN only.  Plan:  [facility] CM Care Plan Problem One   Flowsheet Row Most Recent Value  Care Plan Problem One Member with Type II DM, HTN, hyperlipidemia and morbid obesity but with evidence of weight loss since starting Victoza,  (body mass index  now  40.6 previously 41.3), with significant improvement in glycemic control as evidenced by most recent Hgb A1C= 7.3% previously 8.6% with initiation of Victoza, no longer meeting treatment targets for hyperlipidemia as evidenced by abnormal lipid profile on 09/01/16 despite statin therapy, HTN well controlled  as evidenced by BP readings <130/<80  Role Documenting the Problem One  Care Management Coordinator  Care Plan for Problem One  Active  [facility] Long Term Goal (31-90 days) Ongoing improvement in  glycemic control as evidenced by  Hgb A1C < 7.0% at next check, BP readings consistently meeting targets of <130/<80, normal lipid profile at next assessment, and evidence of ongoing weight loss at next measurement  [facility] Long Term Goal Start Date  09/01/16  Interventions for Problem One Long Term Goal Received update on patient's Mom who had emergency bowel surgery and was in the hospital for >one month, reviewed medications and assessed adherence,  reviewed CBG readings since starting Victoza, reviewed upcoming appointments on 10/31/16 with Dr. Randel Pigg, ensured that patient is enrolled in [facility] and scheduled onboarding appointment in January, educated [name] regarding ongoing disease self management assistance will occur via [facility] in the place of Link To Wellness beginning in January 2018      RNCM to fax today's office visit note to Dr. Randel Pigg.   Barrington Ellison RN,CCM,CDE [place] Management Coordinator Link To Wellness Office Phone [phone] Office Fax [phone]

## 2016-09-02 ENCOUNTER — Other Ambulatory Visit: Payer: Self-pay | Admitting: Family Medicine

## 2016-09-02 LAB — CBC WITH DIFFERENTIAL/PLATELET
Basophils Absolute: 0 10*3/uL (ref 0.0–0.1)
Basophils Relative: 0.3 % (ref 0.0–3.0)
Eosinophils Absolute: 0.3 10*3/uL (ref 0.0–0.7)
Eosinophils Relative: 3.1 % (ref 0.0–5.0)
HCT: 43.7 % (ref 36.0–46.0)
Hemoglobin: 14.5 g/dL (ref 12.0–15.0)
Lymphocytes Relative: 20.7 % (ref 12.0–46.0)
Lymphs Abs: 2.1 10*3/uL (ref 0.7–4.0)
MCHC: 33.2 g/dL (ref 30.0–36.0)
MCV: 89.7 fl (ref 78.0–100.0)
Monocytes Absolute: 0.2 10*3/uL (ref 0.1–1.0)
Monocytes Relative: 2.2 % — ABNORMAL LOW (ref 3.0–12.0)
Neutro Abs: 7.5 10*3/uL (ref 1.4–7.7)
Neutrophils Relative %: 73.7 % (ref 43.0–77.0)
Platelets: 433 10*3/uL — ABNORMAL HIGH (ref 150.0–400.0)
RBC: 4.87 Mil/uL (ref 3.87–5.11)
RDW: 14.3 % (ref 11.5–15.5)
WBC: 10.1 10*3/uL (ref 4.0–10.5)

## 2016-09-02 MED ORDER — LIRAGLUTIDE 18 MG/3ML ~~LOC~~ SOPN: 1.8000 mg | PEN_INJECTOR | Freq: Every day | SUBCUTANEOUS | 6 refills | Status: DC

## 2016-09-05 MED FILL — VICTOZA 18 MG/3 ML INJECT P: 18 | 30 days supply | Qty: 9 | Fill #0

## 2016-09-06 MED FILL — GLIMEPIRIDE 4 MG TABLET: 4 | 30 days supply | Qty: 60 | Fill #3

## 2016-09-06 MED FILL — INVOKANA 100 MG TABLET: 100 | 30 days supply | Qty: 30 | Fill #5

## 2016-09-15 DIAGNOSIS — R1032 Left lower quadrant pain: Secondary | ICD-10-CM | POA: Diagnosis not present

## 2016-09-15 DIAGNOSIS — K219 Gastro-esophageal reflux disease without esophagitis: Secondary | ICD-10-CM | POA: Diagnosis not present

## 2016-09-15 DIAGNOSIS — M25552 Pain in left hip: Secondary | ICD-10-CM | POA: Diagnosis not present

## 2016-09-15 DIAGNOSIS — M25551 Pain in right hip: Secondary | ICD-10-CM | POA: Diagnosis not present

## 2016-10-06 ENCOUNTER — Other Ambulatory Visit: Payer: Self-pay | Admitting: Family Medicine

## 2016-10-06 MED FILL — CARVEDILOL 3.125 MG TABLET: 3.125 | 30 days supply | Qty: 60 | Fill #2

## 2016-10-06 MED FILL — PANTOPRAZOLE SOD DR 40 MG T: 40 | 30 days supply | Qty: 30 | Fill #5

## 2016-10-06 MED FILL — ATORVASTATIN 20 MG TABLET: 20 | 90 days supply | Qty: 90 | Fill #0

## 2016-10-06 MED FILL — HYDROCHLOROTHIAZIDE 25 MG T: 25 | 90 days supply | Qty: 90 | Fill #1

## 2016-10-06 MED FILL — VICTOZA 18 MG/3 ML INJECT P: 18 | 30 days supply | Qty: 9 | Fill #1

## 2016-10-06 MED FILL — GLIMEPIRIDE 4 MG TABLET: 4 | 30 days supply | Qty: 60 | Fill #4

## 2016-10-06 MED FILL — METFORMIN HCL ER 500 MG TAB: 500 | 30 days supply | Qty: 150 | Fill #1

## 2016-10-07 MED FILL — INVOKANA 100 MG TABLET: 100 | 30 days supply | Qty: 30 | Fill #0

## 2016-10-27 DIAGNOSIS — E119 Type 2 diabetes mellitus without complications: Secondary | ICD-10-CM | POA: Diagnosis not present

## 2016-10-27 DIAGNOSIS — H25043 Posterior subcapsular polar age-related cataract, bilateral: Secondary | ICD-10-CM | POA: Diagnosis not present

## 2016-10-27 DIAGNOSIS — H2513 Age-related nuclear cataract, bilateral: Secondary | ICD-10-CM | POA: Diagnosis not present

## 2016-10-27 DIAGNOSIS — H40033 Anatomical narrow angle, bilateral: Secondary | ICD-10-CM | POA: Diagnosis not present

## 2016-10-27 DIAGNOSIS — H25013 Cortical age-related cataract, bilateral: Secondary | ICD-10-CM | POA: Diagnosis not present

## 2016-10-27 DIAGNOSIS — H11423 Conjunctival edema, bilateral: Secondary | ICD-10-CM | POA: Diagnosis not present

## 2016-10-27 DIAGNOSIS — H11153 Pinguecula, bilateral: Secondary | ICD-10-CM | POA: Diagnosis not present

## 2016-10-27 DIAGNOSIS — H18413 Arcus senilis, bilateral: Secondary | ICD-10-CM | POA: Diagnosis not present

## 2016-10-27 DIAGNOSIS — H35373 Puckering of macula, bilateral: Secondary | ICD-10-CM | POA: Diagnosis not present

## 2016-10-31 ENCOUNTER — Ambulatory Visit (INDEPENDENT_AMBULATORY_CARE_PROVIDER_SITE_OTHER): Payer: 59 | Admitting: Family Medicine

## 2016-10-31 ENCOUNTER — Encounter: Payer: Self-pay | Admitting: Family Medicine

## 2016-10-31 VITALS — BP 124/70 | HR 71 | Temp 97.6°F | Ht 64.0 in | Wt 237.2 lb

## 2016-10-31 DIAGNOSIS — Z0000 Encounter for general adult medical examination without abnormal findings: Secondary | ICD-10-CM

## 2016-10-31 DIAGNOSIS — E669 Obesity, unspecified: Secondary | ICD-10-CM | POA: Diagnosis not present

## 2016-10-31 DIAGNOSIS — R079 Chest pain, unspecified: Secondary | ICD-10-CM | POA: Diagnosis not present

## 2016-10-31 DIAGNOSIS — I10 Essential (primary) hypertension: Secondary | ICD-10-CM | POA: Diagnosis not present

## 2016-10-31 DIAGNOSIS — E6609 Other obesity due to excess calories: Secondary | ICD-10-CM

## 2016-10-31 DIAGNOSIS — E782 Mixed hyperlipidemia: Secondary | ICD-10-CM

## 2016-10-31 DIAGNOSIS — E1169 Type 2 diabetes mellitus with other specified complication: Secondary | ICD-10-CM

## 2016-10-31 DIAGNOSIS — R0789 Other chest pain: Secondary | ICD-10-CM

## 2016-10-31 MED ORDER — CYCLOBENZAPRINE HCL 10 MG PO TABS: 10.0000 mg | ORAL_TABLET | Freq: Two times a day (BID) | ORAL | 0 refills | Status: DC | PRN

## 2016-10-31 MED ORDER — ALBUTEROL SULFATE HFA 108 (90 BASE) MCG/ACT IN AERS: 2.0000 | INHALATION_SPRAY | Freq: Four times a day (QID) | RESPIRATORY_TRACT | 6 refills | Status: DC | PRN

## 2016-10-31 MED ORDER — TRAMADOL HCL 50 MG PO TABS: ORAL_TABLET | ORAL | 99 refills | Status: DC

## 2016-10-31 MED ORDER — NITROGLYCERIN 0.4 MG SL SUBL: 0.4000 mg | SUBLINGUAL_TABLET | SUBLINGUAL | 3 refills | Status: DC | PRN

## 2016-10-31 MED FILL — VENTOLIN HFA 90 MCG INHALER: 108 (90 BAS | 25 days supply | Qty: 18 | Fill #0

## 2016-10-31 MED FILL — traMADol HCL 50 MG TABS: 50 | 15 days supply | Qty: 60 | Fill #0

## 2016-10-31 MED FILL — NITROGLYCERIN 0.4 MG TAB SL: 0.4 | 5 days supply | Qty: 25 | Fill #0

## 2016-10-31 MED FILL — CYCLOBENZAPRINE 10 MG TAB: 10 | 10 days supply | Qty: 20 | Fill #0

## 2016-10-31 NOTE — Assessment & Plan Note (Signed)
Well controlled, no changes to meds. Encouraged heart healthy diet such as the DASH diet and exercise as tolerated.  °

## 2016-10-31 NOTE — Assessment & Plan Note (Addendum)
Encouraged DASH diet, decrease po intake and increase exercise as tolerated. Needs 7-8 hours of sleep nightly. Avoid trans fats, eat small, frequent meals every 4-5 hours with lean proteins, complex carbs and healthy fats. Minimize simple carbs, given info for Bariatrics and has joined the [employer] and is trying to eat.

## 2016-10-31 NOTE — Assessment & Plan Note (Signed)
Patient encouraged to maintain heart healthy diet, regular exercise, adequate sleep. Consider daily probiotics. Take medications as prescribed. Given and reviewed copy of ACP documents from [place] Secretary of State and encouraged to complete and return 

## 2016-10-31 NOTE — Assessment & Plan Note (Addendum)
Has recurred in the past month is occurring a couple times a week and lasts a couple of minutes. No associated symptoms. Does not awaken her. Cannot identify a pattern as to when this occurs. Has some expired SL NTG, she has not used them is given a refill, continue daily 81 mg aspirin

## 2016-10-31 NOTE — Progress Notes (Signed)
Patient ID: Eileen Richards, female   DOB: 15-Apr-1961, 56 y.o.   MRN: [id number]   Subjective:    Patient ID: Eileen Richards, female    DOB: Feb 16, 1961, 56 y.o.   MRN: [id number]  Chief Complaint  Patient presents with  . Annual Exam  . Hypertension  . Gastroesophageal Reflux    Hypertension  This is a chronic problem. The problem is controlled. Associated symptoms include chest pain, malaise/fatigue and palpitations. Pertinent negatives include no blurred vision, headaches or shortness of breath. The current treatment provides mild improvement.  Gastroesophageal Reflux  She complains of chest pain. She reports no coughing. The problem occurs occasionally. The problem has been unchanged. The treatment provided no relief.    Patient is in today for an annual examination. Patient has a Hx of hypertension, GERD, edema which has been controlled recently, obesity. She is noting episodes of recurrent chest pain, she denies any obvious pattern but it does not awaken her from sleep and she has no associated symptoms. She has historically struggled with palpitations but this is not a significant concern at this time and does not occur with the CP. No SOB, diaphoresis, nausea or syncope. No recent febrile illness or hospitalizations. Trying to stay active and has joined the [facility] applications and has had some weight loss and feels better. No polyuria or polydipsia.  I acted as a Education administrator for Penni Homans, MD. Raiford Noble, [place]   Past Medical History:  Diagnosis Date  . Achilles tendonitis 10/14/2012  . Asthma    childhood  . Atypical chest pain    normal stress echo 02/2009 -LV size was normal LV global systolic function was normal- Normal wall motion; no LV regional wall motion abnormalities  . Benign paroxysmal positional vertigo 04/26/2015  . Chest pain, atypical 08/24/2013  . Diabetes mellitus   . GERD (gastroesophageal reflux disease)   . Hand pain, left 11/10/2015  . Hyperlipemia   .  Hypertension   . Onychomycosis 04/17/2014  . Pain of right heel 10/14/2012  . Preventative health care 11/22/2015  . Sinusitis, acute 01/17/2016  . Thoracic outlet syndrome 11/03/2014  . Urinary urgency 12/15/2015    Past Surgical History:  Procedure Laterality Date  . ABDOMINAL HYSTERECTOMY    . CYSTOSCOPY  07/23/2012   Procedure: CYSTOSCOPY;  Surgeon: Marvene Staff, MD;  Location: [facility];  Service: Gynecology;  Laterality: N/A;  . DILATION AND CURETTAGE OF UTERUS     Lysis of Adhesions  . ROBOTIC ASSISTED LAPAROSCOPIC LYSIS OF ADHESION  07/23/2012   Procedure: ROBOTIC ASSISTED LAPAROSCOPIC LYSIS OF ADHESION;  Surgeon: Marvene Staff, MD;  Location: [facility];  Service: Gynecology;  Laterality: N/A;  . ROBOTIC ASSISTED SALPINGO OOPHERECTOMY  07/23/2012   Procedure: ROBOTIC ASSISTED SALPINGO OOPHERECTOMY;  Surgeon: Marvene Staff, MD;  Location: [facility];  Service: Gynecology;  Laterality: Bilateral;    Family History  Problem Relation Age of Onset  . GER disease Mother   . GER disease Father   . Heart disease Father     father has "heart problems"  . Anemia Father     Social History   Social History  . Marital status: Single    Spouse name: N/A  . Number of children: N/A  . Years of education: N/A   Occupational History  . Not on file.   Social History Main Topics  . Smoking status: Former Research scientist (life sciences)  . Smokeless tobacco: Never Used  . Alcohol use No  . Drug use: No  .  Sexual activity: Not on file   Other Topics Concern  . Not on file   Social History Narrative  . No narrative on file    Outpatient Medications Prior to Visit  Medication Sig Dispense Refill  . aspirin 81 MG tablet Take 81 mg by mouth daily.    Marland Kitchen atorvastatin (LIPITOR) 20 MG tablet Take 1 tablet (20 mg total) by mouth daily. 90 tablet 3  . Blood Glucose Monitoring Suppl (TRUETRACK BLOOD GLUCOSE) W/DEVICE KIT by Does not apply route.      . carvedilol (COREG) 3.125 MG tablet TAKE 1 TABLET BY  MOUTH 2 TIMES DAILY WITH A MEAL 60 tablet 6  . glimepiride (AMARYL) 4 MG tablet Take 1 tablet (4 mg total) by mouth 2 (two) times daily. 60 tablet 6  . hydrochlorothiazide (HYDRODIURIL) 25 MG tablet Take 1 tablet (25 mg total) by mouth daily. 90 tablet 5  . INVOKANA 100 MG TABS tablet TAKE 1 TABLET (100 MG TOTAL) BY MOUTH DAILY. 30 tablet 5  . Lancet Device MISC by Does not apply route. Use to test blood sugar three times daily     . liraglutide (VICTOZA) 18 MG/3ML SOPN Inject 0.3 mLs (1.8 mg total) into the skin daily. 6 mL 6  . meclizine (ANTIVERT) 25 MG tablet Take 1 tablet (25 mg total) by mouth 3 (three) times daily as needed for dizziness. 30 tablet 0  . metFORMIN (GLUCOPHAGE-XR) 500 MG 24 hr tablet TAKE 2 TABLETS BY MOUTH TWICE DAILY AND 1 TABLET AT NOON 150 tablet 5  . metFORMIN (GLUCOPHAGE-XR) 500 MG 24 hr tablet TAKE 2 TABLETS BY MOUTH TWICE DAILY AND 1 TABLET AT NOON 150 tablet 5  . Multiple Vitamin (MULTIVITAMIN) tablet Take 1 tablet by mouth daily. Reported on 01/12/2016    . naproxen (NAPROSYN) 500 MG tablet Take 1 tablet (500 mg total) by mouth 2 (two) times daily as needed for mild pain or moderate pain (with food). 60 tablet 1  . pantoprazole (PROTONIX) 40 MG tablet TAKE 1 TABLET BY MOUTH DAILY 30 tablet 5  . promethazine (PHENERGAN) 25 MG tablet Take 1 tablet (25 mg total) by mouth every 8 (eight) hours as needed for nausea or vomiting. 20 tablet 0  . sitaGLIPtin (JANUVIA) 100 MG tablet Take 1 tablet (100 mg total) by mouth daily. 30 tablet 6  . TRUETEST TEST test strip USE TO TEST BLOOD SUGAR THREE TIMES A DAY 100 each 11  . albuterol (PROVENTIL HFA;VENTOLIN HFA) 108 (90 Base) MCG/ACT inhaler Inhale 2 puffs into the lungs every 6 (six) hours as needed for wheezing or shortness of breath. 1 Inhaler 6  . cyclobenzaprine (FLEXERIL) 10 MG tablet Take 1 tablet (10 mg total) by mouth 2 (two) times daily as needed for muscle spasms. 20 tablet 0  . nitroGLYCERIN (NITROSTAT) 0.4 MG SL  tablet Place 1 tablet (0.4 mg total) under the tongue every 5 (five) minutes as needed for chest pain. 25 tablet 3  . traMADol (ULTRAM) 50 MG tablet TAKE 2 TABLETS BY MOUTH TWICE DAILY AS NEEDED 60 tablet PRN   No facility-administered medications prior to visit.     No Known Allergies  Review of Systems  Constitutional: Positive for malaise/fatigue. Negative for fever.  HENT: Negative for congestion.   Eyes: Negative for blurred vision.  Respiratory: Negative for cough and shortness of breath.   Cardiovascular: Positive for chest pain and palpitations. Negative for leg swelling.  Gastrointestinal: Negative for vomiting.  Musculoskeletal: Negative for back pain.  Skin: Negative for rash.  Neurological: Negative for loss of consciousness and headaches.       Objective:    Physical Exam  Constitutional: She is oriented to person, place, and time. She appears well-developed and well-nourished. No distress.  HENT:  Head: Normocephalic and atraumatic.  Eyes: Conjunctivae are normal.  Neck: Normal range of motion. No thyromegaly present.  Cardiovascular: Normal rate and regular rhythm.   Pulmonary/Chest: Effort normal and breath sounds normal. She has no wheezes.  Abdominal: Soft. Bowel sounds are normal. There is no tenderness.  Musculoskeletal: She exhibits no edema or deformity.  Neurological: She is alert and oriented to person, place, and time.  Skin: Skin is warm and dry. She is not diaphoretic.  Psychiatric: She has a normal mood and affect.    BP 124/70 (BP Location: Left Arm, Patient Position: Sitting, Cuff Size: Large)   Pulse 71   Temp 97.6 F (36.4 C) (Oral)   Ht 5\' 4"  (1.626 m)   Wt 237 lb 3.2 oz (107.6 kg)   SpO2 100% Comment: RA  BMI 40.72 kg/m  Wt Readings from Last 3 Encounters:  10/31/16 237 lb 3.2 oz (107.6 kg)  09/01/16 235 lb 12.8 oz (107 kg)  08/16/16 240 lb 6 oz (109 kg)     Lab Results  Component Value Date   WBC 10.1 09/01/2016   HGB 14.5  09/01/2016   HCT 43.7 09/01/2016   PLT 433.0 (H) 09/01/2016   GLUCOSE 175 (H) 09/01/2016   CHOL 178 09/01/2016   TRIG 94.0 09/01/2016   HDL 40.40 09/01/2016   LDLDIRECT 123.0 05/12/2016   LDLCALC 119 (H) 09/01/2016   ALT 14 09/01/2016   AST 12 09/01/2016   NA 140 09/01/2016   K 3.5 09/01/2016   CL 101 09/01/2016   CREATININE 0.76 09/01/2016   BUN 15 09/01/2016   CO2 33 (H) 09/01/2016   TSH 1.27 09/01/2016   HGBA1C 7.3 (H) 09/01/2016   MICROALBUR 0.9 11/12/2015    Lab Results  Component Value Date   TSH 1.27 09/01/2016   Lab Results  Component Value Date   WBC 10.1 09/01/2016   HGB 14.5 09/01/2016   HCT 43.7 09/01/2016   MCV 89.7 09/01/2016   PLT 433.0 (H) 09/01/2016   Lab Results  Component Value Date   NA 140 09/01/2016   K 3.5 09/01/2016   CO2 33 (H) 09/01/2016   GLUCOSE 175 (H) 09/01/2016   BUN 15 09/01/2016   CREATININE 0.76 09/01/2016   BILITOT 0.4 09/01/2016   ALKPHOS 90 09/01/2016   AST 12 09/01/2016   ALT 14 09/01/2016   PROT 7.2 09/01/2016   ALBUMIN 4.1 09/01/2016   CALCIUM 9.7 09/01/2016   GFR 102.06 09/01/2016   Lab Results  Component Value Date   CHOL 178 09/01/2016   Lab Results  Component Value Date   HDL 40.40 09/01/2016   Lab Results  Component Value Date   LDLCALC 119 (H) 09/01/2016   Lab Results  Component Value Date   TRIG 94.0 09/01/2016   Lab Results  Component Value Date   CHOLHDL 4 09/01/2016   Lab Results  Component Value Date   HGBA1C 7.3 (H) 09/01/2016       Assessment & Plan:   Problem List Items Addressed This Visit    Diabetes mellitus type 2 in obese (HCC)    hgba1c acceptable, improving, minimize simple carbs. Increase exercise as tolerated. Continue current meds      Relevant Orders   Hemoglobin  A1c   Hyperlipidemia, mixed    Tolerating statin, encouraged heart healthy diet, avoid trans fats, minimize simple carbs and saturated fats. Increase exercise as tolerated      Relevant Medications    nitroGLYCERIN (NITROSTAT) 0.4 MG SL tablet   Other Relevant Orders   Lipid panel   Obesity    Encouraged DASH diet, decrease po intake and increase exercise as tolerated. Needs 7-8 hours of sleep nightly. Avoid trans fats, eat small, frequent meals every 4-5 hours with lean proteins, complex carbs and healthy fats. Minimize simple carbs, given info for Bariatrics and has joined the [employer] and is trying to eat.      Essential hypertension    Well controlled, no changes to meds. Encouraged heart healthy diet such as the DASH diet and exercise as tolerated.       Relevant Medications   nitroGLYCERIN (NITROSTAT) 0.4 MG SL tablet   Other Relevant Orders   CBC   Comprehensive metabolic panel   TSH   Chest pain, atypical    Has recurred in the past month is occurring a couple times a week and lasts a couple of minutes. No associated symptoms. Does not awaken her. Cannot identify a pattern as to when this occurs. Has some expired SL NTG, she has not used them is given a refill, continue daily 81 mg aspirin      Preventative health care - Primary    Patient encouraged to maintain heart healthy diet, regular exercise, adequate sleep. Consider daily probiotics. Take medications as prescribed. Given and reviewed copy of ACP documents from [employer] and encouraged to complete and return.      Relevant Orders   Hemoglobin A1c   CBC   Comprehensive metabolic panel   Lipid panel   TSH    Other Visit Diagnoses    Chest pain, unspecified type       Relevant Medications   nitroGLYCERIN (NITROSTAT) 0.4 MG SL tablet   Other Relevant Orders   EKG 12-Lead (Completed)   Atypical chest pain       Relevant Orders   Ambulatory referral to Cardiology      I am having Ms. Cathy maintain her Lancet Device, TRUETRACK BLOOD GLUCOSE, multivitamin, aspirin, naproxen, pantoprazole, promethazine, metFORMIN, sitaGLIPtin, hydrochlorothiazide, glimepiride, carvedilol, atorvastatin,  TRUETEST TEST, meclizine, liraglutide, metFORMIN, INVOKANA, nitroGLYCERIN, traMADol, cyclobenzaprine, and albuterol.  Meds ordered this encounter  Medications  . DISCONTD: cyclobenzaprine (FLEXERIL) 10 MG tablet    Sig: Take 1 tablet (10 mg total) by mouth 2 (two) times daily as needed for muscle spasms.    Dispense:  20 tablet    Refill:  0  . DISCONTD: traMADol (ULTRAM) 50 MG tablet    Sig: TAKE 2 TABLETS BY MOUTH TWICE DAILY AS NEEDED    Dispense:  60 tablet    Refill:  PRN  . DISCONTD: albuterol (PROVENTIL HFA;VENTOLIN HFA) 108 (90 Base) MCG/ACT inhaler    Sig: Inhale 2 puffs into the lungs every 6 (six) hours as needed for wheezing or shortness of breath.    Dispense:  1 Inhaler    Refill:  6  . nitroGLYCERIN (NITROSTAT) 0.4 MG SL tablet    Sig: Place 1 tablet (0.4 mg total) under the tongue every 5 (five) minutes as needed for chest pain.    Dispense:  25 tablet    Refill:  3  . traMADol (ULTRAM) 50 MG tablet    Sig: TAKE 2 TABLETS BY MOUTH TWICE DAILY AS NEEDED  Dispense:  60 tablet    Refill:  PRN  . cyclobenzaprine (FLEXERIL) 10 MG tablet    Sig: Take 1 tablet (10 mg total) by mouth 2 (two) times daily as needed for muscle spasms.    Dispense:  20 tablet    Refill:  0  . albuterol (PROVENTIL HFA;VENTOLIN HFA) 108 (90 Base) MCG/ACT inhaler    Sig: Inhale 2 puffs into the lungs every 6 (six) hours as needed for wheezing or shortness of breath.    Dispense:  1 Inhaler    Refill:  6    CMA served as scribe during this visit. History, Physical and Plan performed by medical provider. Documentation and orders reviewed and attested to.  Penni Homans, MD

## 2016-10-31 NOTE — Progress Notes (Signed)
Pre visit review using our clinic review tool, if applicable. No additional management support is needed unless otherwise documented below in the visit note. 

## 2016-10-31 NOTE — Assessment & Plan Note (Signed)
hgba1c acceptable, improving, minimize simple carbs. Increase exercise as tolerated. Continue current meds 

## 2016-10-31 NOTE — Assessment & Plan Note (Signed)
Tolerating statin, encouraged heart healthy diet, avoid trans fats, minimize simple carbs and saturated fats. Increase exercise as tolerated 

## 2016-10-31 NOTE — Patient Instructions (Addendum)
mylanta as needed for chest pain  Preventive Care 40-64 Years, Female Preventive care refers to lifestyle choices and visits with your health care provider that can promote health and wellness. What does preventive care include?  A yearly physical exam. This is also called an annual well check.  Dental exams once or twice a year.  Routine eye exams. Ask your health care provider how often you should have your eyes checked.  Personal lifestyle choices, including:  Daily care of your teeth and gums.  Regular physical activity.  Eating a healthy diet.  Avoiding tobacco and drug use.  Limiting alcohol use.  Practicing safe sex.  Taking low-dose aspirin daily starting at age 23.  Taking vitamin and mineral supplements as recommended by your health care provider. What happens during an annual well check? The services and screenings done by your health care provider during your annual well check will depend on your age, overall health, lifestyle risk factors, and family history of disease. Counseling  Your health care provider may ask you questions about your:  Alcohol use.  Tobacco use.  Drug use.  Emotional well-being.  Home and relationship well-being.  Sexual activity.  Eating habits.  Work and work Statistician.  Method of birth control.  Menstrual cycle.  Pregnancy history. Screening  You may have the following tests or measurements:  Height, weight, and BMI.  Blood pressure.  Lipid and cholesterol levels. These may be checked every 5 years, or more frequently if you are over 55 years old.  Skin check.  Lung cancer screening. You may have this screening every year starting at age 36 if you have a 30-pack-year history of smoking and currently smoke or have quit within the past 15 years.  Fecal occult blood test (FOBT) of the stool. You may have this test every year starting at age 42.  Flexible sigmoidoscopy or colonoscopy. You may have a  sigmoidoscopy every 5 years or a colonoscopy every 10 years starting at age 42.  Hepatitis C blood test.  Hepatitis B blood test.  Sexually transmitted disease (STD) testing.  Diabetes screening. This is done by checking your blood sugar (glucose) after you have not eaten for a while (fasting). You may have this done every 1-3 years.  Mammogram. This may be done every 1-2 years. Talk to your health care provider about when you should start having regular mammograms. This may depend on whether you have a family history of breast cancer.  BRCA-related cancer screening. This may be done if you have a family history of breast, ovarian, tubal, or peritoneal cancers.  Pelvic exam and Pap test. This may be done every 3 years starting at age 64. Starting at age 8, this may be done every 5 years if you have a Pap test in combination with an HPV test.  Bone density scan. This is done to screen for osteoporosis. You may have this scan if you are at high risk for osteoporosis. Discuss your test results, treatment options, and if necessary, the need for more tests with your health care provider. Vaccines  Your health care provider may recommend certain vaccines, such as:  Influenza vaccine. This is recommended every year.  Tetanus, diphtheria, and acellular pertussis (Tdap, Td) vaccine. You may need a Td booster every 10 years.  Varicella vaccine. You may need this if you have not been vaccinated.  Zoster vaccine. You may need this after age 49.  Measles, mumps, and rubella (MMR) vaccine. You may need at least one  dose of MMR if you were born in 1957 or later. You may also need a second dose.  Pneumococcal 13-valent conjugate (PCV13) vaccine. You may need this if you have certain conditions and were not previously vaccinated.  Pneumococcal polysaccharide (PPSV23) vaccine. You may need one or two doses if you smoke cigarettes or if you have certain conditions.  Meningococcal vaccine. You may  need this if you have certain conditions.  Hepatitis A vaccine. You may need this if you have certain conditions or if you travel or work in places where you may be exposed to hepatitis A.  Hepatitis B vaccine. You may need this if you have certain conditions or if you travel or work in places where you may be exposed to hepatitis B.  Haemophilus influenzae type b (Hib) vaccine. You may need this if you have certain conditions. Talk to your health care provider about which screenings and vaccines you need and how often you need them. This information is not intended to replace advice given to you by your health care provider. Make sure you discuss any questions you have with your health care provider. Document Released: 10/02/2015 Document Revised: 05/25/2016 Document Reviewed: 07/07/2015 Elsevier Interactive Patient Education  2017 [employer].

## 2016-11-03 ENCOUNTER — Other Ambulatory Visit: Payer: Self-pay

## 2016-11-07 ENCOUNTER — Other Ambulatory Visit: Payer: Self-pay | Admitting: Family Medicine

## 2016-11-07 MED FILL — INVOKANA 100 MG TABLET: 100 | 90 days supply | Qty: 90 | Fill #1

## 2016-11-07 MED FILL — METFORMIN HCL ER 500 MG TAB: 500 | 30 days supply | Qty: 150 | Fill #2

## 2016-11-07 MED FILL — UNIFINE PENTIPS 31GX3/16": 31G X 5 MM | 90 days supply | Qty: 100 | Fill #2

## 2016-11-07 MED FILL — UNIFINE PENTIPS 31GX3/16: 31G X 5 MM | 90 days supply | Qty: 100 | Fill #2

## 2016-11-07 MED FILL — GLIMEPIRIDE 4 MG TABLET: 4 | 30 days supply | Qty: 60 | Fill #5

## 2016-11-07 MED FILL — CARVEDILOL 3.125 MG TABLET: 3.125 | 90 days supply | Qty: 180 | Fill #1

## 2016-11-07 MED FILL — PANTOPRAZOLE SOD DR 40 MG T: 40 | 90 days supply | Qty: 90 | Fill #0

## 2016-11-07 MED FILL — JANUVIA 100 MG TABLET: 100 | 90 days supply | Qty: 90 | Fill #0

## 2016-11-09 MED FILL — VICTOZA 18 MG/3 ML INJECT P: 18 | 30 days supply | Qty: 9 | Fill #2

## 2016-11-21 ENCOUNTER — Other Ambulatory Visit: Payer: Self-pay | Admitting: *Deleted

## 2016-11-21 NOTE — Patient Outreach (Signed)
Eileen Richards called to request re-onboarding into [facility] as she purchased a new phone and needs her [place] devices paired to her new phone. Advised Eileen Richards that [facility] case management assistant will contact her to schedule the appointment. Barrington Ellison RN,CCM,CDE [place] Management Coordinator [facility] Office Phone [phone] Office Fax [phone]

## 2016-11-23 NOTE — Progress Notes (Signed)
Cardiology Office Note    Date:  11/24/2016   ID:  Eileen Richards, DOB March 06, 1961, MRN [id number]  PCP:  Penni Homans, MD  Cardiologist:  Dr. Johnsie Cancel (seen back in 2014)  CC: chest pain  History of Present Illness:  Eileen Richards is a 56 y.o. female with a history of HLD, HTN, DMT2, and obesity who presents to clinic for evaluation of chest pain.   She was seen by Dr. Johnsie Cancel in 08/2013 by Dr. Johnsie Cancel for evaluation of chest pain. Chest pain felt to be atypical, but given multiple RFs for CAD a GXT was ordered and returned normal.   She was recently seen by her PCP, Dr. Charlett Blake on 10/31/16 for follow up. She complained of chest pain and was referred back to [place].   Today she presents to clinic for follow up. She has a left sided tingling pain. Not pressure, tightness or burning. It comes on sporadically with no pattern. Not related to exertion. It lasts only seconds. No associated SOB, nausea or diaphoresis. No radiation. 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 Sometimes she feels SOB when she walks in from the parking lot at work. Sometimes gets some swelling in her legs from time to time. None right now. No orthopnea or PND. She sometimes she gets a little dizzy but never syncope. Used to smoke but hasn't for a long time. No alcohol use. She doesn't know of any strong family history of CAD.    Past Medical History:  Diagnosis Date  . Achilles tendonitis 10/14/2012  . Asthma    childhood  . Atypical chest pain    normal stress echo 02/2009 -LV size was normal LV global systolic function was normal- Normal wall motion; no LV regional wall motion abnormalities  . Benign paroxysmal positional vertigo 04/26/2015  . Chest pain, atypical 08/24/2013  . Diabetes mellitus   . GERD (gastroesophageal reflux disease)   . Hand pain, left 11/10/2015  . Hyperlipemia   . Hypertension   . Onychomycosis 04/17/2014  . Pain of right heel 10/14/2012  . Preventative health care  11/22/2015  . Sinusitis, acute 01/17/2016  . Thoracic outlet syndrome 11/03/2014  . Urinary urgency 12/15/2015    Past Surgical History:  Procedure Laterality Date  . ABDOMINAL HYSTERECTOMY    . CYSTOSCOPY  07/23/2012   Procedure: CYSTOSCOPY;  Surgeon: Marvene Staff, MD;  Location: [facility];  Service: Gynecology;  Laterality: N/A;  . DILATION AND CURETTAGE OF UTERUS     Lysis of Adhesions  . ROBOTIC ASSISTED LAPAROSCOPIC LYSIS OF ADHESION  07/23/2012   Procedure: ROBOTIC ASSISTED LAPAROSCOPIC LYSIS OF ADHESION;  Surgeon: Marvene Staff, MD;  Location: [facility];  Service: Gynecology;  Laterality: N/A;  . ROBOTIC ASSISTED SALPINGO OOPHERECTOMY  07/23/2012   Procedure: ROBOTIC ASSISTED SALPINGO OOPHERECTOMY;  Surgeon: Marvene Staff, MD;  Location: [facility];  Service: Gynecology;  Laterality: Bilateral;    Current Medications: Outpatient Medications Prior to Visit  Medication Sig Dispense Refill  . albuterol (PROVENTIL HFA;VENTOLIN HFA) 108 (90 Base) MCG/ACT inhaler Inhale 2 puffs into the lungs every 6 (six) hours as needed for wheezing or shortness of breath. 1 Inhaler 6  . aspirin 81 MG tablet Take 81 mg by mouth daily.    Marland Kitchen atorvastatin (LIPITOR) 20 MG tablet Take 1 tablet (20 mg total) by mouth daily. 90 tablet 3  . Blood Glucose Monitoring Suppl (TRUETRACK BLOOD GLUCOSE) W/DEVICE KIT by Does not apply route.      Marland Kitchen  carvedilol (COREG) 3.125 MG tablet TAKE 1 TABLET BY MOUTH 2 TIMES DAILY WITH A MEAL 60 tablet 6  . cyclobenzaprine (FLEXERIL) 10 MG tablet Take 1 tablet (10 mg total) by mouth 2 (two) times daily as needed for muscle spasms. 20 tablet 0  . glimepiride (AMARYL) 4 MG tablet Take 1 tablet (4 mg total) by mouth 2 (two) times daily. 60 tablet 6  . hydrochlorothiazide (HYDRODIURIL) 25 MG tablet Take 1 tablet (25 mg total) by mouth daily. 90 tablet 5  . INVOKANA 100 MG TABS tablet TAKE 1 TABLET (100 MG TOTAL) BY MOUTH DAILY. 30 tablet 5  . Lancet Device MISC by Does not  apply route. Use to test blood sugar three times daily     . liraglutide (VICTOZA) 18 MG/3ML SOPN Inject 0.3 mLs (1.8 mg total) into the skin daily. 6 mL 6  . meclizine (ANTIVERT) 25 MG tablet Take 1 tablet (25 mg total) by mouth 3 (three) times daily as needed for dizziness. 30 tablet 0  . metFORMIN (GLUCOPHAGE-XR) 500 MG 24 hr tablet TAKE 2 TABLETS BY MOUTH TWICE DAILY AND 1 TABLET AT NOON 150 tablet 5  . metFORMIN (GLUCOPHAGE-XR) 500 MG 24 hr tablet TAKE 2 TABLETS BY MOUTH TWICE DAILY AND 1 TABLET AT NOON 150 tablet 5  . Multiple Vitamin (MULTIVITAMIN) tablet Take 1 tablet by mouth daily. Reported on 01/12/2016    . naproxen (NAPROSYN) 500 MG tablet Take 1 tablet (500 mg total) by mouth 2 (two) times daily as needed for mild pain or moderate pain (with food). 60 tablet 1  . nitroGLYCERIN (NITROSTAT) 0.4 MG SL tablet Place 1 tablet (0.4 mg total) under the tongue every 5 (five) minutes as needed for chest pain. 25 tablet 3  . pantoprazole (PROTONIX) 40 MG tablet TAKE 1 TABLET BY MOUTH ONCE DAILY 30 tablet 5  . promethazine (PHENERGAN) 25 MG tablet Take 1 tablet (25 mg total) by mouth every 8 (eight) hours as needed for nausea or vomiting. 20 tablet 0  . sitaGLIPtin (JANUVIA) 100 MG tablet Take 1 tablet (100 mg total) by mouth daily. 30 tablet 6  . traMADol (ULTRAM) 50 MG tablet TAKE 2 TABLETS BY MOUTH TWICE DAILY AS NEEDED 60 tablet PRN  . TRUETEST TEST test strip USE TO TEST BLOOD SUGAR THREE TIMES A DAY 100 each 11   No facility-administered medications prior to visit.      Allergies:   Patient has no known allergies.   Social History   Social History  . Marital status: Single    Spouse name: N/A  . Number of children: N/A  . Years of education: N/A   Social History Main Topics  . Smoking status: Former Research scientist (life sciences)  . Smokeless tobacco: Never Used  . Alcohol use No  . Drug use: No  . Sexual activity: Not Asked   Other Topics Concern  . None   Social History Narrative  . None      Family History:  The patient's family history includes Anemia in her father; GER disease in her father and mother; Heart disease in her father.      ROS:   Please see the history of present illness.    ROS All other systems reviewed and are negative.   PHYSICAL EXAM:   VS:  BP 116/80   Pulse 76   Ht _0  (1.702 m)   Wt 236 lb 12.8 oz (107.4 kg)   SpO2 99%   BMI 37.09 kg/m  GEN: Well nourished, well developed, in no acute distress, obese HEENT: normal  Neck: no JVD, carotid bruits, or masses Cardiac: RRR; no murmurs, rubs, or gallops,no edema  Respiratory:  clear to auscultation bilaterally, normal work of breathing GI: soft, nontender, nondistended, + BS MS: no deformity or atrophy  Skin: warm and dry, no rash Neuro:  Alert and Oriented x 3, Strength and sensation are intact Psych: euthymic mood, full affect    Wt Readings from Last 3 Encounters:  11/24/16 236 lb 12.8 oz (107.4 kg)  10/31/16 237 lb 3.2 oz (107.6 kg)  09/01/16 235 lb 12.8 oz (107 kg)      Studies/Labs Reviewed:   EKG:  EKG is NOT ordered today.    Recent Labs: 09/01/2016: ALT 14; BUN 15; Creatinine, Ser 0.76; Hemoglobin 14.5; Platelets 433.0; Potassium 3.5; Sodium 140; TSH 1.27   Lipid Panel    Component Value Date/Time   CHOL 178 09/01/2016 1156   TRIG 94.0 09/01/2016 1156   HDL 40.40 09/01/2016 1156   CHOLHDL 4 09/01/2016 1156   VLDL 18.8 09/01/2016 1156   LDLCALC 119 (H) 09/01/2016 1156   LDLDIRECT 123.0 05/12/2016 1654    Additional studies/ records that were reviewed today include:  09/10/13 GXT Summary: Resting ECG: Normal. Functional Capacity: Slightly Decreased (20% to 30%). HR Response to Exercise: Normal Overall HR Response To Exercise. BP Response to Exercise: Normal Resting BP with Appropriate Response. Chest Pain: No Chest Pain. Arrhythmias: No Arrhythmias. ST Changes: No Changes From Baseline. Conclusions Normal stress test   ASSESSMENT & PLAN:   Chest pain:  atypical but does have RFs for CAD (obesity, DM, HLD, HTN) so we will do a exercise myoview. Will have her hold DM meds and Coreg the AM of the stress test.   HTN: BP well controlled today   HLD: LDL 123. Continue atorvastatin 12m daily. Followed by Dr. BCharlett Blake  DMT2: most recent HgA1c 7.3. Followed by Dr. BCharlett Blake  Medication Adjustments/Labs and Tests Ordered: Current medicines are reviewed at length with the patient today.  Concerns regarding medicines are outlined above.  Medication changes, Labs and Tests ordered today are listed in the Patient Instructions below. Patient Instructions  Medication Instructions:  Your physician recommends that you continue on your current medications as directed. Please refer to the Current Medication list given to you today.   Labwork: None ordered  Testing/Procedures: Your physician has requested that you have en exercise stress myoview. For further information please visit [email or web address] Please follow instruction sheet, as given.   Follow-Up: Your physician recommends that you schedule a follow-up appointment in: PENDING TEST RESULTS   Any Other Special Instructions Will Be Listed Below (If Applicable).   Exercise Stress Electrocardiogram An exercise stress electrocardiogram is a test to check how blood flows to your heart. It is done to find areas of poor blood flow. You will need to walk on a treadmill for this test. The electrocardiogram will record your heartbeat when you are at rest and when you are exercising. What happens before the procedure?  Do not have drinks with caffeine or foods with caffeine for 24 hours before the test, or as told by your doctor. This includes coffee, tea (even decaf tea), sodas, chocolate, and cocoa.  Follow your doctor's instructions about eating and drinking before the test.  Ask your doctor what medicines you should or should not take before the test. Take your medicines with water unless told by  your doctor not to.  If you  use an inhaler, bring it with you to the test.  Bring a snack to eat after the test.  Do not  smoke for 4 hours before the test.  Do not put lotions, powders, creams, or oils on your chest before the test.  Wear comfortable shoes and clothing. What happens during the procedure?  You will have patches put on your chest. Small areas of your chest may need to be shaved. Wires will be connected to the patches.  Your heart rate will be watched while you are resting and while you are exercising.  You will walk on the treadmill. The treadmill will slowly get faster to raise your heart rate.  The test will take about 1-2 hours. What happens after the procedure?  Your heart rate and blood pressure will be watched after the test.  You may return to your normal diet, activities, and medicines or as told by your doctor. This information is not intended to replace advice given to you by your health care provider. Make sure you discuss any questions you have with your health care provider. Document Released: 02/22/2008 Document Revised: 05/04/2016 Document Reviewed: 05/13/2013 Elsevier Interactive Patient Education  2017 [employer].    If you need a refill on your cardiac medications before your next appointment, please call your pharmacy.      Signed, Angelena Form, PA-C  11/24/2016 9:30 AM    [facility] [place], [place], [place]  [address] Phone: [phone]; Fax: [phone]

## 2016-11-24 ENCOUNTER — Ambulatory Visit (INDEPENDENT_AMBULATORY_CARE_PROVIDER_SITE_OTHER): Payer: 59 | Admitting: Physician Assistant

## 2016-11-24 ENCOUNTER — Encounter: Payer: Self-pay | Admitting: Physician Assistant

## 2016-11-24 VITALS — BP 116/80 | HR 76 | Ht 67.0 in | Wt 236.8 lb

## 2016-11-24 DIAGNOSIS — E118 Type 2 diabetes mellitus with unspecified complications: Secondary | ICD-10-CM | POA: Diagnosis not present

## 2016-11-24 DIAGNOSIS — E785 Hyperlipidemia, unspecified: Secondary | ICD-10-CM

## 2016-11-24 DIAGNOSIS — R079 Chest pain, unspecified: Secondary | ICD-10-CM | POA: Diagnosis not present

## 2016-11-24 DIAGNOSIS — I10 Essential (primary) hypertension: Secondary | ICD-10-CM

## 2016-11-24 NOTE — Patient Instructions (Signed)
Medication Instructions:  Your physician recommends that you continue on your current medications as directed. Please refer to the Current Medication list given to you today.   Labwork: None ordered  Testing/Procedures: Your physician has requested that you have en exercise stress myoview. For further information please visit [email or web address]. Please follow instruction sheet, as given.   Follow-Up: Your physician recommends that you schedule a follow-up appointment in: PENDING TEST RESULTS   Any Other Special Instructions Will Be Listed Below (If Applicable).   Exercise Stress Electrocardiogram An exercise stress electrocardiogram is a test to check how blood flows to your heart. It is done to find areas of poor blood flow. You will need to walk on a treadmill for this test. The electrocardiogram will record your heartbeat when you are at rest and when you are exercising. What happens before the procedure?  Do not have drinks with caffeine or foods with caffeine for 24 hours before the test, or as told by your doctor. This includes coffee, tea (even decaf tea), sodas, chocolate, and cocoa.  Follow your doctor's instructions about eating and drinking before the test.  Ask your doctor what medicines you should or should not take before the test. Take your medicines with water unless told by your doctor not to.  If you use an inhaler, bring it with you to the test.  Bring a snack to eat after the test.  Do not  smoke for 4 hours before the test.  Do not put lotions, powders, creams, or oils on your chest before the test.  Wear comfortable shoes and clothing. What happens during the procedure?  You will have patches put on your chest. Small areas of your chest may need to be shaved. Wires will be connected to the patches.  Your heart rate will be watched while you are resting and while you are exercising.  You will walk on the treadmill. The treadmill will slowly get faster  to raise your heart rate.  The test will take about 1-2 hours. What happens after the procedure?  Your heart rate and blood pressure will be watched after the test.  You may return to your normal diet, activities, and medicines or as told by your doctor. This information is not intended to replace advice given to you by your health care provider. Make sure you discuss any questions you have with your health care provider. Document Released: 02/22/2008 Document Revised: 05/04/2016 Document Reviewed: 05/13/2013 Elsevier Interactive Patient Education  2017 [employer].    If you need a refill on your cardiac medications before your next appointment, please call your pharmacy.

## 2016-11-29 ENCOUNTER — Telehealth (HOSPITAL_COMMUNITY): Payer: Self-pay

## 2016-12-01 ENCOUNTER — Ambulatory Visit (HOSPITAL_COMMUNITY): Payer: 59 | Attending: Cardiovascular Disease

## 2016-12-01 DIAGNOSIS — R079 Chest pain, unspecified: Secondary | ICD-10-CM | POA: Diagnosis not present

## 2016-12-01 MED ORDER — TECHNETIUM TC 99M TETROFOSMIN IV KIT
32.4000 | PACK | Freq: Once | INTRAVENOUS | Status: AC | PRN
  Administered 2016-12-01: 32.4 via INTRAVENOUS
  Filled 2016-12-01: qty 33

## 2016-12-02 ENCOUNTER — Ambulatory Visit (HOSPITAL_COMMUNITY): Payer: 59 | Attending: Cardiology

## 2016-12-02 LAB — MYOCARDIAL PERFUSION IMAGING
Estimated workload: 9.3 METS
Exercise duration (min): 7 min
Exercise duration (sec): 30 s
LV dias vol: 77 mL (ref 46–106)
LV sys vol: 29 mL
MPHR: 166 {beats}/min
Peak HR: 150 {beats}/min
Percent HR: 90 %
RATE: 0.28
Rest HR: 78 {beats}/min
SDS: 1
SRS: 2
SSS: 3
TID: 0.91

## 2016-12-02 MED ORDER — TECHNETIUM TC 99M TETROFOSMIN IV KIT
31.2000 | PACK | Freq: Once | INTRAVENOUS | Status: AC | PRN
  Administered 2016-12-02: 31.2 via INTRAVENOUS
  Filled 2016-12-02: qty 32

## 2016-12-20 ENCOUNTER — Other Ambulatory Visit: Payer: Self-pay | Admitting: Family Medicine

## 2016-12-20 MED ORDER — ACCU-CHEK GUIDE W/DEVICE KIT: 1.0000 | PACK | Freq: Two times a day (BID) | 0 refills | Status: DC

## 2016-12-20 MED ORDER — GLUCOSE BLOOD VI STRP
ORAL_STRIP | 6 refills | Status: DC
Start: 2016-12-20 — End: 2018-02-01

## 2016-12-20 MED ORDER — ACCU-CHEK FASTCLIX LANCETS MISC
6 refills | Status: DC
Start: 2016-12-20 — End: 2018-02-01

## 2016-12-20 MED FILL — VICTOZA 18 MG/3 ML INJECT P: 18 | 30 days supply | Qty: 9 | Fill #3

## 2016-12-20 MED FILL — ACCU-CHEK FASTCLIX LANCETS: 51 days supply | Qty: 102 | Fill #0

## 2016-12-20 MED FILL — METFORMIN HCL ER 500 MG TAB: 500 | 30 days supply | Qty: 150 | Fill #3

## 2016-12-20 MED FILL — ACCU-CHEK GUIDE TEST STRIP: 25 days supply | Qty: 100 | Fill #0

## 2017-01-12 ENCOUNTER — Other Ambulatory Visit: Payer: 59

## 2017-01-16 MED FILL — GLIMEPIRIDE 4 MG TABLET: 4 | 30 days supply | Qty: 60 | Fill #6

## 2017-01-19 ENCOUNTER — Ambulatory Visit (INDEPENDENT_AMBULATORY_CARE_PROVIDER_SITE_OTHER): Payer: 59 | Admitting: Family Medicine

## 2017-01-19 DIAGNOSIS — E1169 Type 2 diabetes mellitus with other specified complication: Secondary | ICD-10-CM

## 2017-01-19 DIAGNOSIS — M5441 Lumbago with sciatica, right side: Secondary | ICD-10-CM | POA: Diagnosis not present

## 2017-01-19 DIAGNOSIS — I10 Essential (primary) hypertension: Secondary | ICD-10-CM

## 2017-01-19 DIAGNOSIS — E6609 Other obesity due to excess calories: Secondary | ICD-10-CM

## 2017-01-19 DIAGNOSIS — K219 Gastro-esophageal reflux disease without esophagitis: Secondary | ICD-10-CM | POA: Diagnosis not present

## 2017-01-19 DIAGNOSIS — E669 Obesity, unspecified: Secondary | ICD-10-CM | POA: Diagnosis not present

## 2017-01-19 DIAGNOSIS — E782 Mixed hyperlipidemia: Secondary | ICD-10-CM

## 2017-01-19 MED ORDER — TIZANIDINE HCL 4 MG PO TABS: 4.0000 mg | ORAL_TABLET | Freq: Four times a day (QID) | ORAL | 1 refills | Status: DC | PRN

## 2017-01-19 MED ORDER — TRAMADOL HCL 50 MG PO TABS: ORAL_TABLET | ORAL | 99 refills | Status: DC

## 2017-01-19 MED FILL — tiZANidine HCL 4 MG TABS: 4 | 10 days supply | Qty: 40 | Fill #0

## 2017-01-19 NOTE — Assessment & Plan Note (Signed)
Encouraged DASH diet, decrease po intake and increase exercise as tolerated. Needs 7-8 hours of sleep nightly. Avoid trans fats, eat small, frequent meals every 4-5 hours with lean proteins, complex carbs and healthy fats. Minimize simple carbs, referred to bariatric

## 2017-01-19 NOTE — Assessment & Plan Note (Signed)
Well controlled, no changes to meds. Encouraged heart healthy diet such as the DASH diet and exercise as tolerated.  °

## 2017-01-19 NOTE — Progress Notes (Signed)
Pre visit review using our clinic review tool, if applicable. No additional management support is needed unless otherwise documented below in the visit note. 

## 2017-01-19 NOTE — Patient Instructions (Signed)
Carbohydrate Counting for Diabetes Mellitus, Adult Carbohydrate counting is a method for keeping track of how many carbohydrates you eat. Eating carbohydrates naturally increases the amount of sugar (glucose) in the blood. Counting how many carbohydrates you eat helps keep your blood glucose within normal limits, which helps you manage your diabetes (diabetes mellitus). It is important to know how many carbohydrates you can safely have in each meal. This is different for every person. A diet and nutrition specialist (registered dietitian) can help you make a meal plan and calculate how many carbohydrates you should have at each meal and snack. Carbohydrates are found in the following foods:  Grains, such as breads and cereals.  Dried beans and soy products.  Starchy vegetables, such as potatoes, peas, and corn.  Fruit and fruit juices.  Milk and yogurt.  Sweets and snack foods, such as cake, cookies, candy, chips, and soft drinks. How do I count carbohydrates? There are two ways to count carbohydrates in food. You can use either of the methods or a combination of both. Reading "Nutrition Facts" on packaged food  The "Nutrition Facts" list is included on the labels of almost all packaged foods and beverages in the [place] It includes:  The serving size.  Information about nutrients in each serving, including the grams (g) of carbohydrate per serving. To use the "Nutrition Facts":  Decide how many servings you will have.  Multiply the number of servings by the number of carbohydrates per serving.  The resulting number is the total amount of carbohydrates that you will be having. Learning standard serving sizes of other foods  When you eat foods containing carbohydrates that are not packaged or do not include "Nutrition Facts" on the label, you need to measure the servings in order to count the amount of carbohydrates:  Measure the foods that you will eat with a food scale or measuring  cup, if needed.  Decide how many standard-size servings you will eat.  Multiply the number of servings by 15. Most carbohydrate-rich foods have about 15 g of carbohydrates per serving.  For example, if you eat 8 oz (170 g) of strawberries, you will have eaten 2 servings and 30 g of carbohydrates (2 servings x 15 g = 30 g).  For foods that have more than one food mixed, such as soups and casseroles, you must count the carbohydrates in each food that is included. The following list contains standard serving sizes of common carbohydrate-rich foods. Each of these servings has about 15 g of carbohydrates:   hamburger bun or  English muffin.   oz (15 mL) syrup.   oz (14 g) jelly.  1 slice of bread.  1 six-inch tortilla.  3 oz (85 g) cooked rice or pasta.  4 oz (113 g) cooked dried beans.  4 oz (113 g) starchy vegetable, such as peas, corn, or potatoes.  4 oz (113 g) hot cereal.  4 oz (113 g) mashed potatoes or  of a large baked potato.  4 oz (113 g) canned or frozen fruit.  4 oz (120 mL) fruit juice.  4-6 crackers.  6 chicken nuggets.  6 oz (170 g) unsweetened dry cereal.  6 oz (170 g) plain fat-free yogurt or yogurt sweetened with artificial sweeteners.  8 oz (240 mL) milk.  8 oz (170 g) fresh fruit or one small piece of fruit.  24 oz (680 g) popped popcorn. Example of carbohydrate counting Sample meal  3 oz (85 g) chicken breast.  6 oz (  170 g) brown rice.  4 oz (113 g) corn.  8 oz (240 mL) milk.  8 oz (170 g) strawberries with sugar-free whipped topping. Carbohydrate calculation 1. Identify the foods that contain carbohydrates:  Rice.  Corn.  Milk.  Strawberries. 2. Calculate how many servings you have of each food:  2 servings rice.  1 serving corn.  1 serving milk.  1 serving strawberries. 3. Multiply each number of servings by 15 g:  2 servings rice x 15 g = 30 g.  1 serving corn x 15 g = 15 g.  1 serving milk x 15 g = 15  g.  1 serving strawberries x 15 g = 15 g. 4. Add together all of the amounts to find the total grams of carbohydrates eaten:  30 g + 15 g + 15 g + 15 g = 75 g of carbohydrates total. This information is not intended to replace advice given to you by your health care provider. Make sure you discuss any questions you have with your health care provider. Document Released: 09/05/2005 Document Revised: 03/25/2016 Document Reviewed: 02/17/2016 Elsevier Interactive Patient Education  2017 Elsevier Inc.  

## 2017-01-19 NOTE — Assessment & Plan Note (Signed)
Tolerating statin, encouraged heart healthy diet, avoid trans fats, minimize simple carbs and saturated fats. Increase exercise as tolerated 

## 2017-01-19 NOTE — Assessment & Plan Note (Signed)
hgba1c acceptable, minimize simple carbs. Increase exercise as tolerated.  

## 2017-01-20 LAB — LIPID PANEL
Cholesterol: 157 mg/dL (ref 0–200)
HDL: 42.6 mg/dL (ref >39.00)
LDL Cholesterol: 94 mg/dL (ref 0–99)
NonHDL: 113.9
Total CHOL/HDL Ratio: 4
Triglycerides: 102 mg/dL (ref 0.0–149.0)
VLDL: 20.4 mg/dL (ref 0.0–40.0)

## 2017-01-20 LAB — CBC
HCT: 42 % (ref 36.0–46.0)
Hemoglobin: 13.8 g/dL (ref 12.0–15.0)
MCHC: 32.7 g/dL (ref 30.0–36.0)
MCV: 90.5 fl (ref 78.0–100.0)
Platelets: 408 10*3/uL — ABNORMAL HIGH (ref 150.0–400.0)
RBC: 4.65 Mil/uL (ref 3.87–5.11)
RDW: 14.2 % (ref 11.5–15.5)
WBC: 11.4 10*3/uL — ABNORMAL HIGH (ref 4.0–10.5)

## 2017-01-20 LAB — COMPREHENSIVE METABOLIC PANEL
ALT: 19 U/L (ref 0–35)
AST: 19 U/L (ref 0–37)
Albumin: 3.9 g/dL (ref 3.5–5.2)
Alkaline Phosphatase: 78 U/L (ref 39–117)
BUN: 14 mg/dL (ref 6–23)
CO2: 30 mEq/L (ref 19–32)
Calcium: 9.5 mg/dL (ref 8.4–10.5)
Chloride: 102 mEq/L (ref 96–112)
Creatinine, Ser: 0.83 mg/dL (ref 0.40–1.20)
GFR: 92.06 mL/min (ref >60.00)
Glucose, Bld: 154 mg/dL — ABNORMAL HIGH (ref 70–99)
Potassium: 3.2 mEq/L — ABNORMAL LOW (ref 3.5–5.1)
Sodium: 140 mEq/L (ref 135–145)
Total Bilirubin: 0.3 mg/dL (ref 0.2–1.2)
Total Protein: 7 g/dL (ref 6.0–8.3)

## 2017-01-20 LAB — TSH: TSH: 1.08 u[IU]/mL (ref 0.35–4.50)

## 2017-01-20 LAB — HEMOGLOBIN A1C: Hgb A1c MFr Bld: 7.5 % — ABNORMAL HIGH (ref 4.6–6.5)

## 2017-01-20 MED FILL — traMADol HCL 50 MG TABS: 50 | 15 days supply | Qty: 60 | Fill #0

## 2017-01-22 NOTE — Assessment & Plan Note (Signed)
Encouraged moist heat and gentle stretching as tolerated. May try NSAIDs and prescription meds as directed and report if symptoms worsen or seek immediate care. Switch to Tizanidine from Cyclobenzaprine due to insurance company demand. May continue to use Tramadol prn with relief

## 2017-01-22 NOTE — Assessment & Plan Note (Signed)
Avoid offending foods, consider probiotics. Do not eat large meals in late evening and consider raising head of bed.

## 2017-01-22 NOTE — Progress Notes (Signed)
Patient ID: Eileen Richards, female   DOB: 16-Feb-1961, 56 y.o.   MRN: [id number]   Subjective:    Patient ID: Eileen Richards, female    DOB: May 04, 1961, 56 y.o.   MRN: [id number]  No chief complaint on file.   HPI Patient is in today for follow up. She is feeling well today. Her major ongoing complaint is low back pain. Gets marginal relief with Tramadol. Has gotten relief with Flexeril but insurance is asking her to switch. No recent febrile illness or hospitalizations. She is still interacting with her [facility] app and is pleased with it. Denies CP/palp/SOB/HA/congestion/fevers/GI or GU c/o. Taking meds as prescribed  Past Medical History:  Diagnosis Date  . Achilles tendonitis 10/14/2012  . Asthma    childhood  . Atypical chest pain    normal stress echo 02/2009 -LV size was normal LV global systolic function was normal- Normal wall motion; no LV regional wall motion abnormalities  . Benign paroxysmal positional vertigo 04/26/2015  . Chest pain, atypical 08/24/2013  . Diabetes mellitus   . GERD (gastroesophageal reflux disease)   . Hand pain, left 11/10/2015  . Hyperlipemia   . Hypertension   . Onychomycosis 04/17/2014  . Pain of right heel 10/14/2012  . Preventative health care 11/22/2015  . Sinusitis, acute 01/17/2016  . Thoracic outlet syndrome 11/03/2014  . Urinary urgency 12/15/2015    Past Surgical History:  Procedure Laterality Date  . ABDOMINAL HYSTERECTOMY    . CYSTOSCOPY  07/23/2012   Procedure: CYSTOSCOPY;  Surgeon: Marvene Staff, MD;  Location: [facility];  Service: Gynecology;  Laterality: N/A;  . DILATION AND CURETTAGE OF UTERUS     Lysis of Adhesions  . ROBOTIC ASSISTED LAPAROSCOPIC LYSIS OF ADHESION  07/23/2012   Procedure: ROBOTIC ASSISTED LAPAROSCOPIC LYSIS OF ADHESION;  Surgeon: Marvene Staff, MD;  Location: [facility];  Service: Gynecology;  Laterality: N/A;  . ROBOTIC ASSISTED SALPINGO OOPHERECTOMY  07/23/2012   Procedure: ROBOTIC ASSISTED SALPINGO  OOPHERECTOMY;  Surgeon: Marvene Staff, MD;  Location: [facility];  Service: Gynecology;  Laterality: Bilateral;    Family History  Problem Relation Age of Onset  . GER disease Mother   . GER disease Father   . Heart disease Father     father has "heart problems"  . Anemia Father     Social History   Social History  . Marital status: Single    Spouse name: N/A  . Number of children: N/A  . Years of education: N/A   Occupational History  . Not on file.   Social History Main Topics  . Smoking status: Former Research scientist (life sciences)  . Smokeless tobacco: Never Used  . Alcohol use No  . Drug use: No  . Sexual activity: Not on file   Other Topics Concern  . Not on file   Social History Narrative  . No narrative on file    Outpatient Medications Prior to Visit  Medication Sig Dispense Refill  . ACCU-CHEK FASTCLIX LANCETS MISC Use as directed twice daily to check blood sugar.  DX E11.9 102 each 6  . albuterol (PROVENTIL HFA;VENTOLIN HFA) 108 (90 Base) MCG/ACT inhaler Inhale 2 puffs into the lungs every 6 (six) hours as needed for wheezing or shortness of breath. 1 Inhaler 6  . aspirin 81 MG tablet Take 81 mg by mouth daily.    Marland Kitchen atorvastatin (LIPITOR) 20 MG tablet Take 1 tablet (20 mg total) by mouth daily. 90 tablet 3  . Blood Glucose Monitoring Suppl (ACCU-CHEK GUIDE)  w/Device KIT Inject 1 kit as directed 2 (two) times daily. Use to check blood sugar.  DX E11.9 1 kit 0  . carvedilol (COREG) 3.125 MG tablet TAKE 1 TABLET BY MOUTH 2 TIMES DAILY WITH A MEAL 60 tablet 6  . cyclobenzaprine (FLEXERIL) 10 MG tablet Take 1 tablet (10 mg total) by mouth 2 (two) times daily as needed for muscle spasms. 20 tablet 0  . glimepiride (AMARYL) 4 MG tablet Take 1 tablet (4 mg total) by mouth 2 (two) times daily. 60 tablet 6  . glucose blood (ACCU-CHEK GUIDE) test strip Use as directed twice daily to check blood sugar.  DX E11.9 100 each 6  . hydrochlorothiazide (HYDRODIURIL) 25 MG tablet Take 1 tablet (25  mg total) by mouth daily. 90 tablet 5  . INVOKANA 100 MG TABS tablet TAKE 1 TABLET (100 MG TOTAL) BY MOUTH DAILY. 30 tablet 5  . Lancet Device MISC by Does not apply route. Use to test blood sugar three times daily     . liraglutide (VICTOZA) 18 MG/3ML SOPN Inject 0.3 mLs (1.8 mg total) into the skin daily. 6 mL 6  . meclizine (ANTIVERT) 25 MG tablet Take 1 tablet (25 mg total) by mouth 3 (three) times daily as needed for dizziness. 30 tablet 0  . metFORMIN (GLUCOPHAGE-XR) 500 MG 24 hr tablet TAKE 2 TABLETS BY MOUTH TWICE DAILY AND 1 TABLET AT NOON 150 tablet 5  . metFORMIN (GLUCOPHAGE-XR) 500 MG 24 hr tablet TAKE 2 TABLETS BY MOUTH TWICE DAILY AND 1 TABLET AT NOON 150 tablet 5  . Multiple Vitamin (MULTIVITAMIN) tablet Take 1 tablet by mouth daily. Reported on 01/12/2016    . naproxen (NAPROSYN) 500 MG tablet Take 1 tablet (500 mg total) by mouth 2 (two) times daily as needed for mild pain or moderate pain (with food). 60 tablet 1  . nitroGLYCERIN (NITROSTAT) 0.4 MG SL tablet Place 1 tablet (0.4 mg total) under the tongue every 5 (five) minutes as needed for chest pain. 25 tablet 3  . pantoprazole (PROTONIX) 40 MG tablet TAKE 1 TABLET BY MOUTH ONCE DAILY 30 tablet 5  . promethazine (PHENERGAN) 25 MG tablet Take 1 tablet (25 mg total) by mouth every 8 (eight) hours as needed for nausea or vomiting. 20 tablet 0  . sitaGLIPtin (JANUVIA) 100 MG tablet Take 1 tablet (100 mg total) by mouth daily. 30 tablet 6  . traMADol (ULTRAM) 50 MG tablet TAKE 2 TABLETS BY MOUTH TWICE DAILY AS NEEDED 60 tablet PRN   No facility-administered medications prior to visit.     No Known Allergies  Review of Systems  Constitutional: Negative for fever and malaise/fatigue.  HENT: Negative for congestion.   Eyes: Negative for blurred vision.  Respiratory: Negative for shortness of breath.   Cardiovascular: Negative for chest pain, palpitations and leg swelling.  Gastrointestinal: Negative for abdominal pain, blood in  stool and nausea.  Genitourinary: Negative for dysuria and frequency.  Musculoskeletal: Positive for back pain. Negative for falls.  Skin: Negative for rash.  Neurological: Negative for dizziness, loss of consciousness and headaches.  Endo/Heme/Allergies: Negative for environmental allergies.  Psychiatric/Behavioral: Negative for depression. The patient is not nervous/anxious.        Objective:    Physical Exam  Constitutional: She is oriented to person, place, and time. She appears well-developed and well-nourished. No distress.  HENT:  Head: Normocephalic and atraumatic.  Nose: Nose normal.  Eyes: Right eye exhibits no discharge. Left eye exhibits no discharge.  Neck:  Normal range of motion. Neck supple.  Cardiovascular: Normal rate and regular rhythm.   No murmur heard. Pulmonary/Chest: Effort normal and breath sounds normal.  Abdominal: Soft. Bowel sounds are normal. There is no tenderness.  Musculoskeletal: She exhibits no edema.  Anterior right thigh varicose vein collection  Neurological: She is alert and oriented to person, place, and time.  Skin: Skin is warm and dry.  Psychiatric: She has a normal mood and affect.  Nursing note and vitals reviewed.   There were no vitals taken for this visit. Wt Readings from Last 3 Encounters:  12/01/16 236 lb (107 kg)  11/24/16 236 lb 12.8 oz (107.4 kg)  10/31/16 237 lb 3.2 oz (107.6 kg)     Lab Results  Component Value Date   WBC 11.4 (H) 01/19/2017   HGB 13.8 01/19/2017   HCT 42.0 01/19/2017   PLT 408.0 (H) 01/19/2017   GLUCOSE 154 (H) 01/19/2017   CHOL 157 01/19/2017   TRIG 102.0 01/19/2017   HDL 42.60 01/19/2017   LDLDIRECT 123.0 05/12/2016   LDLCALC 94 01/19/2017   ALT 19 01/19/2017   AST 19 01/19/2017   NA 140 01/19/2017   K 3.2 (L) 01/19/2017   CL 102 01/19/2017   CREATININE 0.83 01/19/2017   BUN 14 01/19/2017   CO2 30 01/19/2017   TSH 1.08 01/19/2017   HGBA1C 7.5 (H) 01/19/2017   MICROALBUR 0.9  11/12/2015    Lab Results  Component Value Date   TSH 1.08 01/19/2017   Lab Results  Component Value Date   WBC 11.4 (H) 01/19/2017   HGB 13.8 01/19/2017   HCT 42.0 01/19/2017   MCV 90.5 01/19/2017   PLT 408.0 (H) 01/19/2017   Lab Results  Component Value Date   NA 140 01/19/2017   K 3.2 (L) 01/19/2017   CO2 30 01/19/2017   GLUCOSE 154 (H) 01/19/2017   BUN 14 01/19/2017   CREATININE 0.83 01/19/2017   BILITOT 0.3 01/19/2017   ALKPHOS 78 01/19/2017   AST 19 01/19/2017   ALT 19 01/19/2017   PROT 7.0 01/19/2017   ALBUMIN 3.9 01/19/2017   CALCIUM 9.5 01/19/2017   GFR 92.06 01/19/2017   Lab Results  Component Value Date   CHOL 157 01/19/2017   Lab Results  Component Value Date   HDL 42.60 01/19/2017   Lab Results  Component Value Date   LDLCALC 94 01/19/2017   Lab Results  Component Value Date   TRIG 102.0 01/19/2017   Lab Results  Component Value Date   CHOLHDL 4 01/19/2017   Lab Results  Component Value Date   HGBA1C 7.5 (H) 01/19/2017       Assessment & Plan:   Problem List Items Addressed This Visit    Diabetes mellitus type 2 in obese ([place])    hgba1c acceptable, minimize simple carbs. Increase exercise as tolerated.       Relevant Orders   Hemoglobin A1c (Completed)   Hyperlipidemia, mixed    Tolerating statin, encouraged heart healthy diet, avoid trans fats, minimize simple carbs and saturated fats. Increase exercise as tolerated      Relevant Orders   Lipid panel (Completed)   Obesity    Encouraged DASH diet, decrease po intake and increase exercise as tolerated. Needs 7-8 hours of sleep nightly. Avoid trans fats, eat small, frequent meals every 4-5 hours with lean proteins, complex carbs and healthy fats. Minimize simple carbs, referred to bariatric      Relevant Orders   Hemoglobin A1c (Completed)  Essential hypertension    Well controlled, no changes to meds. Encouraged heart healthy diet such as the DASH diet and exercise as  tolerated.       Relevant Orders   CBC (Completed)   Comprehensive metabolic panel (Completed)   TSH (Completed)   GERD (gastroesophageal reflux disease)    Avoid offending foods, consider probiotics. Do not eat large meals in late evening and consider raising head of bed.          I am having Ms. Vannatter start on tiZANidine. I am also having her maintain her Lancet Device, multivitamin, aspirin, naproxen, promethazine, metFORMIN, sitaGLIPtin, hydrochlorothiazide, glimepiride, carvedilol, atorvastatin, meclizine, liraglutide, metFORMIN, INVOKANA, nitroGLYCERIN, cyclobenzaprine, albuterol, pantoprazole, glucose blood, ACCU-CHEK GUIDE, ACCU-CHEK FASTCLIX LANCETS, and traMADol.  Meds ordered this encounter  Medications  . traMADol (ULTRAM) 50 MG tablet    Sig: TAKE 2 TABLETS BY MOUTH TWICE DAILY AS NEEDED    Dispense:  60 tablet    Refill:  PRN  . tiZANidine (ZANAFLEX) 4 MG tablet    Sig: Take 1 tablet (4 mg total) by mouth every 6 (six) hours as needed for muscle spasms.    Dispense:  40 tablet    Refill:  1    Penni Homans, MD

## 2017-01-23 ENCOUNTER — Other Ambulatory Visit: Payer: Self-pay | Admitting: Family Medicine

## 2017-01-23 DIAGNOSIS — E876 Hypokalemia: Secondary | ICD-10-CM

## 2017-01-23 MED ORDER — POTASSIUM CHLORIDE CRYS ER 20 MEQ PO TBCR: 20.0000 meq | EXTENDED_RELEASE_TABLET | Freq: Every day | ORAL | 3 refills | Status: DC

## 2017-01-23 MED FILL — POTASSIUM CL ER 20 MEQ TABL: 20 | 30 days supply | Qty: 30 | Fill #0

## 2017-01-31 ENCOUNTER — Other Ambulatory Visit: Payer: Self-pay | Admitting: Family Medicine

## 2017-01-31 MED ORDER — LIRAGLUTIDE 18 MG/3ML ~~LOC~~ SOPN: 1.8000 mg | PEN_INJECTOR | Freq: Every day | SUBCUTANEOUS | 6 refills | Status: DC

## 2017-01-31 MED FILL — VICTOZA 18 MG/3 ML INJECT P: 18 | 30 days supply | Qty: 9 | Fill #0

## 2017-01-31 MED FILL — INVOKANA 100 MG TABLET: 100 | 60 days supply | Qty: 60 | Fill #2

## 2017-01-31 MED FILL — METFORMIN HCL ER 500 MG TAB: 500 | 30 days supply | Qty: 150 | Fill #4

## 2017-02-08 ENCOUNTER — Telehealth: Payer: Self-pay | Admitting: Family Medicine

## 2017-02-08 NOTE — Telephone Encounter (Signed)
Relation to HD:[id number] Call back number: [phone]  Reason for call:  Patient requesting lab orders please place at [facility] lab.

## 2017-02-09 ENCOUNTER — Other Ambulatory Visit: Payer: Self-pay | Admitting: Family Medicine

## 2017-02-09 DIAGNOSIS — D72829 Elevated white blood cell count, unspecified: Secondary | ICD-10-CM

## 2017-02-09 DIAGNOSIS — E876 Hypokalemia: Secondary | ICD-10-CM

## 2017-02-09 MED FILL — CARVEDILOL 3.125 MG TABLET: 3.125 | 30 days supply | Qty: 60 | Fill #0

## 2017-02-09 MED FILL — GLIMEPIRIDE 4 MG TABLET: 4 | 30 days supply | Qty: 60 | Fill #0

## 2017-02-09 MED FILL — HYDROCHLOROTHIAZIDE 25 MG T: 25 | 90 days supply | Qty: 90 | Fill #2

## 2017-02-09 MED FILL — JANUVIA 100 MG TABLET: 100 | 90 days supply | Qty: 90 | Fill #1

## 2017-02-09 NOTE — Telephone Encounter (Signed)
Labs entered for [employer] the patient to inform labs entered as requested (left a detailed message)

## 2017-02-09 NOTE — Telephone Encounter (Signed)
Cbc with diff for leukocytosis and cmp for hypokalemia

## 2017-02-09 NOTE — Telephone Encounter (Signed)
Let me know what to order and will do

## 2017-02-16 ENCOUNTER — Other Ambulatory Visit (INDEPENDENT_AMBULATORY_CARE_PROVIDER_SITE_OTHER): Payer: 59

## 2017-02-16 ENCOUNTER — Ambulatory Visit (INDEPENDENT_AMBULATORY_CARE_PROVIDER_SITE_OTHER): Payer: 59 | Admitting: Podiatry

## 2017-02-16 DIAGNOSIS — M7661 Achilles tendinitis, right leg: Secondary | ICD-10-CM | POA: Diagnosis not present

## 2017-02-16 DIAGNOSIS — D72829 Elevated white blood cell count, unspecified: Secondary | ICD-10-CM

## 2017-02-16 DIAGNOSIS — E876 Hypokalemia: Secondary | ICD-10-CM

## 2017-02-16 DIAGNOSIS — B351 Tinea unguium: Secondary | ICD-10-CM | POA: Diagnosis not present

## 2017-02-16 DIAGNOSIS — M7662 Achilles tendinitis, left leg: Secondary | ICD-10-CM | POA: Diagnosis not present

## 2017-02-16 DIAGNOSIS — L84 Corns and callosities: Secondary | ICD-10-CM | POA: Diagnosis not present

## 2017-02-16 LAB — CBC WITH DIFFERENTIAL/PLATELET
Basophils Absolute: 0 10*3/uL (ref 0.0–0.1)
Basophils Relative: 0.4 % (ref 0.0–3.0)
Eosinophils Absolute: 0.2 10*3/uL (ref 0.0–0.7)
Eosinophils Relative: 2.2 % (ref 0.0–5.0)
HCT: 44.8 % (ref 36.0–46.0)
Hemoglobin: 14.8 g/dL (ref 12.0–15.0)
Lymphocytes Relative: 20.7 % (ref 12.0–46.0)
Lymphs Abs: 2.2 10*3/uL (ref 0.7–4.0)
MCHC: 33.1 g/dL (ref 30.0–36.0)
MCV: 90.3 fl (ref 78.0–100.0)
Monocytes Absolute: 0.5 10*3/uL (ref 0.1–1.0)
Monocytes Relative: 4.9 % (ref 3.0–12.0)
Neutro Abs: 7.5 10*3/uL (ref 1.4–7.7)
Neutrophils Relative %: 71.8 % (ref 43.0–77.0)
Platelets: 410 10*3/uL — ABNORMAL HIGH (ref 150.0–400.0)
RBC: 4.96 Mil/uL (ref 3.87–5.11)
RDW: 14.2 % (ref 11.5–15.5)
WBC: 10.5 10*3/uL (ref 4.0–10.5)

## 2017-02-16 LAB — COMPREHENSIVE METABOLIC PANEL
ALT: 17 U/L (ref 0–35)
AST: 13 U/L (ref 0–37)
Albumin: 4 g/dL (ref 3.5–5.2)
Alkaline Phosphatase: 92 U/L (ref 39–117)
BUN: 16 mg/dL (ref 6–23)
CO2: 29 mEq/L (ref 19–32)
Calcium: 9.7 mg/dL (ref 8.4–10.5)
Chloride: 105 mEq/L (ref 96–112)
Creatinine, Ser: 0.7 mg/dL (ref 0.40–1.20)
GFR: 112.02 mL/min (ref >60.00)
Glucose, Bld: 119 mg/dL — ABNORMAL HIGH (ref 70–99)
Potassium: 3.9 mEq/L (ref 3.5–5.1)
Sodium: 140 mEq/L (ref 135–145)
Total Bilirubin: 0.4 mg/dL (ref 0.2–1.2)
Total Protein: 7.3 g/dL (ref 6.0–8.3)

## 2017-02-17 NOTE — Progress Notes (Signed)
Subjective: 56 year old female presents the office over 2 concerns. She states essentially the appointment she felt that she had a blister which started a callus on the bottom of her left big toe. Since she made the appointment she states that this has resolved. She has not is any drainage or pus recently. Chest states that her nails are discolored and thick. She tried topical antifungal without any significant improvement. She denies any pain with tenderness and she denies any redness or drainage. She states that over the last week or so she started has some mild recurrence of the Achilles pain for her previously. She states that after she is mild to physical therapy after last appointment being this pain resolved. She just mentioned it stays in the side which is not really noticed many problems with the Achilles tendon until just very minimal recently. No recent injury or trauma. Denies any systemic complaints such as fevers, chills, nausea, vomiting. No acute changes since last appointment, and no other complaints at this time.   Objective: AAO x3, NAD DP/PT pulses palpable bilaterally, CRT less than 3 seconds The sulcus of the left hallux is a hyperkeratotic lesion which made impingement old blister. Upon debridement there is no underlying ulceration, drainage or any clinical signs of infection. Nails are hypertrophic, dystrophic, brittle, discolored 10. There is no surrounding redness or drainage. There is no pain to the toenails. The nails are yellow to brown discoloration with some darkened areas within the nail. There is minimal discomfort along the posterior heel along exostosis on the insertion of the Achilles tendon and the left side although this is minimal. There is no pain on the Achilles tendon and Thompson test is negative. There is no edema, erythema, increase in warmth. No other open lesions or pre-ulcerative lesions at this time. There is no pain with calf compression, swelling, warmth,  erythema.  Assessment: Callus left hallux likely result of old blister without signs of infection; onychodystrophy likely onychomycosis; Achilles tendinitis  Plan: -Treatment options discussed including all alternatives, risks, and complications -Etiology of symptoms were discussed -Nails debrided and sent to [facility] for culture/pathology -Debrided hyperkeratotic lesion without any complications or bleeding. Monitor reoccurrence. Discussed the shoe changes all sock changes help control moisture. This may have been from friction as her feet do sweat. -Regards the Achilles tendon I recommended her to get back to exercising, stretching, icing she is doing previously. Discussed change in shoe gear as well. This is likely due to change of shoes due to the weather change. -Follow-up in 4 weeks or sooner if needed.  Celesta Gentile, DPM

## 2017-03-08 MED FILL — VICTOZA 18 MG/3 ML INJECT P: 18 | 30 days supply | Qty: 9 | Fill #1

## 2017-03-14 ENCOUNTER — Other Ambulatory Visit: Payer: Self-pay | Admitting: Family Medicine

## 2017-03-14 MED FILL — METFORMIN HCL ER 500 MG TAB: 500 | 30 days supply | Qty: 150 | Fill #0

## 2017-03-14 MED FILL — CARVEDILOL 3.125 MG TABLET: 3.125 | 30 days supply | Qty: 60 | Fill #1

## 2017-03-16 ENCOUNTER — Encounter: Payer: Self-pay | Admitting: Podiatry

## 2017-03-16 ENCOUNTER — Ambulatory Visit (INDEPENDENT_AMBULATORY_CARE_PROVIDER_SITE_OTHER): Payer: 59 | Admitting: Podiatry

## 2017-03-16 DIAGNOSIS — Z1231 Encounter for screening mammogram for malignant neoplasm of breast: Secondary | ICD-10-CM | POA: Diagnosis not present

## 2017-03-16 DIAGNOSIS — B351 Tinea unguium: Secondary | ICD-10-CM

## 2017-03-16 LAB — HM MAMMOGRAPHY

## 2017-03-16 NOTE — Progress Notes (Signed)
Subjective: 56 year old female presents the also discussed nail culture results. She states that otherwise she'll resume on the blister to the left hallux is resolved. She is asking nails trimmed again today. She denies any pain to the nails and denies any surrounding redness or drainage. She has no other concerns and any changes since last appointment. Denies any systemic complaints such as fevers, chills, nausea, vomiting. No acute changes since last appointment, and no other complaints at this time.   Objective: AAO x3, NAD DP/PT pulses palpable bilaterally, CRT less than 3 seconds Nails are very dystrophic, discolored, hypertrophic and there is minimal elongation. There is no swelling redness or drainage. No signs of infection. There is no open lesions or pre-ulcerative lesions or blistering identified today.  No pain along the course or insertion of the Achilles tendons bilaterally. Tendons appear to be intact. No pain with calf compression, swelling, warmth, erythema  Assessment: Onychomycosis  Plan: -All treatment options discussed with the patient including all alternatives, risks, complications.  -Nail culture results were discussed the patient. She wished about any oral medications although he does regress for her. I ordered a compound cream from [facility] -Continue stretching and rehabilitation exercises for Achilles tendinitis is doing well. -Patient encouraged to call the office with any questions, concerns, change in symptoms.   Celesta Gentile, DPM

## 2017-03-17 LAB — HM MAMMOGRAPHY

## 2017-03-27 MED FILL — GLIMEPIRIDE 4 MG TABLET: 4 | 30 days supply | Qty: 60 | Fill #1

## 2017-03-28 ENCOUNTER — Encounter: Payer: Self-pay | Admitting: Family Medicine

## 2017-03-28 NOTE — Progress Notes (Signed)
6 [phone] 18   

## 2017-04-24 ENCOUNTER — Other Ambulatory Visit: Payer: Self-pay | Admitting: Family Medicine

## 2017-04-24 MED FILL — HYDROCHLOROTHIAZIDE 25 MG T: 25 | 90 days supply | Qty: 90 | Fill #0

## 2017-04-24 MED FILL — INVOKANA 100 MG TABLET: 100 | 30 days supply | Qty: 30 | Fill #0

## 2017-04-24 MED FILL — UNIFINE PENTIPS 31GX3/16": 31G X 5 MM | 90 days supply | Qty: 100 | Fill #0

## 2017-04-24 MED FILL — ATORVASTATIN 20 MG TABLET: 20 | 90 days supply | Qty: 90 | Fill #0

## 2017-04-24 MED FILL — METFORMIN HCL ER 500 MG TAB: 500 | 30 days supply | Qty: 150 | Fill #1

## 2017-04-24 MED FILL — UNIFINE PENTIPS 31GX3/16: 31G X 5 MM | 90 days supply | Qty: 100 | Fill #0

## 2017-04-24 MED FILL — PANTOPRAZOLE SOD DR 40 MG T: 40 | 90 days supply | Qty: 90 | Fill #1

## 2017-04-24 MED FILL — GLIMEPIRIDE 4 MG TABLET: 4 | 30 days supply | Qty: 60 | Fill #2

## 2017-04-24 MED FILL — VICTOZA 18 MG/3 ML INJECT P: 18 | 30 days supply | Qty: 9 | Fill #2

## 2017-04-27 ENCOUNTER — Ambulatory Visit (INDEPENDENT_AMBULATORY_CARE_PROVIDER_SITE_OTHER): Payer: 59 | Admitting: Family Medicine

## 2017-04-27 ENCOUNTER — Encounter: Payer: Self-pay | Admitting: Family Medicine

## 2017-04-27 VITALS — BP 122/82 | HR 69 | Temp 97.5°F | Resp 18 | Wt 241.6 lb

## 2017-04-27 DIAGNOSIS — I10 Essential (primary) hypertension: Secondary | ICD-10-CM | POA: Diagnosis not present

## 2017-04-27 DIAGNOSIS — E6609 Other obesity due to excess calories: Secondary | ICD-10-CM

## 2017-04-27 DIAGNOSIS — G5792 Unspecified mononeuropathy of left lower limb: Secondary | ICD-10-CM | POA: Diagnosis not present

## 2017-04-27 DIAGNOSIS — M25572 Pain in left ankle and joints of left foot: Secondary | ICD-10-CM

## 2017-04-27 DIAGNOSIS — E1169 Type 2 diabetes mellitus with other specified complication: Secondary | ICD-10-CM

## 2017-04-27 DIAGNOSIS — E669 Obesity, unspecified: Secondary | ICD-10-CM

## 2017-04-27 DIAGNOSIS — E782 Mixed hyperlipidemia: Secondary | ICD-10-CM

## 2017-04-27 DIAGNOSIS — M792 Neuralgia and neuritis, unspecified: Secondary | ICD-10-CM

## 2017-04-27 HISTORY — DX: Neuralgia and neuritis, unspecified: M79.2

## 2017-04-27 NOTE — Assessment & Plan Note (Signed)
hgba1c acceptable, minimize simple carbs. Increase exercise as tolerated. Continue current meds 

## 2017-04-27 NOTE — Assessment & Plan Note (Signed)
Encouraged DASH diet, decrease po intake and increase exercise as tolerated. Needs 7-8 hours of sleep nightly. Avoid trans fats, eat small, frequent meals every 4-5 hours with lean proteins, complex carbs and healthy fats. Minimize simple carbs 

## 2017-04-27 NOTE — Assessment & Plan Note (Signed)
Well controlled, no changes to meds. Encouraged heart healthy diet such as the DASH diet and exercise as tolerated.  °

## 2017-04-27 NOTE — Assessment & Plan Note (Signed)
Tolerating statin, encouraged heart healthy diet, avoid trans fats, minimize simple carbs and saturated fats. Increase exercise as tolerated 

## 2017-04-27 NOTE — Progress Notes (Signed)
Subjective:  .pac  Patient ID: Eileen Richards, female    DOB: [birth year], 56 y.o.   MRN: [id number]  No chief complaint on file.   HPI  Patient is in today for follow up. Patient c/o her left ankle pain, it has been present for several weeks. No injury, warmth or redness. No history of gout. No falls or trauma. Denies CP/palp/SOB/HA/congestion/fevers/GI or GU c/o. Taking meds as prescribed. No polyuria or polydipsia.  Patient Care Team: Mosie Lukes, MD as PCP - General (Family Medicine) Barrington Ellison, RN as [place] Servando Salina, MD as Consulting Physician (Obstetrics and Gynecology) Juanita Craver, MD as Consulting Physician (Gastroenterology)   Past Medical History:  Diagnosis Date  . Achilles tendonitis 10/14/2012  . Asthma    childhood  . Atypical chest pain    normal stress echo 02/2009 -LV size was normal LV global systolic function was normal- Normal wall motion; no LV regional wall motion abnormalities  . Benign paroxysmal positional vertigo 04/26/2015  . Chest pain, atypical 08/24/2013  . Diabetes mellitus   . GERD (gastroesophageal reflux disease)   . Hand pain, left 11/10/2015  . Hyperlipemia   . Hypertension   . Neuropathic pain of ankle, left 04/27/2017  . Onychomycosis 04/17/2014  . Pain of right heel 10/14/2012  . Preventative health care 11/22/2015  . Sinusitis, acute 01/17/2016  . Thoracic outlet syndrome 11/03/2014  . Urinary urgency 12/15/2015    Past Surgical History:  Procedure Laterality Date  . ABDOMINAL HYSTERECTOMY    . CYSTOSCOPY  07/23/2012   Procedure: CYSTOSCOPY;  Surgeon: Marvene Staff, MD;  Location: [facility];  Service: Gynecology;  Laterality: N/A;  . DILATION AND CURETTAGE OF UTERUS     Lysis of Adhesions  . ROBOTIC ASSISTED LAPAROSCOPIC LYSIS OF ADHESION  07/23/2012   Procedure: ROBOTIC ASSISTED LAPAROSCOPIC LYSIS OF ADHESION;  Surgeon: Marvene Staff, MD;  Location: [facility];  Service: Gynecology;   Laterality: N/A;  . ROBOTIC ASSISTED SALPINGO OOPHERECTOMY  07/23/2012   Procedure: ROBOTIC ASSISTED SALPINGO OOPHERECTOMY;  Surgeon: Marvene Staff, MD;  Location: [facility];  Service: Gynecology;  Laterality: Bilateral;    Family History  Problem Relation Age of Onset  . GER disease Mother   . GER disease Father   . Heart disease Father        father has "heart problems"  . Anemia Father     Social History   Social History  . Marital status: Single    Spouse name: N/A  . Number of children: N/A  . Years of education: N/A   Occupational History  . Not on file.   Social History Main Topics  . Smoking status: Former Research scientist (life sciences)  . Smokeless tobacco: Never Used  . Alcohol use No  . Drug use: No  . Sexual activity: Not on file   Other Topics Concern  . Not on file   Social History Narrative  . No narrative on file    Outpatient Medications Prior to Visit  Medication Sig Dispense Refill  . ACCU-CHEK FASTCLIX LANCETS MISC Use as directed twice daily to check blood sugar.  DX E11.9 102 each 6  . albuterol (PROVENTIL HFA;VENTOLIN HFA) 108 (90 Base) MCG/ACT inhaler Inhale 2 puffs into the lungs every 6 (six) hours as needed for wheezing or shortness of breath. 1 Inhaler 6  . aspirin 81 MG tablet Take 81 mg by mouth daily.    Marland Kitchen atorvastatin (LIPITOR) 20 MG tablet TAKE 1  TABLET (20 MG TOTAL) BY MOUTH DAILY. 90 tablet 3  . Blood Glucose Monitoring Suppl (ACCU-CHEK GUIDE) w/Device KIT Inject 1 kit as directed 2 (two) times daily. Use to check blood sugar.  DX E11.9 1 kit 0  . carvedilol (COREG) 3.125 MG tablet TAKE 1 TABLET BY MOUTH 2 TIMES DAILY WITH A MEAL 60 tablet 6  . carvedilol (COREG) 3.125 MG tablet TAKE 1 TABLET BY MOUTH TWICE DAILY WITH MEALS 60 tablet 11  . glimepiride (AMARYL) 4 MG tablet TAKE 1 TABLET (4 MG TOTAL) BY MOUTH 2 (TWO) TIMES DAILY. 60 tablet 6  . glucose blood (ACCU-CHEK GUIDE) test strip Use as directed twice daily to check blood sugar.  DX E11.9 100 each  6  . hydrochlorothiazide (HYDRODIURIL) 25 MG tablet TAKE 1 TABLET (25 MG TOTAL) BY MOUTH DAILY. 90 tablet 5  . INVOKANA 100 MG TABS tablet TAKE 1 TABLET (100 MG TOTAL) BY MOUTH DAILY. 30 tablet 5  . Lancet Device MISC by Does not apply route. Use to test blood sugar three times daily     . liraglutide (VICTOZA) 18 MG/3ML SOPN Inject 0.3 mLs (1.8 mg total) into the skin daily. 6 mL 6  . meclizine (ANTIVERT) 25 MG tablet Take 1 tablet (25 mg total) by mouth 3 (three) times daily as needed for dizziness. 30 tablet 0  . metFORMIN (GLUCOPHAGE-XR) 500 MG 24 hr tablet TAKE 2 TABLETS BY MOUTH TWICE DAILY AND 1 TABLET AT NOON 150 tablet 5  . metFORMIN (GLUCOPHAGE-XR) 500 MG 24 hr tablet TAKE 2 TABLETS BY MOUTH TWICE DAILY AND 1 TABLET AT NOON 150 tablet 5  . Multiple Vitamin (MULTIVITAMIN) tablet Take 1 tablet by mouth daily. Reported on 01/12/2016    . naproxen (NAPROSYN) 500 MG tablet Take 1 tablet (500 mg total) by mouth 2 (two) times daily as needed for mild pain or moderate pain (with food). 60 tablet 1  . nitroGLYCERIN (NITROSTAT) 0.4 MG SL tablet Place 1 tablet (0.4 mg total) under the tongue every 5 (five) minutes as needed for chest pain. 25 tablet 3  . NONFORMULARY OR COMPOUNDED ITEM [facility]  Onychomycosis Nail Lacquer -  Fluconazole 2%, Terbinafine 1% DMSO Apply to affected nail once daily Qty. 120 gm 3 refills    . pantoprazole (PROTONIX) 40 MG tablet TAKE 1 TABLET BY MOUTH ONCE DAILY 30 tablet 5  . potassium chloride SA (K-DUR,KLOR-CON) 20 MEQ tablet Take 1 tablet (20 mEq total) by mouth daily. 30 tablet 3  . promethazine (PHENERGAN) 25 MG tablet Take 1 tablet (25 mg total) by mouth every 8 (eight) hours as needed for nausea or vomiting. 20 tablet 0  . sitaGLIPtin (JANUVIA) 100 MG tablet Take 1 tablet (100 mg total) by mouth daily. 30 tablet 6  . tiZANidine (ZANAFLEX) 4 MG tablet Take 1 tablet (4 mg total) by mouth every 6 (six) hours as needed for muscle spasms. 40 tablet 1  .  traMADol (ULTRAM) 50 MG tablet TAKE 2 TABLETS BY MOUTH TWICE DAILY AS NEEDED 60 tablet PRN  . UNIFINE PENTIPS 31G X 5 MM MISC USE AS DIRECTED 100 each 2   No facility-administered medications prior to visit.     No Known Allergies  Review of Systems  Constitutional: Negative for fever and malaise/fatigue.  HENT: Negative for congestion.   Eyes: Negative for blurred vision.  Respiratory: Negative for cough and shortness of breath.   Cardiovascular: Negative for chest pain, palpitations and leg swelling.  Gastrointestinal: Negative for vomiting.  Musculoskeletal: Positive for joint pain. Negative for back pain.  Skin: Negative for rash.  Neurological: Negative for loss of consciousness and headaches.       Objective:    Physical Exam  Constitutional: She is oriented to person, place, and time. She appears well-developed and well-nourished. No distress.  HENT:  Head: Normocephalic and atraumatic.  Eyes: Conjunctivae are normal.  Neck: Normal range of motion. No thyromegaly present.  Cardiovascular: Normal rate and regular rhythm.   Pulmonary/Chest: Effort normal and breath sounds normal. She has no wheezes.  Abdominal: Soft. Bowel sounds are normal. There is no tenderness.  Musculoskeletal: Normal range of motion. She exhibits no edema or deformity.  Neurological: She is alert and oriented to person, place, and time.  Skin: Skin is warm and dry. She is not diaphoretic.  Psychiatric: She has a normal mood and affect.    BP 122/82 (BP Location: Left Arm, Patient Position: Sitting, Cuff Size: Normal)   Pulse 69   Temp (!) 97.5 F (36.4 C) (Oral)   Resp 18   Wt 241 lb 9.6 oz (109.6 kg)   SpO2 98%   BMI 41.47 kg/m  Wt Readings from Last 3 Encounters:  04/27/17 241 lb 9.6 oz (109.6 kg)  12/01/16 236 lb (107 kg)  11/24/16 236 lb 12.8 oz (107.4 kg)   BP Readings from Last 3 Encounters:  04/27/17 122/82  11/24/16 116/80  10/31/16 124/70     Immunization History    Administered Date(s) Administered  . Influenza Whole 06/22/2010, 07/10/2012, 06/19/2013  . Influenza-Unspecified 06/19/2014, 06/20/2015  . Pneumococcal Conjugate-13 05/13/2016  . Td 08/19/2001    Health Maintenance  Topic Date Due  . PNEUMOCOCCAL POLYSACCHARIDE VACCINE (1) 10/31/1964  . COLONOSCOPY  10/31/2012  . URINE MICROALBUMIN  11/11/2016  . INFLUENZA VACCINE  04/19/2017  . OPHTHALMOLOGY EXAM  10/27/2017  . HEMOGLOBIN A1C  10/28/2017  . FOOT EXAM  10/31/2017  . TETANUS/TDAP  03/03/2018  . MAMMOGRAM  03/17/2019  . Hepatitis C Screening  Completed  . HIV Screening  Completed    Lab Results  Component Value Date   WBC 9.7 04/27/2017   HGB 14.5 04/27/2017   HCT 45.4 04/27/2017   PLT 393.0 04/27/2017   GLUCOSE 140 (H) 04/27/2017   CHOL 183 04/27/2017   TRIG 130.0 04/27/2017   HDL 41.20 04/27/2017   LDLDIRECT 123.0 05/12/2016   LDLCALC 115 (H) 04/27/2017   ALT 14 04/27/2017   AST 12 04/27/2017   NA 139 04/27/2017   K 4.2 04/27/2017   CL 102 04/27/2017   CREATININE 0.76 04/27/2017   BUN 11 04/27/2017   CO2 33 (H) 04/27/2017   TSH 1.36 04/27/2017   HGBA1C 7.7 (H) 04/27/2017   MICROALBUR 0.9 11/12/2015    Lab Results  Component Value Date   TSH 1.36 04/27/2017   Lab Results  Component Value Date   WBC 9.7 04/27/2017   HGB 14.5 04/27/2017   HCT 45.4 04/27/2017   MCV 93.9 04/27/2017   PLT 393.0 04/27/2017   Lab Results  Component Value Date   NA 139 04/27/2017   K 4.2 04/27/2017   CO2 33 (H) 04/27/2017   GLUCOSE 140 (H) 04/27/2017   BUN 11 04/27/2017   CREATININE 0.76 04/27/2017   BILITOT 0.3 04/27/2017   ALKPHOS 82 04/27/2017   AST 12 04/27/2017   ALT 14 04/27/2017   PROT 7.3 04/27/2017   ALBUMIN 4.1 04/27/2017   CALCIUM 9.2 04/27/2017   GFR 101.81 04/27/2017   Lab Results  Component Value Date   CHOL 183 04/27/2017   Lab Results  Component Value Date   HDL 41.20 04/27/2017   Lab Results  Component Value Date   LDLCALC 115 (H)  04/27/2017   Lab Results  Component Value Date   TRIG 130.0 04/27/2017   Lab Results  Component Value Date   CHOLHDL 4 04/27/2017   Lab Results  Component Value Date   HGBA1C 7.7 (H) 04/27/2017         Assessment & Plan:   Problem List Items Addressed This Visit    Diabetes mellitus type 2 in obese ([place])    hgba1c acceptable, minimize simple carbs. Increase exercise as tolerated. Continue current meds      Relevant Orders   Hemoglobin A1c (Completed)   Hyperlipidemia, mixed    Tolerating statin, encouraged heart healthy diet, avoid trans fats, minimize simple carbs and saturated fats. Increase exercise as tolerated      Relevant Orders   Lipid panel (Completed)   Obesity    Encouraged DASH diet, decrease po intake and increase exercise as tolerated. Needs 7-8 hours of sleep nightly. Avoid trans fats, eat small, frequent meals every 4-5 hours with lean proteins, complex carbs and healthy fats. Minimize simple carbs.      Essential hypertension    Well controlled, no changes to meds. Encouraged heart healthy diet such as the DASH diet and exercise as tolerated.       Relevant Orders   CBC (Completed)   Comprehensive metabolic panel (Completed)   TSH (Completed)   Neuropathic pain of ankle, left    Other Visit Diagnoses    Left ankle pain, unspecified chronicity    -  Primary   Relevant Orders   Uric acid (Completed)      I am having Ms. Mortimer Fries maintain her Lancet Device, multivitamin, aspirin, naproxen, promethazine, metFORMIN, sitaGLIPtin, carvedilol, meclizine, metFORMIN, nitroGLYCERIN, albuterol, pantoprazole, glucose blood, ACCU-CHEK GUIDE, ACCU-CHEK FASTCLIX LANCETS, traMADol, tiZANidine, potassium chloride SA, liraglutide, carvedilol, glimepiride, hydrochlorothiazide, NONFORMULARY OR COMPOUNDED ITEM, atorvastatin, UNIFINE PENTIPS, and INVOKANA.  No orders of the defined types were placed in this encounter.   CMA served as Education administrator during this visit.  History, Physical and Plan performed by medical provider. Documentation and orders reviewed and attested to.  Penni Homans, MD

## 2017-04-27 NOTE — Patient Instructions (Addendum)
  Ice and topical gel twice daily and Naproxen 500 mg tabs 1 tab twice daily x 7 days with food  Ankle Sprain An ankle sprain is a stretch or tear in one of the tough tissues (ligaments) in your ankle. Follow these instructions at home:  Rest your ankle.  Take over-the-counter and prescription medicines only as told by your doctor.  For 2-3 days, keep your ankle higher than the level of your heart (elevated) as much as possible.  If directed, put ice on the area: ? Put ice in a plastic bag. ? Place a towel between your skin and the bag. ? Leave the ice on for 20 minutes, 2-3 times a day.  If you were given a brace: ? Wear it as told. ? Take it off to shower or bathe. ? Try not to move your ankle much, but wiggle your toes from time to time. This helps to prevent swelling.  If you were given an elastic bandage (dressing): ? Take it off when you shower or bathe. ? Try not to move your ankle much, but wiggle your toes from time to time. This helps to prevent swelling. ? Adjust the bandage to make it more comfortable if it feels too tight. ? Loosen the bandage if you lose feeling in your foot, your foot tingles, or your foot gets cold and blue.  If you have crutches, use them as told by your doctor. Continue to use them until you can walk without feeling pain in your ankle. Contact a doctor if:  Your bruises or swelling are quickly getting worse.  Your pain does not get better after you take medicine. Get help right away if:  You cannot feel your toes or foot.  Your toes or your foot looks blue.  You have very bad pain that gets worse. This information is not intended to replace advice given to you by your health care provider. Make sure you discuss any questions you have with your health care provider. Document Released: 02/22/2008 Document Revised: 02/11/2016 Document Reviewed: 04/07/2015 Elsevier Interactive Patient Education  [employer].

## 2017-04-28 LAB — LIPID PANEL
Cholesterol: 183 mg/dL (ref 0–200)
HDL: 41.2 mg/dL (ref >39.00)
LDL Cholesterol: 115 mg/dL — ABNORMAL HIGH (ref 0–99)
NonHDL: 141.49
Total CHOL/HDL Ratio: 4
Triglycerides: 130 mg/dL (ref 0.0–149.0)
VLDL: 26 mg/dL (ref 0.0–40.0)

## 2017-04-28 LAB — COMPREHENSIVE METABOLIC PANEL
ALT: 14 U/L (ref 0–35)
AST: 12 U/L (ref 0–37)
Albumin: 4.1 g/dL (ref 3.5–5.2)
Alkaline Phosphatase: 82 U/L (ref 39–117)
BUN: 11 mg/dL (ref 6–23)
CO2: 33 mEq/L — ABNORMAL HIGH (ref 19–32)
Calcium: 9.2 mg/dL (ref 8.4–10.5)
Chloride: 102 mEq/L (ref 96–112)
Creatinine, Ser: 0.76 mg/dL (ref 0.40–1.20)
GFR: 101.81 mL/min (ref >60.00)
Glucose, Bld: 140 mg/dL — ABNORMAL HIGH (ref 70–99)
Potassium: 4.2 mEq/L (ref 3.5–5.1)
Sodium: 139 mEq/L (ref 135–145)
Total Bilirubin: 0.3 mg/dL (ref 0.2–1.2)
Total Protein: 7.3 g/dL (ref 6.0–8.3)

## 2017-04-28 LAB — URIC ACID: Uric Acid, Serum: 5.5 mg/dL (ref 2.4–7.0)

## 2017-04-28 LAB — CBC
HCT: 45.4 % (ref 36.0–46.0)
Hemoglobin: 14.5 g/dL (ref 12.0–15.0)
MCHC: 31.8 g/dL (ref 30.0–36.0)
MCV: 93.9 fl (ref 78.0–100.0)
Platelets: 393 10*3/uL (ref 150.0–400.0)
RBC: 4.83 Mil/uL (ref 3.87–5.11)
RDW: 14.4 % (ref 11.5–15.5)
WBC: 9.7 10*3/uL (ref 4.0–10.5)

## 2017-04-28 LAB — HEMOGLOBIN A1C: Hgb A1c MFr Bld: 7.7 % — ABNORMAL HIGH (ref 4.6–6.5)

## 2017-04-28 LAB — TSH: TSH: 1.36 u[IU]/mL (ref 0.35–4.50)

## 2017-05-14 IMAGING — NM NM MISC PROCEDURE
6 series · 36 of 36 positions shown · non-contrast
Comparison: none

[Series 1: rest · 6.40mm/px · 6 of 64 frames shown]
[frame 6/64]
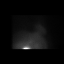
[frame 16/64]
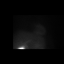
[frame 27/64]
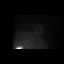
[frame 38/64]
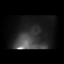
[frame 48/64]
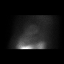
[frame 59/64]
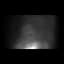

[Series 1: wbr_s-proj_st stress-sum-em · 6.40mm/px · 6 of 64 frames shown]
[frame 6/64]
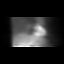
[frame 16/64]
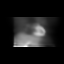
[frame 27/64]
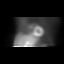
[frame 38/64]
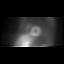
[frame 48/64]
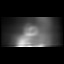
[frame 59/64]
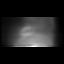

[Series 1: stress-gsp · 6.40mm/px · 6 of 506 frames shown]
[frame 43/506  full-range]
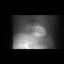
[frame 127/506  full-range]
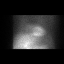
[frame 211/506  full-range]
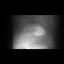
[frame 296/506  full-range]
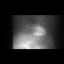
[frame 380/506  full-range]
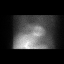
[frame 464/506  full-range]
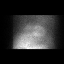

[Series 1: stress-sum-em · 6.40mm/px · 6 of 64 frames shown]
[frame 6/64]
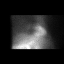
[frame 16/64]
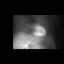
[frame 27/64]
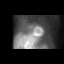
[frame 38/64]
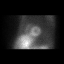
[frame 48/64]
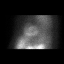
[frame 59/64]
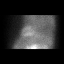

[Series 1: wbr_r-proj_st rest · 6.40mm/px · 6 of 64 frames shown]
[frame 6/64]
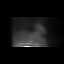
[frame 16/64]
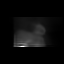
[frame 27/64]
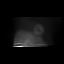
[frame 38/64]
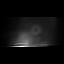
[frame 48/64]
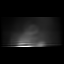
[frame 59/64]
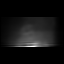

[Series 1: wbr_s-proj_st stress-gsp · 6.40mm/px · 6 of 512 frames shown]
[frame 43/512]
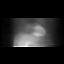
[frame 128/512]
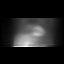
[frame 214/512]
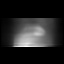
[frame 299/512]
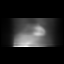
[frame 384/512]
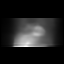
[frame 470/512]
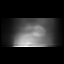

[36 of 36 positions shown; findings below may reference images not displayed]

Canned report from images found in remote index.

Refer to host system for actual result text.

## 2017-05-29 ENCOUNTER — Telehealth: Payer: Self-pay | Admitting: *Deleted

## 2017-05-29 NOTE — Telephone Encounter (Signed)
Received Disability and Leave paperwork from [employer], will complete as much as possible and forward to provider/SLS 09/10

## 2017-05-31 ENCOUNTER — Other Ambulatory Visit: Payer: Self-pay | Admitting: Family Medicine

## 2017-05-31 MED FILL — METFORMIN HCL ER 500 MG TAB: 500 | 30 days supply | Qty: 150 | Fill #2

## 2017-05-31 MED FILL — INVOKANA 100 MG TABLET: 100 | 30 days supply | Qty: 30 | Fill #1

## 2017-05-31 MED FILL — CARVEDILOL 3.125 MG TABLET: 3.125 | 30 days supply | Qty: 60 | Fill #2

## 2017-05-31 MED FILL — GLIMEPIRIDE 4 MG TABLET: 4 | 30 days supply | Qty: 60 | Fill #3

## 2017-05-31 NOTE — Telephone Encounter (Signed)
Paperwork faxed with confirmation on 05/30/17/SLS 09/12

## 2017-06-01 MED FILL — JANUVIA 100 MG TABLET: 100 | 30 days supply | Qty: 30 | Fill #0

## 2017-06-06 MED FILL — POTASSIUM CL ER 20 MEQ TABL: 20 | 30 days supply | Qty: 30 | Fill #1

## 2017-06-15 ENCOUNTER — Ambulatory Visit: Payer: 59 | Admitting: Podiatry

## 2017-06-16 ENCOUNTER — Ambulatory Visit: Payer: 59 | Admitting: Podiatry

## 2017-06-27 ENCOUNTER — Ambulatory Visit: Payer: 59 | Admitting: Podiatry

## 2017-06-27 DIAGNOSIS — D1039 Benign neoplasm of other parts of mouth: Secondary | ICD-10-CM | POA: Diagnosis not present

## 2017-06-30 MED FILL — VICTOZA 18 MG/3 ML INJECT P: 18 | 30 days supply | Qty: 9 | Fill #3

## 2017-06-30 MED FILL — JANUVIA 100 MG TABLET: 100 | 30 days supply | Qty: 30 | Fill #1

## 2017-07-17 MED FILL — CARVEDILOL 3.125 MG TABLET: 3.125 | 30 days supply | Qty: 60 | Fill #3

## 2017-07-17 MED FILL — HYDROCHLOROTHIAZIDE 25 MG T: 25 | 90 days supply | Qty: 90 | Fill #1

## 2017-07-17 MED FILL — METFORMIN HCL ER 500 MG TAB: 500 | 30 days supply | Qty: 150 | Fill #3

## 2017-07-17 MED FILL — INVOKANA 100 MG TABLET: 100 | 30 days supply | Qty: 30 | Fill #2

## 2017-07-17 MED FILL — GLIMEPIRIDE 4 MG TABLET: 4 | 30 days supply | Qty: 60 | Fill #4

## 2017-08-03 ENCOUNTER — Encounter: Payer: Self-pay | Admitting: Family Medicine

## 2017-08-03 ENCOUNTER — Ambulatory Visit (INDEPENDENT_AMBULATORY_CARE_PROVIDER_SITE_OTHER): Payer: 59 | Admitting: Family Medicine

## 2017-08-03 VITALS — BP 136/88 | HR 76 | Temp 98.0°F | Resp 18 | Wt 244.8 lb

## 2017-08-03 DIAGNOSIS — E1169 Type 2 diabetes mellitus with other specified complication: Secondary | ICD-10-CM | POA: Diagnosis not present

## 2017-08-03 DIAGNOSIS — E782 Mixed hyperlipidemia: Secondary | ICD-10-CM | POA: Diagnosis not present

## 2017-08-03 DIAGNOSIS — E669 Obesity, unspecified: Secondary | ICD-10-CM | POA: Diagnosis not present

## 2017-08-03 DIAGNOSIS — I10 Essential (primary) hypertension: Secondary | ICD-10-CM

## 2017-08-03 DIAGNOSIS — M25552 Pain in left hip: Secondary | ICD-10-CM

## 2017-08-03 MED ORDER — TIZANIDINE HCL 4 MG PO TABS: 4.0000 mg | ORAL_TABLET | Freq: Four times a day (QID) | ORAL | 0 refills | Status: DC | PRN

## 2017-08-03 MED ORDER — METHYLPREDNISOLONE 4 MG PO TABS: ORAL_TABLET | ORAL | 0 refills | Status: DC

## 2017-08-03 MED FILL — METHYLPREDNISOLONE 4 MG TAB: 4 | 5 days supply | Qty: 15 | Fill #0

## 2017-08-03 MED FILL — tiZANidine HCL 4 MG TABS: 4 | 7 days supply | Qty: 30 | Fill #0

## 2017-08-03 NOTE — Assessment & Plan Note (Signed)
Well controlled, no changes to meds. Encouraged heart healthy diet such as the DASH diet and exercise as tolerated.  °

## 2017-08-03 NOTE — Patient Instructions (Signed)

## 2017-08-03 NOTE — Progress Notes (Signed)
Subjective:  I acted as a Education administrator for Dr. Charlett Blake. Eileen Richards, [place]  Patient ID: Eileen Richards, female    DOB: [birth year], 56 y.o.   MRN: [id number]  No chief complaint on file.   HPI  Patient is in today for a follow up and is doing well today. She has been struggling with chronic back pain and b/l hip pain, no fall or injury. No incontinence. She is noting that insurance is making her switch from Victoza to Trulicity and off of Lucama as well but unsure what they will allow her to change to. Her sugars continue to spike above 200 but usually stay in the 100s. Denies CP/palp/SOB/HA/congestion/fevers/GI or GU c/o. Taking meds as prescribed  Patient Care Team: Mosie Lukes, MD as PCP - General (Family Medicine) Barrington Ellison, RN as [facility] [facility] Servando Salina, MD as Consulting Physician (Obstetrics and Gynecology) Juanita Craver, MD as Consulting Physician (Gastroenterology)   Past Medical History:  Diagnosis Date  . Achilles tendonitis 10/14/2012  . Asthma    childhood  . Atypical chest pain    normal stress echo 02/2009 -LV size was normal LV global systolic function was normal- Normal wall motion; no LV regional wall motion abnormalities  . Benign paroxysmal positional vertigo 04/26/2015  . Chest pain, atypical 08/24/2013  . Diabetes mellitus   . GERD (gastroesophageal reflux disease)   . Hand pain, left 11/10/2015  . Hyperlipemia   . Hypertension   . Neuropathic pain of ankle, left 04/27/2017  . Onychomycosis 04/17/2014  . Pain of right heel 10/14/2012  . Preventative health care 11/22/2015  . Sinusitis, acute 01/17/2016  . Thoracic outlet syndrome 11/03/2014  . Urinary urgency 12/15/2015    Past Surgical History:  Procedure Laterality Date  . ABDOMINAL HYSTERECTOMY    . CYSTOSCOPY N/A 07/23/2012   Performed by Marvene Staff, MD at [facility] ORS  . DILATION AND CURETTAGE OF UTERUS     Lysis of Adhesions  . ROBOTIC ASSISTED LAPAROSCOPIC LYSIS OF  ADHESION N/A 07/23/2012   Performed by Marvene Staff, MD at [facility] ORS  . ROBOTIC ASSISTED SALPINGO OOPHORECTOMY Bilateral 07/23/2012   Performed by Marvene Staff, MD at [facility] OR[facility]    Family History  Problem Relation Age of Onset  . GER disease Mother   . GER disease Father   . Heart disease Father        father has "heart problems"  . Anemia Father     Social History   Socioeconomic History  . Marital status: Single    Spouse name: Not on file  . Number of children: Not on file  . Years of education: Not on file  . Highest education level: Not on file  Social Needs  . Financial resource strain: Not on file  . Food insecurity - worry: Not on file  . Food insecurity - inability: Not on file  . Transportation needs - medical: Not on file  . Transportation needs - non-medical: Not on file  Occupational History  . Not on file  Tobacco Use  . Smoking status: Former Research scientist (life sciences)  . Smokeless tobacco: Never Used  Substance and Sexual Activity  . Alcohol use: No  . Drug use: No  . Sexual activity: Not on file  Other Topics Concern  . Not on file  Social History Narrative  . Not on file    Outpatient Medications Prior to Visit  Medication Sig Dispense Refill  . ACCU-CHEK FASTCLIX LANCETS MISC Use  as directed twice daily to check blood sugar.  DX E11.9 102 each 6  . albuterol (PROVENTIL HFA;VENTOLIN HFA) 108 (90 Base) MCG/ACT inhaler Inhale 2 puffs into the lungs every 6 (six) hours as needed for wheezing or shortness of breath. 1 Inhaler 6  . aspirin 81 MG tablet Take 81 mg by mouth daily.    Marland Kitchen atorvastatin (LIPITOR) 20 MG tablet TAKE 1 TABLET (20 MG TOTAL) BY MOUTH DAILY. 90 tablet 3  . Blood Glucose Monitoring Suppl (ACCU-CHEK GUIDE) w/Device KIT Inject 1 kit as directed 2 (two) times daily. Use to check blood sugar.  DX E11.9 1 kit 0  . carvedilol (COREG) 3.125 MG tablet TAKE 1 TABLET BY MOUTH 2 TIMES DAILY WITH A MEAL 60 tablet 6  . carvedilol (COREG) 3.125 MG tablet  TAKE 1 TABLET BY MOUTH TWICE DAILY WITH MEALS 60 tablet 11  . glimepiride (AMARYL) 4 MG tablet TAKE 1 TABLET (4 MG TOTAL) BY MOUTH 2 (TWO) TIMES DAILY. 60 tablet 6  . glucose blood (ACCU-CHEK GUIDE) test strip Use as directed twice daily to check blood sugar.  DX E11.9 100 each 6  . hydrochlorothiazide (HYDRODIURIL) 25 MG tablet TAKE 1 TABLET (25 MG TOTAL) BY MOUTH DAILY. 90 tablet 5  . INVOKANA 100 MG TABS tablet TAKE 1 TABLET (100 MG TOTAL) BY MOUTH DAILY. 30 tablet 5  . JANUVIA 100 MG tablet TAKE 1 TABLET (100 MG TOTAL) BY MOUTH DAILY. 30 tablet 6  . Lancet Device MISC by Does not apply route. Use to test blood sugar three times daily     . liraglutide (VICTOZA) 18 MG/3ML SOPN Inject 0.3 mLs (1.8 mg total) into the skin daily. 6 mL 6  . meclizine (ANTIVERT) 25 MG tablet Take 1 tablet (25 mg total) by mouth 3 (three) times daily as needed for dizziness. 30 tablet 0  . metFORMIN (GLUCOPHAGE-XR) 500 MG 24 hr tablet TAKE 2 TABLETS BY MOUTH TWICE DAILY AND 1 TABLET AT NOON 150 tablet 5  . metFORMIN (GLUCOPHAGE-XR) 500 MG 24 hr tablet TAKE 2 TABLETS BY MOUTH TWICE DAILY AND 1 TABLET AT NOON 150 tablet 5  . Multiple Vitamin (MULTIVITAMIN) tablet Take 1 tablet by mouth daily. Reported on 01/12/2016    . naproxen (NAPROSYN) 500 MG tablet Take 1 tablet (500 mg total) by mouth 2 (two) times daily as needed for mild pain or moderate pain (with food). 60 tablet 1  . nitroGLYCERIN (NITROSTAT) 0.4 MG SL tablet Place 1 tablet (0.4 mg total) under the tongue every 5 (five) minutes as needed for chest pain. 25 tablet 3  . NONFORMULARY OR COMPOUNDED ITEM [facility]  Onychomycosis Nail Lacquer -  Fluconazole 2%, Terbinafine 1% DMSO Apply to affected nail once daily Qty. 120 gm 3 refills    . pantoprazole (PROTONIX) 40 MG tablet TAKE 1 TABLET BY MOUTH ONCE DAILY 30 tablet 5  . potassium chloride SA (K-DUR,KLOR-CON) 20 MEQ tablet Take 1 tablet (20 mEq total) by mouth daily. 30 tablet 3  . promethazine  (PHENERGAN) 25 MG tablet Take 1 tablet (25 mg total) by mouth every 8 (eight) hours as needed for nausea or vomiting. 20 tablet 0  . tiZANidine (ZANAFLEX) 4 MG tablet Take 1 tablet (4 mg total) by mouth every 6 (six) hours as needed for muscle spasms. 40 tablet 1  . traMADol (ULTRAM) 50 MG tablet TAKE 2 TABLETS BY MOUTH TWICE DAILY AS NEEDED 60 tablet PRN  . UNIFINE PENTIPS 31G X 5 MM MISC USE AS  DIRECTED 100 each 2   No facility-administered medications prior to visit.     No Known Allergies  Review of Systems  Constitutional: Negative for fever and malaise/fatigue.  HENT: Negative for congestion.   Eyes: Negative for blurred vision.  Respiratory: Negative for shortness of breath.   Cardiovascular: Negative for chest pain, palpitations and leg swelling.  Gastrointestinal: Negative for abdominal pain, blood in stool and nausea.  Genitourinary: Negative for dysuria and frequency.  Musculoskeletal: Positive for joint pain and myalgias. Negative for falls.  Skin: Negative for rash.  Neurological: Negative for dizziness, loss of consciousness and headaches.  Endo/Heme/Allergies: Negative for environmental allergies.  Psychiatric/Behavioral: Negative for depression. The patient is not nervous/anxious.        Objective:    Physical Exam  Constitutional: She is oriented to person, place, and time. She appears well-developed and well-nourished. No distress.  HENT:  Head: Normocephalic and atraumatic.  Nose: Nose normal.  Eyes: Right eye exhibits no discharge. Left eye exhibits no discharge.  Neck: Normal range of motion. Neck supple.  Cardiovascular: Normal rate and regular rhythm.  No murmur heard. Pulmonary/Chest: Effort normal and breath sounds normal.  Abdominal: Soft. Bowel sounds are normal. There is no tenderness.  Musculoskeletal: She exhibits no edema.  Neurological: She is alert and oriented to person, place, and time.  Skin: Skin is warm and dry.  Psychiatric: She has a  normal mood and affect.  Nursing note and vitals reviewed.   BP 136/88 (BP Location: Left Arm, Patient Position: Sitting, Cuff Size: Normal)   Pulse 76   Temp 98 F (36.7 C) (Oral)   Resp 18   Wt 244 lb 12.8 oz (111 kg)   SpO2 98%   BMI 42.02 kg/m  Wt Readings from Last 3 Encounters:  08/03/17 244 lb 12.8 oz (111 kg)  04/27/17 241 lb 9.6 oz (109.6 kg)  12/01/16 236 lb (107 kg)   BP Readings from Last 3 Encounters:  08/03/17 136/88  04/27/17 122/82  11/24/16 116/80     Immunization History  Administered Date(s) Administered  . Influenza Whole 06/22/2010, 07/10/2012, 06/19/2013  . Influenza-Unspecified 06/19/2014, 06/20/2015  . Pneumococcal Conjugate-13 05/13/2016  . Td 08/19/2001    Health Maintenance  Topic Date Due  . PNEUMOCOCCAL POLYSACCHARIDE VACCINE (1) 10/31/1964  . COLONOSCOPY  10/31/2012  . URINE MICROALBUMIN  11/11/2016  . OPHTHALMOLOGY EXAM  10/27/2017  . FOOT EXAM  10/31/2017  . HEMOGLOBIN A1C  01/31/2018  . TETANUS/TDAP  03/03/2018  . MAMMOGRAM  03/17/2019  . INFLUENZA VACCINE  Completed  . Hepatitis C Screening  Completed  . HIV Screening  Completed    Lab Results  Component Value Date   WBC 11.4 (H) 08/03/2017   HGB 14.1 08/03/2017   HCT 44.0 08/03/2017   PLT 392.0 08/03/2017   GLUCOSE 82 08/03/2017   CHOL 173 08/03/2017   TRIG 237.0 (H) 08/03/2017   HDL 34.20 (L) 08/03/2017   LDLDIRECT 100.0 08/03/2017   LDLCALC 115 (H) 04/27/2017   ALT 16 08/03/2017   AST 16 08/03/2017   NA 141 08/03/2017   K 3.8 08/03/2017   CL 102 08/03/2017   CREATININE 0.74 08/03/2017   BUN 15 08/03/2017   CO2 32 08/03/2017   TSH 1.30 08/03/2017   HGBA1C 8.2 (H) 08/03/2017   MICROALBUR 0.9 11/12/2015    Lab Results  Component Value Date   TSH 1.30 08/03/2017   Lab Results  Component Value Date   WBC 11.4 (H) 08/03/2017   HGB  14.1 08/03/2017   HCT 44.0 08/03/2017   MCV 94.2 08/03/2017   PLT 392.0 08/03/2017   Lab Results  Component Value Date   NA  141 08/03/2017   K 3.8 08/03/2017   CO2 32 08/03/2017   GLUCOSE 82 08/03/2017   BUN 15 08/03/2017   CREATININE 0.74 08/03/2017   BILITOT 0.3 08/03/2017   ALKPHOS 87 08/03/2017   AST 16 08/03/2017   ALT 16 08/03/2017   PROT 7.0 08/03/2017   ALBUMIN 3.9 08/03/2017   CALCIUM 9.6 08/03/2017   GFR 104.88 08/03/2017   Lab Results  Component Value Date   CHOL 173 08/03/2017   Lab Results  Component Value Date   HDL 34.20 (L) 08/03/2017   Lab Results  Component Value Date   LDLCALC 115 (H) 04/27/2017   Lab Results  Component Value Date   TRIG 237.0 (H) 08/03/2017   Lab Results  Component Value Date   CHOLHDL 5 08/03/2017   Lab Results  Component Value Date   HGBA1C 8.2 (H) 08/03/2017         Assessment & Plan:   Problem List Items Addressed This Visit    Diabetes mellitus type 2 in obese ([place])    hgba1c acceptable, minimize simple carbs. Increase exercise as tolerated. Continue current meds      Relevant Orders   Hemoglobin A1c (Completed)   Hyperlipidemia, mixed    Tolerating statin, encouraged heart healthy diet, avoid trans fats, minimize simple carbs and saturated fats. Increase exercise as tolerated      Relevant Orders   Lipid panel (Completed)   Essential hypertension    Well controlled, no changes to meds. Encouraged heart healthy diet such as the DASH diet and exercise as tolerated.       Relevant Orders   CBC (Completed)   Comprehensive metabolic panel (Completed)   TSH (Completed)    Other Visit Diagnoses    Pain of left hip joint    -  Primary   Relevant Medications   methylPREDNISolone (MEDROL) 4 MG tablet   tiZANidine (ZANAFLEX) 4 MG tablet   Other Relevant Orders   Ambulatory referral to Sports Medicine      I am having Eileen Richards start on methylPREDNISolone and tiZANidine. I am also having her maintain her Lancet Device, multivitamin, aspirin, naproxen, promethazine, metFORMIN, carvedilol, meclizine, metFORMIN, nitroGLYCERIN,  albuterol, pantoprazole, glucose blood, ACCU-CHEK GUIDE, ACCU-CHEK FASTCLIX LANCETS, traMADol, tiZANidine, potassium chloride SA, liraglutide, carvedilol, glimepiride, hydrochlorothiazide, NONFORMULARY OR COMPOUNDED ITEM, atorvastatin, UNIFINE PENTIPS, INVOKANA, and JANUVIA.  Meds ordered this encounter  Medications  . methylPREDNISolone (MEDROL) 4 MG tablet    Sig: 5 tab po qd X 1d then 4 tab po qd X 1d then 3 tab po qd X 1d then 2 tab po qd then 1 tab po qd    Dispense:  15 tablet    Refill:  0  . tiZANidine (ZANAFLEX) 4 MG tablet    Sig: Take 1 tablet (4 mg total) every 6 (six) hours as needed by mouth for muscle spasms.    Dispense:  30 tablet    Refill:  0    CMA served as scribe during this visit. History, Physical and Plan performed by medical provider. Documentation and orders reviewed and attested to.  Penni Homans, MD

## 2017-08-03 NOTE — Assessment & Plan Note (Signed)
hgba1c acceptable, minimize simple carbs. Increase exercise as tolerated. Continue current meds 

## 2017-08-03 NOTE — Assessment & Plan Note (Signed)
Tolerating statin, encouraged heart healthy diet, avoid trans fats, minimize simple carbs and saturated fats. Increase exercise as tolerated 

## 2017-08-04 LAB — COMPREHENSIVE METABOLIC PANEL
ALT: 16 U/L (ref 0–35)
AST: 16 U/L (ref 0–37)
Albumin: 3.9 g/dL (ref 3.5–5.2)
Alkaline Phosphatase: 87 U/L (ref 39–117)
BUN: 15 mg/dL (ref 6–23)
CO2: 32 mEq/L (ref 19–32)
Calcium: 9.6 mg/dL (ref 8.4–10.5)
Chloride: 102 mEq/L (ref 96–112)
Creatinine, Ser: 0.74 mg/dL (ref 0.40–1.20)
GFR: 104.88 mL/min (ref >60.00)
Glucose, Bld: 82 mg/dL (ref 70–99)
Potassium: 3.8 mEq/L (ref 3.5–5.1)
Sodium: 141 mEq/L (ref 135–145)
Total Bilirubin: 0.3 mg/dL (ref 0.2–1.2)
Total Protein: 7 g/dL (ref 6.0–8.3)

## 2017-08-04 LAB — CBC
HCT: 44 % (ref 36.0–46.0)
Hemoglobin: 14.1 g/dL (ref 12.0–15.0)
MCHC: 32 g/dL (ref 30.0–36.0)
MCV: 94.2 fl (ref 78.0–100.0)
Platelets: 392 10*3/uL (ref 150.0–400.0)
RBC: 4.67 Mil/uL (ref 3.87–5.11)
RDW: 14.3 % (ref 11.5–15.5)
WBC: 11.4 10*3/uL — ABNORMAL HIGH (ref 4.0–10.5)

## 2017-08-04 LAB — LIPID PANEL
Cholesterol: 173 mg/dL (ref 0–200)
HDL: 34.2 mg/dL — ABNORMAL LOW (ref >39.00)
NonHDL: 138.49
Total CHOL/HDL Ratio: 5
Triglycerides: 237 mg/dL — ABNORMAL HIGH (ref 0.0–149.0)
VLDL: 47.4 mg/dL — ABNORMAL HIGH (ref 0.0–40.0)

## 2017-08-04 LAB — HEMOGLOBIN A1C: Hgb A1c MFr Bld: 8.2 % — ABNORMAL HIGH (ref 4.6–6.5)

## 2017-08-04 LAB — TSH: TSH: 1.3 u[IU]/mL (ref 0.35–4.50)

## 2017-08-04 LAB — LDL CHOLESTEROL, DIRECT: Direct LDL: 100 mg/dL

## 2017-08-08 ENCOUNTER — Encounter: Payer: Self-pay | Admitting: Family Medicine

## 2017-08-08 ENCOUNTER — Ambulatory Visit: Payer: 59 | Admitting: Family Medicine

## 2017-08-08 DIAGNOSIS — M25552 Pain in left hip: Secondary | ICD-10-CM

## 2017-08-08 NOTE — Patient Instructions (Signed)
You have piriformis syndrome with sciatica. Try to avoid painful activities when possible. Pick 2-3 stretches where you feel the pull in the area of pain - do 3 of these and hold for 20-30 seconds twice a day. Standing hip rotations and hip side raises 3 sets of 10 once a day. Add ankle with if these become too easy. Take the medicines Dr. Charlett Blake prescribed for you (medrol with tizanidine as needed). Consider tennis ball to massage area when sitting. Consider physical therapy. Follow up with me in 6 weeks but call me sooner if you're not improving as expected.

## 2017-08-13 ENCOUNTER — Encounter: Payer: Self-pay | Admitting: Family Medicine

## 2017-08-13 DIAGNOSIS — M25552 Pain in left hip: Secondary | ICD-10-CM | POA: Insufficient documentation

## 2017-08-13 DIAGNOSIS — M25559 Pain in unspecified hip: Secondary | ICD-10-CM | POA: Insufficient documentation

## 2017-08-13 NOTE — Assessment & Plan Note (Signed)
2/2 piriformis syndrome with sciatica.  Shown home exercises and stretches to do daily.  Encouraged to start taking the medrol dose pack with tizanidine as needed.  Consider physical therapy if not improving.  F/u in 6 weeks.

## 2017-08-13 NOTE — Progress Notes (Signed)
PCP: Mosie Lukes, MD  Subjective:   HPI: Patient is a 56 y.o. female here for left hip pain.  Patient reports she's had about 6 months of off and on posterior left hip pain. Pain radiates down to her knee. Pain level 2/10 but up to 10/10 and sharp. Worse by end of work day (works at [employer], 8 hour shifts, 6 days a week). No numbness or tingling. Prescribed medrol and tizanidine but hasn't started yet. Tried heat without much benefit. No bowel/bladder dysfunction.  Past Medical History:  Diagnosis Date  . Achilles tendonitis 10/14/2012  . Asthma    childhood  . Atypical chest pain    normal stress echo 02/2009 -LV size was normal LV global systolic function was normal- Normal wall motion; no LV regional wall motion abnormalities  . Benign paroxysmal positional vertigo 04/26/2015  . Chest pain, atypical 08/24/2013  . Diabetes mellitus   . GERD (gastroesophageal reflux disease)   . Hand pain, left 11/10/2015  . Hyperlipemia   . Hypertension   . Neuropathic pain of ankle, left 04/27/2017  . Onychomycosis 04/17/2014  . Pain of right heel 10/14/2012  . Preventative health care 11/22/2015  . Sinusitis, acute 01/17/2016  . Thoracic outlet syndrome 11/03/2014  . Urinary urgency 12/15/2015    Current Outpatient Medications on File Prior to Visit  Medication Sig Dispense Refill  . ACCU-CHEK FASTCLIX LANCETS MISC Use as directed twice daily to check blood sugar.  DX E11.9 102 each 6  . albuterol (PROVENTIL HFA;VENTOLIN HFA) 108 (90 Base) MCG/ACT inhaler Inhale 2 puffs into the lungs every 6 (six) hours as needed for wheezing or shortness of breath. 1 Inhaler 6  . aspirin 81 MG tablet Take 81 mg by mouth daily.    Marland Kitchen atorvastatin (LIPITOR) 20 MG tablet TAKE 1 TABLET (20 MG TOTAL) BY MOUTH DAILY. 90 tablet 3  . Blood Glucose Monitoring Suppl (ACCU-CHEK GUIDE) w/Device KIT Inject 1 kit as directed 2 (two) times daily. Use to check blood sugar.  DX E11.9 1 kit 0  . carvedilol (COREG) 3.125 MG tablet  TAKE 1 TABLET BY MOUTH 2 TIMES DAILY WITH A MEAL 60 tablet 6  . carvedilol (COREG) 3.125 MG tablet TAKE 1 TABLET BY MOUTH TWICE DAILY WITH MEALS 60 tablet 11  . glimepiride (AMARYL) 4 MG tablet TAKE 1 TABLET (4 MG TOTAL) BY MOUTH 2 (TWO) TIMES DAILY. 60 tablet 6  . glucose blood (ACCU-CHEK GUIDE) test strip Use as directed twice daily to check blood sugar.  DX E11.9 100 each 6  . hydrochlorothiazide (HYDRODIURIL) 25 MG tablet TAKE 1 TABLET (25 MG TOTAL) BY MOUTH DAILY. 90 tablet 5  . INVOKANA 100 MG TABS tablet TAKE 1 TABLET (100 MG TOTAL) BY MOUTH DAILY. 30 tablet 5  . JANUVIA 100 MG tablet TAKE 1 TABLET (100 MG TOTAL) BY MOUTH DAILY. 30 tablet 6  . Lancet Device MISC by Does not apply route. Use to test blood sugar three times daily     . liraglutide (VICTOZA) 18 MG/3ML SOPN Inject 0.3 mLs (1.8 mg total) into the skin daily. 6 mL 6  . meclizine (ANTIVERT) 25 MG tablet Take 1 tablet (25 mg total) by mouth 3 (three) times daily as needed for dizziness. 30 tablet 0  . metFORMIN (GLUCOPHAGE-XR) 500 MG 24 hr tablet TAKE 2 TABLETS BY MOUTH TWICE DAILY AND 1 TABLET AT NOON 150 tablet 5  . metFORMIN (GLUCOPHAGE-XR) 500 MG 24 hr tablet TAKE 2 TABLETS BY MOUTH TWICE DAILY AND  1 TABLET AT NOON 150 tablet 5  . methylPREDNISolone (MEDROL) 4 MG tablet 5 tab po qd X 1d then 4 tab po qd X 1d then 3 tab po qd X 1d then 2 tab po qd then 1 tab po qd 15 tablet 0  . Multiple Vitamin (MULTIVITAMIN) tablet Take 1 tablet by mouth daily. Reported on 01/12/2016    . naproxen (NAPROSYN) 500 MG tablet Take 1 tablet (500 mg total) by mouth 2 (two) times daily as needed for mild pain or moderate pain (with food). 60 tablet 1  . nitroGLYCERIN (NITROSTAT) 0.4 MG SL tablet Place 1 tablet (0.4 mg total) under the tongue every 5 (five) minutes as needed for chest pain. 25 tablet 3  . NONFORMULARY OR COMPOUNDED ITEM [facility]  Onychomycosis Nail Lacquer -  Fluconazole 2%, Terbinafine 1% DMSO Apply to affected nail once  daily Qty. 120 gm 3 refills    . pantoprazole (PROTONIX) 40 MG tablet TAKE 1 TABLET BY MOUTH ONCE DAILY 30 tablet 5  . potassium chloride SA (K-DUR,KLOR-CON) 20 MEQ tablet Take 1 tablet (20 mEq total) by mouth daily. 30 tablet 3  . promethazine (PHENERGAN) 25 MG tablet Take 1 tablet (25 mg total) by mouth every 8 (eight) hours as needed for nausea or vomiting. 20 tablet 0  . tiZANidine (ZANAFLEX) 4 MG tablet Take 1 tablet (4 mg total) by mouth every 6 (six) hours as needed for muscle spasms. 40 tablet 1  . tiZANidine (ZANAFLEX) 4 MG tablet Take 1 tablet (4 mg total) every 6 (six) hours as needed by mouth for muscle spasms. 30 tablet 0  . traMADol (ULTRAM) 50 MG tablet TAKE 2 TABLETS BY MOUTH TWICE DAILY AS NEEDED 60 tablet PRN  . UNIFINE PENTIPS 31G X 5 MM MISC USE AS DIRECTED 100 each 2   No current facility-administered medications on file prior to visit.     Past Surgical History:  Procedure Laterality Date  . ABDOMINAL HYSTERECTOMY    . CYSTOSCOPY  07/23/2012   Procedure: CYSTOSCOPY;  Surgeon: Marvene Staff, MD;  Location: [facility];  Service: Gynecology;  Laterality: N/A;  . DILATION AND CURETTAGE OF UTERUS     Lysis of Adhesions  . ROBOTIC ASSISTED LAPAROSCOPIC LYSIS OF ADHESION  07/23/2012   Procedure: ROBOTIC ASSISTED LAPAROSCOPIC LYSIS OF ADHESION;  Surgeon: Marvene Staff, MD;  Location: [facility];  Service: Gynecology;  Laterality: N/A;  . ROBOTIC ASSISTED SALPINGO OOPHERECTOMY  07/23/2012   Procedure: ROBOTIC ASSISTED SALPINGO OOPHERECTOMY;  Surgeon: Marvene Staff, MD;  Location: [facility];  Service: Gynecology;  Laterality: Bilateral;    No Known Allergies  Social History   Socioeconomic History  . Marital status: Single    Spouse name: Not on file  . Number of children: Not on file  . Years of education: Not on file  . Highest education level: Not on file  Social Needs  . Financial resource strain: Not on file  . Food insecurity - worry: Not on file  .  Food insecurity - inability: Not on file  . Transportation needs - medical: Not on file  . Transportation needs - non-medical: Not on file  Occupational History  . Not on file  Tobacco Use  . Smoking status: Former Research scientist (life sciences)  . Smokeless tobacco: Never Used  Substance and Sexual Activity  . Alcohol use: No  . Drug use: No  . Sexual activity: Not on file  Other Topics Concern  . Not on file  Social History  Narrative  . Not on file    Family History  Problem Relation Age of Onset  . GER disease Mother   . GER disease Father   . Heart disease Father        father has "heart problems"  . Anemia Father     BP (!) 162/95   Pulse 71   Ht _0  (1.626 m)   Wt 244 lb (110.7 kg)   BMI 41.88 kg/m   Review of Systems: See HPI above.     Objective:  Physical Exam:  Gen: NAD, comfortable in exam room  Back: No gross deformity, scoliosis. No TTP .  No midline or bony TTP. FROM. Strength LEs 5/5 all muscle groups except 4/5 left hip abduction.   2+ MSRs in patellar and achilles tendons, equal bilaterally. Negative SLRs. Sensation intact to light touch bilaterally.  Left hip: No deformity. TTP over hip external rotators.  No trochanter, other tenderness. FROM hip with 4/5 strength hip abduction, 5/5 other motions. Negative logroll Negative fabers  Positive piriformis stretch. NVI distally.  Assessment & Plan:  1. Left hip pain - 2/2 piriformis syndrome with sciatica.  Shown home exercises and stretches to do daily.  Encouraged to start taking the medrol dose pack with tizanidine as needed.  Consider physical therapy if not improving.  F/u in 6 weeks.

## 2017-08-17 ENCOUNTER — Telehealth: Payer: Self-pay

## 2017-08-17 NOTE — Telephone Encounter (Signed)
Copied from [place]. Topic: General - Other >> Aug 17, 2017  9:11 AM Damita Dunnings, CMA wrote: Reason for CRM: PA for Invokana 100mg  initiated via Covermymeds; KEY: [id number]. Awaiting determination.

## 2017-08-18 MED FILL — INVOKANA 100 MG TABLET: 100 | 30 days supply | Qty: 30 | Fill #3

## 2017-08-18 MED FILL — JANUVIA 100 MG TABLET: 100 | 30 days supply | Qty: 30 | Fill #2

## 2017-08-20 ENCOUNTER — Other Ambulatory Visit: Payer: Self-pay | Admitting: Family Medicine

## 2017-08-21 ENCOUNTER — Telehealth: Payer: Self-pay

## 2017-08-21 ENCOUNTER — Other Ambulatory Visit: Payer: Self-pay | Admitting: Family Medicine

## 2017-08-21 MED ORDER — DAPAGLIFLOZIN PROPANEDIOL 5 MG PO TABS: 5.0000 mg | ORAL_TABLET | Freq: Every day | ORAL | 3 refills | Status: DC

## 2017-08-21 NOTE — Telephone Encounter (Signed)
Rx for Farxiga 5 mg # 30 with 0 RF sent to pharmacy per order from Dr. Charlett Blake. Patient notified via answering machine.

## 2017-08-21 NOTE — Progress Notes (Signed)
Rx for Farxiga 5 mg # 30 with 3 RF sent to pharmacy. Informed patient via answering machine.

## 2017-08-21 NOTE — Telephone Encounter (Signed)
We can try Farxiga 5 mg po daily disp #30 with 3 rf and see how she does, can increase to 10 mg in future if response is not vigorous enough

## 2017-08-21 NOTE — Telephone Encounter (Signed)
PA denied. Invokana requires step therapy. Pt must try and fail Elsie Stain, Synjardy XR, Farxiga, or Xigduo XR. Please advise.

## 2017-08-21 NOTE — Telephone Encounter (Signed)
Copied from [place] [id number]. Topic: General - Other >> Aug 21, 2017 11:40 AM Damita Dunnings, CMA wrote: Reason for CRM: PA initiated for Victoza via Covermymeds; KEY: [id number]. Awaiting determination.

## 2017-08-22 MED ORDER — MECLIZINE HCL 25 MG PO TABS: 25.0000 mg | ORAL_TABLET | Freq: Three times a day (TID) | ORAL | 0 refills | Status: DC | PRN

## 2017-08-22 MED FILL — MECLIZINE 25 MG TABLET: 25 | 10 days supply | Qty: 30 | Fill #0

## 2017-08-23 ENCOUNTER — Other Ambulatory Visit: Payer: Self-pay | Admitting: *Deleted

## 2017-08-23 MED FILL — VICTOZA 18 MG/3 ML INJECT P: 18 | 30 days supply | Qty: 9 | Fill #4

## 2017-08-23 NOTE — Telephone Encounter (Signed)
PA approved.

## 2017-08-23 NOTE — Patient Outreach (Signed)
Eileen Richards transitioned from the [employer] [facility] program to the [employer] on 09/29/16 for Type II diabetes self-management assistance so will close case to the diabetes [facility] program due to delegation of disease management services to [employer] from [employer] for [place] members in 2019. Barrington Ellison RN,CCM,CDE [place] Management Coordinator [facility] and [employer] [phone] Office Fax [phone]

## 2017-09-08 MED FILL — METFORMIN HCL ER 500 MG TAB: 500 | 30 days supply | Qty: 150 | Fill #4

## 2017-09-08 MED FILL — GLIMEPIRIDE 4 MG TABLET: 4 | 30 days supply | Qty: 60 | Fill #5

## 2017-09-08 MED FILL — CARVEDILOL 3.125 MG TABLET: 3.125 | 30 days supply | Qty: 60 | Fill #4

## 2017-09-11 MED FILL — INVOKANA 100 MG TABLET: 100 | 60 days supply | Qty: 60 | Fill #4

## 2017-09-21 ENCOUNTER — Ambulatory Visit: Payer: Self-pay | Admitting: Family Medicine

## 2017-10-09 MED FILL — JANUVIA 100 MG TABLET: 100 | 30 days supply | Qty: 30 | Fill #3

## 2017-10-26 ENCOUNTER — Ambulatory Visit: Payer: 59 | Admitting: Family Medicine

## 2017-10-26 ENCOUNTER — Encounter: Payer: Self-pay | Admitting: Family Medicine

## 2017-10-26 DIAGNOSIS — M7661 Achilles tendinitis, right leg: Secondary | ICD-10-CM | POA: Diagnosis not present

## 2017-10-26 NOTE — Patient Instructions (Signed)
You have Achilles Tendinopathy Ibuprofen 600mg  three times a day with food instead of the naproxen. Calf raises 3 sets of 10 on level ground once a day first. When these are easy, can do them one legged 3 sets of 10. Finally advance to doing them on a step. Can add heel walks, toe walks forward and backward as well Icing 15 minutes at a time 3-4 times a day. Avoid uneven ground, hills as much as possible. Heel lifts in shoes or shoes with a natural heel lift. Consider physical therapy, orthotics, nitro patches if not improving as expected. Follow up in 6 weeks.

## 2017-10-29 ENCOUNTER — Encounter: Payer: Self-pay | Admitting: Family Medicine

## 2017-10-29 NOTE — Assessment & Plan Note (Signed)
Ibuprofen instead of naproxen.  Shown home exercise program and how to advance this.  Icing.  Avoid uneven ground, hills.  Heel lifts.  Consider PT, orthotics, nitro patches.  F/u in 6 weeks.

## 2017-10-29 NOTE — Progress Notes (Signed)
PCP: Mosie Lukes, MD  Subjective:   HPI: Patient is a 57 y.o. female here for right heel pain.  Patient reports she started to get pain lateral right foot/heel about a month ago. No acute injury though she did fall a few months ago (improved completely though). She did have problems with achilles a few years ago but improved. Has tried icing, naproxen. Pain level 4-5/10 and sharp. Worse with walking. No skin changes, numbness.  Past Medical History:  Diagnosis Date  . Achilles tendonitis 10/14/2012  . Asthma    childhood  . Atypical chest pain    normal stress echo 02/2009 -LV size was normal LV global systolic function was normal- Normal wall motion; no LV regional wall motion abnormalities  . Benign paroxysmal positional vertigo 04/26/2015  . Chest pain, atypical 08/24/2013  . Diabetes mellitus   . GERD (gastroesophageal reflux disease)   . Hand pain, left 11/10/2015  . Hyperlipemia   . Hypertension   . Neuropathic pain of ankle, left 04/27/2017  . Onychomycosis 04/17/2014  . Pain of right heel 10/14/2012  . Preventative health care 11/22/2015  . Sinusitis, acute 01/17/2016  . Thoracic outlet syndrome 11/03/2014  . Urinary urgency 12/15/2015    Current Outpatient Medications on File Prior to Visit  Medication Sig Dispense Refill  . ACCU-CHEK FASTCLIX LANCETS MISC Use as directed twice daily to check blood sugar.  DX E11.9 102 each 6  . albuterol (PROVENTIL HFA;VENTOLIN HFA) 108 (90 Base) MCG/ACT inhaler Inhale 2 puffs into the lungs every 6 (six) hours as needed for wheezing or shortness of breath. 1 Inhaler 6  . aspirin 81 MG tablet Take 81 mg by mouth daily.    Marland Kitchen atorvastatin (LIPITOR) 20 MG tablet TAKE 1 TABLET (20 MG TOTAL) BY MOUTH DAILY. 90 tablet 3  . Blood Glucose Monitoring Suppl (ACCU-CHEK GUIDE) w/Device KIT Inject 1 kit as directed 2 (two) times daily. Use to check blood sugar.  DX E11.9 1 kit 0  . carvedilol (COREG) 3.125 MG tablet TAKE 1 TABLET BY MOUTH 2 TIMES DAILY  WITH A MEAL 60 tablet 6  . carvedilol (COREG) 3.125 MG tablet TAKE 1 TABLET BY MOUTH TWICE DAILY WITH MEALS 60 tablet 11  . dapagliflozin propanediol (FARXIGA) 5 MG TABS tablet Take 5 mg by mouth daily. 30 tablet 3  . glimepiride (AMARYL) 4 MG tablet TAKE 1 TABLET (4 MG TOTAL) BY MOUTH 2 (TWO) TIMES DAILY. 60 tablet 6  . glucose blood (ACCU-CHEK GUIDE) test strip Use as directed twice daily to check blood sugar.  DX E11.9 100 each 6  . hydrochlorothiazide (HYDRODIURIL) 25 MG tablet TAKE 1 TABLET (25 MG TOTAL) BY MOUTH DAILY. 90 tablet 5  . INVOKANA 100 MG TABS tablet TAKE 1 TABLET (100 MG TOTAL) BY MOUTH DAILY. 30 tablet 5  . JANUVIA 100 MG tablet TAKE 1 TABLET (100 MG TOTAL) BY MOUTH DAILY. 30 tablet 6  . Lancet Device MISC by Does not apply route. Use to test blood sugar three times daily     . liraglutide (VICTOZA) 18 MG/3ML SOPN Inject 0.3 mLs (1.8 mg total) into the skin daily. 6 mL 6  . meclizine (ANTIVERT) 25 MG tablet Take 1 tablet (25 mg total) by mouth 3 (three) times daily as needed for dizziness. 30 tablet 0  . metFORMIN (GLUCOPHAGE-XR) 500 MG 24 hr tablet TAKE 2 TABLETS BY MOUTH TWICE DAILY AND 1 TABLET AT NOON 150 tablet 5  . metFORMIN (GLUCOPHAGE-XR) 500 MG 24 hr tablet  TAKE 2 TABLETS BY MOUTH TWICE DAILY AND 1 TABLET AT NOON 150 tablet 5  . methylPREDNISolone (MEDROL) 4 MG tablet 5 tab po qd X 1d then 4 tab po qd X 1d then 3 tab po qd X 1d then 2 tab po qd then 1 tab po qd 15 tablet 0  . Multiple Vitamin (MULTIVITAMIN) tablet Take 1 tablet by mouth daily. Reported on 01/12/2016    . naproxen (NAPROSYN) 500 MG tablet Take 1 tablet (500 mg total) by mouth 2 (two) times daily as needed for mild pain or moderate pain (with food). 60 tablet 1  . nitroGLYCERIN (NITROSTAT) 0.4 MG SL tablet Place 1 tablet (0.4 mg total) under the tongue every 5 (five) minutes as needed for chest pain. 25 tablet 3  . NONFORMULARY OR COMPOUNDED ITEM [facility]  Onychomycosis Nail Lacquer -  Fluconazole  2%, Terbinafine 1% DMSO Apply to affected nail once daily Qty. 120 gm 3 refills    . pantoprazole (PROTONIX) 40 MG tablet TAKE 1 TABLET BY MOUTH ONCE DAILY 30 tablet 5  . potassium chloride SA (K-DUR,KLOR-CON) 20 MEQ tablet Take 1 tablet (20 mEq total) by mouth daily. 30 tablet 3  . promethazine (PHENERGAN) 25 MG tablet Take 1 tablet (25 mg total) by mouth every 8 (eight) hours as needed for nausea or vomiting. 20 tablet 0  . tiZANidine (ZANAFLEX) 4 MG tablet Take 1 tablet (4 mg total) by mouth every 6 (six) hours as needed for muscle spasms. 40 tablet 1  . tiZANidine (ZANAFLEX) 4 MG tablet Take 1 tablet (4 mg total) every 6 (six) hours as needed by mouth for muscle spasms. 30 tablet 0  . traMADol (ULTRAM) 50 MG tablet TAKE 2 TABLETS BY MOUTH TWICE DAILY AS NEEDED 60 tablet PRN  . UNIFINE PENTIPS 31G X 5 MM MISC USE AS DIRECTED 100 each 2   No current facility-administered medications on file prior to visit.     Past Surgical History:  Procedure Laterality Date  . ABDOMINAL HYSTERECTOMY    . CYSTOSCOPY  07/23/2012   Procedure: CYSTOSCOPY;  Surgeon: Marvene Staff, MD;  Location: [facility];  Service: Gynecology;  Laterality: N/A;  . DILATION AND CURETTAGE OF UTERUS     Lysis of Adhesions  . ROBOTIC ASSISTED LAPAROSCOPIC LYSIS OF ADHESION  07/23/2012   Procedure: ROBOTIC ASSISTED LAPAROSCOPIC LYSIS OF ADHESION;  Surgeon: Marvene Staff, MD;  Location: [facility];  Service: Gynecology;  Laterality: N/A;  . ROBOTIC ASSISTED SALPINGO OOPHERECTOMY  07/23/2012   Procedure: ROBOTIC ASSISTED SALPINGO OOPHERECTOMY;  Surgeon: Marvene Staff, MD;  Location: [facility];  Service: Gynecology;  Laterality: Bilateral;    No Known Allergies  Social History   Socioeconomic History  . Marital status: Single    Spouse name: Not on file  . Number of children: Not on file  . Years of education: Not on file  . Highest education level: Not on file  Social Needs  . Financial resource strain: Not on  file  . Food insecurity - worry: Not on file  . Food insecurity - inability: Not on file  . Transportation needs - medical: Not on file  . Transportation needs - non-medical: Not on file  Occupational History  . Not on file  Tobacco Use  . Smoking status: Former Research scientist (life sciences)  . Smokeless tobacco: Never Used  Substance and Sexual Activity  . Alcohol use: No  . Drug use: No  . Sexual activity: Not on file  Other Topics Concern  .  Not on file  Social History Narrative  . Not on file    Family History  Problem Relation Age of Onset  . GER disease Mother   . GER disease Father   . Heart disease Father        father has "heart problems"  . Anemia Father     BP (!) 157/78   Pulse 85   Ht _0  (1.626 m)   Wt 238 lb (108 kg)   BMI 40.85 kg/m   Review of Systems: See HPI above.     Objective:  Physical Exam:  Gen: NAD, comfortable in exam room  Right foot/ankle: No gross deformity, swelling, ecchymoses FROM with 5/5 strength.  Pain on dorsiflexion. TTP insertion of achilles on lateral aspect of calcaneus.  No other tenderness. Negative calcaneal squeeze. Negative ant drawer and talar tilt.   Negative syndesmotic compression. Thompsons test negative. NV intact distally.  Left foot/ankle: No deformity. FROM with 5/5 strength. No tenderness to palpation. NVI distally.   Assessment & Plan:  1. Right insertional achilles tendinopathy - Ibuprofen instead of naproxen.  Shown home exercise program and how to advance this.  Icing.  Avoid uneven ground, hills.  Heel lifts.  Consider PT, orthotics, nitro patches.  F/u in 6 weeks.

## 2017-10-31 DIAGNOSIS — H40033 Anatomical narrow angle, bilateral: Secondary | ICD-10-CM | POA: Diagnosis not present

## 2017-10-31 DIAGNOSIS — H04123 Dry eye syndrome of bilateral lacrimal glands: Secondary | ICD-10-CM | POA: Diagnosis not present

## 2017-10-31 DIAGNOSIS — H40023 Open angle with borderline findings, high risk, bilateral: Secondary | ICD-10-CM | POA: Diagnosis not present

## 2017-10-31 DIAGNOSIS — H5202 Hypermetropia, left eye: Secondary | ICD-10-CM | POA: Diagnosis not present

## 2017-10-31 DIAGNOSIS — H18413 Arcus senilis, bilateral: Secondary | ICD-10-CM | POA: Diagnosis not present

## 2017-10-31 DIAGNOSIS — H3589 Other specified retinal disorders: Secondary | ICD-10-CM | POA: Diagnosis not present

## 2017-10-31 DIAGNOSIS — H52223 Regular astigmatism, bilateral: Secondary | ICD-10-CM | POA: Diagnosis not present

## 2017-10-31 DIAGNOSIS — H25013 Cortical age-related cataract, bilateral: Secondary | ICD-10-CM | POA: Diagnosis not present

## 2017-10-31 DIAGNOSIS — H11823 Conjunctivochalasis, bilateral: Secondary | ICD-10-CM | POA: Diagnosis not present

## 2017-11-02 ENCOUNTER — Ambulatory Visit (INDEPENDENT_AMBULATORY_CARE_PROVIDER_SITE_OTHER): Payer: 59 | Admitting: Family Medicine

## 2017-11-02 VITALS — BP 130/86 | HR 75 | Temp 97.4°F | Resp 18 | Ht 64.0 in | Wt 238.0 lb

## 2017-11-02 DIAGNOSIS — E6609 Other obesity due to excess calories: Secondary | ICD-10-CM

## 2017-11-02 DIAGNOSIS — E782 Mixed hyperlipidemia: Secondary | ICD-10-CM | POA: Diagnosis not present

## 2017-11-02 DIAGNOSIS — Z0000 Encounter for general adult medical examination without abnormal findings: Secondary | ICD-10-CM | POA: Diagnosis not present

## 2017-11-02 DIAGNOSIS — E669 Obesity, unspecified: Secondary | ICD-10-CM | POA: Diagnosis not present

## 2017-11-02 DIAGNOSIS — H8110 Benign paroxysmal vertigo, unspecified ear: Secondary | ICD-10-CM | POA: Diagnosis not present

## 2017-11-02 DIAGNOSIS — M7661 Achilles tendinitis, right leg: Secondary | ICD-10-CM | POA: Diagnosis not present

## 2017-11-02 DIAGNOSIS — E1169 Type 2 diabetes mellitus with other specified complication: Secondary | ICD-10-CM

## 2017-11-02 DIAGNOSIS — H539 Unspecified visual disturbance: Secondary | ICD-10-CM | POA: Insufficient documentation

## 2017-11-02 MED ORDER — PROMETHAZINE HCL 25 MG PO TABS: 25.0000 mg | ORAL_TABLET | Freq: Three times a day (TID) | ORAL | 0 refills | Status: DC | PRN

## 2017-11-02 MED ORDER — LIRAGLUTIDE 18 MG/3ML ~~LOC~~ SOPN: 1.8000 mg | PEN_INJECTOR | Freq: Every day | SUBCUTANEOUS | 6 refills | Status: DC

## 2017-11-02 MED ORDER — CARVEDILOL 3.125 MG PO TABS: ORAL_TABLET | ORAL | 6 refills | Status: DC

## 2017-11-02 MED ORDER — MECLIZINE HCL 25 MG PO TABS: 25.0000 mg | ORAL_TABLET | Freq: Three times a day (TID) | ORAL | 0 refills | Status: DC | PRN

## 2017-11-02 MED FILL — PROMETHAZINE 25 MG TABLET: 25 | 6 days supply | Qty: 20 | Fill #0

## 2017-11-02 MED FILL — CARVEDILOL 3.125 MG TABLET: 3.125 | 30 days supply | Qty: 60 | Fill #0

## 2017-11-02 MED FILL — VICTOZA 18 MG/3 ML INJECT P: 18 | 30 days supply | Qty: 9 | Fill #0

## 2017-11-02 MED FILL — MECLIZINE 25 MG TABLET: 25 | 10 days supply | Qty: 30 | Fill #0

## 2017-11-02 NOTE — Assessment & Plan Note (Addendum)
hgba1c unacceptable, minimize simple carbs. Increase exercise as tolerated. Continue current meds increase insulin dosing

## 2017-11-02 NOTE — Assessment & Plan Note (Signed)
Has cataracts and possibly glaucoma was seeing Dr Liam Graham, optometry but they discovered cataracts and glaucoma and they are referring her to an opthamologist

## 2017-11-02 NOTE — Progress Notes (Signed)
Subjective:  I acted as a Education administrator for Dr. Charlett Blake. [place], [place]  Patient ID: Eileen Richards, female    DOB: June 06, 1961, 57 y.o.   MRN: [id number]  No chief complaint on file.   HPI  Patient is in today for an annual exam and follow up on Diabetes, hypertension, hyperlipidemia and more. She has been struggling with a painful right achilles tendon and is following with Sports Medicine. She also notes she continues to have frequent vertigo when she moves she has a sense of the room spinning. No associated tinnitus or hearing loss. No recent trauma, fall or syncope. No polyuria or polydipsia. But she does note her sugars have been high latelya nd she has not consistently following a diabetic diet. She reports she is doing well with her activities of daily living. Denies CP/palp/SOB/HA/congestion/fevers/GI or GU c/o. Taking meds as prescribed  Patient Care Team: Mosie Lukes, MD as PCP - General (Family Medicine) Servando Salina, MD as Consulting Physician (Obstetrics and Gynecology) Juanita Craver, MD as Consulting Physician (Gastroenterology)   Past Medical History:  Diagnosis Date  . Achilles tendonitis 10/14/2012  . Asthma    childhood  . Atypical chest pain    normal stress echo 02/2009 -LV size was normal LV global systolic function was normal- Normal wall motion; no LV regional wall motion abnormalities  . Benign paroxysmal positional vertigo 04/26/2015  . Chest pain, atypical 08/24/2013  . Diabetes mellitus   . GERD (gastroesophageal reflux disease)   . Hand pain, left 11/10/2015  . Hyperlipemia   . Hypertension   . Neuropathic pain of ankle, left 04/27/2017  . Onychomycosis 04/17/2014  . Pain of right heel 10/14/2012  . Preventative health care 11/22/2015  . Sinusitis, acute 01/17/2016  . Thoracic outlet syndrome 11/03/2014  . Urinary urgency 12/15/2015    Past Surgical History:  Procedure Laterality Date  . ABDOMINAL HYSTERECTOMY    . CYSTOSCOPY  07/23/2012   Procedure:  CYSTOSCOPY;  Surgeon: Marvene Staff, MD;  Location: [facility];  Service: Gynecology;  Laterality: N/A;  . DILATION AND CURETTAGE OF UTERUS     Lysis of Adhesions  . ROBOTIC ASSISTED LAPAROSCOPIC LYSIS OF ADHESION  07/23/2012   Procedure: ROBOTIC ASSISTED LAPAROSCOPIC LYSIS OF ADHESION;  Surgeon: Marvene Staff, MD;  Location: [facility];  Service: Gynecology;  Laterality: N/A;  . ROBOTIC ASSISTED SALPINGO OOPHERECTOMY  07/23/2012   Procedure: ROBOTIC ASSISTED SALPINGO OOPHERECTOMY;  Surgeon: Marvene Staff, MD;  Location: [facility];  Service: Gynecology;  Laterality: Bilateral;    Family History  Problem Relation Age of Onset  . GER disease Mother   . GER disease Father   . Heart disease Father        father has "heart problems"  . Anemia Father     Social History   Socioeconomic History  . Marital status: Single    Spouse name: Not on file  . Number of children: Not on file  . Years of education: Not on file  . Highest education level: Not on file  Social Needs  . Financial resource strain: Not on file  . Food insecurity - worry: Not on file  . Food insecurity - inability: Not on file  . Transportation needs - medical: Not on file  . Transportation needs - non-medical: Not on file  Occupational History  . Not on file  Tobacco Use  . Smoking status: Former Research scientist (life sciences)  . Smokeless tobacco: Never Used  Substance and Sexual Activity  . Alcohol  use: No  . Drug use: No  . Sexual activity: Not on file  Other Topics Concern  . Not on file  Social History Narrative  . Not on file    Outpatient Medications Prior to Visit  Medication Sig Dispense Refill  . ACCU-CHEK FASTCLIX LANCETS MISC Use as directed twice daily to check blood sugar.  DX E11.9 102 each 6  . albuterol (PROVENTIL HFA;VENTOLIN HFA) 108 (90 Base) MCG/ACT inhaler Inhale 2 puffs into the lungs every 6 (six) hours as needed for wheezing or shortness of breath. 1 Inhaler 6  . aspirin 81 MG tablet Take 81 mg by  mouth daily.    Marland Kitchen atorvastatin (LIPITOR) 20 MG tablet TAKE 1 TABLET (20 MG TOTAL) BY MOUTH DAILY. 90 tablet 3  . Blood Glucose Monitoring Suppl (ACCU-CHEK GUIDE) w/Device KIT Inject 1 kit as directed 2 (two) times daily. Use to check blood sugar.  DX E11.9 1 kit 0  . carvedilol (COREG) 3.125 MG tablet TAKE 1 TABLET BY MOUTH TWICE DAILY WITH MEALS 60 tablet 11  . dapagliflozin propanediol (FARXIGA) 5 MG TABS tablet Take 5 mg by mouth daily. 30 tablet 3  . glimepiride (AMARYL) 4 MG tablet TAKE 1 TABLET (4 MG TOTAL) BY MOUTH 2 (TWO) TIMES DAILY. 60 tablet 6  . glucose blood (ACCU-CHEK GUIDE) test strip Use as directed twice daily to check blood sugar.  DX E11.9 100 each 6  . hydrochlorothiazide (HYDRODIURIL) 25 MG tablet TAKE 1 TABLET (25 MG TOTAL) BY MOUTH DAILY. 90 tablet 5  . JANUVIA 100 MG tablet TAKE 1 TABLET (100 MG TOTAL) BY MOUTH DAILY. 30 tablet 6  . Lancet Device MISC by Does not apply route. Use to test blood sugar three times daily     . metFORMIN (GLUCOPHAGE-XR) 500 MG 24 hr tablet TAKE 2 TABLETS BY MOUTH TWICE DAILY AND 1 TABLET AT NOON 150 tablet 5  . methylPREDNISolone (MEDROL) 4 MG tablet 5 tab po qd X 1d then 4 tab po qd X 1d then 3 tab po qd X 1d then 2 tab po qd then 1 tab po qd 15 tablet 0  . Multiple Vitamin (MULTIVITAMIN) tablet Take 1 tablet by mouth daily. Reported on 01/12/2016    . naproxen (NAPROSYN) 500 MG tablet Take 1 tablet (500 mg total) by mouth 2 (two) times daily as needed for mild pain or moderate pain (with food). 60 tablet 1  . nitroGLYCERIN (NITROSTAT) 0.4 MG SL tablet Place 1 tablet (0.4 mg total) under the tongue every 5 (five) minutes as needed for chest pain. 25 tablet 3  . potassium chloride SA (K-DUR,KLOR-CON) 20 MEQ tablet Take 1 tablet (20 mEq total) by mouth daily. 30 tablet 3  . tiZANidine (ZANAFLEX) 4 MG tablet Take 1 tablet (4 mg total) by mouth every 6 (six) hours as needed for muscle spasms. 40 tablet 1  . traMADol (ULTRAM) 50 MG tablet TAKE 2  TABLETS BY MOUTH TWICE DAILY AS NEEDED 60 tablet PRN  . UNIFINE PENTIPS 31G X 5 MM MISC USE AS DIRECTED 100 each 2  . carvedilol (COREG) 3.125 MG tablet TAKE 1 TABLET BY MOUTH 2 TIMES DAILY WITH A MEAL 60 tablet 6  . INVOKANA 100 MG TABS tablet TAKE 1 TABLET (100 MG TOTAL) BY MOUTH DAILY. 30 tablet 5  . liraglutide (VICTOZA) 18 MG/3ML SOPN Inject 0.3 mLs (1.8 mg total) into the skin daily. 6 mL 6  . meclizine (ANTIVERT) 25 MG tablet Take 1 tablet (25 mg total)  by mouth 3 (three) times daily as needed for dizziness. 30 tablet 0  . metFORMIN (GLUCOPHAGE-XR) 500 MG 24 hr tablet TAKE 2 TABLETS BY MOUTH TWICE DAILY AND 1 TABLET AT NOON 150 tablet 5  . NONFORMULARY OR COMPOUNDED ITEM [facility]  Onychomycosis Nail Lacquer -  Fluconazole 2%, Terbinafine 1% DMSO Apply to affected nail once daily Qty. 120 gm 3 refills    . pantoprazole (PROTONIX) 40 MG tablet TAKE 1 TABLET BY MOUTH ONCE DAILY 30 tablet 5  . promethazine (PHENERGAN) 25 MG tablet Take 1 tablet (25 mg total) by mouth every 8 (eight) hours as needed for nausea or vomiting. 20 tablet 0  . tiZANidine (ZANAFLEX) 4 MG tablet Take 1 tablet (4 mg total) every 6 (six) hours as needed by mouth for muscle spasms. 30 tablet 0   No facility-administered medications prior to visit.     No Known Allergies  Review of Systems  Constitutional: Positive for malaise/fatigue. Negative for fever.  HENT: Negative for congestion.   Eyes: Negative for blurred vision.  Respiratory: Negative for cough and shortness of breath.   Cardiovascular: Negative for chest pain, palpitations and leg swelling.  Gastrointestinal: Negative for vomiting.  Musculoskeletal: Positive for back pain, joint pain and myalgias.  Skin: Negative for rash.  Neurological: Positive for dizziness. Negative for loss of consciousness and headaches.  Psychiatric/Behavioral: The patient is nervous/anxious.        Objective:    Physical Exam  Constitutional: She is oriented  to person, place, and time. She appears well-developed and well-nourished. No distress.  HENT:  Head: Normocephalic and atraumatic.  Eyes: Conjunctivae are normal.  Neck: Normal range of motion. No thyromegaly present.  Cardiovascular: Normal rate and regular rhythm.  Pulmonary/Chest: Effort normal and breath sounds normal. She has no wheezes.  Abdominal: Soft. Bowel sounds are normal. There is no tenderness.  Musculoskeletal: Normal range of motion. She exhibits no edema or deformity.  Neurological: She is alert and oriented to person, place, and time.  Skin: Skin is warm and dry. She is not diaphoretic.  Psychiatric: She has a normal mood and affect.    BP 130/86 (BP Location: Left Arm, Patient Position: Sitting, Cuff Size: Normal)   Pulse 75   Temp (!) 97.4 F (36.3 C) (Oral)   Resp 18   Ht _0  (1.626 m)   Wt 238 lb (108 kg)   SpO2 97%   BMI 40.85 kg/m  Wt Readings from Last 3 Encounters:  11/02/17 238 lb (108 kg)  10/26/17 238 lb (108 kg)  08/08/17 244 lb (110.7 kg)   BP Readings from Last 3 Encounters:  11/02/17 130/86  10/26/17 (!) 157/78  08/08/17 (!) 162/95     Immunization History  Administered Date(s) Administered  . Influenza Whole 06/22/2010, 07/10/2012, 06/19/2013  . Influenza-Unspecified 06/19/2014, 06/20/2015  . Pneumococcal Conjugate-13 05/13/2016  . Td 08/19/2001    Health Maintenance  Topic Date Due  . PNEUMOCOCCAL POLYSACCHARIDE VACCINE (1) 10/31/1964  . COLONOSCOPY  10/31/2012  . URINE MICROALBUMIN  11/11/2016  . OPHTHALMOLOGY EXAM  10/27/2017  . FOOT EXAM  10/31/2017  . TETANUS/TDAP  03/03/2018  . HEMOGLOBIN A1C  05/02/2018  . MAMMOGRAM  03/17/2019  . INFLUENZA VACCINE  Completed  . Hepatitis C Screening  Completed  . HIV Screening  Completed    Lab Results  Component Value Date   WBC 10.4 11/02/2017   HGB 14.9 11/02/2017   HCT 45.9 11/02/2017   PLT 412.0 (H) 11/02/2017  GLUCOSE 207 (H) 11/02/2017   CHOL 213 (H) 11/02/2017    TRIG 117.0 11/02/2017   HDL 37.30 (L) 11/02/2017   LDLDIRECT 100.0 08/03/2017   LDLCALC 153 (H) 11/02/2017   ALT 14 11/02/2017   AST 14 11/02/2017   NA 137 11/02/2017   K 4.3 11/02/2017   CL 99 11/02/2017   CREATININE 0.74 11/02/2017   BUN 16 11/02/2017   CO2 30 11/02/2017   TSH 1.15 11/02/2017   HGBA1C 9.0 (H) 11/02/2017   MICROALBUR 0.9 11/12/2015    Lab Results  Component Value Date   TSH 1.15 11/02/2017   Lab Results  Component Value Date   WBC 10.4 11/02/2017   HGB 14.9 11/02/2017   HCT 45.9 11/02/2017   MCV 92.9 11/02/2017   PLT 412.0 (H) 11/02/2017   Lab Results  Component Value Date   NA 137 11/02/2017   K 4.3 11/02/2017   CO2 30 11/02/2017   GLUCOSE 207 (H) 11/02/2017   BUN 16 11/02/2017   CREATININE 0.74 11/02/2017   BILITOT 0.5 11/02/2017   ALKPHOS 90 11/02/2017   AST 14 11/02/2017   ALT 14 11/02/2017   PROT 7.6 11/02/2017   ALBUMIN 4.2 11/02/2017   CALCIUM 9.6 11/02/2017   GFR 104.79 11/02/2017   Lab Results  Component Value Date   CHOL 213 (H) 11/02/2017   Lab Results  Component Value Date   HDL 37.30 (L) 11/02/2017   Lab Results  Component Value Date   LDLCALC 153 (H) 11/02/2017   Lab Results  Component Value Date   TRIG 117.0 11/02/2017   Lab Results  Component Value Date   CHOLHDL 6 11/02/2017   Lab Results  Component Value Date   HGBA1C 9.0 (H) 11/02/2017         Assessment & Plan:   Problem List Items Addressed This Visit    Diabetes mellitus type 2 in obese ([place])    hgba1c unacceptable, minimize simple carbs. Increase exercise as tolerated. Continue current meds increase insulin dosing      Relevant Medications   liraglutide (VICTOZA) 18 MG/3ML SOPN   Other Relevant Orders   Comprehensive metabolic panel (Completed)   Hemoglobin A1c (Completed)   Hyperlipidemia, mixed    Encouraged heart healthy diet, increase exercise, avoid trans fats, consider a krill oil cap daily      Relevant Medications   carvedilol  (COREG) 3.125 MG tablet   Other Relevant Orders   Lipid panel (Completed)   Obesity    Encouraged DASH diet, decrease po intake and increase exercise as tolerated. Needs 7-8 hours of sleep nightly. Avoid trans fats, eat small, frequent meals every 4-5 hours with lean proteins, complex carbs and healthy fats. Minimize simple carbs,      Relevant Medications   liraglutide (VICTOZA) 18 MG/3ML SOPN   Right Achilles tendinitis    Has flared recently and was seen by Sports Medicine, strechting, Advil 400 mg every 4-6 hours      Benign paroxysmal positional vertigo    Hydrate, increase protein and hydration.hyland's leg gramp med. Consider vestibular rehab and notify if worsens.      Preventative health care    Patient encouraged to maintain heart healthy diet, regular exercise, adequate sleep. Consider daily probiotics. Take medications as prescribed      Relevant Orders   CBC (Completed)   TSH (Completed)   Visual changes    Has cataracts and possibly glaucoma was seeing Dr Liam Graham, optometry but they discovered cataracts and glaucoma and  they are referring her to an opthamologist         I have discontinued Roberta Lomanto's NONFORMULARY OR COMPOUNDED ITEM and INVOKANA. I am also having her maintain her Lancet Device, multivitamin, aspirin, naproxen, metFORMIN, nitroGLYCERIN, albuterol, glucose blood, ACCU-CHEK GUIDE, ACCU-CHEK FASTCLIX LANCETS, traMADol, tiZANidine, potassium chloride SA, carvedilol, glimepiride, hydrochlorothiazide, atorvastatin, UNIFINE PENTIPS, JANUVIA, methylPREDNISolone, dapagliflozin propanediol, meclizine, carvedilol, promethazine, and liraglutide.  Meds ordered this encounter  Medications  . meclizine (ANTIVERT) 25 MG tablet    Sig: Take 1 tablet (25 mg total) by mouth 3 (three) times daily as needed for dizziness.    Dispense:  30 tablet    Refill:  0  . carvedilol (COREG) 3.125 MG tablet    Sig: TAKE 1 TABLET BY MOUTH 2 TIMES DAILY WITH A MEAL      Dispense:  60 tablet    Refill:  6  . promethazine (PHENERGAN) 25 MG tablet    Sig: Take 1 tablet (25 mg total) by mouth every 8 (eight) hours as needed for nausea or vomiting.    Dispense:  20 tablet    Refill:  0  . liraglutide (VICTOZA) 18 MG/3ML SOPN    Sig: Inject 0.3 mLs (1.8 mg total) into the skin daily.    Dispense:  6 mL    Refill:  6    CMA served as scribe during this visit. History, Physical and Plan performed by medical provider. Documentation and orders reviewed and attested to.  Penni Homans, MD

## 2017-11-02 NOTE — Assessment & Plan Note (Signed)
Has flared recently and was seen by Sports Medicine, strechting, Advil 400 mg every 4-6 hours

## 2017-11-02 NOTE — Assessment & Plan Note (Signed)
Encouraged heart healthy diet, increase exercise, avoid trans fats, consider a krill oil cap daily 

## 2017-11-02 NOTE — Patient Instructions (Signed)
Shingrix, 2 shots over 2-6 months   Preventive Care 40-64 Years, Female Preventive care refers to lifestyle choices and visits with your health care provider that can promote health and wellness. What does preventive care include?  A yearly physical exam. This is also called an annual well check.  Dental exams once or twice a year.  Routine eye exams. Ask your health care provider how often you should have your eyes checked.  Personal lifestyle choices, including: ? Daily care of your teeth and gums. ? Regular physical activity. ? Eating a healthy diet. ? Avoiding tobacco and drug use. ? Limiting alcohol use. ? Practicing safe sex. ? Taking low-dose aspirin daily starting at age 30. ? Taking vitamin and mineral supplements as recommended by your health care provider. What happens during an annual well check? The services and screenings done by your health care provider during your annual well check will depend on your age, overall health, lifestyle risk factors, and family history of disease. Counseling Your health care provider may ask you questions about your:  Alcohol use.  Tobacco use.  Drug use.  Emotional well-being.  Home and relationship well-being.  Sexual activity.  Eating habits.  Work and work Statistician.  Method of birth control.  Menstrual cycle.  Pregnancy history.  Screening You may have the following tests or measurements:  Height, weight, and BMI.  Blood pressure.  Lipid and cholesterol levels. These may be checked every 5 years, or more frequently if you are over 58 years old.  Skin check.  Lung cancer screening. You may have this screening every year starting at age 35 if you have a 30-pack-year history of smoking and currently smoke or have quit within the past 15 years.  Fecal occult blood test (FOBT) of the stool. You may have this test every year starting at age 61.  Flexible sigmoidoscopy or colonoscopy. You may have a  sigmoidoscopy every 5 years or a colonoscopy every 10 years starting at age 5.  Hepatitis C blood test.  Hepatitis B blood test.  Sexually transmitted disease (STD) testing.  Diabetes screening. This is done by checking your blood sugar (glucose) after you have not eaten for a while (fasting). You may have this done every 1-3 years.  Mammogram. This may be done every 1-2 years. Talk to your health care provider about when you should start having regular mammograms. This may depend on whether you have a family history of breast cancer.  BRCA-related cancer screening. This may be done if you have a family history of breast, ovarian, tubal, or peritoneal cancers.  Pelvic exam and Pap test. This may be done every 3 years starting at age 62. Starting at age 68, this may be done every 5 years if you have a Pap test in combination with an HPV test.  Bone density scan. This is done to screen for osteoporosis. You may have this scan if you are at high risk for osteoporosis.  Discuss your test results, treatment options, and if necessary, the need for more tests with your health care provider. Vaccines   Your health care provider may recommend certain vaccines, such as:  Influenza vaccine. This is recommended every year.  Tetanus, diphtheria, and acellular pertussis (Tdap, Td) vaccine. You may need a Td booster every 10 years.  Varicella vaccine. You may need this if you have not been vaccinated.  Zoster vaccine. You may need this after age 28.  Measles, mumps, and rubella (MMR) vaccine. You may need at  least one dose of MMR if you were born in 1957 or later. You may also need a second dose.  Pneumococcal 13-valent conjugate (PCV13) vaccine. You may need this if you have certain conditions and were not previously vaccinated.  Pneumococcal polysaccharide (PPSV23) vaccine. You may need one or two doses if you smoke cigarettes or if you have certain conditions.  Meningococcal vaccine. You  may need this if you have certain conditions.  Hepatitis A vaccine. You may need this if you have certain conditions or if you travel or work in places where you may be exposed to hepatitis A.  Hepatitis B vaccine. You may need this if you have certain conditions or if you travel or work in places where you may be exposed to hepatitis B.  Haemophilus influenzae type b (Hib) vaccine. You may need this if you have certain conditions.  Talk to your health care provider about which screenings and vaccines you need and how often you need them. This information is not intended to replace advice given to you by your health care provider. Make sure you discuss any questions you have with your health care provider. Document Released: 10/02/2015 Document Revised: 05/25/2016 Document Reviewed: 07/07/2015 Elsevier Interactive Patient Education  [employer].

## 2017-11-02 NOTE — Assessment & Plan Note (Signed)
Patient encouraged to maintain heart healthy diet, regular exercise, adequate sleep. Consider daily probiotics. Take medications as prescribed 

## 2017-11-02 NOTE — Assessment & Plan Note (Signed)
Hydrate, increase protein and hydration.hyland's leg gramp med. Consider vestibular rehab and notify if worsens.

## 2017-11-03 ENCOUNTER — Other Ambulatory Visit: Payer: Self-pay | Admitting: Family Medicine

## 2017-11-03 LAB — CBC
HCT: 45.9 % (ref 36.0–46.0)
Hemoglobin: 14.9 g/dL (ref 12.0–15.0)
MCHC: 32.4 g/dL (ref 30.0–36.0)
MCV: 92.9 fl (ref 78.0–100.0)
Platelets: 412 10*3/uL — ABNORMAL HIGH (ref 150.0–400.0)
RBC: 4.94 Mil/uL (ref 3.87–5.11)
RDW: 14.1 % (ref 11.5–15.5)
WBC: 10.4 10*3/uL (ref 4.0–10.5)

## 2017-11-03 LAB — COMPREHENSIVE METABOLIC PANEL
ALT: 14 U/L (ref 0–35)
AST: 14 U/L (ref 0–37)
Albumin: 4.2 g/dL (ref 3.5–5.2)
Alkaline Phosphatase: 90 U/L (ref 39–117)
BUN: 16 mg/dL (ref 6–23)
CO2: 30 mEq/L (ref 19–32)
Calcium: 9.6 mg/dL (ref 8.4–10.5)
Chloride: 99 mEq/L (ref 96–112)
Creatinine, Ser: 0.74 mg/dL (ref 0.40–1.20)
GFR: 104.79 mL/min (ref >60.00)
Glucose, Bld: 207 mg/dL — ABNORMAL HIGH (ref 70–99)
Potassium: 4.3 mEq/L (ref 3.5–5.1)
Sodium: 137 mEq/L (ref 135–145)
Total Bilirubin: 0.5 mg/dL (ref 0.2–1.2)
Total Protein: 7.6 g/dL (ref 6.0–8.3)

## 2017-11-03 LAB — LIPID PANEL
Cholesterol: 213 mg/dL — ABNORMAL HIGH (ref 0–200)
HDL: 37.3 mg/dL — ABNORMAL LOW (ref >39.00)
LDL Cholesterol: 153 mg/dL — ABNORMAL HIGH (ref 0–99)
NonHDL: 176.05
Total CHOL/HDL Ratio: 6
Triglycerides: 117 mg/dL (ref 0.0–149.0)
VLDL: 23.4 mg/dL (ref 0.0–40.0)

## 2017-11-03 LAB — TSH: TSH: 1.15 u[IU]/mL (ref 0.35–4.50)

## 2017-11-03 LAB — HEMOGLOBIN A1C: Hgb A1c MFr Bld: 9 % — ABNORMAL HIGH (ref 4.6–6.5)

## 2017-11-03 MED FILL — ATORVASTATIN 20 MG TABLET: 20 | 90 days supply | Qty: 90 | Fill #1

## 2017-11-03 MED FILL — METFORMIN HCL ER 500 MG TAB: 500 | 30 days supply | Qty: 150 | Fill #0

## 2017-11-03 MED FILL — GLIMEPIRIDE 4 MG TABLET: 4 | 30 days supply | Qty: 60 | Fill #6

## 2017-11-03 MED FILL — PANTOPRAZOLE SOD DR 40 MG T: 40 | 30 days supply | Qty: 30 | Fill #0

## 2017-11-03 MED FILL — JANUVIA 100 MG TABLET: 100 | 30 days supply | Qty: 30 | Fill #4

## 2017-11-05 NOTE — Assessment & Plan Note (Signed)
Encouraged DASH diet, decrease po intake and increase exercise as tolerated. Needs 7-8 hours of sleep nightly. Avoid trans fats, eat small, frequent meals every 4-5 hours with lean proteins, complex carbs and healthy fats. Minimize simple carbs, 

## 2017-11-10 ENCOUNTER — Encounter: Payer: Self-pay | Admitting: Family Medicine

## 2017-12-06 DIAGNOSIS — H40031 Anatomical narrow angle, right eye: Secondary | ICD-10-CM | POA: Diagnosis not present

## 2017-12-06 DIAGNOSIS — H40033 Anatomical narrow angle, bilateral: Secondary | ICD-10-CM | POA: Diagnosis not present

## 2017-12-06 MED FILL — PREDNISOLONE AC 1% EYE DROP: 1 | 16 days supply | Qty: 5 | Fill #0

## 2017-12-07 ENCOUNTER — Telehealth: Payer: Self-pay | Admitting: Family Medicine

## 2017-12-07 ENCOUNTER — Encounter: Payer: Self-pay | Admitting: Family Medicine

## 2017-12-07 ENCOUNTER — Ambulatory Visit: Payer: 59 | Admitting: Family Medicine

## 2017-12-07 DIAGNOSIS — M7661 Achilles tendinitis, right leg: Secondary | ICD-10-CM | POA: Diagnosis not present

## 2017-12-07 MED ORDER — NITROGLYCERIN 0.2 MG/HR TD PT24: MEDICATED_PATCH | TRANSDERMAL | 1 refills | Status: DC

## 2017-12-07 MED FILL — NITROGLYCERIN 0.2 MG/HR PTC: 0.2 | 30 days supply | Qty: 8 | Fill #0

## 2017-12-07 NOTE — Patient Instructions (Signed)
You have Achilles Tendinopathy Ibuprofen as needed at this point. Start nitro patches - 1/4th patch to affected achilles, change daily. Calf raises 3 sets of 10 on level ground once a day first. When these are easy, can do them one legged 3 sets of 10. Finally advance to doing them on a step. Can add heel walks, toe walks forward and backward as well Icing 15 minutes at a time 3-4 times a day. Avoid uneven ground, hills as much as possible. Heel lifts in shoes or shoes with a natural heel lift. Let me know if you want to do physical therapy. Custom orthotics are another consideration. Follow up in 6 weeks.

## 2017-12-07 NOTE — Telephone Encounter (Signed)
Patient dropped off Paperwork, Paperwork placed in [employer].

## 2017-12-08 ENCOUNTER — Encounter: Payer: Self-pay | Admitting: Family Medicine

## 2017-12-08 NOTE — Progress Notes (Signed)
PCP: Mosie Lukes, MD  Subjective:   HPI: Patient is a 57 y.o. female here for right heel pain.  2/7: Patient reports she started to get pain lateral right foot/heel about a month ago. No acute injury though she did fall a few months ago (improved completely though). She did have problems with achilles a few years ago but improved. Has tried icing, naproxen. Pain level 4-5/10 and sharp. Worse with walking. No skin changes, numbness.  3/21: Patient reports she feels a little better compared to last visit but she's reporting pain level is higher at 7/10 and sharp. Can feel stuck if she steps wrong. Pain radiates up back of right leg. Wearing heel lifts in her work shoes. Doing stretches for heel and taking ibuprofen. No skin changes, numbness.  Past Medical History:  Diagnosis Date  . Achilles tendonitis 10/14/2012  . Asthma    childhood  . Atypical chest pain    normal stress echo 02/2009 -LV size was normal LV global systolic function was normal- Normal wall motion; no LV regional wall motion abnormalities  . Benign paroxysmal positional vertigo 04/26/2015  . Chest pain, atypical 08/24/2013  . Diabetes mellitus   . GERD (gastroesophageal reflux disease)   . Hand pain, left 11/10/2015  . Hyperlipemia   . Hypertension   . Neuropathic pain of ankle, left 04/27/2017  . Onychomycosis 04/17/2014  . Pain of right heel 10/14/2012  . Preventative health care 11/22/2015  . Sinusitis, acute 01/17/2016  . Thoracic outlet syndrome 11/03/2014  . Urinary urgency 12/15/2015    Current Outpatient Medications on File Prior to Visit  Medication Sig Dispense Refill  . ACCU-CHEK FASTCLIX LANCETS MISC Use as directed twice daily to check blood sugar.  DX E11.9 102 each 6  . albuterol (PROVENTIL HFA;VENTOLIN HFA) 108 (90 Base) MCG/ACT inhaler Inhale 2 puffs into the lungs every 6 (six) hours as needed for wheezing or shortness of breath. 1 Inhaler 6  . aspirin 81 MG tablet Take 81 mg by mouth daily.     Marland Kitchen atorvastatin (LIPITOR) 20 MG tablet TAKE 1 TABLET (20 MG TOTAL) BY MOUTH DAILY. 90 tablet 3  . Blood Glucose Monitoring Suppl (ACCU-CHEK GUIDE) w/Device KIT Inject 1 kit as directed 2 (two) times daily. Use to check blood sugar.  DX E11.9 1 kit 0  . carvedilol (COREG) 3.125 MG tablet TAKE 1 TABLET BY MOUTH TWICE DAILY WITH MEALS 60 tablet 11  . carvedilol (COREG) 3.125 MG tablet TAKE 1 TABLET BY MOUTH 2 TIMES DAILY WITH A MEAL 60 tablet 6  . dapagliflozin propanediol (FARXIGA) 5 MG TABS tablet Take 5 mg by mouth daily. 30 tablet 3  . glimepiride (AMARYL) 4 MG tablet TAKE 1 TABLET (4 MG TOTAL) BY MOUTH 2 (TWO) TIMES DAILY. 60 tablet 6  . glucose blood (ACCU-CHEK GUIDE) test strip Use as directed twice daily to check blood sugar.  DX E11.9 100 each 6  . hydrochlorothiazide (HYDRODIURIL) 25 MG tablet TAKE 1 TABLET (25 MG TOTAL) BY MOUTH DAILY. 90 tablet 5  . JANUVIA 100 MG tablet TAKE 1 TABLET (100 MG TOTAL) BY MOUTH DAILY. 30 tablet 6  . Lancet Device MISC by Does not apply route. Use to test blood sugar three times daily     . liraglutide (VICTOZA) 18 MG/3ML SOPN Inject 0.3 mLs (1.8 mg total) into the skin daily. 6 mL 6  . meclizine (ANTIVERT) 25 MG tablet Take 1 tablet (25 mg total) by mouth 3 (three) times daily as needed  for dizziness. 30 tablet 0  . metFORMIN (GLUCOPHAGE-XR) 500 MG 24 hr tablet TAKE 2 TABLETS BY MOUTH TWICE DAILY AND 1 TABLET AT NOON 150 tablet 5  . metFORMIN (GLUCOPHAGE-XR) 500 MG 24 hr tablet TAKE 2 TABLETS BY MOUTH TWICE DAILY AND 1 TABLET AT NOON 150 tablet 5  . methylPREDNISolone (MEDROL) 4 MG tablet 5 tab po qd X 1d then 4 tab po qd X 1d then 3 tab po qd X 1d then 2 tab po qd then 1 tab po qd 15 tablet 0  . Multiple Vitamin (MULTIVITAMIN) tablet Take 1 tablet by mouth daily. Reported on 01/12/2016    . naproxen (NAPROSYN) 500 MG tablet Take 1 tablet (500 mg total) by mouth 2 (two) times daily as needed for mild pain or moderate pain (with food). 60 tablet 1  .  nitroGLYCERIN (NITROSTAT) 0.4 MG SL tablet Place 1 tablet (0.4 mg total) under the tongue every 5 (five) minutes as needed for chest pain. 25 tablet 3  . pantoprazole (PROTONIX) 40 MG tablet TAKE 1 TABLET BY MOUTH ONCE DAILY 30 tablet 5  . potassium chloride SA (K-DUR,KLOR-CON) 20 MEQ tablet Take 1 tablet (20 mEq total) by mouth daily. 30 tablet 3  . promethazine (PHENERGAN) 25 MG tablet Take 1 tablet (25 mg total) by mouth every 8 (eight) hours as needed for nausea or vomiting. 20 tablet 0  . tiZANidine (ZANAFLEX) 4 MG tablet Take 1 tablet (4 mg total) by mouth every 6 (six) hours as needed for muscle spasms. 40 tablet 1  . traMADol (ULTRAM) 50 MG tablet TAKE 2 TABLETS BY MOUTH TWICE DAILY AS NEEDED 60 tablet PRN  . UNIFINE PENTIPS 31G X 5 MM MISC USE AS DIRECTED 100 each 2   No current facility-administered medications on file prior to visit.     Past Surgical History:  Procedure Laterality Date  . ABDOMINAL HYSTERECTOMY    . CYSTOSCOPY  07/23/2012   Procedure: CYSTOSCOPY;  Surgeon: Marvene Staff, MD;  Location: [facility];  Service: Gynecology;  Laterality: N/A;  . DILATION AND CURETTAGE OF UTERUS     Lysis of Adhesions  . ROBOTIC ASSISTED LAPAROSCOPIC LYSIS OF ADHESION  07/23/2012   Procedure: ROBOTIC ASSISTED LAPAROSCOPIC LYSIS OF ADHESION;  Surgeon: Marvene Staff, MD;  Location: [facility];  Service: Gynecology;  Laterality: N/A;  . ROBOTIC ASSISTED SALPINGO OOPHERECTOMY  07/23/2012   Procedure: ROBOTIC ASSISTED SALPINGO OOPHERECTOMY;  Surgeon: Marvene Staff, MD;  Location: [facility];  Service: Gynecology;  Laterality: Bilateral;    No Known Allergies  Social History   Socioeconomic History  . Marital status: Single    Spouse name: Not on file  . Number of children: Not on file  . Years of education: Not on file  . Highest education level: Not on file  Occupational History  . Not on file  Social Needs  . Financial resource strain: Not on file  . Food insecurity:     Worry: Not on file    Inability: Not on file  . Transportation needs:    Medical: Not on file    Non-medical: Not on file  Tobacco Use  . Smoking status: Former Research scientist (life sciences)  . Smokeless tobacco: Never Used  Substance and Sexual Activity  . Alcohol use: No  . Drug use: No  . Sexual activity: Not on file  Lifestyle  . Physical activity:    Days per week: Not on file    Minutes per session: Not on file  .  Stress: Not on file  Relationships  . Social connections:    Talks on phone: Not on file    Gets together: Not on file    Attends religious service: Not on file    Active member of club or organization: Not on file    Attends meetings of clubs or organizations: Not on file    Relationship status: Not on file  . Intimate partner violence:    Fear of current or ex partner: Not on file    Emotionally abused: Not on file    Physically abused: Not on file    Forced sexual activity: Not on file  Other Topics Concern  . Not on file  Social History Narrative  . Not on file    Family History  Problem Relation Age of Onset  . GER disease Mother   . GER disease Father   . Heart disease Father        father has "heart problems"  . Anemia Father     BP 123/78   Pulse 88   Ht '5\' 4"'$  (1.626 m)   Wt 230 lb (104.3 kg)   BMI 39.48 kg/m   Review of Systems: See HPI above.     Objective:  Physical Exam:  Gen: NAD, comfortable in exam room.  Right foot/ankle: No gross deformity, swelling, ecchymoses FROM with 5/5 strength, pain on dorsiflexion. TTP insertion of achilles.  No other tenderness. Negative ant drawer and talar tilt.   Negative syndesmotic compression. Negative calcaneal squeeze. Thompsons test negative. NV intact distally.   Assessment & Plan:  1. Right insertional achilles tendinopathy - Encouraged strengthening exercises in addition to her stretching.  Icing, heel lifts.  Declined physical therapy for now.  Ibuprofen as needed.  Start nitro patches.  F/u in 6  weeks.

## 2017-12-08 NOTE — Assessment & Plan Note (Signed)
Right insertional achilles tendinopathy - Encouraged strengthening exercises in addition to her stretching.  Icing, heel lifts.  Declined physical therapy for now.  Ibuprofen as needed.  Start nitro patches.  F/u in 6 weeks.

## 2017-12-11 NOTE — Telephone Encounter (Signed)
Received Application and Renewal of Disability Parking Placard form [employer]; forward to provider [patient will need to come in and sign in the applicant section, then can make copy to scan]/SLS 03/25

## 2017-12-14 ENCOUNTER — Other Ambulatory Visit: Payer: Self-pay | Admitting: Family Medicine

## 2017-12-14 MED FILL — CARVEDILOL 3.125 MG TABLET: 3.125 | 30 days supply | Qty: 60 | Fill #1

## 2017-12-14 MED FILL — JANUVIA 100 MG TABLET: 100 | 30 days supply | Qty: 30 | Fill #5

## 2017-12-14 MED FILL — VICTOZA 18 MG/3 ML INJECT P: 18 | 30 days supply | Qty: 9 | Fill #1

## 2017-12-14 MED FILL — UNIFINE PENTIPS 31GX3/16": 31G X 5 MM | 90 days supply | Qty: 100 | Fill #1

## 2017-12-14 MED FILL — METFORMIN HCL ER 500 MG TAB: 500 | 30 days supply | Qty: 150 | Fill #1

## 2017-12-14 MED FILL — UNIFINE PENTIPS 31GX3/16: 31G X 5 MM | 90 days supply | Qty: 100 | Fill #1

## 2017-12-14 MED FILL — GLIMEPIRIDE 4 MG TABLET: 4 | 30 days supply | Qty: 60 | Fill #0

## 2017-12-14 MED FILL — HYDROCHLOROTHIAZIDE 25 MG T: 25 | 90 days supply | Qty: 90 | Fill #2

## 2017-12-14 MED FILL — INVOKANA 100 MG TABLET: 100 | 30 days supply | Qty: 30 | Fill #0

## 2017-12-14 NOTE — Telephone Encounter (Signed)
Pt is stating that she needs the originals mailed to her and the office keep a copy.  Can they be mailed?

## 2017-12-15 NOTE — Telephone Encounter (Signed)
Spoke with patient via phone, informed her that I will be glad to place item in the mail to her; verified address and placed in outgoing mail/SLS 03/29

## 2017-12-26 DIAGNOSIS — H40032 Anatomical narrow angle, left eye: Secondary | ICD-10-CM | POA: Diagnosis not present

## 2018-01-11 MED FILL — PANTOPRAZOLE SOD DR 40 MG T: 40 | 30 days supply | Qty: 30 | Fill #1

## 2018-01-11 MED FILL — INVOKANA 100 MG TABLET: 100 | 30 days supply | Qty: 30 | Fill #1

## 2018-01-11 MED FILL — JANUVIA 100 MG TABLET: 100 | 30 days supply | Qty: 30 | Fill #6

## 2018-01-12 MED FILL — VICTOZA 18 MG/3 ML INJECT P: 18 | 30 days supply | Qty: 9 | Fill #2

## 2018-01-18 ENCOUNTER — Ambulatory Visit: Payer: Self-pay | Admitting: Family Medicine

## 2018-02-01 ENCOUNTER — Encounter: Payer: Self-pay | Admitting: Family Medicine

## 2018-02-01 ENCOUNTER — Telehealth: Payer: Self-pay | Admitting: Family Medicine

## 2018-02-01 ENCOUNTER — Ambulatory Visit: Payer: 59 | Admitting: Family Medicine

## 2018-02-01 VITALS — BP 122/76 | HR 81 | Temp 97.5°F | Resp 18 | Wt 235.6 lb

## 2018-02-01 DIAGNOSIS — E782 Mixed hyperlipidemia: Secondary | ICD-10-CM

## 2018-02-01 DIAGNOSIS — R109 Unspecified abdominal pain: Secondary | ICD-10-CM | POA: Diagnosis not present

## 2018-02-01 DIAGNOSIS — E669 Obesity, unspecified: Secondary | ICD-10-CM

## 2018-02-01 DIAGNOSIS — I10 Essential (primary) hypertension: Secondary | ICD-10-CM

## 2018-02-01 DIAGNOSIS — E785 Hyperlipidemia, unspecified: Secondary | ICD-10-CM

## 2018-02-01 DIAGNOSIS — E6609 Other obesity due to excess calories: Secondary | ICD-10-CM

## 2018-02-01 DIAGNOSIS — E1169 Type 2 diabetes mellitus with other specified complication: Secondary | ICD-10-CM

## 2018-02-01 DIAGNOSIS — R1032 Left lower quadrant pain: Secondary | ICD-10-CM

## 2018-02-01 MED ORDER — PROMETHAZINE HCL 25 MG PO TABS: 25.0000 mg | ORAL_TABLET | Freq: Three times a day (TID) | ORAL | 0 refills | Status: AC | PRN

## 2018-02-01 MED ORDER — METFORMIN HCL ER 500 MG PO TB24: ORAL_TABLET | ORAL | 5 refills | Status: DC

## 2018-02-01 MED ORDER — GLIMEPIRIDE 4 MG PO TABS: 4.0000 mg | ORAL_TABLET | Freq: Two times a day (BID) | ORAL | 6 refills | Status: DC

## 2018-02-01 MED ORDER — LIRAGLUTIDE 18 MG/3ML ~~LOC~~ SOPN: 1.8000 mg | PEN_INJECTOR | Freq: Every day | SUBCUTANEOUS | 6 refills | Status: DC

## 2018-02-01 MED ORDER — POTASSIUM CHLORIDE CRYS ER 20 MEQ PO TBCR: 20.0000 meq | EXTENDED_RELEASE_TABLET | Freq: Every day | ORAL | 3 refills | Status: DC

## 2018-02-01 MED ORDER — DAPAGLIFLOZIN PROPANEDIOL 5 MG PO TABS: 5.0000 mg | ORAL_TABLET | Freq: Every day | ORAL | 3 refills | Status: DC

## 2018-02-01 MED ORDER — HYDROCHLOROTHIAZIDE 25 MG PO TABS: 25.0000 mg | ORAL_TABLET | Freq: Every day | ORAL | 5 refills | Status: DC

## 2018-02-01 MED ORDER — GLUCOSE BLOOD VI STRP
ORAL_STRIP | 6 refills | Status: DC
Start: 2018-02-01 — End: 2019-08-12

## 2018-02-01 MED ORDER — HYOSCYAMINE SULFATE 0.125 MG SL SUBL: 0.1250 mg | SUBLINGUAL_TABLET | SUBLINGUAL | 0 refills | Status: DC | PRN

## 2018-02-01 MED ORDER — MECLIZINE HCL 25 MG PO TABS: 25.0000 mg | ORAL_TABLET | Freq: Three times a day (TID) | ORAL | 0 refills | Status: DC | PRN

## 2018-02-01 MED ORDER — ACCU-CHEK FASTCLIX LANCETS MISC: 6 refills | Status: DC

## 2018-02-01 MED ORDER — CANAGLIFLOZIN 100 MG PO TABS: 100.0000 mg | ORAL_TABLET | Freq: Every day | ORAL | 2 refills | Status: DC

## 2018-02-01 MED ORDER — TRAMADOL HCL 50 MG PO TABS: ORAL_TABLET | ORAL | 0 refills | Status: DC

## 2018-02-01 MED ORDER — NAPROXEN 500 MG PO TABS: 500.0000 mg | ORAL_TABLET | Freq: Two times a day (BID) | ORAL | 1 refills | Status: DC | PRN

## 2018-02-01 MED ORDER — ALBUTEROL SULFATE HFA 108 (90 BASE) MCG/ACT IN AERS: 2.0000 | INHALATION_SPRAY | Freq: Four times a day (QID) | RESPIRATORY_TRACT | 6 refills | Status: DC | PRN

## 2018-02-01 MED ORDER — TIZANIDINE HCL 4 MG PO TABS: 4.0000 mg | ORAL_TABLET | Freq: Four times a day (QID) | ORAL | 1 refills | Status: DC | PRN

## 2018-02-01 MED ORDER — ATORVASTATIN CALCIUM 20 MG PO TABS: 20.0000 mg | ORAL_TABLET | Freq: Every day | ORAL | 3 refills | Status: DC

## 2018-02-01 MED ORDER — SITAGLIPTIN PHOSPHATE 100 MG PO TABS: 100.0000 mg | ORAL_TABLET | Freq: Every day | ORAL | 6 refills | Status: DC

## 2018-02-01 MED ORDER — PANTOPRAZOLE SODIUM 40 MG PO TBEC: 40.0000 mg | DELAYED_RELEASE_TABLET | Freq: Every day | ORAL | 5 refills | Status: DC

## 2018-02-01 MED FILL — PROMETHAZINE 25 MG TABLET: 25 | 7 days supply | Qty: 20 | Fill #0

## 2018-02-01 MED FILL — tiZANidine HCL 4 MG TABS: 4 | 10 days supply | Qty: 40 | Fill #0

## 2018-02-01 MED FILL — NAPROXEN 500 MG TABLET: 500 | 30 days supply | Qty: 60 | Fill #0

## 2018-02-01 MED FILL — MECLIZINE 25 MG TABLET: 25 | 10 days supply | Qty: 30 | Fill #0

## 2018-02-01 MED FILL — GLIMEPIRIDE 4 MG TABLET: 4 | 30 days supply | Qty: 60 | Fill #0

## 2018-02-01 MED FILL — METFORMIN HCL ER 500 MG TAB: 500 | 30 days supply | Qty: 150 | Fill #0

## 2018-02-01 MED FILL — ACCU-CHEK GUIDE STRP: 50 days supply | Qty: 100 | Fill #0

## 2018-02-01 MED FILL — OSCIMIN SL 0.125 MG TABLET: 0.125 | 5 days supply | Qty: 30 | Fill #0

## 2018-02-01 MED FILL — FARXIGA 5 MG TABLET: 5 | 30 days supply | Qty: 30 | Fill #0

## 2018-02-01 MED FILL — traMADol HCL 50 MG TABS: 50 | 15 days supply | Qty: 60 | Fill #0

## 2018-02-01 MED FILL — VENTOLIN HFA 90 MCG INHALER: 108 (90 BAS | 25 days supply | Qty: 18 | Fill #0

## 2018-02-01 MED FILL — ATORVASTATIN 20 MG TABLET: 20 | 90 days supply | Qty: 90 | Fill #0

## 2018-02-01 MED FILL — ACCU-CHEK FASTCLIX LANCETS: 50 days supply | Qty: 102 | Fill #0

## 2018-02-01 NOTE — Patient Instructions (Signed)

## 2018-02-01 NOTE — Telephone Encounter (Signed)
Copied from [place] [phone]. Topic: Quick Communication - See Telephone Encounter >> Feb 01, 2018  3:27 PM Vernona Rieger wrote: CRM for notification. See Telephone encounter for: 02/01/18.  Elmo Putt from [facility] called about a drug interaction with hyoscyamine (LEVSIN SL) 0.125 MG SL tablet & potassium chloride SA (K-DUR,KLOR-CON) 20 MEQ tablet. She said they shouldn't be used together. She said can they switch the potassium to a liquid then the drug interaction would go away. Please advise?  Call back is [phone]

## 2018-02-02 LAB — COMPREHENSIVE METABOLIC PANEL
ALT: 22 U/L (ref 0–35)
AST: 16 U/L (ref 0–37)
Albumin: 4.1 g/dL (ref 3.5–5.2)
Alkaline Phosphatase: 91 U/L (ref 39–117)
BUN: 13 mg/dL (ref 6–23)
CO2: 30 mEq/L (ref 19–32)
Calcium: 10.1 mg/dL (ref 8.4–10.5)
Chloride: 100 mEq/L (ref 96–112)
Creatinine, Ser: 0.8 mg/dL (ref 0.40–1.20)
GFR: 95.68 mL/min (ref >60.00)
Glucose, Bld: 198 mg/dL — ABNORMAL HIGH (ref 70–99)
Potassium: 4.2 mEq/L (ref 3.5–5.1)
Sodium: 139 mEq/L (ref 135–145)
Total Bilirubin: 0.5 mg/dL (ref 0.2–1.2)
Total Protein: 7.5 g/dL (ref 6.0–8.3)

## 2018-02-02 LAB — LIPID PANEL
Cholesterol: 169 mg/dL (ref 0–200)
HDL: 40.6 mg/dL (ref >39.00)
LDL Cholesterol: 102 mg/dL — ABNORMAL HIGH (ref 0–99)
NonHDL: 127.95
Total CHOL/HDL Ratio: 4
Triglycerides: 132 mg/dL (ref 0.0–149.0)
VLDL: 26.4 mg/dL (ref 0.0–40.0)

## 2018-02-02 LAB — CBC
HCT: 45.3 % (ref 36.0–46.0)
Hemoglobin: 15 g/dL (ref 12.0–15.0)
MCHC: 33.1 g/dL (ref 30.0–36.0)
MCV: 91.6 fl (ref 78.0–100.0)
Platelets: 414 10*3/uL — ABNORMAL HIGH (ref 150.0–400.0)
RBC: 4.94 Mil/uL (ref 3.87–5.11)
RDW: 14.6 % (ref 11.5–15.5)
WBC: 11.2 10*3/uL — ABNORMAL HIGH (ref 4.0–10.5)

## 2018-02-02 LAB — TSH: TSH: 1.36 u[IU]/mL (ref 0.35–4.50)

## 2018-02-02 LAB — HEMOGLOBIN A1C: Hgb A1c MFr Bld: 8.9 % — ABNORMAL HIGH (ref 4.6–6.5)

## 2018-02-04 NOTE — Assessment & Plan Note (Signed)
Encouraged heart healthy diet, increase exercise, avoid trans fats, consider a krill oil cap daily. Tolerating Atorvastatin 

## 2018-02-04 NOTE — Assessment & Plan Note (Signed)
Well controlled, no changes to meds. Encouraged heart healthy diet such as the DASH diet and exercise as tolerated.  °

## 2018-02-04 NOTE — Assessment & Plan Note (Signed)
hgba1c acceptable, minimize simple carbs. Increase exercise as tolerated. Continue current meds 

## 2018-02-04 NOTE — Assessment & Plan Note (Signed)
Encouraged DASH diet, decrease po intake and increase exercise as tolerated. Needs 7-8 hours of sleep nightly. Avoid trans fats, eat small, frequent meals every 4-5 hours with lean proteins, complex carbs and healthy fats. Minimize simple carbs 

## 2018-02-04 NOTE — Progress Notes (Signed)
Patient ID: Eileen Richards, female   DOB: June 04, 1961, 57 y.o.   MRN: [id number]   Subjective:    Patient ID: Eileen Richards, female    DOB: March 12, 1961, 57 y.o.   MRN: [id number]  No chief complaint on file.   HPI Patient is in today for follow up on diabetes, hyperlipidemia, HTN and more. She continues to struggle with intermittent left lower quadrant pain. She denis any pattern or change in bowel habits. No urinary symptoms. She denies any fevers or hospitalizations. No bloody or tarry stool. No change in appetite or anorexia. Denies CP/palp/SOB/HA/congestion/fevers/GI or GU c/o. Taking meds as prescribed  Past Medical History:  Diagnosis Date  . Achilles tendonitis 10/14/2012  . Asthma    childhood  . Atypical chest pain    normal stress echo 02/2009 -LV size was normal LV global systolic function was normal- Normal wall motion; no LV regional wall motion abnormalities  . Benign paroxysmal positional vertigo 04/26/2015  . Chest pain, atypical 08/24/2013  . Diabetes mellitus   . GERD (gastroesophageal reflux disease)   . Hand pain, left 11/10/2015  . Hyperlipemia   . Hypertension   . Neuropathic pain of ankle, left 04/27/2017  . Onychomycosis 04/17/2014  . Pain of right heel 10/14/2012  . Preventative health care 11/22/2015  . Sinusitis, acute 01/17/2016  . Thoracic outlet syndrome 11/03/2014  . Urinary urgency 12/15/2015    Past Surgical History:  Procedure Laterality Date  . ABDOMINAL HYSTERECTOMY    . CYSTOSCOPY  07/23/2012   Procedure: CYSTOSCOPY;  Surgeon: Marvene Staff, MD;  Location: [facility];  Service: Gynecology;  Laterality: N/A;  . DILATION AND CURETTAGE OF UTERUS     Lysis of Adhesions  . ROBOTIC ASSISTED LAPAROSCOPIC LYSIS OF ADHESION  07/23/2012   Procedure: ROBOTIC ASSISTED LAPAROSCOPIC LYSIS OF ADHESION;  Surgeon: Marvene Staff, MD;  Location: [facility];  Service: Gynecology;  Laterality: N/A;  . ROBOTIC ASSISTED SALPINGO OOPHERECTOMY  07/23/2012   Procedure:  ROBOTIC ASSISTED SALPINGO OOPHERECTOMY;  Surgeon: Marvene Staff, MD;  Location: [facility];  Service: Gynecology;  Laterality: Bilateral;    Family History  Problem Relation Age of Onset  . GER disease Mother   . GER disease Father   . Heart disease Father        father has "heart problems"  . Anemia Father     Social History   Socioeconomic History  . Marital status: Single    Spouse name: Not on file  . Number of children: Not on file  . Years of education: Not on file  . Highest education level: Not on file  Occupational History  . Not on file  Social Needs  . Financial resource strain: Not on file  . Food insecurity:    Worry: Not on file    Inability: Not on file  . Transportation needs:    Medical: Not on file    Non-medical: Not on file  Tobacco Use  . Smoking status: Former Research scientist (life sciences)  . Smokeless tobacco: Never Used  Substance and Sexual Activity  . Alcohol use: No  . Drug use: No  . Sexual activity: Not on file  Lifestyle  . Physical activity:    Days per week: Not on file    Minutes per session: Not on file  . Stress: Not on file  Relationships  . Social connections:    Talks on phone: Not on file    Gets together: Not on file    Attends religious service:  Not on file    Active member of club or organization: Not on file    Attends meetings of clubs or organizations: Not on file    Relationship status: Not on file  . Intimate partner violence:    Fear of current or ex partner: Not on file    Emotionally abused: Not on file    Physically abused: Not on file    Forced sexual activity: Not on file  Other Topics Concern  . Not on file  Social History Narrative  . Not on file    Outpatient Medications Prior to Visit  Medication Sig Dispense Refill  . aspirin 81 MG tablet Take 81 mg by mouth daily.    . Blood Glucose Monitoring Suppl (ACCU-CHEK GUIDE) w/Device KIT Inject 1 kit as directed 2 (two) times daily. Use to check blood sugar.  DX E11.9 1  kit 0  . carvedilol (COREG) 3.125 MG tablet TAKE 1 TABLET BY MOUTH TWICE DAILY WITH MEALS 60 tablet 11  . carvedilol (COREG) 3.125 MG tablet TAKE 1 TABLET BY MOUTH 2 TIMES DAILY WITH A MEAL 60 tablet 6  . Lancet Device MISC by Does not apply route. Use to test blood sugar three times daily     . nitroGLYCERIN (NITROSTAT) 0.4 MG SL tablet Place 1 tablet (0.4 mg total) under the tongue every 5 (five) minutes as needed for chest pain. 25 tablet 3  . ACCU-CHEK FASTCLIX LANCETS MISC Use as directed twice daily to check blood sugar.  DX E11.9 102 each 6  . albuterol (PROVENTIL HFA;VENTOLIN HFA) 108 (90 Base) MCG/ACT inhaler Inhale 2 puffs into the lungs every 6 (six) hours as needed for wheezing or shortness of breath. 1 Inhaler 6  . atorvastatin (LIPITOR) 20 MG tablet TAKE 1 TABLET (20 MG TOTAL) BY MOUTH DAILY. 90 tablet 3  . dapagliflozin propanediol (FARXIGA) 5 MG TABS tablet Take 5 mg by mouth daily. 30 tablet 3  . glimepiride (AMARYL) 4 MG tablet TAKE 1 TABLET BY MOUTH 2 TIMES DAILY. 60 tablet 6  . glucose blood (ACCU-CHEK GUIDE) test strip Use as directed twice daily to check blood sugar.  DX E11.9 100 each 6  . hydrochlorothiazide (HYDRODIURIL) 25 MG tablet TAKE 1 TABLET (25 MG TOTAL) BY MOUTH DAILY. 90 tablet 5  . INVOKANA 100 MG TABS tablet TAKE 1 TABLET BY MOUTH DAILY. 30 tablet 2  . JANUVIA 100 MG tablet TAKE 1 TABLET (100 MG TOTAL) BY MOUTH DAILY. 30 tablet 6  . liraglutide (VICTOZA) 18 MG/3ML SOPN Inject 0.3 mLs (1.8 mg total) into the skin daily. 6 mL 6  . meclizine (ANTIVERT) 25 MG tablet Take 1 tablet (25 mg total) by mouth 3 (three) times daily as needed for dizziness. 30 tablet 0  . metFORMIN (GLUCOPHAGE-XR) 500 MG 24 hr tablet TAKE 2 TABLETS BY MOUTH TWICE DAILY AND 1 TABLET AT NOON 150 tablet 5  . metFORMIN (GLUCOPHAGE-XR) 500 MG 24 hr tablet TAKE 2 TABLETS BY MOUTH TWICE DAILY AND 1 TABLET AT NOON 150 tablet 5  . methylPREDNISolone (MEDROL) 4 MG tablet 5 tab po qd X 1d then 4 tab po  qd X 1d then 3 tab po qd X 1d then 2 tab po qd then 1 tab po qd 15 tablet 0  . Multiple Vitamin (MULTIVITAMIN) tablet Take 1 tablet by mouth daily. Reported on 01/12/2016    . naproxen (NAPROSYN) 500 MG tablet Take 1 tablet (500 mg total) by mouth 2 (two) times daily as needed  for mild pain or moderate pain (with food). 60 tablet 1  . nitroGLYCERIN (NITRODUR - DOSED IN MG/24 HR) 0.2 mg/hr patch Apply 1/4th patch to affected achilles, change daily 30 patch 1  . pantoprazole (PROTONIX) 40 MG tablet TAKE 1 TABLET BY MOUTH ONCE DAILY 30 tablet 5  . potassium chloride SA (K-DUR,KLOR-CON) 20 MEQ tablet Take 1 tablet (20 mEq total) by mouth daily. 30 tablet 3  . promethazine (PHENERGAN) 25 MG tablet Take 1 tablet (25 mg total) by mouth every 8 (eight) hours as needed for nausea or vomiting. 20 tablet 0  . tiZANidine (ZANAFLEX) 4 MG tablet Take 1 tablet (4 mg total) by mouth every 6 (six) hours as needed for muscle spasms. 40 tablet 1  . traMADol (ULTRAM) 50 MG tablet TAKE 2 TABLETS BY MOUTH TWICE DAILY AS NEEDED 60 tablet PRN  . UNIFINE PENTIPS 31G X 5 MM MISC USE AS DIRECTED 100 each 2   No facility-administered medications prior to visit.     No Known Allergies  Review of Systems  Constitutional: Negative for fever and malaise/fatigue.  HENT: Negative for congestion.   Eyes: Negative for blurred vision.  Respiratory: Negative for shortness of breath.   Cardiovascular: Negative for chest pain, palpitations and leg swelling.  Gastrointestinal: Positive for abdominal pain. Negative for blood in stool and nausea.  Genitourinary: Negative for dysuria and frequency.  Musculoskeletal: Negative for falls.  Skin: Negative for rash.  Neurological: Negative for dizziness, loss of consciousness and headaches.  Endo/Heme/Allergies: Negative for environmental allergies.  Psychiatric/Behavioral: Negative for depression. The patient is not nervous/anxious.        Objective:    Physical Exam    Constitutional: She is oriented to person, place, and time. No distress.  HENT:  Head: Normocephalic and atraumatic.  Eyes: Conjunctivae are normal.  Neck: Neck supple. No thyromegaly present.  Cardiovascular: Normal rate, regular rhythm and normal heart sounds.  No murmur heard. Pulmonary/Chest: Effort normal and breath sounds normal. She has no wheezes.  Abdominal: She exhibits no distension and no mass.  Musculoskeletal: She exhibits no edema.  Lymphadenopathy:    She has no cervical adenopathy.  Neurological: She is alert and oriented to person, place, and time.  Skin: Skin is warm and dry. No rash noted. She is not diaphoretic.  Psychiatric: Judgment normal.    BP 122/76 (BP Location: Left Arm, Patient Position: Sitting, Cuff Size: Normal)   Pulse 81   Temp (!) 97.5 F (36.4 C) (Oral)   Resp 18   Wt 235 lb 9.6 oz (106.9 kg)   SpO2 98%   BMI 40.44 kg/m  Wt Readings from Last 3 Encounters:  02/01/18 235 lb 9.6 oz (106.9 kg)  12/07/17 230 lb (104.3 kg)  11/02/17 238 lb (108 kg)     Lab Results  Component Value Date   WBC 11.2 (H) 02/01/2018   HGB 15.0 02/01/2018   HCT 45.3 02/01/2018   PLT 414.0 (H) 02/01/2018   GLUCOSE 198 (H) 02/01/2018   CHOL 169 02/01/2018   TRIG 132.0 02/01/2018   HDL 40.60 02/01/2018   LDLDIRECT 100.0 08/03/2017   LDLCALC 102 (H) 02/01/2018   ALT 22 02/01/2018   AST 16 02/01/2018   NA 139 02/01/2018   K 4.2 02/01/2018   CL 100 02/01/2018   CREATININE 0.80 02/01/2018   BUN 13 02/01/2018   CO2 30 02/01/2018   TSH 1.36 02/01/2018   HGBA1C 8.9 (H) 02/01/2018   MICROALBUR 0.9 11/12/2015    Lab  Results  Component Value Date   TSH 1.36 02/01/2018   Lab Results  Component Value Date   WBC 11.2 (H) 02/01/2018   HGB 15.0 02/01/2018   HCT 45.3 02/01/2018   MCV 91.6 02/01/2018   PLT 414.0 (H) 02/01/2018   Lab Results  Component Value Date   NA 139 02/01/2018   K 4.2 02/01/2018   CO2 30 02/01/2018   GLUCOSE 198 (H) 02/01/2018    BUN 13 02/01/2018   CREATININE 0.80 02/01/2018   BILITOT 0.5 02/01/2018   ALKPHOS 91 02/01/2018   AST 16 02/01/2018   ALT 22 02/01/2018   PROT 7.5 02/01/2018   ALBUMIN 4.1 02/01/2018   CALCIUM 10.1 02/01/2018   GFR 95.68 02/01/2018   Lab Results  Component Value Date   CHOL 169 02/01/2018   Lab Results  Component Value Date   HDL 40.60 02/01/2018   Lab Results  Component Value Date   LDLCALC 102 (H) 02/01/2018   Lab Results  Component Value Date   TRIG 132.0 02/01/2018   Lab Results  Component Value Date   CHOLHDL 4 02/01/2018   Lab Results  Component Value Date   HGBA1C 8.9 (H) 02/01/2018       Assessment & Plan:   Problem List Items Addressed This Visit    Diabetes mellitus type 2 in obese ([place])    hgba1c acceptable, minimize simple carbs. Increase exercise as tolerated. Continue current meds      Relevant Medications   metFORMIN (GLUCOPHAGE-XR) 500 MG 24 hr tablet   sitaGLIPtin (JANUVIA) 100 MG tablet   atorvastatin (LIPITOR) 20 MG tablet   liraglutide (VICTOZA) 18 MG/3ML SOPN   glimepiride (AMARYL) 4 MG tablet   dapagliflozin propanediol (FARXIGA) 5 MG TABS tablet   canagliflozin (INVOKANA) 100 MG TABS tablet   Other Relevant Orders   CBC (Completed)   Comprehensive metabolic panel (Completed)   Lipid panel (Completed)   TSH (Completed)   Hyperlipidemia, mixed - Primary    Encouraged heart healthy diet, increase exercise, avoid trans fats, consider a krill oil cap daily. Tolerating Atorvastatin      Relevant Medications   atorvastatin (LIPITOR) 20 MG tablet   hydrochlorothiazide (HYDRODIURIL) 25 MG tablet   Obesity    Encouraged DASH diet, decrease po intake and increase exercise as tolerated. Needs 7-8 hours of sleep nightly. Avoid trans fats, eat small, frequent meals every 4-5 hours with lean proteins, complex carbs and healthy fats. Minimize simple carbs      Relevant Medications   metFORMIN (GLUCOPHAGE-XR) 500 MG 24 hr tablet    sitaGLIPtin (JANUVIA) 100 MG tablet   liraglutide (VICTOZA) 18 MG/3ML SOPN   glimepiride (AMARYL) 4 MG tablet   dapagliflozin propanediol (FARXIGA) 5 MG TABS tablet   canagliflozin (INVOKANA) 100 MG TABS tablet   Essential hypertension    Well controlled, no changes to meds. Encouraged heart healthy diet such as the DASH diet and exercise as tolerated.       Relevant Medications   atorvastatin (LIPITOR) 20 MG tablet   hydrochlorothiazide (HYDRODIURIL) 25 MG tablet   LLQ pain    Due to persistent nature will proceed with CT abd/pelvis to further evaluate. She will report worsening symptoms.        Other Visit Diagnoses    Hyperlipidemia, unspecified hyperlipidemia type       Relevant Medications   atorvastatin (LIPITOR) 20 MG tablet   hydrochlorothiazide (HYDRODIURIL) 25 MG tablet   Other Relevant Orders   Hemoglobin A1c (Completed)  CBC (Completed)   Abdominal pain, unspecified abdominal location       Relevant Orders   CT Abdomen Pelvis W Contrast      I have discontinued Gwenn Prosperi's multivitamin, UNIFINE PENTIPS, and methylPREDNISolone. I have changed her JANUVIA to sitaGLIPtin and INVOKANA to canagliflozin. I have also changed her glimepiride and pantoprazole. Additionally, I am having her start on hyoscyamine. Lastly, I am having her maintain her Lancet Device, aspirin, nitroGLYCERIN, ACCU-CHEK GUIDE, carvedilol, carvedilol, meclizine, promethazine, metFORMIN, tiZANidine, atorvastatin, liraglutide, albuterol, hydrochlorothiazide, dapagliflozin propanediol, potassium chloride SA, traMADol, naproxen, glucose blood, and ACCU-CHEK FASTCLIX LANCETS.  Meds ordered this encounter  Medications  . meclizine (ANTIVERT) 25 MG tablet    Sig: Take 1 tablet (25 mg total) by mouth 3 (three) times daily as needed for dizziness.    Dispense:  30 tablet    Refill:  0  . promethazine (PHENERGAN) 25 MG tablet    Sig: Take 1 tablet (25 mg total) by mouth every 8 (eight) hours as needed  for nausea or vomiting.    Dispense:  20 tablet    Refill:  0  . metFORMIN (GLUCOPHAGE-XR) 500 MG 24 hr tablet    Sig: TAKE 2 TABLETS BY MOUTH TWICE DAILY AND 1 TABLET AT NOON    Dispense:  150 tablet    Refill:  5  . tiZANidine (ZANAFLEX) 4 MG tablet    Sig: Take 1 tablet (4 mg total) by mouth every 6 (six) hours as needed for muscle spasms.    Dispense:  40 tablet    Refill:  1  . sitaGLIPtin (JANUVIA) 100 MG tablet    Sig: Take 1 tablet (100 mg total) by mouth daily.    Dispense:  30 tablet    Refill:  6  . atorvastatin (LIPITOR) 20 MG tablet    Sig: Take 1 tablet (20 mg total) by mouth daily.    Dispense:  90 tablet    Refill:  3  . liraglutide (VICTOZA) 18 MG/3ML SOPN    Sig: Inject 0.3 mLs (1.8 mg total) into the skin daily.    Dispense:  6 mL    Refill:  6  . albuterol (PROVENTIL HFA;VENTOLIN HFA) 108 (90 Base) MCG/ACT inhaler    Sig: Inhale 2 puffs into the lungs every 6 (six) hours as needed for wheezing or shortness of breath.    Dispense:  1 Inhaler    Refill:  6  . hydrochlorothiazide (HYDRODIURIL) 25 MG tablet    Sig: Take 1 tablet (25 mg total) by mouth daily.    Dispense:  90 tablet    Refill:  5  . glimepiride (AMARYL) 4 MG tablet    Sig: Take 1 tablet (4 mg total) by mouth 2 (two) times daily.    Dispense:  60 tablet    Refill:  6  . dapagliflozin propanediol (FARXIGA) 5 MG TABS tablet    Sig: Take 5 mg by mouth daily.    Dispense:  30 tablet    Refill:  3  . canagliflozin (INVOKANA) 100 MG TABS tablet    Sig: Take 1 tablet (100 mg total) by mouth daily.    Dispense:  30 tablet    Refill:  2  . pantoprazole (PROTONIX) 40 MG tablet    Sig: Take 1 tablet (40 mg total) by mouth daily.    Dispense:  30 tablet    Refill:  5  . potassium chloride SA (K-DUR,KLOR-CON) 20 MEQ tablet    Sig: Take 1  tablet (20 mEq total) by mouth daily.    Dispense:  30 tablet    Refill:  3  . traMADol (ULTRAM) 50 MG tablet    Sig: TAKE 2 TABLETS BY MOUTH TWICE DAILY AS  NEEDED    Dispense:  60 tablet    Refill:  0  . naproxen (NAPROSYN) 500 MG tablet    Sig: Take 1 tablet (500 mg total) by mouth 2 (two) times daily as needed for mild pain or moderate pain (with food).    Dispense:  60 tablet    Refill:  1  . glucose blood (ACCU-CHEK GUIDE) test strip    Sig: Use as directed twice daily to check blood sugar.  DX E11.9    Dispense:  100 each    Refill:  6  . ACCU-CHEK FASTCLIX LANCETS MISC    Sig: Use as directed twice daily to check blood sugar.  DX E11.9    Dispense:  102 each    Refill:  6  . hyoscyamine (LEVSIN SL) 0.125 MG SL tablet    Sig: Place 1 tablet (0.125 mg total) under the tongue every 4 (four) hours as needed.    Dispense:  30 tablet    Refill:  0    Penni Homans, MD

## 2018-02-04 NOTE — Assessment & Plan Note (Signed)
Due to persistent nature will proceed with CT abd/pelvis to further evaluate. She will report worsening symptoms.

## 2018-02-05 NOTE — Telephone Encounter (Signed)
OK to switch the potassium to the liquid as requested at same strength.

## 2018-02-05 NOTE — Telephone Encounter (Signed)
Please advise 

## 2018-02-05 NOTE — Telephone Encounter (Signed)
Pharm called - they are asking to change the potassium to the liquid, it does not cause an interaction.  cb is [phone]

## 2018-02-06 ENCOUNTER — Ambulatory Visit (HOSPITAL_BASED_OUTPATIENT_CLINIC_OR_DEPARTMENT_OTHER)
Admission: RE | Admit: 2018-02-06 | Discharge: 2018-02-06 | Disposition: A | Discharge status: Home / Self Care | Payer: 59 | Source: Ambulatory Visit | Attending: Family Medicine | Admitting: Family Medicine

## 2018-02-06 ENCOUNTER — Encounter (HOSPITAL_BASED_OUTPATIENT_CLINIC_OR_DEPARTMENT_OTHER): Payer: Self-pay

## 2018-02-06 DIAGNOSIS — I700 Atherosclerosis of aorta: Secondary | ICD-10-CM | POA: Diagnosis not present

## 2018-02-06 DIAGNOSIS — R933 Abnormal findings on diagnostic imaging of other parts of digestive tract: Secondary | ICD-10-CM | POA: Diagnosis not present

## 2018-02-06 DIAGNOSIS — R109 Unspecified abdominal pain: Secondary | ICD-10-CM | POA: Diagnosis not present

## 2018-02-06 DIAGNOSIS — K429 Umbilical hernia without obstruction or gangrene: Secondary | ICD-10-CM | POA: Diagnosis not present

## 2018-02-06 DIAGNOSIS — E279 Disorder of adrenal gland, unspecified: Secondary | ICD-10-CM | POA: Diagnosis not present

## 2018-02-06 MED ORDER — IOPAMIDOL (ISOVUE-300) INJECTION 61%
100.0000 mL | Freq: Once | INTRAVENOUS | Status: AC | PRN
  Administered 2018-02-06: 100 mL via INTRAVENOUS

## 2018-02-08 ENCOUNTER — Telehealth: Payer: Self-pay | Admitting: Family Medicine

## 2018-02-08 MED FILL — POTASSIUM CL ER 20 MEQ TABL: 20 | 30 days supply | Qty: 30 | Fill #0

## 2018-02-08 NOTE — Telephone Encounter (Signed)
Copied from [place] [id number]. Topic: Quick Communication - Rx Refill/Question >> Feb 08, 2018 12:00 PM Neva Seat wrote: potassium chloride SA (K-DUR,KLOR-CON) 20 MEQ tablet  will stick with the tablet and dissolve it in water.  Needing verbal order for pt to change back to  tab instead of liquid.  Audrea Muscat [phone] [facility]

## 2018-02-08 NOTE — Telephone Encounter (Signed)
Spoke with pharmacist she stated liquid ended up costing pt too much so she decided to keep original order

## 2018-02-08 NOTE — Telephone Encounter (Signed)
Will route to office for provider review. 

## 2018-02-09 NOTE — Telephone Encounter (Signed)
Verbal orders have been given  

## 2018-02-13 ENCOUNTER — Telehealth: Payer: Self-pay

## 2018-02-13 DIAGNOSIS — R9389 Abnormal findings on diagnostic imaging of other specified body structures: Secondary | ICD-10-CM

## 2018-02-13 DIAGNOSIS — K6289 Other specified diseases of anus and rectum: Secondary | ICD-10-CM

## 2018-02-13 DIAGNOSIS — R1032 Left lower quadrant pain: Secondary | ICD-10-CM

## 2018-02-13 NOTE — Telephone Encounter (Signed)
Author phoned pt. To review CT results per pt. Request, but no answer. Will route to [place], [place] to follow-up.  Copied from [place] [id number]. Topic: Quick Communication - Other Results >> Feb 13, 2018  7:41 AM Scherrie Gerlach wrote: Pt would like a call back about her cat scan results. She does not understand the [employer]

## 2018-02-14 ENCOUNTER — Telehealth: Payer: Self-pay | Admitting: Family Medicine

## 2018-02-14 NOTE — Telephone Encounter (Signed)
Patient notified of results.  She has an appointment with GYN on 02/27/18 will send results over. Referral also placed for GI.

## 2018-02-14 NOTE — Addendum Note (Signed)
Addended by: Kem Boroughs D on: 02/14/2018 11:39 AM   Modules accepted: Orders

## 2018-02-14 NOTE — Telephone Encounter (Signed)
Copied from [place] [phone]. Topic: Inquiry >> Feb 13, 2018  8:35 AM Pricilla Handler wrote: Reason for CRM: Patient called back for her lab results.

## 2018-02-14 NOTE — Telephone Encounter (Signed)
Notes recorded by Mosie Lukes, MD on 02/07/2018 at 10:56 PM EDT Notify shows inflammation in rectal area and near pelvic floor. Can refer to GI and GYN to examine if she is willing

## 2018-02-14 NOTE — Telephone Encounter (Signed)
See TE from 5/28

## 2018-02-14 NOTE — Telephone Encounter (Signed)
Patient calling stating that the GI office says they can only do a colonoscopy but Dr. Randel Pigg needs to get in touch with Dr. Collene Mares first? Patient unsure of what they need.

## 2018-02-14 NOTE — Telephone Encounter (Signed)
Left message on voice mail  to call back

## 2018-02-16 MED FILL — JANUVIA 100 MG TABLET: 100 | 30 days supply | Qty: 30 | Fill #0

## 2018-02-16 MED FILL — PANTOPRAZOLE SOD DR 40 MG T: 40 | 30 days supply | Qty: 30 | Fill #2

## 2018-02-16 MED FILL — CARVEDILOL 3.125 MG TABLET: 3.125 | 30 days supply | Qty: 60 | Fill #2

## 2018-02-16 NOTE — Telephone Encounter (Signed)
Called patient left message for patient to call the office back  

## 2018-02-20 DIAGNOSIS — K219 Gastro-esophageal reflux disease without esophagitis: Secondary | ICD-10-CM | POA: Diagnosis not present

## 2018-02-20 DIAGNOSIS — Z86010 Personal history of colonic polyps: Secondary | ICD-10-CM | POA: Diagnosis not present

## 2018-02-20 DIAGNOSIS — Z1211 Encounter for screening for malignant neoplasm of colon: Secondary | ICD-10-CM | POA: Diagnosis not present

## 2018-02-20 DIAGNOSIS — R1032 Left lower quadrant pain: Secondary | ICD-10-CM | POA: Diagnosis not present

## 2018-02-20 DIAGNOSIS — K5730 Diverticulosis of large intestine without perforation or abscess without bleeding: Secondary | ICD-10-CM | POA: Diagnosis not present

## 2018-02-21 MED FILL — GAVILYTE-G SOLUTION: 236 | 1 days supply | Qty: 4000 | Fill #0

## 2018-02-22 ENCOUNTER — Telehealth: Payer: Self-pay | Admitting: *Deleted

## 2018-02-22 NOTE — Telephone Encounter (Signed)
Copied from [place] [id number]. Topic: General - Other >> Feb 21, 2018  8:57 AM Oneta Rack wrote: Relation to pt: self Call back [phone]   Reason for call:  Patient would like a follow up call when [facility] paperwork is received, forms faxed 02/20/18  >> Feb 21, 2018  9:00 AM Oneta Rack wrote: Relation to pt: self Call back [phone]   Reason for call:  Patient would like a follow up call when [facility] paperwork is received, forms faxed 02/20/18

## 2018-02-26 NOTE — Telephone Encounter (Signed)
Patient is calling to check and see if the office has received FMLA paper work yet. Please contact.   [phone]

## 2018-02-26 NOTE — Telephone Encounter (Signed)
Paperwork was faxed to our office on 02/20/18; the standard wait time on all paperwork is 5-7 Business days. Today is the 4th Business day since receiving patient's paperwork, all employees are to be aware of and informing patients that call this information, d/t most of them are calling early and we will communicate with patients when their paperwork has been forwarded to provider, returned complete from provider and ready to be p/u and/or faxed. Thanks/SLS 06/10 Completed as much as possible; forwarded to provider/SLS 06/10

## 2018-03-01 MED FILL — VICTOZA 18 MG/3 ML INJECT P: 18 | 30 days supply | Qty: 9 | Fill #3

## 2018-03-02 NOTE — Telephone Encounter (Signed)
Pt. Is checking to make sure FMLA paperwork was filled out and faxed   Please call and let her know

## 2018-03-02 NOTE — Telephone Encounter (Signed)
Patient informed successfully faxed on 02/28/18, and has been accepted by [employer]; pt understood & agreed/SLS 06/14

## 2018-03-07 DIAGNOSIS — D125 Benign neoplasm of sigmoid colon: Secondary | ICD-10-CM | POA: Diagnosis not present

## 2018-03-07 DIAGNOSIS — D123 Benign neoplasm of transverse colon: Secondary | ICD-10-CM | POA: Diagnosis not present

## 2018-03-07 DIAGNOSIS — Z1211 Encounter for screening for malignant neoplasm of colon: Secondary | ICD-10-CM | POA: Diagnosis not present

## 2018-03-07 DIAGNOSIS — K635 Polyp of colon: Secondary | ICD-10-CM | POA: Diagnosis not present

## 2018-03-07 DIAGNOSIS — R933 Abnormal findings on diagnostic imaging of other parts of digestive tract: Secondary | ICD-10-CM | POA: Diagnosis not present

## 2018-03-07 DIAGNOSIS — D122 Benign neoplasm of ascending colon: Secondary | ICD-10-CM | POA: Diagnosis not present

## 2018-03-07 DIAGNOSIS — K621 Rectal polyp: Secondary | ICD-10-CM | POA: Diagnosis not present

## 2018-03-07 LAB — HM COLONOSCOPY

## 2018-03-16 ENCOUNTER — Encounter: Payer: Self-pay | Admitting: Family Medicine

## 2018-03-16 MED FILL — FARXIGA 5 MG TABLET: 5 | 30 days supply | Qty: 30 | Fill #1

## 2018-03-16 MED FILL — GLIMEPIRIDE 4 MG TABLET: 4 | 30 days supply | Qty: 60 | Fill #1

## 2018-03-16 MED FILL — PANTOPRAZOLE SOD DR 40 MG T: 40 | 30 days supply | Qty: 30 | Fill #3

## 2018-03-16 MED FILL — METFORMIN HCL ER 500 MG TAB: 500 | 30 days supply | Qty: 150 | Fill #1

## 2018-03-16 MED FILL — HYDROCHLOROTHIAZIDE 25 MG T: 25 | 90 days supply | Qty: 90 | Fill #0

## 2018-03-16 MED FILL — CARVEDILOL 3.125 MG TABLET: 3.125 | 30 days supply | Qty: 60 | Fill #3

## 2018-03-16 MED FILL — JANUVIA 100 MG TABLET: 100 | 30 days supply | Qty: 30 | Fill #1

## 2018-03-29 DIAGNOSIS — Z1231 Encounter for screening mammogram for malignant neoplasm of breast: Secondary | ICD-10-CM | POA: Diagnosis not present

## 2018-04-10 ENCOUNTER — Telehealth: Payer: Self-pay | Admitting: *Deleted

## 2018-04-10 NOTE — Telephone Encounter (Signed)
Got a message about referral for patient for GI at Dr. Collene Mares.  It looks like she is already established and that she does not need a referral she can call and make referral herself.  Left message on machine that she needs to call and make appointment.

## 2018-04-23 MED FILL — VICTOZA 2-PAK 18 MG/3 ML PE: 18 | 20 days supply | Qty: 6 | Fill #4

## 2018-04-23 MED FILL — UNIFINE PENTIPS 31GX3/16: 31G X 5 MM | 90 days supply | Qty: 100 | Fill #2

## 2018-04-23 MED FILL — CARVEDILOL 3.125 MG TABLET: 3.125 | 30 days supply | Qty: 60 | Fill #4

## 2018-04-23 MED FILL — FARXIGA 5 MG TABLET: 5 | 30 days supply | Qty: 30 | Fill #2

## 2018-04-23 MED FILL — GLIMEPIRIDE 4 MG TABLET: 4 | 30 days supply | Qty: 60 | Fill #2

## 2018-04-23 MED FILL — UNIFINE PENTIPS 31GX3/16": 31G X 5 MM | 90 days supply | Qty: 100 | Fill #2

## 2018-04-23 MED FILL — JANUVIA 100 MG TABLET: 100 | 30 days supply | Qty: 30 | Fill #2

## 2018-04-23 MED FILL — METFORMIN HCL ER 500 MG TAB: 500 | 30 days supply | Qty: 150 | Fill #2

## 2018-04-26 DIAGNOSIS — Z01419 Encounter for gynecological examination (general) (routine) without abnormal findings: Secondary | ICD-10-CM | POA: Diagnosis not present

## 2018-04-26 DIAGNOSIS — Z6841 Body Mass Index (BMI) 40.0 and over, adult: Secondary | ICD-10-CM | POA: Diagnosis not present

## 2018-05-15 ENCOUNTER — Encounter: Payer: Self-pay | Admitting: Family Medicine

## 2018-05-15 ENCOUNTER — Ambulatory Visit: Payer: 59 | Admitting: Family Medicine

## 2018-05-15 VITALS — BP 132/86 | HR 89 | Temp 98.3°F | Resp 18 | Ht 64.0 in | Wt 239.0 lb

## 2018-05-15 DIAGNOSIS — E1169 Type 2 diabetes mellitus with other specified complication: Secondary | ICD-10-CM | POA: Diagnosis not present

## 2018-05-15 DIAGNOSIS — E669 Obesity, unspecified: Secondary | ICD-10-CM

## 2018-05-15 DIAGNOSIS — R109 Unspecified abdominal pain: Secondary | ICD-10-CM | POA: Diagnosis not present

## 2018-05-15 DIAGNOSIS — E6609 Other obesity due to excess calories: Secondary | ICD-10-CM

## 2018-05-15 DIAGNOSIS — I10 Essential (primary) hypertension: Secondary | ICD-10-CM

## 2018-05-15 DIAGNOSIS — Z23 Encounter for immunization: Secondary | ICD-10-CM | POA: Diagnosis not present

## 2018-05-15 DIAGNOSIS — E782 Mixed hyperlipidemia: Secondary | ICD-10-CM | POA: Diagnosis not present

## 2018-05-15 MED ORDER — HYDROCHLOROTHIAZIDE 25 MG PO TABS: 25.0000 mg | ORAL_TABLET | Freq: Every day | ORAL | 5 refills | Status: DC

## 2018-05-15 MED ORDER — SITAGLIPTIN PHOSPHATE 100 MG PO TABS: 100.0000 mg | ORAL_TABLET | Freq: Every day | ORAL | 6 refills | Status: DC

## 2018-05-15 MED ORDER — ATORVASTATIN CALCIUM 20 MG PO TABS: 20.0000 mg | ORAL_TABLET | Freq: Every day | ORAL | 3 refills | Status: DC

## 2018-05-15 MED ORDER — POTASSIUM CHLORIDE CRYS ER 20 MEQ PO TBCR: 20.0000 meq | EXTENDED_RELEASE_TABLET | Freq: Every day | ORAL | 3 refills | Status: DC

## 2018-05-15 MED ORDER — GLIMEPIRIDE 4 MG PO TABS: 4.0000 mg | ORAL_TABLET | Freq: Two times a day (BID) | ORAL | 6 refills | Status: DC

## 2018-05-15 MED ORDER — CARVEDILOL 3.125 MG PO TABS: 3.1250 mg | ORAL_TABLET | Freq: Two times a day (BID) | ORAL | 11 refills | Status: DC

## 2018-05-15 MED ORDER — PANTOPRAZOLE SODIUM 40 MG PO TBEC: 40.0000 mg | DELAYED_RELEASE_TABLET | Freq: Every day | ORAL | 5 refills | Status: DC

## 2018-05-15 MED ORDER — DAPAGLIFLOZIN PROPANEDIOL 5 MG PO TABS: 5.0000 mg | ORAL_TABLET | Freq: Every day | ORAL | 3 refills | Status: DC

## 2018-05-15 MED ORDER — BASAGLAR KWIKPEN 100 UNIT/ML ~~LOC~~ SOPN: 10.0000 [IU] | PEN_INJECTOR | Freq: Every day | SUBCUTANEOUS | 2 refills | Status: DC

## 2018-05-15 MED ORDER — LIRAGLUTIDE 18 MG/3ML ~~LOC~~ SOPN: 1.8000 mg | PEN_INJECTOR | Freq: Every day | SUBCUTANEOUS | 6 refills | Status: DC

## 2018-05-15 MED ORDER — ALBUTEROL SULFATE HFA 108 (90 BASE) MCG/ACT IN AERS: 2.0000 | INHALATION_SPRAY | Freq: Four times a day (QID) | RESPIRATORY_TRACT | 6 refills | Status: DC | PRN

## 2018-05-15 MED FILL — PANTOPRAZOLE SOD DR 40 MG T: 40 | 30 days supply | Qty: 30 | Fill #0

## 2018-05-15 MED FILL — VENTOLIN HFA 90 MCG INHALER: 108 (90 BAS | 25 days supply | Qty: 18 | Fill #0

## 2018-05-15 MED FILL — VICTOZA 2-PAK 18 MG/3 ML PE: 18 | 20 days supply | Qty: 6 | Fill #0

## 2018-05-15 MED FILL — ATORVASTATIN CALCIUM 20 MG: 20 | 90 days supply | Qty: 90 | Fill #0

## 2018-05-15 NOTE — Assessment & Plan Note (Signed)
Encouraged heart healthy diet, increase exercise, avoid trans fats, consider a krill oil cap daily 

## 2018-05-15 NOTE — Assessment & Plan Note (Signed)
Well controlled, no changes to meds. Encouraged heart healthy diet such as the DASH diet and exercise as tolerated.  °

## 2018-05-15 NOTE — Assessment & Plan Note (Signed)
LLQ pain improved but still present try the hyoscyamine and document pattern of when it occurs.

## 2018-05-15 NOTE — Assessment & Plan Note (Signed)
hgba1c too high, repeat today, minimize simple carbs. Increase exercise as tolerated. Continue current meds

## 2018-05-15 NOTE — Patient Instructions (Signed)
Insulin Glargine injection What is this medicine? INSULIN GLARGINE (IN su lin GLAR geen) is a human-made form of insulin. This drug lowers the amount of sugar in your blood. It is a long-acting insulin that is usually given once a day. This medicine may be used for other purposes; ask your health care provider or pharmacist if you have questions. COMMON BRAND NAME(S): BASAGLAR, Lantus, Lantus SoloStar, Toujeo SoloStar What should I tell my health care provider before I take this medicine? They need to know if you have any of these conditions: -episodes of low blood sugar -kidney disease -liver disease -an unusual or allergic reaction to insulin, metacresol, other medicines, foods, dyes, or preservatives -pregnant or trying to get pregnant -breast-feeding How should I use this medicine? This medicine is for injection under the skin. Use this medicine at the same time each day. Use exactly as directed. This insulin should never be mixed in the same syringe with other insulins before injection. Do not vigorously shake before use. You will be taught how to use this medicine and how to adjust doses for activities and illness. Do not use more insulin than prescribed. Always check the appearance of your insulin before using it. This medicine should be clear and colorless like water. Do not use it if it is cloudy, thickened, colored, or has solid particles in it. It is important that you put your used needles and syringes in a special sharps container. Do not put them in a trash can. If you do not have a sharps container, call your pharmacist or healthcare provider to get one. Talk to your pediatrician regarding the use of this medicine in children. Special care may be needed. Overdosage: If you think you have taken too much of this medicine contact a poison control center or emergency room at once. NOTE: This medicine is only for you. Do not share this medicine with others. What if I miss a dose? It is  important not to miss a dose. Your health care professional or doctor should discuss a plan for missed doses with you. If you do miss a dose, follow their plan. Do not take double doses. What may interact with this medicine? -other medicines for diabetes Many medications may cause changes in blood sugar, these include: -alcohol containing beverages -antiviral medicines for HIV or AIDS -aspirin and aspirin-like drugs -certain medicines for blood pressure, heart disease, irregular heart beat -chromium -diuretics -female hormones, such as estrogens or progestins, birth control pills -fenofibrate -gemfibrozil -isoniazid -lanreotide -female hormones or anabolic steroids -MAOIs like Carbex, Eldepryl, Marplan, Nardil, and Parnate -medicines for weight loss -medicines for allergies, asthma, cold, or cough -medicines for depression, anxiety, or psychotic disturbances -niacin -nicotine -NSAIDs, medicines for pain and inflammation, like ibuprofen or naproxen -octreotide -pasireotide -pentamidine -phenytoin -probenecid -quinolone antibiotics such as ciprofloxacin, levofloxacin, ofloxacin -some herbal dietary supplements -steroid medicines such as prednisone or cortisone -sulfamethoxazole; trimethoprim -thyroid hormones Some medications can hide the warning symptoms of low blood sugar (hypoglycemia). You may need to monitor your blood sugar more closely if you are taking one of these medications. These include: -beta-blockers, often used for high blood pressure or heart problems (examples include atenolol, metoprolol, propranolol) -clonidine -guanethidine -reserpine This list may not describe all possible interactions. Give your health care provider a list of all the medicines, herbs, non-prescription drugs, or dietary supplements you use. Also tell them if you smoke, drink alcohol, or use illegal drugs. Some items may interact with your medicine. What should I watch for   while using this  medicine? Visit your health care professional or doctor for regular checks on your progress. Do not drive, use machinery, or do anything that needs mental alertness until you know how this medicine affects you. Alcohol may interfere with the effect of this medicine. Avoid alcoholic drinks. A test called the HbA1C (A1C) will be monitored. This is a simple blood test. It measures your blood sugar control over the last 2 to 3 months. You will receive this test every 3 to 6 months. Learn how to check your blood sugar. Learn the symptoms of low and high blood sugar and how to manage them. Always carry a quick-source of sugar with you in case you have symptoms of low blood sugar. Examples include hard sugar candy or glucose tablets. Make sure others know that you can choke if you eat or drink when you develop serious symptoms of low blood sugar, such as seizures or unconsciousness. They must get medical help at once. Tell your doctor or health care professional if you have high blood sugar. You might need to change the dose of your medicine. If you are sick or exercising more than usual, you might need to change the dose of your medicine. Do not skip meals. Ask your doctor or health care professional if you should avoid alcohol. Many nonprescription cough and cold products contain sugar or alcohol. These can affect blood sugar. Make sure that you have the right kind of syringe for the type of insulin you use. Try not to change the brand and type of insulin or syringe unless your health care professional or doctor tells you to. Switching insulin brand or type can cause dangerously high or low blood sugar. Always keep an extra supply of insulin, syringes, and needles on hand. Use a syringe one time only. Throw away syringe and needle in a closed container to prevent accidental needle sticks. Insulin pens and cartridges should never be shared. Even if the needle is changed, sharing may result in passing of viruses  like hepatitis or HIV. Wear a medical ID bracelet or chain, and carry a card that describes your disease and details of your medicine and dosage times. What side effects may I notice from receiving this medicine? Side effects that you should report to your doctor or health care professional as soon as possible: -allergic reactions like skin rash, itching or hives, swelling of the face, lips, or tongue -breathing problems -signs and symptoms of high blood sugar such as dizziness, dry mouth, dry skin, fruity breath, nausea, stomach pain, increased hunger or thirst, increased urination -signs and symptoms of low blood sugar such as feeling anxious, confusion, dizziness, increased hunger, unusually weak or tired, sweating, shakiness, cold, irritable, headache, blurred vision, fast heartbeat, loss of consciousness Side effects that usually do not require medical attention (report to your doctor or health care professional if they continue or are bothersome): -increase or decrease in fatty tissue under the skin due to overuse of a particular injection site -itching, burning, swelling, or rash at site where injected This list may not describe all possible side effects. Call your doctor for medical advice about side effects. You may report side effects to FDA at 1-800-FDA-1088. Where should I keep my medicine? Keep out of the reach of children. Store unopened vials in a refrigerator between 2 and 8 degrees C (36 and 46 degrees F). Do not freeze or use if the insulin has been frozen. Opened vials (vials currently in use) may be stored   in the refrigerator or at room temperature, at approximately 25 degrees C (77 degrees F) or cooler. Keeping your insulin at room temperature decreases the amount of pain during injection. Once opened, your insulin can be used for 28 days. After 28 days, the vial should be thrown away. Store Lantus Solostar Pens or Basaglar KwikPens in a refrigerator between 2 and 8 degrees C (36  and 46 degrees F) or at room temperature below 30 degrees C (86 degrees F). Do not freeze or use if the insulin has been frozen. Once opened, the pens should be kept at room temperature. Do not store in the refrigerator once opened. Once opened, the insulin can be used for 28 days. After 28 days, the Lantus Solostar Pen or Basaglar KwikPen should be thrown away. Store Toujeo Solostar Pens in a refrigerator between 2 and 8 degrees C (36 and 46 degrees F). Do not freeze or use if the insulin has been frozen. Once opened, the pens should be kept at room temperature below 30 degrees C (86 degrees F). Do not store in the refrigerator once opened. Once opened, the insulin can be used for 42 days. After 42 days, the Toujeo Solostar Pen should be thrown away. Protect from light and excessive heat. Throw away any unused medicine after the expiration date or after the specified time for room temperature storage has passed. NOTE: This sheet is a summary. It may not cover all possible information. If you have questions about this medicine, talk to your doctor, pharmacist, or health care provider.  2018 [employer] (2016-09-21 10:26:25)  

## 2018-05-15 NOTE — Progress Notes (Signed)
Subjective:  I acted as a Education administrator for Dr. Charlett Blake. Eileen Richards, [place]  Patient ID: Eileen Richards, female    DOB: 05-31-1961, 57 y.o.   MRN: [id number]  No chief complaint on file.   HPI  Patient is in today for 3 month follow up and she continues to struggle with some intermittent abdominal pain.  Mot notably in the Left lower quadrant. She has gotten some relief from Tramadol but then pain returns. No fevers or chills. No bloody or tarry stooll. No dysuria or hematuria. .sugars continue to run high. Well controlled, no changes to meds. Encouraged heart healthy diet such as the DASH diet and exercise as tolerated. Denies CP/palp/SOB/HA/congestion/fevers or GU c/o. Taking meds as prescribed Patient Care Team: Mosie Lukes, MD as PCP - General (Family Medicine) Servando Salina, MD as Consulting Physician (Obstetrics and Gynecology) Juanita Craver, MD as Consulting Physician (Gastroenterology)   Past Medical History:  Diagnosis Date  . Achilles tendonitis 10/14/2012  . Asthma    childhood  . Atypical chest pain    normal stress echo 02/2009 -LV size was normal LV global systolic function was normal- Normal wall motion; no LV regional wall motion abnormalities  . Benign paroxysmal positional vertigo 04/26/2015  . Chest pain, atypical 08/24/2013  . Diabetes mellitus   . GERD (gastroesophageal reflux disease)   . Hand pain, left 11/10/2015  . Hyperlipemia   . Hypertension   . Neuropathic pain of ankle, left 04/27/2017  . Onychomycosis 04/17/2014  . Pain of right heel 10/14/2012  . Preventative health care 11/22/2015  . Sinusitis, acute 01/17/2016  . Thoracic outlet syndrome 11/03/2014  . Urinary urgency 12/15/2015    Past Surgical History:  Procedure Laterality Date  . ABDOMINAL HYSTERECTOMY    . CYSTOSCOPY  07/23/2012   Procedure: CYSTOSCOPY;  Surgeon: Marvene Staff, MD;  Location: [facility];  Service: Gynecology;  Laterality: N/A;  . DILATION AND CURETTAGE OF UTERUS     Lysis of  Adhesions  . ROBOTIC ASSISTED LAPAROSCOPIC LYSIS OF ADHESION  07/23/2012   Procedure: ROBOTIC ASSISTED LAPAROSCOPIC LYSIS OF ADHESION;  Surgeon: Marvene Staff, MD;  Location: [facility];  Service: Gynecology;  Laterality: N/A;  . ROBOTIC ASSISTED SALPINGO OOPHERECTOMY  07/23/2012   Procedure: ROBOTIC ASSISTED SALPINGO OOPHERECTOMY;  Surgeon: Marvene Staff, MD;  Location: [facility];  Service: Gynecology;  Laterality: Bilateral;    Family History  Problem Relation Age of Onset  . GER disease Mother   . GER disease Father   . Heart disease Father        father has "heart problems"  . Anemia Father     Social History   Socioeconomic History  . Marital status: Single    Spouse name: Not on file  . Number of children: Not on file  . Years of education: Not on file  . Highest education level: Not on file  Occupational History  . Not on file  Social Needs  . Financial resource strain: Not on file  . Food insecurity:    Worry: Not on file    Inability: Not on file  . Transportation needs:    Medical: Not on file    Non-medical: Not on file  Tobacco Use  . Smoking status: Former Research scientist (life sciences)  . Smokeless tobacco: Never Used  Substance and Sexual Activity  . Alcohol use: No  . Drug use: No  . Sexual activity: Not on file  Lifestyle  . Physical activity:    Days per week:  Not on file    Minutes per session: Not on file  . Stress: Not on file  Relationships  . Social connections:    Talks on phone: Not on file    Gets together: Not on file    Attends religious service: Not on file    Active member of club or organization: Not on file    Attends meetings of clubs or organizations: Not on file    Relationship status: Not on file  . Intimate partner violence:    Fear of current or ex partner: Not on file    Emotionally abused: Not on file    Physically abused: Not on file    Forced sexual activity: Not on file  Other Topics Concern  . Not on file  Social History Narrative    . Not on file    Outpatient Medications Prior to Visit  Medication Sig Dispense Refill  . ACCU-CHEK FASTCLIX LANCETS MISC Use as directed twice daily to check blood sugar.  DX E11.9 102 each 6  . aspirin 81 MG tablet Take 81 mg by mouth daily.    Marland Kitchen glucose blood (ACCU-CHEK GUIDE) test strip Use as directed twice daily to check blood sugar.  DX E11.9 100 each 6  . hyoscyamine (LEVSIN SL) 0.125 MG SL tablet Place 1 tablet (0.125 mg total) under the tongue every 4 (four) hours as needed. 30 tablet 0  . Lancet Device MISC by Does not apply route. Use to test blood sugar three times daily     . meclizine (ANTIVERT) 25 MG tablet Take 1 tablet (25 mg total) by mouth 3 (three) times daily as needed for dizziness. 30 tablet 0  . metFORMIN (GLUCOPHAGE-XR) 500 MG 24 hr tablet TAKE 2 TABLETS BY MOUTH TWICE DAILY AND 1 TABLET AT NOON 150 tablet 5  . naproxen (NAPROSYN) 500 MG tablet Take 1 tablet (500 mg total) by mouth 2 (two) times daily as needed for mild pain or moderate pain (with food). 60 tablet 1  . nitroGLYCERIN (NITROSTAT) 0.4 MG SL tablet Place 1 tablet (0.4 mg total) under the tongue every 5 (five) minutes as needed for chest pain. 25 tablet 3  . promethazine (PHENERGAN) 25 MG tablet Take 1 tablet (25 mg total) by mouth every 8 (eight) hours as needed for nausea or vomiting. 20 tablet 0  . tiZANidine (ZANAFLEX) 4 MG tablet Take 1 tablet (4 mg total) by mouth every 6 (six) hours as needed for muscle spasms. 40 tablet 1  . traMADol (ULTRAM) 50 MG tablet TAKE 2 TABLETS BY MOUTH TWICE DAILY AS NEEDED 60 tablet 0  . albuterol (PROVENTIL HFA;VENTOLIN HFA) 108 (90 Base) MCG/ACT inhaler Inhale 2 puffs into the lungs every 6 (six) hours as needed for wheezing or shortness of breath. 1 Inhaler 6  . atorvastatin (LIPITOR) 20 MG tablet Take 1 tablet (20 mg total) by mouth daily. 90 tablet 3  . Blood Glucose Monitoring Suppl (ACCU-CHEK GUIDE) w/Device KIT Inject 1 kit as directed 2 (two) times daily. Use to  check blood sugar.  DX E11.9 1 kit 0  . canagliflozin (INVOKANA) 100 MG TABS tablet Take 1 tablet (100 mg total) by mouth daily. 30 tablet 2  . carvedilol (COREG) 3.125 MG tablet TAKE 1 TABLET BY MOUTH TWICE DAILY WITH MEALS 60 tablet 11  . carvedilol (COREG) 3.125 MG tablet TAKE 1 TABLET BY MOUTH 2 TIMES DAILY WITH A MEAL 60 tablet 6  . dapagliflozin propanediol (FARXIGA) 5 MG TABS tablet Take  5 mg by mouth daily. 30 tablet 3  . glimepiride (AMARYL) 4 MG tablet Take 1 tablet (4 mg total) by mouth 2 (two) times daily. 60 tablet 6  . hydrochlorothiazide (HYDRODIURIL) 25 MG tablet Take 1 tablet (25 mg total) by mouth daily. 90 tablet 5  . liraglutide (VICTOZA) 18 MG/3ML SOPN Inject 0.3 mLs (1.8 mg total) into the skin daily. 6 mL 6  . pantoprazole (PROTONIX) 40 MG tablet Take 1 tablet (40 mg total) by mouth daily. 30 tablet 5  . potassium chloride SA (K-DUR,KLOR-CON) 20 MEQ tablet Take 1 tablet (20 mEq total) by mouth daily. 30 tablet 3  . sitaGLIPtin (JANUVIA) 100 MG tablet Take 1 tablet (100 mg total) by mouth daily. 30 tablet 6   No facility-administered medications prior to visit.     No Known Allergies  Review of Systems  Constitutional: Positive for malaise/fatigue. Negative for fever.  HENT: Negative for congestion.   Eyes: Negative for blurred vision.  Respiratory: Negative for shortness of breath.   Cardiovascular: Negative for chest pain, palpitations and leg swelling.  Gastrointestinal: Positive for abdominal pain. Negative for blood in stool and nausea.  Genitourinary: Negative for dysuria and frequency.  Musculoskeletal: Negative for falls.  Skin: Negative for rash.  Neurological: Negative for dizziness, loss of consciousness and headaches.  Endo/Heme/Allergies: Negative for environmental allergies.  Psychiatric/Behavioral: Negative for depression. The patient is not nervous/anxious.        Objective:    Physical Exam  Constitutional: She is oriented to person, place,  and time. She appears well-developed and well-nourished. No distress.  HENT:  Head: Normocephalic and atraumatic.  Nose: Nose normal.  Eyes: Right eye exhibits no discharge. Left eye exhibits no discharge.  Neck: Normal range of motion. Neck supple.  Cardiovascular: Normal rate and regular rhythm.  No murmur heard. Pulmonary/Chest: Effort normal and breath sounds normal.  Abdominal: Soft. Bowel sounds are normal. There is no tenderness.  Musculoskeletal: She exhibits no edema.  Neurological: She is alert and oriented to person, place, and time.  Skin: Skin is warm and dry.  Psychiatric: She has a normal mood and affect.  Nursing note and vitals reviewed.   BP 132/86 (BP Location: Left Arm, Patient Position: Sitting, Cuff Size: Normal)   Pulse 89   Temp 98.3 F (36.8 C) (Oral)   Resp 18   Ht '5\' 4"'$  (1.626 m)   Wt 239 lb (108.4 kg)   SpO2 98%   BMI 41.02 kg/m  Wt Readings from Last 3 Encounters:  05/15/18 239 lb (108.4 kg)  02/01/18 235 lb 9.6 oz (106.9 kg)  12/07/17 230 lb (104.3 kg)   BP Readings from Last 3 Encounters:  05/15/18 132/86  02/01/18 122/76  12/07/17 123/78     Immunization History  Administered Date(s) Administered  . Influenza Whole 06/22/2010, 07/10/2012, 06/19/2013  . Influenza-Unspecified 06/19/2014, 06/20/2015  . Pneumococcal Conjugate-13 05/13/2016  . Td 08/19/2001    Health Maintenance  Topic Date Due  . PNEUMOCOCCAL POLYSACCHARIDE VACCINE AGE 31-64 HIGH RISK  10/31/1964  . URINE MICROALBUMIN  11/11/2016  . OPHTHALMOLOGY EXAM  10/27/2017  . FOOT EXAM  10/31/2017  . TETANUS/TDAP  03/03/2018  . INFLUENZA VACCINE  04/19/2018  . HEMOGLOBIN A1C  11/15/2018  . MAMMOGRAM  03/18/2019  . COLONOSCOPY  03/07/2028  . Hepatitis C Screening  Completed  . HIV Screening  Completed    Lab Results  Component Value Date   WBC 11.2 (H) 05/15/2018   HGB 14.6 05/15/2018  HCT 44.4 05/15/2018   PLT 414.0 (H) 05/15/2018   GLUCOSE 256 (H) 05/15/2018   CHOL  163 05/15/2018   TRIG 150.0 (H) 05/15/2018   HDL 40.10 05/15/2018   LDLDIRECT 100.0 08/03/2017   LDLCALC 93 05/15/2018   ALT 21 05/15/2018   AST 18 05/15/2018   NA 140 05/15/2018   K 3.9 05/15/2018   CL 98 05/15/2018   CREATININE 0.77 05/15/2018   BUN 11 05/15/2018   CO2 33 (H) 05/15/2018   TSH 1.16 05/15/2018   HGBA1C 9.9 (H) 05/15/2018   MICROALBUR 0.9 11/12/2015    Lab Results  Component Value Date   TSH 1.16 05/15/2018   Lab Results  Component Value Date   WBC 11.2 (H) 05/15/2018   HGB 14.6 05/15/2018   HCT 44.4 05/15/2018   MCV 91.8 05/15/2018   PLT 414.0 (H) 05/15/2018   Lab Results  Component Value Date   NA 140 05/15/2018   K 3.9 05/15/2018   CO2 33 (H) 05/15/2018   GLUCOSE 256 (H) 05/15/2018   BUN 11 05/15/2018   CREATININE 0.77 05/15/2018   BILITOT 0.4 05/15/2018   ALKPHOS 98 05/15/2018   AST 18 05/15/2018   ALT 21 05/15/2018   PROT 7.0 05/15/2018   ALBUMIN 4.0 05/15/2018   CALCIUM 9.8 05/15/2018   GFR 99.89 05/15/2018   Lab Results  Component Value Date   CHOL 163 05/15/2018   Lab Results  Component Value Date   HDL 40.10 05/15/2018   Lab Results  Component Value Date   LDLCALC 93 05/15/2018   Lab Results  Component Value Date   TRIG 150.0 (H) 05/15/2018   Lab Results  Component Value Date   CHOLHDL 4 05/15/2018   Lab Results  Component Value Date   HGBA1C 9.9 (H) 05/15/2018         Assessment & Plan:   Problem List Items Addressed This Visit    Diabetes mellitus type 2 in obese ([place])    hgba1c too high, repeat today, minimize simple carbs. Increase exercise as tolerated. Continue current meds      Relevant Medications   sitaGLIPtin (JANUVIA) 100 MG tablet   atorvastatin (LIPITOR) 20 MG tablet   liraglutide (VICTOZA) 18 MG/3ML SOPN   dapagliflozin propanediol (FARXIGA) 5 MG TABS tablet   glimepiride (AMARYL) 4 MG tablet   Insulin Glargine (BASAGLAR KWIKPEN) 100 UNIT/ML SOPN   Other Relevant Orders   Hemoglobin A1c  (Completed)   Hyperlipidemia, mixed    Encouraged heart healthy diet, increase exercise, avoid trans fats, consider a krill oil cap daily      Relevant Medications   carvedilol (COREG) 3.125 MG tablet   atorvastatin (LIPITOR) 20 MG tablet   hydrochlorothiazide (HYDRODIURIL) 25 MG tablet   Other Relevant Orders   Lipid panel (Completed)   Obesity - Primary    Encouraged DASH diet, decrease po intake and increase exercise as tolerated. Needs 7-8 hours of sleep nightly. Avoid trans fats, eat small, frequent meals every 4-5 hours with lean proteins, complex carbs and healthy fats. Minimize simple carbs, referred to [facility] weight [facility] wellness.       Relevant Medications   sitaGLIPtin (JANUVIA) 100 MG tablet   liraglutide (VICTOZA) 18 MG/3ML SOPN   dapagliflozin propanediol (FARXIGA) 5 MG TABS tablet   glimepiride (AMARYL) 4 MG tablet   Insulin Glargine (BASAGLAR KWIKPEN) 100 UNIT/ML SOPN   Other Relevant Orders   Amb Ref to Medical Weight Management   TSH (Completed)   Essential hypertension  Well controlled, no changes to meds. Encouraged heart healthy diet such as the DASH diet and exercise as tolerated.       Relevant Medications   carvedilol (COREG) 3.125 MG tablet   atorvastatin (LIPITOR) 20 MG tablet   hydrochlorothiazide (HYDRODIURIL) 25 MG tablet   Other Relevant Orders   CBC (Completed)   Comprehensive metabolic panel (Completed)   Abdominal pain    LLQ pain improved but still present try the hyoscyamine and document pattern of when it occurs.         I have discontinued Eileen Richards's carvedilol and canagliflozin. I have also changed her carvedilol. Additionally, I am having her start on [facility]. Lastly, I am having her maintain her Lancet Device, aspirin, nitroGLYCERIN, meclizine, promethazine, metFORMIN, tiZANidine, traMADol, naproxen, glucose blood, ACCU-CHEK FASTCLIX LANCETS, hyoscyamine, sitaGLIPtin, atorvastatin, liraglutide, potassium chloride SA,  pantoprazole, dapagliflozin propanediol, glimepiride, albuterol, and hydrochlorothiazide.  Meds ordered this encounter  Medications  . carvedilol (COREG) 3.125 MG tablet    Sig: Take 1 tablet (3.125 mg total) by mouth 2 (two) times daily with a meal.    Dispense:  60 tablet    Refill:  11  . sitaGLIPtin (JANUVIA) 100 MG tablet    Sig: Take 1 tablet (100 mg total) by mouth daily.    Dispense:  30 tablet    Refill:  6  . atorvastatin (LIPITOR) 20 MG tablet    Sig: Take 1 tablet (20 mg total) by mouth daily.    Dispense:  90 tablet    Refill:  3  . liraglutide (VICTOZA) 18 MG/3ML SOPN    Sig: Inject 0.3 mLs (1.8 mg total) into the skin daily.    Dispense:  6 mL    Refill:  6  . potassium chloride SA (K-DUR,KLOR-CON) 20 MEQ tablet    Sig: Take 1 tablet (20 mEq total) by mouth daily.    Dispense:  30 tablet    Refill:  3  . pantoprazole (PROTONIX) 40 MG tablet    Sig: Take 1 tablet (40 mg total) by mouth daily.    Dispense:  30 tablet    Refill:  5  . dapagliflozin propanediol (FARXIGA) 5 MG TABS tablet    Sig: Take 5 mg by mouth daily.    Dispense:  30 tablet    Refill:  3  . glimepiride (AMARYL) 4 MG tablet    Sig: Take 1 tablet (4 mg total) by mouth 2 (two) times daily.    Dispense:  60 tablet    Refill:  6  . albuterol (PROVENTIL HFA;VENTOLIN HFA) 108 (90 Base) MCG/ACT inhaler    Sig: Inhale 2 puffs into the lungs every 6 (six) hours as needed for wheezing or shortness of breath.    Dispense:  1 Inhaler    Refill:  6  . hydrochlorothiazide (HYDRODIURIL) 25 MG tablet    Sig: Take 1 tablet (25 mg total) by mouth daily.    Dispense:  90 tablet    Refill:  5  . Insulin Glargine (BASAGLAR KWIKPEN) 100 UNIT/ML SOPN    Sig: Inject 0.1 mLs (10 Units total) into the skin daily.    Dispense:  3 pen    Refill:  2    CMA served as scribe during this visit. History, Physical and Plan performed by medical provider. Documentation and orders reviewed and attested to.  Penni Homans,  MD

## 2018-05-16 LAB — COMPREHENSIVE METABOLIC PANEL
ALT: 21 U/L (ref 0–35)
AST: 18 U/L (ref 0–37)
Albumin: 4 g/dL (ref 3.5–5.2)
Alkaline Phosphatase: 98 U/L (ref 39–117)
BUN: 11 mg/dL (ref 6–23)
CO2: 33 mEq/L — ABNORMAL HIGH (ref 19–32)
Calcium: 9.8 mg/dL (ref 8.4–10.5)
Chloride: 98 mEq/L (ref 96–112)
Creatinine, Ser: 0.77 mg/dL (ref 0.40–1.20)
GFR: 99.89 mL/min (ref >60.00)
Glucose, Bld: 256 mg/dL — ABNORMAL HIGH (ref 70–99)
Potassium: 3.9 mEq/L (ref 3.5–5.1)
Sodium: 140 mEq/L (ref 135–145)
Total Bilirubin: 0.4 mg/dL (ref 0.2–1.2)
Total Protein: 7 g/dL (ref 6.0–8.3)

## 2018-05-16 LAB — CBC
HCT: 44.4 % (ref 36.0–46.0)
Hemoglobin: 14.6 g/dL (ref 12.0–15.0)
MCHC: 32.8 g/dL (ref 30.0–36.0)
MCV: 91.8 fl (ref 78.0–100.0)
Platelets: 414 10*3/uL — ABNORMAL HIGH (ref 150.0–400.0)
RBC: 4.84 Mil/uL (ref 3.87–5.11)
RDW: 14 % (ref 11.5–15.5)
WBC: 11.2 10*3/uL — ABNORMAL HIGH (ref 4.0–10.5)

## 2018-05-16 LAB — LIPID PANEL
Cholesterol: 163 mg/dL (ref 0–200)
HDL: 40.1 mg/dL (ref >39.00)
LDL Cholesterol: 93 mg/dL (ref 0–99)
NonHDL: 122.83
Total CHOL/HDL Ratio: 4
Triglycerides: 150 mg/dL — ABNORMAL HIGH (ref 0.0–149.0)
VLDL: 30 mg/dL (ref 0.0–40.0)

## 2018-05-16 LAB — TSH: TSH: 1.16 u[IU]/mL (ref 0.35–4.50)

## 2018-05-16 LAB — HEMOGLOBIN A1C: Hgb A1c MFr Bld: 9.9 % — ABNORMAL HIGH (ref 4.6–6.5)

## 2018-05-17 MED FILL — FARXIGA 5 MG TABLET: 5 | 30 days supply | Qty: 30 | Fill #0

## 2018-05-17 MED FILL — GLIMEPIRIDE 4 MG TABLET: 4 | 30 days supply | Qty: 60 | Fill #0

## 2018-05-17 MED FILL — JANUVIA 100 MG TABLET: 100 | 30 days supply | Qty: 30 | Fill #0

## 2018-05-17 MED FILL — CARVEDILOL 3.125 MG TABLET: 3.125 | 30 days supply | Qty: 60 | Fill #0

## 2018-05-18 ENCOUNTER — Other Ambulatory Visit: Payer: Self-pay | Admitting: Family Medicine

## 2018-05-18 MED ORDER — ACCU-CHEK GUIDE W/DEVICE KIT: 1.0000 | PACK | Freq: Two times a day (BID) | 0 refills | Status: DC

## 2018-05-21 NOTE — Assessment & Plan Note (Signed)
Encouraged DASH diet, decrease po intake and increase exercise as tolerated. Needs 7-8 hours of sleep nightly. Avoid trans fats, eat small, frequent meals every 4-5 hours with lean proteins, complex carbs and healthy fats. Minimize simple carbs, referred to [facility].

## 2018-05-22 DIAGNOSIS — Z23 Encounter for immunization: Secondary | ICD-10-CM | POA: Diagnosis not present

## 2018-05-22 DIAGNOSIS — E1169 Type 2 diabetes mellitus with other specified complication: Secondary | ICD-10-CM | POA: Diagnosis not present

## 2018-05-22 DIAGNOSIS — I10 Essential (primary) hypertension: Secondary | ICD-10-CM | POA: Diagnosis not present

## 2018-05-22 DIAGNOSIS — R109 Unspecified abdominal pain: Secondary | ICD-10-CM | POA: Diagnosis not present

## 2018-05-22 DIAGNOSIS — E6609 Other obesity due to excess calories: Secondary | ICD-10-CM | POA: Diagnosis not present

## 2018-05-22 DIAGNOSIS — E782 Mixed hyperlipidemia: Secondary | ICD-10-CM | POA: Diagnosis not present

## 2018-05-22 DIAGNOSIS — E669 Obesity, unspecified: Secondary | ICD-10-CM | POA: Diagnosis not present

## 2018-05-22 NOTE — Addendum Note (Signed)
Addended by: Magdalene Molly A on: 05/22/2018 06:29 PM   Modules accepted: Orders

## 2018-05-28 MED FILL — HYDROCHLOROTHIAZIDE 25 MG T: 25 | 90 days supply | Qty: 90 | Fill #0

## 2018-06-13 MED FILL — METFORMIN HCL ER 500 MG TAB: 500 | 30 days supply | Qty: 150 | Fill #3

## 2018-06-26 ENCOUNTER — Ambulatory Visit (HOSPITAL_BASED_OUTPATIENT_CLINIC_OR_DEPARTMENT_OTHER)
Admission: RE | Admit: 2018-06-26 | Discharge: 2018-06-26 | Disposition: A | Discharge status: Home / Self Care | Payer: 59 | Source: Ambulatory Visit | Attending: Internal Medicine | Admitting: Internal Medicine

## 2018-06-26 ENCOUNTER — Encounter: Payer: Self-pay | Admitting: Internal Medicine

## 2018-06-26 ENCOUNTER — Ambulatory Visit: Payer: 59 | Admitting: Internal Medicine

## 2018-06-26 ENCOUNTER — Telehealth: Payer: Self-pay

## 2018-06-26 VITALS — BP 130/80 | HR 94 | Temp 98.0°F | Resp 16 | Ht 64.0 in | Wt 234.4 lb

## 2018-06-26 DIAGNOSIS — R079 Chest pain, unspecified: Secondary | ICD-10-CM

## 2018-06-26 DIAGNOSIS — E669 Obesity, unspecified: Secondary | ICD-10-CM

## 2018-06-26 DIAGNOSIS — E1169 Type 2 diabetes mellitus with other specified complication: Secondary | ICD-10-CM

## 2018-06-26 MED ORDER — NITROGLYCERIN 0.4 MG SL SUBL: 0.4000 mg | SUBLINGUAL_TABLET | SUBLINGUAL | 1 refills | Status: AC | PRN

## 2018-06-26 MED FILL — NITROGLYCERIN 0.4 MG TAB SL: 0.4 | 25 days supply | Qty: 25 | Fill #0

## 2018-06-26 NOTE — Progress Notes (Signed)
Subjective:    Patient ID: Eileen Richards, female    DOB: July 05, 1961, 57 y.o.   MRN: [id number]  DOS:  06/26/2018 Type of visit - description : Acute visit Interval history: The patient is here today due to 3 recent episodes of chest pain. -The first 1 happened at church, when she was singing, ~ 2 weeks ago.  Described as squeezing feeling at the anterior mid chest, no radiation, no associated nausea or shortness of breath.  It lasted 20 to 30 minutes and went away without any intervention.. -Had a similar episode, yesterday. It lasted approximately 15 minutes and decrease after she took aspirin and Tums. -Today, had a third episode this time is described as a tingling feeling in the upper chest. No radiation. -Currently she is chest pain-free. -She did not try nitroglycerin for chest pain because she does not have any at home that is fresh.  Review of Systems Denies fever, chills. No cough Has a history of GERD, symptoms controlled.  Denies dysphasia or odynophagia. No shortness of breath or palpitations.   Past Medical History:  Diagnosis Date  . Achilles tendonitis 10/14/2012  . Asthma    childhood  . Atypical chest pain    normal stress echo 02/2009 -LV size was normal LV global systolic function was normal- Normal wall motion; no LV regional wall motion abnormalities  . Benign paroxysmal positional vertigo 04/26/2015  . Chest pain, atypical 08/24/2013  . Diabetes mellitus   . GERD (gastroesophageal reflux disease)   . Hand pain, left 11/10/2015  . Hyperlipemia   . Hypertension   . Neuropathic pain of ankle, left 04/27/2017  . Onychomycosis 04/17/2014  . Pain of right heel 10/14/2012  . Preventative health care 11/22/2015  . Sinusitis, acute 01/17/2016  . Thoracic outlet syndrome 11/03/2014  . Urinary urgency 12/15/2015    Past Surgical History:  Procedure Laterality Date  . ABDOMINAL HYSTERECTOMY    . CYSTOSCOPY  07/23/2012   Procedure: CYSTOSCOPY;  Surgeon: Marvene Staff, MD;  Location: [facility];  Service: Gynecology;  Laterality: N/A;  . DILATION AND CURETTAGE OF UTERUS     Lysis of Adhesions  . ROBOTIC ASSISTED LAPAROSCOPIC LYSIS OF ADHESION  07/23/2012   Procedure: ROBOTIC ASSISTED LAPAROSCOPIC LYSIS OF ADHESION;  Surgeon: Marvene Staff, MD;  Location: [facility];  Service: Gynecology;  Laterality: N/A;  . ROBOTIC ASSISTED SALPINGO OOPHERECTOMY  07/23/2012   Procedure: ROBOTIC ASSISTED SALPINGO OOPHERECTOMY;  Surgeon: Marvene Staff, MD;  Location: [facility];  Service: Gynecology;  Laterality: Bilateral;    Social History   Socioeconomic History  . Marital status: Single    Spouse name: Not on file  . Number of children: Not on file  . Years of education: Not on file  . Highest education level: Not on file  Occupational History  . Not on file  Social Needs  . Financial resource strain: Not on file  . Food insecurity:    Worry: Not on file    Inability: Not on file  . Transportation needs:    Medical: Not on file    Non-medical: Not on file  Tobacco Use  . Smoking status: Former Research scientist (life sciences)  . Smokeless tobacco: Never Used  Substance and Sexual Activity  . Alcohol use: No  . Drug use: No  . Sexual activity: Not on file  Lifestyle  . Physical activity:    Days per week: Not on file    Minutes per session: Not on file  . Stress:  Not on file  Relationships  . Social connections:    Talks on phone: Not on file    Gets together: Not on file    Attends religious service: Not on file    Active member of club or organization: Not on file    Attends meetings of clubs or organizations: Not on file    Relationship status: Not on file  . Intimate partner violence:    Fear of current or ex partner: Not on file    Emotionally abused: Not on file    Physically abused: Not on file    Forced sexual activity: Not on file  Other Topics Concern  . Not on file  Social History Narrative  . Not on file      Allergies as of 06/26/2018   No  Known Allergies     Medication List        Accurate as of 06/26/18 11:59 PM. Always use your most recent med list.          ACCU-CHEK FASTCLIX LANCETS Misc Use as directed twice daily to check blood sugar.  DX E11.9   ACCU-CHEK GUIDE w/Device Kit Inject 1 kit as directed 2 (two) times daily. Use to check blood sugar.  DX E11.9   albuterol 108 (90 Base) MCG/ACT inhaler Commonly known as:  PROVENTIL HFA;VENTOLIN HFA Inhale 2 puffs into the lungs every 6 (six) hours as needed for wheezing or shortness of breath.   aspirin 81 MG tablet Take 81 mg by mouth daily.   atorvastatin 20 MG tablet Commonly known as:  LIPITOR Take 1 tablet (20 mg total) by mouth daily.   BASAGLAR KWIKPEN 100 UNIT/ML Sopn Inject 0.1 mLs (10 Units total) into the skin daily.   carvedilol 3.125 MG tablet Commonly known as:  COREG Take 1 tablet (3.125 mg total) by mouth 2 (two) times daily with a meal.   dapagliflozin propanediol 5 MG Tabs tablet Commonly known as:  FARXIGA Take 5 mg by mouth daily.   glimepiride 4 MG tablet Commonly known as:  AMARYL Take 1 tablet (4 mg total) by mouth 2 (two) times daily.   glucose blood test strip Use as directed twice daily to check blood sugar.  DX E11.9   hydrochlorothiazide 25 MG tablet Commonly known as:  HYDRODIURIL Take 1 tablet (25 mg total) by mouth daily.   hyoscyamine 0.125 MG SL tablet Commonly known as:  LEVSIN SL Place 1 tablet (0.125 mg total) under the tongue every 4 (four) hours as needed.   Lancet Device Misc by Does not apply route. Use to test blood sugar three times daily   liraglutide 18 MG/3ML Sopn Commonly known as:  VICTOZA Inject 0.3 mLs (1.8 mg total) into the skin daily.   meclizine 25 MG tablet Commonly known as:  ANTIVERT Take 1 tablet (25 mg total) by mouth 3 (three) times daily as needed for dizziness.   metFORMIN 500 MG 24 hr tablet Commonly known as:  GLUCOPHAGE-XR TAKE 2 TABLETS BY MOUTH TWICE DAILY AND 1 TABLET  AT NOON   nitroGLYCERIN 0.4 MG SL tablet Commonly known as:  NITROSTAT Place 1 tablet (0.4 mg total) under the tongue every 5 (five) minutes x 3 doses as needed for chest pain.   pantoprazole 40 MG tablet Commonly known as:  PROTONIX Take 1 tablet (40 mg total) by mouth daily.   potassium chloride SA 20 MEQ tablet Commonly known as:  K-DUR,KLOR-CON Take 1 tablet (20 mEq total) by mouth daily.  promethazine 25 MG tablet Commonly known as:  PHENERGAN Take 1 tablet (25 mg total) by mouth every 8 (eight) hours as needed for nausea or vomiting.   sitaGLIPtin 100 MG tablet Commonly known as:  JANUVIA Take 1 tablet (100 mg total) by mouth daily.   tiZANidine 4 MG tablet Commonly known as:  ZANAFLEX Take 1 tablet (4 mg total) by mouth every 6 (six) hours as needed for muscle spasms.   traMADol 50 MG tablet Commonly known as:  ULTRAM TAKE 2 TABLETS BY MOUTH TWICE DAILY AS NEEDED          Objective:   Physical Exam BP 130/80 (BP Location: Left Arm, Patient Position: Sitting, Cuff Size: Normal)   Pulse 94   Temp 98 F (36.7 C) (Oral)   Resp 16   Ht '5\' 4"'$  (1.626 m)   Wt 234 lb 6 oz (106.3 kg)   SpO2 98%   BMI 40.23 kg/m  General:   Well developed, NAD, see BMI.  HEENT:  Normocephalic . Face symmetric, atraumatic Lungs:  CTA B Normal respiratory effort, no intercostal retractions, no accessory muscle use. Chest wall: Slightly TTP at the right costochondral area but the pain is not squeezing. Heart: RRR,  no murmur.  no pretibial edema bilaterally  Abdomen:  Not distended, soft, non-tender. No rebound or rigidity.   Skin: Not pale. Not jaundice Neurologic:  alert & oriented X3.  Speech normal, gait appropriate for age and unassisted Psych--  Cognition and judgment appear intact.  Cooperative with normal attention span and concentration.  Behavior appropriate. No anxious or depressed appearing.      Assessment & Plan:   57 year old female, history of PMH  includes HTN, asthma, former smoker, high cholesterol, DM, GERD, atypical chest pain, previous work-up including: Normal stress echo 02/2009. Low rist stress test 11-2016 for chest pain Presents with:  Recurrent chest pain: She is 57, multiple cardiovascular risk factors including uncontrolled diabetes.  EKG today is nonacute.  Recent labs show normal kidney function and no anemia.  Her pain has some typical and atypical features. Given multitude of risk factors, I will recommend to continue aspirin, work on DM control, get a chest x-ray for completeness, refill nitroglycerin, use it as needed, ER if symptoms increase, see cardiology again. I wonder if a cardiac catheterization is indicated to definitely rule out a cardiac etiology of pain.

## 2018-06-26 NOTE — Telephone Encounter (Signed)
Seen today. 

## 2018-06-26 NOTE — Telephone Encounter (Signed)
Author phoned pt. to f/u "chest concern" for which pt. Has an appointment with Dr. Larose Kells today. Pt. c/o intermittent chest tightness yesterday and today. Pt. States there is nothing that exacerbates or alleviates it. Pt. Denies SOB, N/V. Pt. states she has run out of nitroglycerin, and that the only medication change she has had recently is that she is no longer taking her basaglar. Author advised pt. To be seen in ED, but pt. stated she would rather wait to see Dr. Larose Kells, and understands that upon Dr. Ethel Rana assessment, may be triaged to ED anyway based upon VS and assessment findings. Pt. Verbalized understanding, and stated she would go to ED in the meantime if her CP worsened, she become SOB, or noticed swelling in her lower extremities. Dr. Larose Kells and Vilma Prader, CMA made aware of status.

## 2018-06-26 NOTE — Progress Notes (Signed)
Pre visit review using our clinic review tool, if applicable. No additional management support is needed unless otherwise documented below in the visit note. 

## 2018-06-26 NOTE — Patient Instructions (Signed)
Get a chest x-ray at the first floor  Continue with aspirin  Use nitroglycerin sublingually: 1 tablet every 5 minutes if chest pain. If the chest pain is severe or it does not improve after the second glycerin or you have associated nausea, shortness of breath: Call 911  We are referring you back to the cardiologist

## 2018-07-02 ENCOUNTER — Telehealth: Payer: Self-pay | Admitting: Family Medicine

## 2018-07-02 NOTE — Telephone Encounter (Signed)
Please let me know if Pt should be on this medication.

## 2018-07-02 NOTE — Telephone Encounter (Signed)
Copied from [place] [id number]. Topic: Quick Communication - Rx Refill/Question >> Jul 02, 2018 10:13 AM Gardiner Ramus wrote: Medication: Velta Addison calling from [facility] called and stated that pt needs a prior authorization on [place]. I did not see medication in medication list. Pharmacy call back number [phone]. Please advise

## 2018-07-03 NOTE — Telephone Encounter (Signed)
PA initiated via Covermymeds; KEY: [id number]. Awaiting determination.

## 2018-07-03 NOTE — Telephone Encounter (Signed)
Patient is currently taking the medication

## 2018-07-06 ENCOUNTER — Other Ambulatory Visit: Payer: Self-pay | Admitting: Family Medicine

## 2018-07-06 MED ORDER — INSULIN DETEMIR 100 UNIT/ML FLEXPEN: 10.0000 [IU] | PEN_INJECTOR | Freq: Every day | SUBCUTANEOUS | 3 refills | Status: DC

## 2018-07-06 MED FILL — CARVEDILOL 3.125 MG TABLET: 3.125 | 30 days supply | Qty: 60 | Fill #1

## 2018-07-06 MED FILL — POTASSIUM CL ER 20 MEQ TAB: 20 | 30 days supply | Qty: 30 | Fill #1

## 2018-07-06 MED FILL — JANUVIA 100 MG TABLET: 100 | 30 days supply | Qty: 30 | Fill #1

## 2018-07-06 MED FILL — LEVEMIR FLEXTOUCH 100 UNITS: 100 | 30 days supply | Qty: 3 | Fill #0

## 2018-07-06 MED FILL — FARXIGA 5 MG TABLET: 5 | 30 days supply | Qty: 30 | Fill #1

## 2018-07-06 MED FILL — GLIMEPIRIDE 4 MG TABLET: 4 | 30 days supply | Qty: 60 | Fill #1

## 2018-07-06 NOTE — Telephone Encounter (Signed)
PA denied. Preferred alternatives: Lantus, Lantus Solostar, Levemir, Levemir FlexTouch, Toujeo Solostar, or [place] FlexTouch U-100.

## 2018-07-06 NOTE — Telephone Encounter (Signed)
Rx sent 

## 2018-07-06 NOTE — Telephone Encounter (Signed)
Let's try Levemir Flextouch with same strength, same sig, same amount

## 2018-07-09 MED FILL — UNIFINE PENTIPS 31GX3/16": 31G X 5 MM | 90 days supply | Qty: 100 | Fill #0

## 2018-07-09 MED FILL — UNIFINE PENTIPS 31GX3/16: 31G X 5 MM | 90 days supply | Qty: 100 | Fill #0

## 2018-07-19 ENCOUNTER — Ambulatory Visit: Payer: 59 | Admitting: Family Medicine

## 2018-07-19 VITALS — BP 120/78 | HR 92 | Temp 97.6°F | Resp 18 | Wt 236.2 lb

## 2018-07-19 DIAGNOSIS — E6609 Other obesity due to excess calories: Secondary | ICD-10-CM | POA: Diagnosis not present

## 2018-07-19 DIAGNOSIS — E1169 Type 2 diabetes mellitus with other specified complication: Secondary | ICD-10-CM | POA: Diagnosis not present

## 2018-07-19 DIAGNOSIS — E669 Obesity, unspecified: Secondary | ICD-10-CM | POA: Diagnosis not present

## 2018-07-19 DIAGNOSIS — Z79899 Other long term (current) drug therapy: Secondary | ICD-10-CM

## 2018-07-19 DIAGNOSIS — R002 Palpitations: Secondary | ICD-10-CM

## 2018-07-19 DIAGNOSIS — E782 Mixed hyperlipidemia: Secondary | ICD-10-CM

## 2018-07-19 DIAGNOSIS — I10 Essential (primary) hypertension: Secondary | ICD-10-CM

## 2018-07-19 MED ORDER — INSULIN GLARGINE 100 UNIT/ML SOLOSTAR PEN: 10.0000 [IU] | PEN_INJECTOR | Freq: Every day | SUBCUTANEOUS | 3 refills | Status: DC

## 2018-07-19 MED ORDER — TRAMADOL HCL 50 MG PO TABS: ORAL_TABLET | ORAL | 0 refills | Status: DC

## 2018-07-19 MED FILL — traMADol HCL 50 MG TABS: 50 | 15 days supply | Qty: 60 | Fill #0

## 2018-07-19 MED FILL — LANTUS SOLOSTAR 100 UNITS/M: 100 | 30 days supply | Qty: 3 | Fill #0

## 2018-07-19 NOTE — Patient Instructions (Signed)
DASH Eating Plan DASH stands for "Dietary Approaches to Stop Hypertension." The DASH eating plan is a healthy eating plan that has been shown to reduce high blood pressure (hypertension). It may also reduce your risk for type 2 diabetes, heart disease, and stroke. The DASH eating plan may also help with weight loss. What are tips for following this plan? General guidelines  Avoid eating more than 2,300 mg (milligrams) of salt (sodium) a day. If you have hypertension, you may need to reduce your sodium intake to 1,500 mg a day.  Limit alcohol intake to no more than 1 drink a day for nonpregnant women and 2 drinks a day for men. One drink equals 12 oz of beer, 5 oz of wine, or 1 oz of hard liquor.  Work with your health care provider to maintain a healthy body weight or to lose weight. Ask what an ideal weight is for you.  Get at least 30 minutes of exercise that causes your heart to beat faster (aerobic exercise) most days of the week. Activities may include walking, swimming, or biking.  Work with your health care provider or diet and nutrition specialist (dietitian) to adjust your eating plan to your individual calorie needs. Reading food labels  Check food labels for the amount of sodium per serving. Choose foods with less than 5 percent of the Daily Value of sodium. Generally, foods with less than 300 mg of sodium per serving fit into this eating plan.  To find whole grains, look for the word "whole" as the first word in the ingredient list. Shopping  Buy products labeled as "low-sodium" or "no salt added."  Buy fresh foods. Avoid canned foods and premade or frozen meals. Cooking  Avoid adding salt when cooking. Use salt-free seasonings or herbs instead of table salt or sea salt. Check with your health care provider or pharmacist before using salt substitutes.  Do not fry foods. Cook foods using healthy methods such as baking, boiling, grilling, and broiling instead.  Cook with  heart-healthy oils, such as olive, canola, soybean, or sunflower oil. Meal planning   Eat a balanced diet that includes: ? 5 or more servings of fruits and vegetables each day. At each meal, try to fill half of your plate with fruits and vegetables. ? Up to 6-8 servings of whole grains each day. ? Less than 6 oz of lean meat, poultry, or fish each day. A 3-oz serving of meat is about the same size as a deck of cards. One egg equals 1 oz. ? 2 servings of low-fat dairy each day. ? A serving of nuts, seeds, or beans 5 times each week. ? Heart-healthy fats. Healthy fats called Omega-3 fatty acids are found in foods such as flaxseeds and coldwater fish, like sardines, salmon, and mackerel.  Limit how much you eat of the following: ? Canned or prepackaged foods. ? Food that is high in trans fat, such as fried foods. ? Food that is high in saturated fat, such as fatty meat. ? Sweets, desserts, sugary drinks, and other foods with added sugar. ? Full-fat dairy products.  Do not salt foods before eating.  Try to eat at least 2 vegetarian meals each week.  Eat more home-cooked food and less restaurant, buffet, and fast food.  When eating at a restaurant, ask that your food be prepared with less salt or no salt, if possible. What foods are recommended? The items listed may not be a complete list. Talk with your dietitian about what   dietary choices are best for you. Grains Whole-grain or whole-wheat bread. Whole-grain or whole-wheat pasta. Brown rice. Oatmeal. Quinoa. Bulgur. Whole-grain and low-sodium cereals. Pita bread. Low-fat, low-sodium crackers. Whole-wheat flour tortillas. Vegetables Fresh or frozen vegetables (raw, steamed, roasted, or grilled). Low-sodium or reduced-sodium tomato and vegetable juice. Low-sodium or reduced-sodium tomato sauce and tomato paste. Low-sodium or reduced-sodium canned vegetables. Fruits All fresh, dried, or frozen fruit. Canned fruit in natural juice (without  added sugar). Meat and other protein foods Skinless chicken or turkey. Ground chicken or turkey. Pork with fat trimmed off. Fish and seafood. Egg whites. Dried beans, peas, or lentils. Unsalted nuts, nut butters, and seeds. Unsalted canned beans. Lean cuts of beef with fat trimmed off. Low-sodium, lean deli meat. Dairy Low-fat (1%) or fat-free (skim) milk. Fat-free, low-fat, or reduced-fat cheeses. Nonfat, low-sodium ricotta or cottage cheese. Low-fat or nonfat yogurt. Low-fat, low-sodium cheese. Fats and oils Soft margarine without trans fats. Vegetable oil. Low-fat, reduced-fat, or light mayonnaise and salad dressings (reduced-sodium). Canola, safflower, olive, soybean, and sunflower oils. Avocado. Seasoning and other foods Herbs. Spices. Seasoning mixes without salt. Unsalted popcorn and pretzels. Fat-free sweets. What foods are not recommended? The items listed may not be a complete list. Talk with your dietitian about what dietary choices are best for you. Grains Baked goods made with fat, such as croissants, muffins, or some breads. Dry pasta or rice meal packs. Vegetables Creamed or fried vegetables. Vegetables in a cheese sauce. Regular canned vegetables (not low-sodium or reduced-sodium). Regular canned tomato sauce and paste (not low-sodium or reduced-sodium). Regular tomato and vegetable juice (not low-sodium or reduced-sodium). Pickles. Olives. Fruits Canned fruit in a light or heavy syrup. Fried fruit. Fruit in cream or butter sauce. Meat and other protein foods Fatty cuts of meat. Ribs. Fried meat. Bacon. Sausage. Bologna and other processed lunch meats. Salami. Fatback. Hotdogs. Bratwurst. Salted nuts and seeds. Canned beans with added salt. Canned or smoked fish. Whole eggs or egg yolks. Chicken or turkey with skin. Dairy Whole or 2% milk, cream, and half-and-half. Whole or full-fat cream cheese. Whole-fat or sweetened yogurt. Full-fat cheese. Nondairy creamers. Whipped toppings.  Processed cheese and cheese spreads. Fats and oils Butter. Stick margarine. Lard. Shortening. Ghee. Bacon fat. Tropical oils, such as coconut, palm kernel, or palm oil. Seasoning and other foods Salted popcorn and pretzels. Onion salt, garlic salt, seasoned salt, table salt, and sea salt. Worcestershire sauce. Tartar sauce. Barbecue sauce. Teriyaki sauce. Soy sauce, including reduced-sodium. Steak sauce. Canned and packaged gravies. Fish sauce. Oyster sauce. Cocktail sauce. Horseradish that you find on the shelf. Ketchup. Mustard. Meat flavorings and tenderizers. Bouillon cubes. Hot sauce and Tabasco sauce. Premade or packaged marinades. Premade or packaged taco seasonings. Relishes. Regular salad dressings. Where to find more information:  National Heart, Lung, and Blood Institute: www.nhlbi.nih.gov  American Heart Association: www.heart.org Summary  The DASH eating plan is a healthy eating plan that has been shown to reduce high blood pressure (hypertension). It may also reduce your risk for type 2 diabetes, heart disease, and stroke.  With the DASH eating plan, you should limit salt (sodium) intake to 2,300 mg a day. If you have hypertension, you may need to reduce your sodium intake to 1,500 mg a day.  When on the DASH eating plan, aim to eat more fresh fruits and vegetables, whole grains, lean proteins, low-fat dairy, and heart-healthy fats.  Work with your health care provider or diet and nutrition specialist (dietitian) to adjust your eating plan to your individual   calorie needs. This information is not intended to replace advice given to you by your health care provider. Make sure you discuss any questions you have with your health care provider. Document Released: 08/25/2011 Document Revised: 08/29/2016 Document Reviewed: 08/29/2016 Elsevier Interactive Patient Education  2018 [employer] Inc.  

## 2018-07-19 NOTE — Assessment & Plan Note (Signed)
Well controlled, no changes to meds. Encouraged heart healthy diet such as the DASH diet and exercise as tolerated.  °

## 2018-07-19 NOTE — Assessment & Plan Note (Signed)
hgba1c acceptable, minimize simple carbs. Increase exercise as tolerated.  

## 2018-07-19 NOTE — Assessment & Plan Note (Signed)
Is doing well but still has episodes from time to time. Has an appointment with cardiology

## 2018-07-19 NOTE — Assessment & Plan Note (Signed)
Encouraged DASH diet, decrease po intake and increase exercise as tolerated. Needs 7-8 hours of sleep nightly. Avoid trans fats, eat small, frequent meals every 4-5 hours with lean proteins, complex carbs and healthy fats. Minimize simple carb

## 2018-07-22 ENCOUNTER — Encounter: Payer: Self-pay | Admitting: Family Medicine

## 2018-07-22 NOTE — Assessment & Plan Note (Signed)
Encouraged heart healthy diet, increase exercise, avoid trans fats, consider a krill oil cap daily 

## 2018-07-22 NOTE — Progress Notes (Signed)
Subjective:    Patient ID: Eileen Richards, female    DOB: 09-14-1961, 57 y.o.   MRN: [id number]  No chief complaint on file.   HPI Patient is in today for follow up and she is [place] great deal of stress with her mother hospitalized after a surgery. Otherwise her physical health is stable. No polyuria or polydipsia. No recent febrile illness or hospitalizations herself. Denies CP/palp/SOB/HA/congestion/fevers/GI or GU c/o. Taking meds as prescribed. Sugars have been labile with the stress.   Past Medical History:  Diagnosis Date  . Achilles tendonitis 10/14/2012  . Asthma    childhood  . Atypical chest pain    normal stress echo 02/2009 -LV size was normal LV global systolic function was normal- Normal wall motion; no LV regional wall motion abnormalities  . Benign paroxysmal positional vertigo 04/26/2015  . Chest pain, atypical 08/24/2013  . Diabetes mellitus   . GERD (gastroesophageal reflux disease)   . Hand pain, left 11/10/2015  . Hyperlipemia   . Hypertension   . Neuropathic pain of ankle, left 04/27/2017  . Onychomycosis 04/17/2014  . Pain of right heel 10/14/2012  . Preventative health care 11/22/2015  . Sinusitis, acute 01/17/2016  . Thoracic outlet syndrome 11/03/2014  . Urinary urgency 12/15/2015    Past Surgical History:  Procedure Laterality Date  . ABDOMINAL HYSTERECTOMY    . CYSTOSCOPY  07/23/2012   Procedure: CYSTOSCOPY;  Surgeon: Marvene Staff, MD;  Location: [facility];  Service: Gynecology;  Laterality: N/A;  . DILATION AND CURETTAGE OF UTERUS     Lysis of Adhesions  . ROBOTIC ASSISTED LAPAROSCOPIC LYSIS OF ADHESION  07/23/2012   Procedure: ROBOTIC ASSISTED LAPAROSCOPIC LYSIS OF ADHESION;  Surgeon: Marvene Staff, MD;  Location: [facility];  Service: Gynecology;  Laterality: N/A;  . ROBOTIC ASSISTED SALPINGO OOPHERECTOMY  07/23/2012   Procedure: ROBOTIC ASSISTED SALPINGO OOPHERECTOMY;  Surgeon: Marvene Staff, MD;  Location: [facility];  Service: Gynecology;   Laterality: Bilateral;    Family History  Problem Relation Age of Onset  . GER disease Mother   . GER disease Father   . Heart disease Father        father has "heart problems"  . Anemia Father     Social History   Socioeconomic History  . Marital status: Single    Spouse name: Not on file  . Number of children: Not on file  . Years of education: Not on file  . Highest education level: Not on file  Occupational History  . Not on file  Social Needs  . Financial resource strain: Not on file  . Food insecurity:    Worry: Not on file    Inability: Not on file  . Transportation needs:    Medical: Not on file    Non-medical: Not on file  Tobacco Use  . Smoking status: Former Research scientist (life sciences)  . Smokeless tobacco: Never Used  Substance and Sexual Activity  . Alcohol use: No  . Drug use: No  . Sexual activity: Not on file  Lifestyle  . Physical activity:    Days per week: Not on file    Minutes per session: Not on file  . Stress: Not on file  Relationships  . Social connections:    Talks on phone: Not on file    Gets together: Not on file    Attends religious service: Not on file    Active member of club or organization: Not on file    Attends meetings of  clubs or organizations: Not on file    Relationship status: Not on file  . Intimate partner violence:    Fear of current or ex partner: Not on file    Emotionally abused: Not on file    Physically abused: Not on file    Forced sexual activity: Not on file  Other Topics Concern  . Not on file  Social History Narrative  . Not on file    Outpatient Medications Prior to Visit  Medication Sig Dispense Refill  . ACCU-CHEK FASTCLIX LANCETS MISC Use as directed twice daily to check blood sugar.  DX E11.9 102 each 6  . albuterol (PROVENTIL HFA;VENTOLIN HFA) 108 (90 Base) MCG/ACT inhaler Inhale 2 puffs into the lungs every 6 (six) hours as needed for wheezing or shortness of breath. 1 Inhaler 6  . aspirin 81 MG tablet Take 81 mg  by mouth daily.    Marland Kitchen atorvastatin (LIPITOR) 20 MG tablet Take 1 tablet (20 mg total) by mouth daily. 90 tablet 3  . Blood Glucose Monitoring Suppl (ACCU-CHEK GUIDE) w/Device KIT Inject 1 kit as directed 2 (two) times daily. Use to check blood sugar.  DX E11.9 1 kit 0  . carvedilol (COREG) 3.125 MG tablet Take 1 tablet (3.125 mg total) by mouth 2 (two) times daily with a meal. 60 tablet 11  . dapagliflozin propanediol (FARXIGA) 5 MG TABS tablet Take 5 mg by mouth daily. 30 tablet 3  . glimepiride (AMARYL) 4 MG tablet Take 1 tablet (4 mg total) by mouth 2 (two) times daily. 60 tablet 6  . glucose blood (ACCU-CHEK GUIDE) test strip Use as directed twice daily to check blood sugar.  DX E11.9 100 each 6  . hydrochlorothiazide (HYDRODIURIL) 25 MG tablet Take 1 tablet (25 mg total) by mouth daily. 90 tablet 5  . hyoscyamine (LEVSIN SL) 0.125 MG SL tablet Place 1 tablet (0.125 mg total) under the tongue every 4 (four) hours as needed. 30 tablet 0  . Lancet Device MISC by Does not apply route. Use to test blood sugar three times daily     . liraglutide (VICTOZA) 18 MG/3ML SOPN Inject 0.3 mLs (1.8 mg total) into the skin daily. 6 mL 6  . meclizine (ANTIVERT) 25 MG tablet Take 1 tablet (25 mg total) by mouth 3 (three) times daily as needed for dizziness. 30 tablet 0  . metFORMIN (GLUCOPHAGE-XR) 500 MG 24 hr tablet TAKE 2 TABLETS BY MOUTH TWICE DAILY AND 1 TABLET AT NOON 150 tablet 5  . nitroGLYCERIN (NITROSTAT) 0.4 MG SL tablet Place 1 tablet (0.4 mg total) under the tongue every 5 (five) minutes x 3 doses as needed for chest pain. 40 tablet 1  . pantoprazole (PROTONIX) 40 MG tablet Take 1 tablet (40 mg total) by mouth daily. 30 tablet 5  . potassium chloride SA (K-DUR,KLOR-CON) 20 MEQ tablet Take 1 tablet (20 mEq total) by mouth daily. 30 tablet 3  . promethazine (PHENERGAN) 25 MG tablet Take 1 tablet (25 mg total) by mouth every 8 (eight) hours as needed for nausea or vomiting. 20 tablet 0  . sitaGLIPtin  (JANUVIA) 100 MG tablet Take 1 tablet (100 mg total) by mouth daily. 30 tablet 6  . tiZANidine (ZANAFLEX) 4 MG tablet Take 1 tablet (4 mg total) by mouth every 6 (six) hours as needed for muscle spasms. 40 tablet 1  . UNIFINE PENTIPS 31G X 5 MM MISC USE AS DIRECTED 100 each 2  . Insulin Detemir (LEVEMIR) 100 UNIT/ML Pen Inject  10 Units into the skin daily. 15 mL 3  . traMADol (ULTRAM) 50 MG tablet TAKE 2 TABLETS BY MOUTH TWICE DAILY AS NEEDED 60 tablet 0   No facility-administered medications prior to visit.     No Known Allergies  Review of Systems  Constitutional: Positive for malaise/fatigue. Negative for fever.  HENT: Negative for congestion.   Eyes: Negative for blurred vision.  Respiratory: Negative for shortness of breath.   Cardiovascular: Negative for chest pain, palpitations and leg swelling.  Gastrointestinal: Negative for abdominal pain, blood in stool and nausea.  Genitourinary: Negative for dysuria and frequency.  Musculoskeletal: Negative for falls.  Skin: Negative for rash.  Neurological: Negative for dizziness, loss of consciousness and headaches.  Endo/Heme/Allergies: Negative for environmental allergies.  Psychiatric/Behavioral: Negative for depression. The patient is nervous/anxious.        Objective:    Physical Exam  Constitutional: She is oriented to person, place, and time. She appears well-developed and well-nourished. No distress.  HENT:  Head: Normocephalic and atraumatic.  Nose: Nose normal.  Eyes: Right eye exhibits no discharge. Left eye exhibits no discharge.  Neck: Normal range of motion. Neck supple.  Cardiovascular: Normal rate and regular rhythm.  No murmur heard. Pulmonary/Chest: Effort normal and breath sounds normal.  Abdominal: Soft. Bowel sounds are normal. There is no tenderness.  Musculoskeletal: She exhibits no edema.  Neurological: She is alert and oriented to person, place, and time.  Skin: Skin is warm and dry.  Psychiatric: She  has a normal mood and affect.  Nursing note and vitals reviewed.   BP 120/78 (BP Location: Left Arm, Patient Position: Sitting, Cuff Size: Normal)   Pulse 92   Temp 97.6 F (36.4 C) (Oral)   Resp 18   Wt 236 lb 3.2 oz (107.1 kg)   SpO2 98%   BMI 40.54 kg/m  Wt Readings from Last 3 Encounters:  07/19/18 236 lb 3.2 oz (107.1 kg)  06/26/18 234 lb 6 oz (106.3 kg)  05/15/18 239 lb (108.4 kg)     Lab Results  Component Value Date   WBC 11.2 (H) 05/15/2018   HGB 14.6 05/15/2018   HCT 44.4 05/15/2018   PLT 414.0 (H) 05/15/2018   GLUCOSE 256 (H) 05/15/2018   CHOL 163 05/15/2018   TRIG 150.0 (H) 05/15/2018   HDL 40.10 05/15/2018   LDLDIRECT 100.0 08/03/2017   LDLCALC 93 05/15/2018   ALT 21 05/15/2018   AST 18 05/15/2018   NA 140 05/15/2018   K 3.9 05/15/2018   CL 98 05/15/2018   CREATININE 0.77 05/15/2018   BUN 11 05/15/2018   CO2 33 (H) 05/15/2018   TSH 1.16 05/15/2018   HGBA1C 9.9 (H) 05/15/2018   MICROALBUR 0.9 11/12/2015    Lab Results  Component Value Date   TSH 1.16 05/15/2018   Lab Results  Component Value Date   WBC 11.2 (H) 05/15/2018   HGB 14.6 05/15/2018   HCT 44.4 05/15/2018   MCV 91.8 05/15/2018   PLT 414.0 (H) 05/15/2018   Lab Results  Component Value Date   NA 140 05/15/2018   K 3.9 05/15/2018   CO2 33 (H) 05/15/2018   GLUCOSE 256 (H) 05/15/2018   BUN 11 05/15/2018   CREATININE 0.77 05/15/2018   BILITOT 0.4 05/15/2018   ALKPHOS 98 05/15/2018   AST 18 05/15/2018   ALT 21 05/15/2018   PROT 7.0 05/15/2018   ALBUMIN 4.0 05/15/2018   CALCIUM 9.8 05/15/2018   GFR 99.89 05/15/2018   Lab Results  Component Value Date   CHOL 163 05/15/2018   Lab Results  Component Value Date   HDL 40.10 05/15/2018   Lab Results  Component Value Date   LDLCALC 93 05/15/2018   Lab Results  Component Value Date   TRIG 150.0 (H) 05/15/2018   Lab Results  Component Value Date   CHOLHDL 4 05/15/2018   Lab Results  Component Value Date   HGBA1C 9.9  (H) 05/15/2018       Assessment & Plan:   Problem List Items Addressed This Visit    Diabetes mellitus type 2 in obese ([place])    hgba1c acceptable, minimize simple carbs. Increase exercise as tolerated.       Relevant Medications   Insulin Glargine (LANTUS SOLOSTAR) 100 UNIT/ML Solostar Pen   Hyperlipidemia, mixed    Encouraged heart healthy diet, increase exercise, avoid trans fats, consider a krill oil cap daily      Obesity    Encouraged DASH diet, decrease po intake and increase exercise as tolerated. Needs 7-8 hours of sleep nightly. Avoid trans fats, eat small, frequent meals every 4-5 hours with lean proteins, complex carbs and healthy fats. Minimize simple carb      Relevant Medications   Insulin Glargine (LANTUS SOLOSTAR) 100 UNIT/ML Solostar Pen   Essential hypertension    Well controlled, no changes to meds. Encouraged heart healthy diet such as the DASH diet and exercise as tolerated.       Palpitations    Is doing well but still has episodes from time to time. Has an appointment with cardiology       Other Visit Diagnoses    High risk medication use    -  Primary      I have discontinued Makinzie Bocanegra's Insulin Detemir. I am also having her start on Insulin Glargine. Additionally, I am having her maintain her Lancet Device, aspirin, meclizine, promethazine, metFORMIN, tiZANidine, glucose blood, ACCU-CHEK FASTCLIX LANCETS, hyoscyamine, carvedilol, sitaGLIPtin, atorvastatin, liraglutide, potassium chloride SA, pantoprazole, dapagliflozin propanediol, glimepiride, albuterol, hydrochlorothiazide, ACCU-CHEK GUIDE, nitroGLYCERIN, UNIFINE PENTIPS, and traMADol.  Meds ordered this encounter  Medications  . traMADol (ULTRAM) 50 MG tablet    Sig: TAKE 2 TABLETS BY MOUTH TWICE DAILY AS NEEDED    Dispense:  60 tablet    Refill:  0  . Insulin Glargine (LANTUS SOLOSTAR) 100 UNIT/ML Solostar Pen    Sig: Inject 10 Units into the skin daily.    Dispense:  5 pen     Refill:  3     Penni Homans, MD

## 2018-07-27 MED FILL — PANTOPRAZOLE SOD DR 40 MG T: 40 | 30 days supply | Qty: 30 | Fill #1

## 2018-07-27 MED FILL — ACCU-CHEK GUIDE STRP: 50 days supply | Qty: 100 | Fill #1

## 2018-07-27 MED FILL — metFORMIN HCL ER 500 MG TB2: 500 | 30 days supply | Qty: 150 | Fill #4

## 2018-07-30 ENCOUNTER — Encounter: Payer: Self-pay | Admitting: Cardiovascular Disease

## 2018-07-30 MED FILL — JANUVIA 100 MG TABLET: 100 | 30 days supply | Qty: 30 | Fill #2

## 2018-08-10 ENCOUNTER — Telehealth: Payer: Self-pay

## 2018-08-10 NOTE — Telephone Encounter (Signed)
PA initiated via Covermymeds; KEY: [id number]. Awaiting determination.

## 2018-08-13 MED FILL — VICTOZA 2-PAK 18 MG/3 ML PE: 18 | 20 days supply | Qty: 6 | Fill #1

## 2018-08-13 NOTE — Progress Notes (Signed)
Cardiology Office Note   Date:  08/13/2018   ID:  Eileen Richards, DOB 05/10/1961, MRN [id number]  PCP:  Mosie Lukes, MD  Cardiologist:   Jenkins Rouge, MD   No chief complaint on file.     History of Present Illness: Eileen Richards is a 57 y.o. female who presents for consultation regarding chest pain Referred by Dr Randel Pigg  Reviewed her office note from 07/19/18  She had atypical pain in 2010 with normal stress echo. CRF;s include DM, HTN And HLD  Also complains of palpitations She is on a beta blocker DM poorly controlled with A1c 9.9 in August She had A low risk myovue 12/02/16 no ischemia EF 62%  Pain atypical tingling sporadic pattern not related to exertion. Previous Smoker with mild exertional dyspnea.   Last month doing well no real chest pain Her pains are very atypical fleeting sharp and no relationship to exertion She has worked at [employer] in environmental services for 27 years. Lives alone with mother Has brothers and sisters In town   Long discussion about how poorly her BS is controlled and the long term consequences    Past Medical History:  Diagnosis Date  . Achilles tendonitis 10/14/2012  . Asthma    childhood  . Atypical chest pain    normal stress echo 02/2009 -LV size was normal LV global systolic function was normal- Normal wall motion; no LV regional wall motion abnormalities  . Benign paroxysmal positional vertigo 04/26/2015  . Chest pain, atypical 08/24/2013  . Diabetes mellitus   . GERD (gastroesophageal reflux disease)   . Hand pain, left 11/10/2015  . Hyperlipemia   . Hypertension   . Neuropathic pain of ankle, left 04/27/2017  . Onychomycosis 04/17/2014  . Pain of right heel 10/14/2012  . Preventative health care 11/22/2015  . Sinusitis, acute 01/17/2016  . Thoracic outlet syndrome 11/03/2014  . Urinary urgency 12/15/2015    Past Surgical History:  Procedure Laterality Date  . ABDOMINAL HYSTERECTOMY    . CYSTOSCOPY  07/23/2012   Procedure:  CYSTOSCOPY;  Surgeon: Marvene Staff, MD;  Location: [facility];  Service: Gynecology;  Laterality: N/A;  . DILATION AND CURETTAGE OF UTERUS     Lysis of Adhesions  . ROBOTIC ASSISTED LAPAROSCOPIC LYSIS OF ADHESION  07/23/2012   Procedure: ROBOTIC ASSISTED LAPAROSCOPIC LYSIS OF ADHESION;  Surgeon: Marvene Staff, MD;  Location: [facility];  Service: Gynecology;  Laterality: N/A;  . ROBOTIC ASSISTED SALPINGO OOPHERECTOMY  07/23/2012   Procedure: ROBOTIC ASSISTED SALPINGO OOPHERECTOMY;  Surgeon: Marvene Staff, MD;  Location: [facility];  Service: Gynecology;  Laterality: Bilateral;     Current Outpatient Medications  Medication Sig Dispense Refill  . ACCU-CHEK FASTCLIX LANCETS MISC Use as directed twice daily to check blood sugar.  DX E11.9 102 each 6  . albuterol (PROVENTIL HFA;VENTOLIN HFA) 108 (90 Base) MCG/ACT inhaler Inhale 2 puffs into the lungs every 6 (six) hours as needed for wheezing or shortness of breath. 1 Inhaler 6  . aspirin 81 MG tablet Take 81 mg by mouth daily.    Marland Kitchen atorvastatin (LIPITOR) 20 MG tablet Take 1 tablet (20 mg total) by mouth daily. 90 tablet 3  . Blood Glucose Monitoring Suppl (ACCU-CHEK GUIDE) w/Device KIT Inject 1 kit as directed 2 (two) times daily. Use to check blood sugar.  DX E11.9 1 kit 0  . carvedilol (COREG) 3.125 MG tablet Take 1 tablet (3.125 mg total) by mouth 2 (two) times daily with a meal.  60 tablet 11  . glimepiride (AMARYL) 4 MG tablet Take 1 tablet (4 mg total) by mouth 2 (two) times daily. 60 tablet 6  . glucose blood (ACCU-CHEK GUIDE) test strip Use as directed twice daily to check blood sugar.  DX E11.9 100 each 6  . hydrochlorothiazide (HYDRODIURIL) 25 MG tablet Take 1 tablet (25 mg total) by mouth daily. 90 tablet 5  . hyoscyamine (LEVSIN SL) 0.125 MG SL tablet Place 1 tablet (0.125 mg total) under the tongue every 4 (four) hours as needed. 30 tablet 0  . Insulin Glargine (LANTUS SOLOSTAR) 100 UNIT/ML Solostar Pen Inject 10 Units into the  skin daily. 5 pen 3  . Lancet Device MISC by Does not apply route. Use to test blood sugar three times daily     . liraglutide (VICTOZA) 18 MG/3ML SOPN Inject 0.3 mLs (1.8 mg total) into the skin daily. 6 mL 6  . meclizine (ANTIVERT) 25 MG tablet Take 1 tablet (25 mg total) by mouth 3 (three) times daily as needed for dizziness. 30 tablet 0  . metFORMIN (GLUCOPHAGE-XR) 500 MG 24 hr tablet TAKE 2 TABLETS BY MOUTH TWICE DAILY AND 1 TABLET AT NOON 150 tablet 5  . nitroGLYCERIN (NITROSTAT) 0.4 MG SL tablet Place 1 tablet (0.4 mg total) under the tongue every 5 (five) minutes x 3 doses as needed for chest pain. 40 tablet 1  . pantoprazole (PROTONIX) 40 MG tablet Take 1 tablet (40 mg total) by mouth daily. 30 tablet 5  . potassium chloride SA (K-DUR,KLOR-CON) 20 MEQ tablet Take 1 tablet (20 mEq total) by mouth daily. 30 tablet 3  . promethazine (PHENERGAN) 25 MG tablet Take 1 tablet (25 mg total) by mouth every 8 (eight) hours as needed for nausea or vomiting. 20 tablet 0  . sitaGLIPtin (JANUVIA) 100 MG tablet Take 1 tablet (100 mg total) by mouth daily. 30 tablet 6  . tiZANidine (ZANAFLEX) 4 MG tablet Take 1 tablet (4 mg total) by mouth every 6 (six) hours as needed for muscle spasms. 40 tablet 1  . traMADol (ULTRAM) 50 MG tablet TAKE 2 TABLETS BY MOUTH TWICE DAILY AS NEEDED 60 tablet 0  . UNIFINE PENTIPS 31G X 5 MM MISC USE AS DIRECTED 100 each 2   No current facility-administered medications for this visit.     Allergies:   Farxiga [dapagliflozin]    Social History:  The patient  reports that she has quit smoking. She has never used smokeless tobacco. She reports that she does not drink alcohol or use drugs.   Family History:  The patient's family history includes Anemia in her father; GER disease in her father and mother; Heart disease in her father.    ROS:  Please see the history of present illness.   Otherwise, review of systems are positive for none.   All other systems are reviewed and  negative.    PHYSICAL EXAM: VS:  There were no vitals taken for this visit. , BMI There is no height or weight on file to calculate BMI. Affect appropriate Obese female  HEENT: normal Neck supple with no adenopathy JVP normal no bruits no thyromegaly Lungs clear with no wheezing and good diaphragmatic motion Heart:  S1/S2 no murmur, no rub, gallop or click PMI normal Abdomen: benighn, BS positve, no tenderness, no AAA no bruit.  No HSM or HJR Distal pulses intact with no bruits No edema Neuro non-focal Skin warm and dry No muscular weakness    EKG:  06/26/18  SR rate 95 low voltage due to body habitus    Recent Labs: 05/15/2018: ALT 21; BUN 11; Creatinine, Ser 0.77; Hemoglobin 14.6; Platelets 414.0; Potassium 3.9; Sodium 140; TSH 1.16    Lipid Panel    Component Value Date/Time   CHOL 163 05/15/2018 1646   TRIG 150.0 (H) 05/15/2018 1646   HDL 40.10 05/15/2018 1646   CHOLHDL 4 05/15/2018 1646   VLDL 30.0 05/15/2018 1646   LDLCALC 93 05/15/2018 1646   LDLDIRECT 100.0 08/03/2017 1652      Wt Readings from Last 3 Encounters:  07/19/18 236 lb 3.2 oz (107.1 kg)  06/26/18 234 lb 6 oz (106.3 kg)  05/15/18 239 lb (108.4 kg)      Other studies Reviewed: Additional studies/ records that were reviewed today include: notes from primary labs ECG myovue 2018.    ASSESSMENT AND PLAN:  1.  Chest Pain: atypical normal myovue 2018 observe 2.  Palpitations benign no need for monitor  3. DM Discussed low carb diet.  Target hemoglobin A1c is 6.5 or less.  Continue current medications. 4.  HTN: Well controlled.  Continue current medications and low sodium Dash type diet.   5.  HLD  Continue station labs with primary    Current medicines are reviewed at length with the patient today.  The patient does not have concerns regarding medicines.  The following changes have been made:  no change  Labs/ tests ordered today include: None  No orders of the defined types were placed  in this encounter.    Disposition:   FU with cardiology PRN      Signed, Jenkins Rouge, MD  08/13/2018 12:30 PM    [address], [place], [place]  [id number] Phone: [phone]; Fax: [phone]

## 2018-08-14 NOTE — Telephone Encounter (Signed)
PA approved.   The request has been approved. The authorization is effective for a maximum of 12 fills from 08/23/2018 to 08/23/2019, as long as the member is enrolled in their current health plan. The request was reviewed and approved by a licensed clinical pharmacist. A written notification letter will follow with additional details.

## 2018-08-20 ENCOUNTER — Ambulatory Visit: Payer: 59 | Admitting: Cardiovascular Disease

## 2018-08-20 ENCOUNTER — Encounter: Payer: Self-pay | Admitting: Cardiovascular Disease

## 2018-08-20 VITALS — BP 134/86 | HR 87 | Ht 64.0 in | Wt 241.2 lb

## 2018-08-20 DIAGNOSIS — E785 Hyperlipidemia, unspecified: Secondary | ICD-10-CM

## 2018-08-20 DIAGNOSIS — I10 Essential (primary) hypertension: Secondary | ICD-10-CM

## 2018-08-20 NOTE — Patient Instructions (Signed)
Medication Instructions:   If you need a refill on your cardiac medications before your next appointment, please call your pharmacy.   Lab work:  If you have labs (blood work) drawn today and your tests are completely normal, you will receive your results only by: Marland Kitchen MyChart Message (if you have MyChart) OR . A paper copy in the mail If you have any lab test that is abnormal or we need to change your treatment, we will call you to review the results.  Testing/Procedures: NONE ordered today.  Follow-Up: At [facility], you and your health needs are our priority.  As part of our continuing mission to provide you with exceptional heart care, we have created designated Provider Care Teams.  These Care Teams include your primary Cardiologist (physician) and Advanced Practice Providers (APPs -  Physician Assistants and Nurse Practitioners) who all work together to provide you with the care you need, when you need it. Your physician recommends that you schedule a follow-up appointment as needed with Dr. Johnsie Cancel.

## 2018-08-27 ENCOUNTER — Encounter: Payer: Self-pay | Admitting: Family Medicine

## 2018-08-29 MED FILL — LANTUS SOLOSTAR 100 UNITS/M: 100 | 30 days supply | Qty: 3 | Fill #1

## 2018-08-31 MED FILL — metFORMIN HCL ER 500 MG TB2: 500 | 30 days supply | Qty: 150 | Fill #5

## 2018-08-31 MED FILL — POTASSIUM CHLORIDE CRYS ER: 20 | 30 days supply | Qty: 30 | Fill #2

## 2018-08-31 MED FILL — CARVEDILOL 3.125 MG TABLET: 3.125 | 30 days supply | Qty: 60 | Fill #2

## 2018-08-31 MED FILL — GLIMEPIRIDE 4 MG TABLET: 4 | 30 days supply | Qty: 60 | Fill #2

## 2018-08-31 MED FILL — PANTOPRAZOLE SOD DR 40 MG T: 40 | 30 days supply | Qty: 30 | Fill #2

## 2018-08-31 MED FILL — JANUVIA 100 MG TABLET: 100 | 30 days supply | Qty: 30 | Fill #3

## 2018-08-31 MED FILL — HYDROCHLOROTHIAZIDE 25 MG T: 25 | 90 days supply | Qty: 90 | Fill #1

## 2018-09-05 MED FILL — VICTOZA 2-PAK 18 MG/3 ML PE: 18 | 20 days supply | Qty: 6 | Fill #2

## 2018-09-26 MED FILL — VICTOZA 2-PAK 18 MG/3 ML PE: 18 | 20 days supply | Qty: 6 | Fill #3

## 2018-09-27 ENCOUNTER — Ambulatory Visit: Payer: 59 | Admitting: Family Medicine

## 2018-09-27 VITALS — BP 128/82 | HR 77 | Temp 98.1°F | Resp 18 | Wt 235.4 lb

## 2018-09-27 DIAGNOSIS — E6609 Other obesity due to excess calories: Secondary | ICD-10-CM | POA: Diagnosis not present

## 2018-09-27 DIAGNOSIS — E669 Obesity, unspecified: Secondary | ICD-10-CM | POA: Diagnosis not present

## 2018-09-27 DIAGNOSIS — E1169 Type 2 diabetes mellitus with other specified complication: Secondary | ICD-10-CM

## 2018-09-27 DIAGNOSIS — Z23 Encounter for immunization: Secondary | ICD-10-CM | POA: Diagnosis not present

## 2018-09-27 DIAGNOSIS — R3911 Hesitancy of micturition: Secondary | ICD-10-CM | POA: Diagnosis not present

## 2018-09-27 DIAGNOSIS — I10 Essential (primary) hypertension: Secondary | ICD-10-CM

## 2018-09-27 DIAGNOSIS — E782 Mixed hyperlipidemia: Secondary | ICD-10-CM

## 2018-09-27 MED ORDER — CETIRIZINE HCL 10 MG PO TABS: 10.0000 mg | ORAL_TABLET | Freq: Two times a day (BID) | ORAL | 3 refills | Status: DC

## 2018-09-27 MED ORDER — FAMOTIDINE 20 MG PO TABS: 20.0000 mg | ORAL_TABLET | Freq: Two times a day (BID) | ORAL | 2 refills | Status: DC

## 2018-09-27 MED FILL — FAMOTIDINE 20 MG TABLET: 20 | 8 days supply | Qty: 17 | Fill #0

## 2018-09-27 NOTE — Assessment & Plan Note (Signed)
Well controlled, no changes to meds. Encouraged heart healthy diet such as the DASH diet and exercise as tolerated.  °

## 2018-09-27 NOTE — Progress Notes (Signed)
.  h 

## 2018-09-27 NOTE — Patient Instructions (Addendum)
Increase Lantus: minimize simple carbs. Increase exercise as tolerated. Continue Victoza, glimeperide and increase Lantus to 12 units then up by 2 units every 3 days until numbers start to approach 100 then stop increasing. Eat small frequent meals, with lean proteins, healthy fats. Referred to nutritionist for evaluation Carbohydrate Counting for Diabetes Mellitus, Adult  Carbohydrate counting is a method of keeping track of how many carbohydrates you eat. Eating carbohydrates naturally increases the amount of sugar (glucose) in the blood. Counting how many carbohydrates you eat helps keep your blood glucose within normal limits, which helps you manage your diabetes (diabetes mellitus). It is important to know how many carbohydrates you can safely have in each meal. This is different for every person. A diet and nutrition specialist (registered dietitian) can help you make a meal plan and calculate how many carbohydrates you should have at each meal and snack. Carbohydrates are found in the following foods:  Grains, such as breads and cereals.  Dried beans and soy products.  Starchy vegetables, such as potatoes, peas, and corn.  Fruit and fruit juices.  Milk and yogurt.  Sweets and snack foods, such as cake, cookies, candy, chips, and soft drinks. How do I count carbohydrates? There are two ways to count carbohydrates in food. You can use either of the methods or a combination of both. Reading "Nutrition Facts" on packaged food The "Nutrition Facts" list is included on the labels of almost all packaged foods and beverages in the [place] It includes:  The serving size.  Information about nutrients in each serving, including the grams (g) of carbohydrate per serving. To use the "Nutrition Facts":  Decide how many servings you will have.  Multiply the number of servings by the number of carbohydrates per serving.  The resulting number is the total amount of carbohydrates that you will be  having. Learning standard serving sizes of other foods When you eat carbohydrate foods that are not packaged or do not include "Nutrition Facts" on the label, you need to measure the servings in order to count the amount of carbohydrates:  Measure the foods that you will eat with a food scale or measuring cup, if needed.  Decide how many standard-size servings you will eat.  Multiply the number of servings by 15. Most carbohydrate-rich foods have about 15 g of carbohydrates per serving. ? For example, if you eat 8 oz (170 g) of strawberries, you will have eaten 2 servings and 30 g of carbohydrates (2 servings x 15 g = 30 g).  For foods that have more than one food mixed, such as soups and casseroles, you must count the carbohydrates in each food that is included. The following list contains standard serving sizes of common carbohydrate-rich foods. Each of these servings has about 15 g of carbohydrates:   hamburger bun or  English muffin.   oz (15 mL) syrup.   oz (14 g) jelly.  1 slice of bread.  1 six-inch tortilla.  3 oz (85 g) cooked rice or pasta.  4 oz (113 g) cooked dried beans.  4 oz (113 g) starchy vegetable, such as peas, corn, or potatoes.  4 oz (113 g) hot cereal.  4 oz (113 g) mashed potatoes or  of a large baked potato.  4 oz (113 g) canned or frozen fruit.  4 oz (120 mL) fruit juice.  4-6 crackers.  6 chicken nuggets.  6 oz (170 g) unsweetened dry cereal.  6 oz (170 g) plain fat-free yogurt or  yogurt sweetened with artificial sweeteners.  8 oz (240 mL) milk.  8 oz (170 g) fresh fruit or one small piece of fruit.  24 oz (680 g) popped popcorn. Example of carbohydrate counting Sample meal  3 oz (85 g) chicken breast.  6 oz (170 g) brown rice.  4 oz (113 g) corn.  8 oz (240 mL) milk.  8 oz (170 g) strawberries with sugar-free whipped topping. Carbohydrate calculation 1. Identify the foods that contain  carbohydrates: ? Rice. ? Corn. ? Milk. ? Strawberries. 2. Calculate how many servings you have of each food: ? 2 servings rice. ? 1 serving corn. ? 1 serving milk. ? 1 serving strawberries. 3. Multiply each number of servings by 15 g: ? 2 servings rice x 15 g = 30 g. ? 1 serving corn x 15 g = 15 g. ? 1 serving milk x 15 g = 15 g. ? 1 serving strawberries x 15 g = 15 g. 4. Add together all of the amounts to find the total grams of carbohydrates eaten: ? 30 g + 15 g + 15 g + 15 g = 75 g of carbohydrates total. Summary  Carbohydrate counting is a method of keeping track of how many carbohydrates you eat.  Eating carbohydrates naturally increases the amount of sugar (glucose) in the blood.  Counting how many carbohydrates you eat helps keep your blood glucose within normal limits, which helps you manage your diabetes.  A diet and nutrition specialist (registered dietitian) can help you make a meal plan and calculate how many carbohydrates you should have at each meal and snack. This information is not intended to replace advice given to you by your health care provider. Make sure you discuss any questions you have with your health care provider. Document Released: 09/05/2005 Document Revised: 03/15/2017 Document Reviewed: 02/17/2016 Elsevier Interactive Patient Education  2019 [employer].

## 2018-09-27 NOTE — Assessment & Plan Note (Signed)
hgba1c acceptable, minimize simple carbs. Increase exercise as tolerated. Continue Victoza, glimeperide and increase Lantus to 12 units then up by 2 units every 3 days until numbers start to approach 100 then stop increasing. Eat small frequent meals, with lean proteins, healthy fats. Referred to nutritionist for evaluation

## 2018-09-27 NOTE — Assessment & Plan Note (Signed)
Encouraged DASH diet, decrease po intake and increase exercise as tolerated. Needs 7-8 hours of sleep nightly. Avoid trans fats, eat small, frequent meals every 4-5 hours with lean proteins, complex carbs and healthy fats. Minimize simple carbs 

## 2018-09-28 LAB — LIPID PANEL
Cholesterol: 168 mg/dL (ref 0–200)
HDL: 35 mg/dL — ABNORMAL LOW (ref >39.00)
LDL Cholesterol: 105 mg/dL — ABNORMAL HIGH (ref 0–99)
NonHDL: 133.31
Total CHOL/HDL Ratio: 5
Triglycerides: 143 mg/dL (ref 0.0–149.0)
VLDL: 28.6 mg/dL (ref 0.0–40.0)

## 2018-09-28 LAB — COMPREHENSIVE METABOLIC PANEL
ALT: 10 U/L (ref 0–35)
AST: 10 U/L (ref 0–37)
Albumin: 4 g/dL (ref 3.5–5.2)
Alkaline Phosphatase: 88 U/L (ref 39–117)
BUN: 13 mg/dL (ref 6–23)
CO2: 30 mEq/L (ref 19–32)
Calcium: 9.9 mg/dL (ref 8.4–10.5)
Chloride: 102 mEq/L (ref 96–112)
Creatinine, Ser: 0.66 mg/dL (ref 0.40–1.20)
GFR: 119.18 mL/min (ref >60.00)
Glucose, Bld: 143 mg/dL — ABNORMAL HIGH (ref 70–99)
Potassium: 4.1 mEq/L (ref 3.5–5.1)
Sodium: 141 mEq/L (ref 135–145)
Total Bilirubin: 0.4 mg/dL (ref 0.2–1.2)
Total Protein: 6.8 g/dL (ref 6.0–8.3)

## 2018-09-28 LAB — URINALYSIS
Bilirubin Urine: NEGATIVE
Hgb urine dipstick: NEGATIVE
Ketones, ur: NEGATIVE
Leukocytes, UA: NEGATIVE
Nitrite: NEGATIVE
Specific Gravity, Urine: 1.02 (ref 1.000–1.030)
Total Protein, Urine: NEGATIVE
Urine Glucose: 100 — AB
Urobilinogen, UA: 0.2 (ref 0.0–1.0)
pH: 5.5 (ref 5.0–8.0)

## 2018-09-28 LAB — URINE CULTURE
MICRO NUMBER:: 33622
Result:: NO GROWTH
SPECIMEN QUALITY:: ADEQUATE

## 2018-09-28 LAB — HEMOGLOBIN A1C: Hgb A1c MFr Bld: 10.4 % — ABNORMAL HIGH (ref 4.6–6.5)

## 2018-09-28 LAB — TSH: TSH: 1.07 u[IU]/mL (ref 0.35–4.50)

## 2018-09-28 LAB — CBC
HCT: 42.3 % (ref 36.0–46.0)
Hemoglobin: 13.9 g/dL (ref 12.0–15.0)
MCHC: 32.8 g/dL (ref 30.0–36.0)
MCV: 91.7 fl (ref 78.0–100.0)
Platelets: 440 10*3/uL — ABNORMAL HIGH (ref 150.0–400.0)
RBC: 4.61 Mil/uL (ref 3.87–5.11)
RDW: 13.9 % (ref 11.5–15.5)
WBC: 10.5 10*3/uL (ref 4.0–10.5)

## 2018-09-30 NOTE — Progress Notes (Signed)
Subjective:    Patient ID: Eileen Richards, female    DOB: September 01, 1961, 58 y.o.   MRN: [id number]  No chief complaint on file.   HPI Patient is in today for follow up. She feels well today. No recent febrile illness or hospitalizations. No polyuria or polydipsia. Is trying to maintain a heart healthy diet. Denies CP/palp/SOB/HA/congestion/fevers/GI or GU c/o. Taking meds as prescribed. Endorses fatigue.   Past Medical History:  Diagnosis Date  . Achilles tendonitis 10/14/2012  . Asthma    childhood  . Atypical chest pain    normal stress echo 02/2009 -LV size was normal LV global systolic function was normal- Normal wall motion; no LV regional wall motion abnormalities  . Benign paroxysmal positional vertigo 04/26/2015  . Chest pain, atypical 08/24/2013  . Diabetes mellitus   . GERD (gastroesophageal reflux disease)   . Hand pain, left 11/10/2015  . Hyperlipemia   . Hypertension   . Neuropathic pain of ankle, left 04/27/2017  . Onychomycosis 04/17/2014  . Pain of right heel 10/14/2012  . Preventative health care 11/22/2015  . Sinusitis, acute 01/17/2016  . Thoracic outlet syndrome 11/03/2014  . Urinary urgency 12/15/2015    Past Surgical History:  Procedure Laterality Date  . ABDOMINAL HYSTERECTOMY    . CYSTOSCOPY  07/23/2012   Procedure: CYSTOSCOPY;  Surgeon: Marvene Staff, MD;  Location: [facility];  Service: Gynecology;  Laterality: N/A;  . DILATION AND CURETTAGE OF UTERUS     Lysis of Adhesions  . ROBOTIC ASSISTED LAPAROSCOPIC LYSIS OF ADHESION  07/23/2012   Procedure: ROBOTIC ASSISTED LAPAROSCOPIC LYSIS OF ADHESION;  Surgeon: Marvene Staff, MD;  Location: [facility];  Service: Gynecology;  Laterality: N/A;  . ROBOTIC ASSISTED SALPINGO OOPHERECTOMY  07/23/2012   Procedure: ROBOTIC ASSISTED SALPINGO OOPHERECTOMY;  Surgeon: Marvene Staff, MD;  Location: [facility];  Service: Gynecology;  Laterality: Bilateral;    Family History  Problem Relation Age of Onset  . GER disease  Mother   . GER disease Father   . Heart disease Father        father has "heart problems"  . Anemia Father     Social History   Socioeconomic History  . Marital status: Single    Spouse name: Not on file  . Number of children: Not on file  . Years of education: Not on file  . Highest education level: Not on file  Occupational History  . Not on file  Social Needs  . Financial resource strain: Not on file  . Food insecurity:    Worry: Not on file    Inability: Not on file  . Transportation needs:    Medical: Not on file    Non-medical: Not on file  Tobacco Use  . Smoking status: Former Research scientist (life sciences)  . Smokeless tobacco: Never Used  Substance and Sexual Activity  . Alcohol use: No  . Drug use: No  . Sexual activity: Not on file  Lifestyle  . Physical activity:    Days per week: Not on file    Minutes per session: Not on file  . Stress: Not on file  Relationships  . Social connections:    Talks on phone: Not on file    Gets together: Not on file    Attends religious service: Not on file    Active member of club or organization: Not on file    Attends meetings of clubs or organizations: Not on file    Relationship status: Not on file  .  Intimate partner violence:    Fear of current or ex partner: Not on file    Emotionally abused: Not on file    Physically abused: Not on file    Forced sexual activity: Not on file  Other Topics Concern  . Not on file  Social History Narrative  . Not on file    Outpatient Medications Prior to Visit  Medication Sig Dispense Refill  . ACCU-CHEK FASTCLIX LANCETS MISC Use as directed twice daily to check blood sugar.  DX E11.9 102 each 6  . albuterol (PROVENTIL HFA;VENTOLIN HFA) 108 (90 Base) MCG/ACT inhaler Inhale 2 puffs into the lungs every 6 (six) hours as needed for wheezing or shortness of breath. 1 Inhaler 6  . aspirin 81 MG tablet Take 81 mg by mouth daily.    Marland Kitchen atorvastatin (LIPITOR) 20 MG tablet Take 1 tablet (20 mg total) by  mouth daily. 90 tablet 3  . Blood Glucose Monitoring Suppl (ACCU-CHEK GUIDE) w/Device KIT Inject 1 kit as directed 2 (two) times daily. Use to check blood sugar.  DX E11.9 1 kit 0  . carvedilol (COREG) 3.125 MG tablet Take 1 tablet (3.125 mg total) by mouth 2 (two) times daily with a meal. 60 tablet 11  . glimepiride (AMARYL) 4 MG tablet Take 1 tablet (4 mg total) by mouth 2 (two) times daily. 60 tablet 6  . glucose blood (ACCU-CHEK GUIDE) test strip Use as directed twice daily to check blood sugar.  DX E11.9 100 each 6  . hydrochlorothiazide (HYDRODIURIL) 25 MG tablet Take 1 tablet (25 mg total) by mouth daily. 90 tablet 5  . hyoscyamine (LEVSIN SL) 0.125 MG SL tablet Place 1 tablet (0.125 mg total) under the tongue every 4 (four) hours as needed. 30 tablet 0  . Insulin Glargine (LANTUS SOLOSTAR) 100 UNIT/ML Solostar Pen Inject 10 Units into the skin daily. 5 pen 3  . Lancet Device MISC by Does not apply route. Use to test blood sugar three times daily     . liraglutide (VICTOZA) 18 MG/3ML SOPN Inject 0.3 mLs (1.8 mg total) into the skin daily. 6 mL 6  . meclizine (ANTIVERT) 25 MG tablet Take 1 tablet (25 mg total) by mouth 3 (three) times daily as needed for dizziness. 30 tablet 0  . metFORMIN (GLUCOPHAGE-XR) 500 MG 24 hr tablet TAKE 2 TABLETS BY MOUTH TWICE DAILY AND 1 TABLET AT NOON 150 tablet 5  . nitroGLYCERIN (NITROSTAT) 0.4 MG SL tablet Place 1 tablet (0.4 mg total) under the tongue every 5 (five) minutes x 3 doses as needed for chest pain. 40 tablet 1  . pantoprazole (PROTONIX) 40 MG tablet Take 1 tablet (40 mg total) by mouth daily. 30 tablet 5  . potassium chloride SA (K-DUR,KLOR-CON) 20 MEQ tablet Take 1 tablet (20 mEq total) by mouth daily. 30 tablet 3  . promethazine (PHENERGAN) 25 MG tablet Take 1 tablet (25 mg total) by mouth every 8 (eight) hours as needed for nausea or vomiting. 20 tablet 0  . sitaGLIPtin (JANUVIA) 100 MG tablet Take 1 tablet (100 mg total) by mouth daily. 30 tablet  6  . tiZANidine (ZANAFLEX) 4 MG tablet Take 1 tablet (4 mg total) by mouth every 6 (six) hours as needed for muscle spasms. 40 tablet 1  . traMADol (ULTRAM) 50 MG tablet TAKE 2 TABLETS BY MOUTH TWICE DAILY AS NEEDED 60 tablet 0  . UNIFINE PENTIPS 31G X 5 MM MISC USE AS DIRECTED 100 each 2   No facility-administered  medications prior to visit.     Allergies  Allergen Reactions  . Wilder Glade [Dapagliflozin] Hives    Review of Systems  Constitutional: Positive for malaise/fatigue. Negative for fever.  HENT: Negative for congestion.   Eyes: Negative for blurred vision.  Respiratory: Negative for shortness of breath.   Cardiovascular: Negative for chest pain, palpitations and leg swelling.  Gastrointestinal: Negative for abdominal pain, blood in stool and nausea.  Genitourinary: Negative for dysuria and frequency.  Musculoskeletal: Negative for falls.  Skin: Negative for rash.  Neurological: Negative for dizziness, loss of consciousness and headaches.  Endo/Heme/Allergies: Negative for environmental allergies.  Psychiatric/Behavioral: Negative for depression. The patient is not nervous/anxious.        Objective:    Physical Exam Vitals signs and nursing note reviewed.  Constitutional:      General: She is not in acute distress.    Appearance: She is well-developed.  HENT:     Head: Normocephalic and atraumatic.     Nose: Nose normal.  Eyes:     General:        Right eye: No discharge.        Left eye: No discharge.  Neck:     Musculoskeletal: Normal range of motion and neck supple.  Cardiovascular:     Rate and Rhythm: Normal rate and regular rhythm.     Heart sounds: No murmur.  Pulmonary:     Effort: Pulmonary effort is normal.     Breath sounds: Normal breath sounds.  Abdominal:     General: Bowel sounds are normal.     Palpations: Abdomen is soft.     Tenderness: There is no abdominal tenderness.  Skin:    General: Skin is warm and dry.  Neurological:     Mental  Status: She is alert and oriented to person, place, and time.     BP 128/82 (BP Location: Left Arm, Patient Position: Sitting, Cuff Size: Normal)   Pulse 77   Temp 98.1 F (36.7 C) (Oral)   Resp 18   Wt 235 lb 6.4 oz (106.8 kg)   SpO2 97%   BMI 40.41 kg/m  Wt Readings from Last 3 Encounters:  09/27/18 235 lb 6.4 oz (106.8 kg)  08/20/18 241 lb 3.2 oz (109.4 kg)  07/19/18 236 lb 3.2 oz (107.1 kg)     Lab Results  Component Value Date   WBC 10.5 09/27/2018   HGB 13.9 09/27/2018   HCT 42.3 09/27/2018   PLT 440.0 (H) 09/27/2018   GLUCOSE 143 (H) 09/27/2018   CHOL 168 09/27/2018   TRIG 143.0 09/27/2018   HDL 35.00 (L) 09/27/2018   LDLDIRECT 100.0 08/03/2017   LDLCALC 105 (H) 09/27/2018   ALT 10 09/27/2018   AST 10 09/27/2018   NA 141 09/27/2018   K 4.1 09/27/2018   CL 102 09/27/2018   CREATININE 0.66 09/27/2018   BUN 13 09/27/2018   CO2 30 09/27/2018   TSH 1.07 09/27/2018   HGBA1C 10.4 (H) 09/27/2018   MICROALBUR 0.9 11/12/2015    Lab Results  Component Value Date   TSH 1.07 09/27/2018   Lab Results  Component Value Date   WBC 10.5 09/27/2018   HGB 13.9 09/27/2018   HCT 42.3 09/27/2018   MCV 91.7 09/27/2018   PLT 440.0 (H) 09/27/2018   Lab Results  Component Value Date   NA 141 09/27/2018   K 4.1 09/27/2018   CO2 30 09/27/2018   GLUCOSE 143 (H) 09/27/2018   BUN 13 09/27/2018  CREATININE 0.66 09/27/2018   BILITOT 0.4 09/27/2018   ALKPHOS 88 09/27/2018   AST 10 09/27/2018   ALT 10 09/27/2018   PROT 6.8 09/27/2018   ALBUMIN 4.0 09/27/2018   CALCIUM 9.9 09/27/2018   GFR 119.18 09/27/2018   Lab Results  Component Value Date   CHOL 168 09/27/2018   Lab Results  Component Value Date   HDL 35.00 (L) 09/27/2018   Lab Results  Component Value Date   LDLCALC 105 (H) 09/27/2018   Lab Results  Component Value Date   TRIG 143.0 09/27/2018   Lab Results  Component Value Date   CHOLHDL 5 09/27/2018   Lab Results  Component Value Date   HGBA1C  10.4 (H) 09/27/2018       Assessment & Plan:   Problem List Items Addressed This Visit    Diabetes mellitus type 2 in obese ([place])    hgba1c acceptable, minimize simple carbs. Increase exercise as tolerated. Continue Victoza, glimeperide and increase Lantus to 12 units then up by 2 units every 3 days until numbers start to approach 100 then stop increasing. Eat small frequent meals, with lean proteins, healthy fats. Referred to nutritionist for evaluation      Relevant Orders   Amb ref to Medical Nutrition Therapy-MNT   Hemoglobin A1c (Completed)   Hyperlipidemia, mixed   Relevant Orders   Lipid panel (Completed)   Obesity    Encouraged DASH diet, decrease po intake and increase exercise as tolerated. Needs 7-8 hours of sleep nightly. Avoid trans fats, eat small, frequent meals every 4-5 hours with lean proteins, complex carbs and healthy fats. Minimize simple carbs      Essential hypertension    Well controlled, no changes to meds. Encouraged heart healthy diet such as the DASH diet and exercise as tolerated.       Relevant Orders   CBC (Completed)   Comprehensive metabolic panel (Completed)   TSH (Completed)    Other Visit Diagnoses    Urinary hesitancy    -  Primary   Relevant Orders   Urinalysis (Completed)   Urine Culture (Completed)      I am having Eileen Richards start on cetirizine and famotidine. I am also having her maintain her Lancet Device, aspirin, meclizine, promethazine, metFORMIN, tiZANidine, glucose blood, ACCU-CHEK FASTCLIX LANCETS, hyoscyamine, carvedilol, sitaGLIPtin, atorvastatin, liraglutide, potassium chloride SA, pantoprazole, glimepiride, albuterol, hydrochlorothiazide, ACCU-CHEK GUIDE, nitroGLYCERIN, UNIFINE PENTIPS, traMADol, and Insulin Glargine.  Meds ordered this encounter  Medications  . cetirizine (ZYRTEC) 10 MG tablet    Sig: Take 1 tablet (10 mg total) by mouth 2 (two) times daily.    Dispense:  60 tablet    Refill:  3  . famotidine  (PEPCID) 20 MG tablet    Sig: Take 1 tablet (20 mg total) by mouth 2 (two) times daily.    Dispense:  60 tablet    Refill:  2     Penni Homans, MD

## 2018-10-02 MED FILL — JANUVIA 100 MG TABLET: 100 | 30 days supply | Qty: 30 | Fill #4

## 2018-10-02 MED FILL — CARVEDILOL 3.125 MG TABLET: 3.125 | 30 days supply | Qty: 60 | Fill #3

## 2018-10-02 MED FILL — PANTOPRAZOLE SOD DR 40 MG T: 40 | 30 days supply | Qty: 30 | Fill #3

## 2018-10-02 MED FILL — LANTUS SOLOSTAR 100 UNITS/M: 100 | 28 days supply | Qty: 3 | Fill #2

## 2018-10-02 MED FILL — GLIMEPIRIDE 4 MG TABLET: 4 | 30 days supply | Qty: 60 | Fill #3

## 2018-10-03 MED FILL — NITROGLYCERIN 0.4 MG TAB SL: 0.4 | 25 days supply | Qty: 25 | Fill #1

## 2018-10-11 ENCOUNTER — Encounter: Payer: Self-pay | Admitting: Registered"

## 2018-10-11 ENCOUNTER — Encounter: Payer: 59 | Attending: Family Medicine | Admitting: Registered"

## 2018-10-11 DIAGNOSIS — E119 Type 2 diabetes mellitus without complications: Secondary | ICD-10-CM | POA: Insufficient documentation

## 2018-10-11 NOTE — Progress Notes (Signed)
Diabetes Self-Management Education  Visit Type:  First/Initial  Appt. Start Time: 9:50 Appt. End Time: 10:50  10/11/2018  [name], identified by name and date of birth, is a 58 y.o. female with a diagnosis of Diabetes: Type 2.   ASSESSMENT  Pt expectations: know how to eat healthy to get blood sugar right  Pt arrives stating she has had diabetes for awhile. Pt states she checks her BS 1-2 times a day, FBS and sometimes later in the day. Pt states her frequency of checking BS and eating meals depends on how busy she is at work; sometimes skips breakfast. Pt states she works full-time for [employer] and has other side jobs.    There were no vitals taken for this visit. There is no height or weight on file to calculate BMI.   Diabetes Self-Management Education - 10/11/18 0956      Health Coping   How would you rate your overall health?  Good      Psychosocial Assessment   Patient Belief/Attitude about Diabetes  Motivated to manage diabetes    Self-care barriers  None    Self-management support  None    Patient Concerns  Nutrition/Meal planning    Special Needs  None    Preferred Learning Style  No preference indicated    Learning Readiness  Ready      Complications   Last HgB A1C per patient/outside source  10.4 %    How often do you check your blood sugar?  1-2 times/day    Fasting Blood glucose range (mg/dL)  70-129;130-179   99-150   Postprandial Blood glucose range (mg/dL)  >200   250   Number of hypoglycemic episodes per month  3    Can you tell when your blood sugar is low?  Yes    What do you do if your blood sugar is low?  eat a snack such as an apple    Number of hyperglycemic episodes per week  5    Can you tell when your blood sugar is high?  No    What do you do if your blood sugar is high?  nothing    Have you had a dilated eye exam in the past 12 months?  No    Have you had a dental exam in the past 12 months?  Yes    Are you checking your feet?  Yes     How many days per week are you checking your feet?  1      Dietary Intake   Breakfast  grits + cheese    Snack (morning)  none    Lunch  Restaurant-chicken wings + crab ragoon    Snack (afternoon)  sometimes fruit    Dinner  2-3 chicken wings    Snack (evening)  none    Beverage(s)  water, pepsi, mountain dew, sweet tea, coffee, juice (sometimes)      Exercise   Exercise Type  ADL's    How many days per week to you exercise?  0    How many minutes per day do you exercise?  0    Total minutes per week of exercise  0      Patient Education   Previous Diabetes Education  Yes (please comment)   participates in [place]   Disease state   Definition of diabetes, type 1 and 2, and the diagnosis of diabetes;Factors that contribute to the development of diabetes    Nutrition management  Role of diet in the treatment of diabetes and the relationship between the three main macronutrients and blood glucose level;Food label reading, portion sizes and measuring food.;Reviewed blood glucose goals for pre and post meals and how to evaluate the patients' food intake on their blood glucose level.;Carbohydrate counting;Effects of alcohol on blood glucose and safety factors with consumption of alcohol.;Information on hints to eating out and maintain blood glucose control.    Physical activity and exercise   Role of exercise on diabetes management, blood pressure control and cardiac health.;Helped patient identify appropriate exercises in relation to his/her diabetes, diabetes complications and other health issue.    Monitoring  Purpose and frequency of SMBG.;Taught/discussed recording of test results and interpretation of SMBG.;Identified appropriate SMBG and/or A1C goals.;Daily foot exams;Yearly dilated eye exam    Acute complications  Taught treatment of hypoglycemia - the 15 rule.;Discussed and identified patients' treatment of hyperglycemia.    Psychosocial adjustment  Role of stress on  diabetes;Identified and addressed patients feelings and concerns about diabetes;Helped patient identify a support system for diabetes management      Individualized Goals (developed by patient)   Nutrition  Follow meal plan discussed;General guidelines for healthy choices and portions discussed    Physical Activity  Exercise 1-2 times per week;Exercise 3-5 times per week;15 minutes per day    Medications  take my medication as prescribed    Monitoring   test my blood glucose as discussed;test blood glucose pre and post meals as discussed    Reducing Risk  examine blood glucose patterns;do foot checks daily;treat hypoglycemia with 15 grams of carbs if blood glucose less than 70mg /dL    Health Coping  discuss diabetes with (comment)   discuss with PCP when sick     Post-Education Assessment   Patient understands the diabetes disease and treatment process.  Demonstrates understanding / competency    Patient understands incorporating nutritional management into lifestyle.  Demonstrates understanding / competency    Patient undertands incorporating physical activity into lifestyle.  Demonstrates understanding / competency    Patient understands using medications safely.  Demonstrates understanding / competency    Patient understands monitoring blood glucose, interpreting and using results  Demonstrates understanding / competency    Patient understands prevention, detection, and treatment of acute complications.  Demonstrates understanding / competency    Patient understands prevention, detection, and treatment of chronic complications.  Needs Review    Patient understands how to develop strategies to address psychosocial issues.  Needs Review    Patient understands how to develop strategies to promote health/change behavior.  Demonstrates understanding / competency      Outcomes   Program Status  Not Completed       Learning Objective:  Patient will have a greater understanding of diabetes  self-management. Patient education plan is to attend individual and/or group sessions per assessed needs and concerns.   Plan:   Patient Instructions  Goals:  Follow Diabetes Meal Plan as instructed  Eat 3 meals and 2 snacks, every 3-5 hrs  Limit carbohydrate intake to 30-45 grams carbohydrate/meal  Limit carbohydrate intake to 15-30 grams carbohydrate/snack  Add lean protein foods to meals/snacks  Monitor glucose levels as instructed by your doctor  Aim for 15 mins of physical activity, at least 3 days a week. Use treadmill/gym at work.   Bring food record and glucose log to your next nutrition visit     Expected Outcomes:  Demonstrated interest in learning. Expect positive outcomes  Education material provided: ADA  Diabetes: Your Take Control Guide and Carbohydrate counting sheet  If problems or questions, patient to contact team via:  Phone and Email  Future DSME appointment: - 4-6 wks

## 2018-10-11 NOTE — Patient Instructions (Signed)
Goals:  Follow Diabetes Meal Plan as instructed  Eat 3 meals and 2 snacks, every 3-5 hrs  Limit carbohydrate intake to 30-45 grams carbohydrate/meal  Limit carbohydrate intake to 15-30 grams carbohydrate/snack  Add lean protein foods to meals/snacks  Monitor glucose levels as instructed by your doctor  Aim for 15 mins of physical activity, at least 3 days a week. Use treadmill/gym at work.   Bring food record and glucose log to your next nutrition visit

## 2018-10-12 ENCOUNTER — Other Ambulatory Visit: Payer: Self-pay | Admitting: Family Medicine

## 2018-10-12 MED FILL — UNIFINE PENTIPS 31GX3/16: 31G X 5 MM | 90 days supply | Qty: 100 | Fill #1

## 2018-10-12 MED FILL — ACCU-CHEK FASTCLIX LANCETS: 50 days supply | Qty: 102 | Fill #1

## 2018-10-12 MED FILL — FAMOTIDINE 20 MG TABLET: 20 | 30 days supply | Qty: 60 | Fill #1

## 2018-10-12 MED FILL — ACCU-CHEK GUIDE STRP: 50 days supply | Qty: 100 | Fill #2

## 2018-10-12 MED FILL — traMADol HCL 50 MG TABS: 50 | 15 days supply | Qty: 60 | Fill #0

## 2018-10-12 MED FILL — UNIFINE PENTIPS 31GX3/16": 31G X 5 MM | 90 days supply | Qty: 100 | Fill #1

## 2018-10-12 MED FILL — VICTOZA 2-PAK 18 MG/3 ML PE: 18 | 20 days supply | Qty: 6 | Fill #4

## 2018-10-12 NOTE — Telephone Encounter (Signed)
Requesting:tramadol Contract:no UDS: [employer]  Last OV:09/27/18 Next OV:01/31/19 Last Refill:07/19/18  #[id number] Database:   Please advise

## 2018-10-15 ENCOUNTER — Encounter: Payer: Self-pay | Admitting: Family Medicine

## 2018-10-16 MED ORDER — INSULIN GLARGINE 100 UNIT/ML SOLOSTAR PEN: 18.0000 [IU] | PEN_INJECTOR | Freq: Every day | SUBCUTANEOUS | 3 refills | Status: DC

## 2018-10-16 MED FILL — LANTUS SOLOSTAR 100 UNITS/M: 100 | 83 days supply | Qty: 15 | Fill #0

## 2018-10-18 MED FILL — metFORMIN HCL ER 500 MG TB2: 500 | 30 days supply | Qty: 150 | Fill #2

## 2018-11-06 ENCOUNTER — Other Ambulatory Visit: Payer: Self-pay | Admitting: Family Medicine

## 2018-11-06 MED FILL — CARVEDILOL 3.125 MG TABLET: 3.125 | 30 days supply | Qty: 60 | Fill #4

## 2018-11-06 MED FILL — NAPROXEN 500 MG TABLET: 500 | 30 days supply | Qty: 60 | Fill #1

## 2018-11-06 MED FILL — VICTOZA 2-PAK 18 MG/3 ML PE: 18 | 20 days supply | Qty: 6 | Fill #5

## 2018-11-06 MED FILL — GLIMEPIRIDE 4 MG TABLET: 4 | 30 days supply | Qty: 60 | Fill #4

## 2018-11-06 MED FILL — JANUVIA 100 MG TABLET: 100 | 30 days supply | Qty: 30 | Fill #5

## 2018-11-06 MED FILL — POTASSIUM CHLORIDE CRYS ER: 20 | 30 days supply | Qty: 30 | Fill #3

## 2018-11-06 MED FILL — PANTOPRAZOLE SOD DR 40 MG T: 40 | 30 days supply | Qty: 30 | Fill #4

## 2018-11-07 MED FILL — traMADol HCL 50 MG TABS: 50 | 15 days supply | Qty: 60 | Fill #0

## 2018-11-07 NOTE — Telephone Encounter (Signed)
Requesting:tramadol Contract:yes UDS:N/A [employer] employee Last OV:09/27/18 Next OV:01/31/19 Last Refill:10/12/18  #[id number] Database:   Please advise

## 2018-11-08 DIAGNOSIS — H40013 Open angle with borderline findings, low risk, bilateral: Secondary | ICD-10-CM | POA: Diagnosis not present

## 2018-11-12 MED FILL — metFORMIN HCL ER 500 MG TB2: 500 | 30 days supply | Qty: 150 | Fill #0

## 2018-11-15 ENCOUNTER — Ambulatory Visit: Payer: Self-pay | Admitting: Registered"

## 2018-11-29 ENCOUNTER — Other Ambulatory Visit: Payer: Self-pay

## 2018-11-29 ENCOUNTER — Encounter: Payer: Self-pay | Admitting: Registered"

## 2018-11-29 ENCOUNTER — Encounter: Payer: 59 | Attending: Family Medicine | Admitting: Registered"

## 2018-11-29 DIAGNOSIS — E119 Type 2 diabetes mellitus without complications: Secondary | ICD-10-CM | POA: Diagnosis not present

## 2018-11-29 NOTE — Patient Instructions (Signed)
Goals:  Follow Diabetes Meal Plan as instructed  Eat 3 meals and 2 snacks, every 3-5 hrs  Limit carbohydrate intake to 30-45 grams carbohydrate/meal  Limit carbohydrate intake to 15-30 grams carbohydrate/snack  Add lean protein foods to meals/snacks  Monitor glucose levels as instructed by your doctor  Aim for at least 15 min, 3 days/week of physical activity. Goal is at least 30 min, 5 days/week  Bring food record and glucose log to your next nutrition visit  Aim to have balanced meals for lunch. You are doing a great job of balancing dinner!

## 2018-11-29 NOTE — Progress Notes (Signed)
Diabetes Self-Management Education  Visit Type:  Follow-up  Appt. Start Time: 4:53 Appt. End Time: 5:20  11/29/2018  [name], identified by name and date of birth, is a 58 y.o. female with a diagnosis of Diabetes: Type 2.   ASSESSMENT  Pt states she has not been checking her BS often, check about 1-2 times/week. Reports recognizing she needs to do better. Pt states she has not been incorporating physical activity.   There were no vitals taken for this visit. There is no height or weight on file to calculate BMI.   Diabetes Self-Management Education - 11/29/18 1657      Psychosocial Assessment   Self-care barriers  None      Complications   How often do you check your blood sugar?  --   checks a 1-2 times a week, not daily   Fasting Blood glucose range (mg/dL)  130-179;180-200   130-180   Postprandial Blood glucose range (mg/dL)  70-129;130-179   128-131   Number of hypoglycemic episodes per month  0    Can you tell when your blood sugar is low?  Yes    Number of hyperglycemic episodes per week  0    Can you tell when your blood sugar is high?  No    Are you checking your feet?  Yes    How many days per week are you checking your feet?  7      Dietary Intake   Breakfast  bagel + cream cheese    Snack (morning)  none    Lunch  Social research officer, government (afternoon)  none    Dinner  [employer] + corn on the cob + cabbage    Snack (evening)  kettle chips     Beverage(s)  water (~72 oz)      Exercise   Exercise Type  ADL's    How many days per week to you exercise?  0    How many minutes per day do you exercise?  0    Total minutes per week of exercise  0      Patient Education   Previous Diabetes Education  Yes (please comment)    Disease state   Definition of diabetes, type 1 and 2, and the diagnosis of diabetes;Factors that contribute to the development of diabetes    Nutrition management   Reviewed blood glucose goals for pre and post meals and how to  evaluate the patients' food intake on their blood glucose level.;Role of diet in the treatment of diabetes and the relationship between the three main macronutrients and blood glucose level    Physical activity and exercise   Role of exercise on diabetes management, blood pressure control and cardiac health.    Monitoring  Purpose and frequency of SMBG.;Identified appropriate SMBG and/or A1C goals.;Daily foot exams;Yearly dilated eye exam;Interpreting lab values - A1C, lipid, urine microalbumina.;Taught/discussed recording of test results and interpretation of SMBG.    Acute complications  Taught treatment of hypoglycemia - the 15 rule.;Discussed and identified patients' treatment of hyperglycemia.    Chronic complications  Relationship between chronic complications and blood glucose control;Assessed and discussed foot care and prevention of foot problems;Identified and discussed with patient  current chronic complications;Retinopathy and reason for yearly dilated eye exams;Reviewed with patient heart disease, higher risk of, and prevention;Nephropathy, what it is, prevention of, the use of ACE, ARB's and early detection of through urine microalbumia.;Lipid levels, blood glucose control and heart disease  Psychosocial adjustment  Worked with patient to identify barriers to care and solutions;Helped patient identify a support system for diabetes management    Personal strategies to promote health  Lifestyle issues that need to be addressed for better diabetes care      Individualized Goals (developed by patient)   Nutrition  Follow meal plan discussed;General guidelines for healthy choices and portions discussed    Physical Activity  Exercise 3-5 times per week;15 minutes per day    Medications  take my medication as prescribed    Monitoring   test my blood glucose as discussed;test blood glucose pre and post meals as discussed    Reducing Risk  examine blood glucose patterns      Post-Education  Assessment   Patient understands the diabetes disease and treatment process.  Demonstrates understanding / competency    Patient understands incorporating nutritional management into lifestyle.  Demonstrates understanding / competency    Patient undertands incorporating physical activity into lifestyle.  Demonstrates understanding / competency    Patient understands using medications safely.  Demonstrates understanding / competency    Patient understands monitoring blood glucose, interpreting and using results  Demonstrates understanding / competency    Patient understands prevention, detection, and treatment of acute complications.  Demonstrates understanding / competency    Patient understands prevention, detection, and treatment of chronic complications.  Demonstrates understanding / competency    Patient understands how to develop strategies to address psychosocial issues.  Demonstrates understanding / competency    Patient understands how to develop strategies to promote health/change behavior.  Demonstrates understanding / competency      Outcomes   Program Status  Completed      Subsequent Visit   Since your last visit have you continued or begun to take your medications as prescribed?  Yes    Since your last visit have you had your blood pressure checked?  No    Since your last visit have you experienced any weight changes?  No change    Since your last visit, are you checking your blood glucose at least once a day?  No       Learning Objective:  Patient will have a greater understanding of diabetes self-management. Patient education plan is to attend individual and/or group sessions per assessed needs and concerns.   Plan:   Patient Instructions  Goals:  Follow Diabetes Meal Plan as instructed  Eat 3 meals and 2 snacks, every 3-5 hrs  Limit carbohydrate intake to 30-45 grams carbohydrate/meal  Limit carbohydrate intake to 15-30 grams carbohydrate/snack  Add lean  protein foods to meals/snacks  Monitor glucose levels as instructed by your doctor  Aim for at least 15 min, 3 days/week of physical activity. Goal is at least 30 min, 5 days/week  Bring food record and glucose log to your next nutrition visit  Aim to have balanced meals for lunch. You are doing a great job of balancing dinner!     Expected Outcomes:  Demonstrated interest in learning. Expect positive outcomes  Education material provided: ADA Diabetes: Your Take Control Guide  If problems or questions, patient to contact team via:  Phone and Email  Future DSME appointment: - Yearly

## 2018-11-30 MED FILL — VICTOZA 2-PAK 18 MG/3 ML PE: 18 | 20 days supply | Qty: 6 | Fill #6

## 2018-12-06 MED FILL — HYDROCHLOROTHIAZIDE 25 MG T: 25 | 90 days supply | Qty: 90 | Fill #2

## 2018-12-06 MED FILL — CARVEDILOL 3.125 MG TABLET: 3.125 | 30 days supply | Qty: 60 | Fill #5 | Status: TO

## 2018-12-06 MED FILL — POTASSIUM CHLORIDE CRYS ER: 20 | 30 days supply | Qty: 30 | Fill #0 | Status: TO

## 2018-12-06 MED FILL — PANTOPRAZOLE SOD DR 40 MG T: 40 | 30 days supply | Qty: 30 | Fill #5

## 2018-12-06 MED FILL — metFORMIN HCL ER 500 MG TB2: 500 | 30 days supply | Qty: 150 | Fill #1 | Status: TO

## 2018-12-06 MED FILL — GLIMEPIRIDE 4 MG TABLET: 4 | 30 days supply | Qty: 60 | Fill #5 | Status: TO

## 2018-12-06 MED FILL — JANUVIA 100 MG TABLET: 100 | 30 days supply | Qty: 30 | Fill #6

## 2018-12-06 MED FILL — ATORVASTATIN 20 MG TABLET: 20 | 90 days supply | Qty: 90 | Fill #1

## 2018-12-20 ENCOUNTER — Other Ambulatory Visit: Payer: Self-pay

## 2018-12-20 MED ORDER — INSULIN PEN NEEDLE 31G X 5 MM MISC: 2 refills | Status: DC

## 2018-12-20 MED ORDER — INSULIN GLARGINE 100 UNIT/ML SOLOSTAR PEN: 18.0000 [IU] | PEN_INJECTOR | Freq: Every day | SUBCUTANEOUS | 3 refills | Status: DC

## 2018-12-20 MED ORDER — LIRAGLUTIDE 18 MG/3ML ~~LOC~~ SOPN: 1.8000 mg | PEN_INJECTOR | Freq: Every day | SUBCUTANEOUS | 6 refills | Status: DC

## 2018-12-20 MED FILL — UNIFINE PENTIPS 31GX3/16: 31G X 5 MM | 90 days supply | Qty: 200 | Fill #0

## 2018-12-20 MED FILL — VICTOZA 18 MG/3 ML INJECT P: 18 | 30 days supply | Qty: 9 | Fill #0

## 2018-12-20 MED FILL — UNIFINE PENTIPS 31GX3/16": 31G X 5 MM | 90 days supply | Qty: 200 | Fill #0

## 2018-12-31 MED FILL — LANTUS SOLOSTAR 100 UNITS/M: 100 | 83 days supply | Qty: 15 | Fill #0

## 2019-01-22 MED FILL — metFORMIN HCL ER 500 MG TB2: 500 | 30 days supply | Qty: 150 | Fill #0

## 2019-01-22 MED FILL — CARVEDILOL 3.125 MG TABLET: 3.125 | 30 days supply | Qty: 60 | Fill #0

## 2019-01-22 MED FILL — JANUVIA 100 MG TABLET: 100 | 30 days supply | Qty: 30 | Fill #0

## 2019-01-22 MED FILL — GLIMEPIRIDE 4 MG TABLET: 4 | 30 days supply | Qty: 60 | Fill #0

## 2019-01-22 MED FILL — VICTOZA 18 MG/3 ML INJECT P: 18 | 30 days supply | Qty: 9 | Fill #1

## 2019-01-22 MED FILL — POTASSIUM CHLORIDE CRYS ER: 20 | 30 days supply | Qty: 30 | Fill #0

## 2019-01-31 ENCOUNTER — Ambulatory Visit (INDEPENDENT_AMBULATORY_CARE_PROVIDER_SITE_OTHER): Payer: 59 | Admitting: Family Medicine

## 2019-01-31 ENCOUNTER — Other Ambulatory Visit: Payer: Self-pay

## 2019-01-31 DIAGNOSIS — L509 Urticaria, unspecified: Secondary | ICD-10-CM | POA: Diagnosis not present

## 2019-01-31 DIAGNOSIS — K219 Gastro-esophageal reflux disease without esophagitis: Secondary | ICD-10-CM | POA: Diagnosis not present

## 2019-01-31 DIAGNOSIS — E782 Mixed hyperlipidemia: Secondary | ICD-10-CM

## 2019-01-31 DIAGNOSIS — M79645 Pain in left finger(s): Secondary | ICD-10-CM | POA: Diagnosis not present

## 2019-01-31 DIAGNOSIS — M79671 Pain in right foot: Secondary | ICD-10-CM

## 2019-01-31 DIAGNOSIS — I10 Essential (primary) hypertension: Secondary | ICD-10-CM

## 2019-01-31 DIAGNOSIS — R1310 Dysphagia, unspecified: Secondary | ICD-10-CM | POA: Diagnosis not present

## 2019-01-31 DIAGNOSIS — E1169 Type 2 diabetes mellitus with other specified complication: Secondary | ICD-10-CM | POA: Diagnosis not present

## 2019-01-31 DIAGNOSIS — G8929 Other chronic pain: Secondary | ICD-10-CM

## 2019-01-31 DIAGNOSIS — M79642 Pain in left hand: Secondary | ICD-10-CM | POA: Diagnosis not present

## 2019-01-31 DIAGNOSIS — E669 Obesity, unspecified: Secondary | ICD-10-CM

## 2019-01-31 MED ORDER — CETIRIZINE HCL 10 MG PO TABS: 10.0000 mg | ORAL_TABLET | Freq: Two times a day (BID) | ORAL | 1 refills | Status: DC

## 2019-01-31 MED ORDER — FAMOTIDINE 20 MG PO TABS: 20.0000 mg | ORAL_TABLET | Freq: Two times a day (BID) | ORAL | 1 refills | Status: DC

## 2019-01-31 NOTE — Assessment & Plan Note (Signed)
no changes to meds. Encouraged heart healthy diet such as the DASH diet and exercise as tolerated.  

## 2019-01-31 NOTE — Assessment & Plan Note (Signed)
Encouraged heart healthy diet, increase exercise, avoid trans fats, consider a krill oil cap daily 

## 2019-02-01 ENCOUNTER — Encounter: Payer: Self-pay | Admitting: Family Medicine

## 2019-02-03 DIAGNOSIS — M79671 Pain in right foot: Secondary | ICD-10-CM | POA: Insufficient documentation

## 2019-02-03 DIAGNOSIS — L509 Urticaria, unspecified: Secondary | ICD-10-CM | POA: Insufficient documentation

## 2019-02-03 NOTE — Assessment & Plan Note (Signed)
minimize simple carbs. Increase exercise as tolerated. Continue current meds  

## 2019-02-03 NOTE — Assessment & Plan Note (Signed)
Avoid offending foods, start probiotics. Do not eat large meals in late evening and consider raising head of bed. Famotidine 20 mg bid.

## 2019-02-03 NOTE — Progress Notes (Signed)
Virtual Visit via Video Note  I connected with Eileen Richards on 01/31/2019 at  2:20 PM EDT by a video enabled telemedicine application and verified that I am speaking with the correct person using two identifiers. Eileen Richards was able to get patient set up on video visit.   Location: Patient: home Provider: office   I discussed the limitations of evaluation and management by telemedicine and the availability of in person appointments. The patient expressed understanding and agreed to proceed.    Subjective:    Patient ID: Eileen Richards, female    DOB: 17-Apr-1961, 58 y.o.   MRN: [id number]  No chief complaint on file.   HPI Patient is in today for follow up on chronic medical concerns including diabetes, hypertension, hyperlipidemia and more. No recent febrile illness or hospitalizations. She is having left thumb pain after an injury. She also notes pain and numbness at bottom of right toes. With a knot noted. Sugars ranging from 95 to 200 but the nigh number was noted after eating sweets. She is still having episodes of hives regularly and some swelling of her lips also. Denies CP/palp/SOB/HA/congestion/fevers/GI or GU c/o. Taking meds as prescribed  Past Medical History:  Diagnosis Date  . Achilles tendonitis 10/14/2012  . Asthma    childhood  . Atypical chest pain    normal stress echo 02/2009 -LV size was normal LV global systolic function was normal- Normal wall motion; no LV regional wall motion abnormalities  . Benign paroxysmal positional vertigo 04/26/2015  . Chest pain, atypical 08/24/2013  . Diabetes mellitus   . GERD (gastroesophageal reflux disease)   . Hand pain, left 11/10/2015  . Hyperlipemia   . Hypertension   . Neuropathic pain of ankle, left 04/27/2017  . Onychomycosis 04/17/2014  . Pain of right heel 10/14/2012  . Preventative health care 11/22/2015  . Sinusitis, acute 01/17/2016  . Thoracic outlet syndrome 11/03/2014  . Urinary urgency 12/15/2015    Past  Surgical History:  Procedure Laterality Date  . ABDOMINAL HYSTERECTOMY    . CYSTOSCOPY  07/23/2012   Procedure: CYSTOSCOPY;  Surgeon: Marvene Staff, MD;  Location: [facility];  Service: Gynecology;  Laterality: N/A;  . DILATION AND CURETTAGE OF UTERUS     Lysis of Adhesions  . ROBOTIC ASSISTED LAPAROSCOPIC LYSIS OF ADHESION  07/23/2012   Procedure: ROBOTIC ASSISTED LAPAROSCOPIC LYSIS OF ADHESION;  Surgeon: Marvene Staff, MD;  Location: [facility];  Service: Gynecology;  Laterality: N/A;  . ROBOTIC ASSISTED SALPINGO OOPHERECTOMY  07/23/2012   Procedure: ROBOTIC ASSISTED SALPINGO OOPHERECTOMY;  Surgeon: Marvene Staff, MD;  Location: [facility];  Service: Gynecology;  Laterality: Bilateral;    Family History  Problem Relation Age of Onset  . GER disease Mother   . GER disease Father   . Heart disease Father        father has "heart problems"  . Anemia Father   . Asthma Other   . Hypertension Other   . Diabetes Other     Social History   Socioeconomic History  . Marital status: Single    Spouse name: Not on file  . Number of children: Not on file  . Years of education: Not on file  . Highest education level: Not on file  Occupational History  . Not on file  Social Needs  . Financial resource strain: Not on file  . Food insecurity:    Worry: Not on file    Inability: Not on file  . Transportation needs:  Medical: Not on file    Non-medical: Not on file  Tobacco Use  . Smoking status: Former Research scientist (life sciences)  . Smokeless tobacco: Never Used  Substance and Sexual Activity  . Alcohol use: No  . Drug use: No  . Sexual activity: Not on file  Lifestyle  . Physical activity:    Days per week: Not on file    Minutes per session: Not on file  . Stress: Not on file  Relationships  . Social connections:    Talks on phone: Not on file    Gets together: Not on file    Attends religious service: Not on file    Active member of club or organization: Not on file    Attends  meetings of clubs or organizations: Not on file    Relationship status: Not on file  . Intimate partner violence:    Fear of current or ex partner: Not on file    Emotionally abused: Not on file    Physically abused: Not on file    Forced sexual activity: Not on file  Other Topics Concern  . Not on file  Social History Narrative  . Not on file    Outpatient Medications Prior to Visit  Medication Sig Dispense Refill  . ACCU-CHEK FASTCLIX LANCETS MISC Use as directed twice daily to check blood sugar.  DX E11.9 102 each 6  . albuterol (PROVENTIL HFA;VENTOLIN HFA) 108 (90 Base) MCG/ACT inhaler Inhale 2 puffs into the lungs every 6 (six) hours as needed for wheezing or shortness of breath. 1 Inhaler 6  . aspirin 81 MG tablet Take 81 mg by mouth daily.    Marland Kitchen atorvastatin (LIPITOR) 20 MG tablet Take 1 tablet (20 mg total) by mouth daily. 90 tablet 3  . Blood Glucose Monitoring Suppl (ACCU-CHEK GUIDE) w/Device KIT Inject 1 kit as directed 2 (two) times daily. Use to check blood sugar.  DX E11.9 1 kit 0  . carvedilol (COREG) 3.125 MG tablet Take 1 tablet (3.125 mg total) by mouth 2 (two) times daily with a meal. 60 tablet 11  . glimepiride (AMARYL) 4 MG tablet Take 1 tablet (4 mg total) by mouth 2 (two) times daily. 60 tablet 6  . glucose blood (ACCU-CHEK GUIDE) test strip Use as directed twice daily to check blood sugar.  DX E11.9 100 each 6  . hydrochlorothiazide (HYDRODIURIL) 25 MG tablet Take 1 tablet (25 mg total) by mouth daily. 90 tablet 5  . hyoscyamine (LEVSIN SL) 0.125 MG SL tablet Place 1 tablet (0.125 mg total) under the tongue every 4 (four) hours as needed. 30 tablet 0  . Insulin Glargine (LANTUS SOLOSTAR) 100 UNIT/ML Solostar Pen Inject 18 Units into the skin daily. Increase 2 units every 3 days until fasting blood sugars approach 100. 5 pen 3  . Insulin Pen Needle (UNIFINE PENTIPS) 31G X 5 MM MISC USE AS DIRECTED 100 each 2  . Lancet Device MISC by Does not apply route. Use to test  blood sugar three times daily     . liraglutide (VICTOZA) 18 MG/3ML SOPN Inject 0.3 mLs (1.8 mg total) into the skin daily. 6 mL 6  . meclizine (ANTIVERT) 25 MG tablet Take 1 tablet (25 mg total) by mouth 3 (three) times daily as needed for dizziness. 30 tablet 0  . metFORMIN (GLUCOPHAGE-XR) 500 MG 24 hr tablet TAKE 2 TABLETS BY MOUTH TWICE DAILY AND 1 TABLET AT NOON 150 tablet 5  . nitroGLYCERIN (NITROSTAT) 0.4 MG SL tablet Place 1  tablet (0.4 mg total) under the tongue every 5 (five) minutes x 3 doses as needed for chest pain. 40 tablet 1  . pantoprazole (PROTONIX) 40 MG tablet Take 1 tablet (40 mg total) by mouth daily. 30 tablet 5  . potassium chloride SA (K-DUR,KLOR-CON) 20 MEQ tablet Take 1 tablet (20 mEq total) by mouth daily. 30 tablet 3  . promethazine (PHENERGAN) 25 MG tablet Take 1 tablet (25 mg total) by mouth every 8 (eight) hours as needed for nausea or vomiting. 20 tablet 0  . sitaGLIPtin (JANUVIA) 100 MG tablet Take 1 tablet (100 mg total) by mouth daily. 30 tablet 6  . tiZANidine (ZANAFLEX) 4 MG tablet Take 1 tablet (4 mg total) by mouth every 6 (six) hours as needed for muscle spasms. 40 tablet 1  . traMADol (ULTRAM) 50 MG tablet TAKE 2 TABLETS BY MOUTH TWICE DAILY AS NEEDED 60 tablet 0  . cetirizine (ZYRTEC) 10 MG tablet Take 1 tablet (10 mg total) by mouth 2 (two) times daily. 60 tablet 3  . famotidine (PEPCID) 20 MG tablet Take 1 tablet (20 mg total) by mouth 2 (two) times daily. 60 tablet 2   No facility-administered medications prior to visit.     Allergies  Allergen Reactions  . Wilder Glade [Dapagliflozin] Hives    Review of Systems  Constitutional: Negative for fever and malaise/fatigue.  HENT: Negative for congestion.   Eyes: Negative for blurred vision.  Respiratory: Negative for shortness of breath.   Cardiovascular: Negative for chest pain, palpitations and leg swelling.  Gastrointestinal: Negative for abdominal pain, blood in stool and nausea.  Genitourinary:  Negative for dysuria and frequency.  Musculoskeletal: Negative for falls.  Skin: Negative for rash.  Neurological: Negative for dizziness, loss of consciousness and headaches.  Endo/Heme/Allergies: Negative for environmental allergies.  Psychiatric/Behavioral: Negative for depression. The patient is not nervous/anxious.        Objective:    Physical Exam Constitutional:      Appearance: Normal appearance. She is not ill-appearing.  HENT:     Head: Normocephalic and atraumatic.     Nose: Nose normal.  Pulmonary:     Effort: Pulmonary effort is normal.  Neurological:     Mental Status: She is alert and oriented to person, place, and time.  Psychiatric:        Mood and Affect: Mood normal.        Behavior: Behavior normal.     There were no vitals taken for this visit. Wt Readings from Last 3 Encounters:  09/27/18 235 lb 6.4 oz (106.8 kg)  08/20/18 241 lb 3.2 oz (109.4 kg)  07/19/18 236 lb 3.2 oz (107.1 kg)    Diabetic Foot Exam - Simple   No data filed     Lab Results  Component Value Date   WBC 10.5 09/27/2018   HGB 13.9 09/27/2018   HCT 42.3 09/27/2018   PLT 440.0 (H) 09/27/2018   GLUCOSE 143 (H) 09/27/2018   CHOL 168 09/27/2018   TRIG 143.0 09/27/2018   HDL 35.00 (L) 09/27/2018   LDLDIRECT 100.0 08/03/2017   LDLCALC 105 (H) 09/27/2018   ALT 10 09/27/2018   AST 10 09/27/2018   NA 141 09/27/2018   K 4.1 09/27/2018   CL 102 09/27/2018   CREATININE 0.66 09/27/2018   BUN 13 09/27/2018   CO2 30 09/27/2018   TSH 1.07 09/27/2018   HGBA1C 10.4 (H) 09/27/2018   MICROALBUR 0.9 11/12/2015    Lab Results  Component Value Date  TSH 1.07 09/27/2018   Lab Results  Component Value Date   WBC 10.5 09/27/2018   HGB 13.9 09/27/2018   HCT 42.3 09/27/2018   MCV 91.7 09/27/2018   PLT 440.0 (H) 09/27/2018   Lab Results  Component Value Date   NA 141 09/27/2018   K 4.1 09/27/2018   CO2 30 09/27/2018   GLUCOSE 143 (H) 09/27/2018   BUN 13 09/27/2018    CREATININE 0.66 09/27/2018   BILITOT 0.4 09/27/2018   ALKPHOS 88 09/27/2018   AST 10 09/27/2018   ALT 10 09/27/2018   PROT 6.8 09/27/2018   ALBUMIN 4.0 09/27/2018   CALCIUM 9.9 09/27/2018   GFR 119.18 09/27/2018   Lab Results  Component Value Date   CHOL 168 09/27/2018   Lab Results  Component Value Date   HDL 35.00 (L) 09/27/2018   Lab Results  Component Value Date   LDLCALC 105 (H) 09/27/2018   Lab Results  Component Value Date   TRIG 143.0 09/27/2018   Lab Results  Component Value Date   CHOLHDL 5 09/27/2018   Lab Results  Component Value Date   HGBA1C 10.4 (H) 09/27/2018       Assessment & Plan:   Problem List Items Addressed This Visit    Diabetes mellitus type 2 in obese (HCC)     minimize simple carbs. Increase exercise as tolerated. Continue current meds      Hyperlipidemia, mixed    Encouraged heart healthy diet, increase exercise, avoid trans fats, consider a krill oil cap daily      Essential hypertension    no changes to meds. Encouraged heart healthy diet such as the DASH diet and exercise as tolerated.       GERD (gastroesophageal reflux disease)    Avoid offending foods, start probiotics. Do not eat large meals in late evening and consider raising head of bed. Famotidine 20 mg bid.       Relevant Medications   famotidine (PEPCID) 20 MG tablet   Hand pain, left    Encouraged ice May try NSAIDs and prescription meds as directed and report if symptoms worsen or seek immediate care      Hives    Zyrtec and Famotidine bid. Consider steroids if no improvement.        Other Visit Diagnoses    Right foot pain    -  Primary   Relevant Orders   Ambulatory referral to Podiatry   Dysphagia, unspecified type       Relevant Orders   Ambulatory referral to Gastroenterology   Chronic pain of left thumb       Relevant Orders   DG Finger Thumb Left      I am having Eileen Richards maintain her Lancet Device, aspirin, meclizine,  promethazine, tiZANidine, glucose blood, Accu-Chek FastClix Lancets, hyoscyamine, carvedilol, sitaGLIPtin, atorvastatin, potassium chloride SA, pantoprazole, glimepiride, albuterol, hydrochlorothiazide, Accu-Chek Guide, nitroGLYCERIN, traMADol, metFORMIN, liraglutide, Insulin Glargine, Insulin Pen Needle, famotidine, and cetirizine.  Meds ordered this encounter  Medications  . famotidine (PEPCID) 20 MG tablet    Sig: Take 1 tablet (20 mg total) by mouth 2 (two) times daily.    Dispense:  180 tablet    Refill:  1  . cetirizine (ZYRTEC) 10 MG tablet    Sig: Take 1 tablet (10 mg total) by mouth 2 (two) times daily.    Dispense:  180 tablet    Refill:  1     I discussed the assessment and treatment plan with the  patient. The patient was provided an opportunity to ask questions and all were answered. The patient agreed with the plan and demonstrated an understanding of the instructions.   The patient was advised to call back or seek an in-person evaluation if the symptoms worsen or if the condition fails to improve as anticipated.  I provided 25 minutes of non-face-to-face time during this encounter.   Penni Homans, MD l

## 2019-02-03 NOTE — Assessment & Plan Note (Signed)
Possible neuroma. Needs to see podiatry.

## 2019-02-03 NOTE — Assessment & Plan Note (Signed)
Zyrtec and Famotidine bid. Consider steroids if no improvement.

## 2019-02-03 NOTE — Assessment & Plan Note (Signed)
Encouraged ice May try NSAIDs and prescription meds as directed and report if symptoms worsen or seek immediate care

## 2019-02-04 MED ORDER — TIZANIDINE HCL 4 MG PO TABS: 4.0000 mg | ORAL_TABLET | Freq: Four times a day (QID) | ORAL | 1 refills | Status: DC | PRN

## 2019-02-04 MED FILL — tiZANidine HCL 4 MG TABS: 4 | 10 days supply | Qty: 40 | Fill #0

## 2019-02-05 ENCOUNTER — Ambulatory Visit (HOSPITAL_BASED_OUTPATIENT_CLINIC_OR_DEPARTMENT_OTHER)
Admission: RE | Admit: 2019-02-05 | Discharge: 2019-02-05 | Disposition: A | Discharge status: Home / Self Care | Payer: 59 | Source: Ambulatory Visit | Attending: Family Medicine | Admitting: Family Medicine

## 2019-02-05 ENCOUNTER — Other Ambulatory Visit: Payer: Self-pay

## 2019-02-05 DIAGNOSIS — S6992XA Unspecified injury of left wrist, hand and finger(s), initial encounter: Secondary | ICD-10-CM | POA: Diagnosis not present

## 2019-02-05 DIAGNOSIS — G8929 Other chronic pain: Secondary | ICD-10-CM | POA: Diagnosis not present

## 2019-02-05 DIAGNOSIS — M79645 Pain in left finger(s): Secondary | ICD-10-CM | POA: Diagnosis not present

## 2019-02-06 ENCOUNTER — Telehealth: Payer: Self-pay | Admitting: Family Medicine

## 2019-02-06 DIAGNOSIS — E782 Mixed hyperlipidemia: Secondary | ICD-10-CM

## 2019-02-06 DIAGNOSIS — E1169 Type 2 diabetes mellitus with other specified complication: Secondary | ICD-10-CM

## 2019-02-06 DIAGNOSIS — E6609 Other obesity due to excess calories: Secondary | ICD-10-CM

## 2019-02-06 DIAGNOSIS — I10 Essential (primary) hypertension: Secondary | ICD-10-CM

## 2019-02-06 NOTE — Telephone Encounter (Signed)
Pt came in office 02-05-2019 stating had a VOV on 01-31-2019 and that provider wanted pt to come in office to have labs done. There is no lab order on file and pt insisted that provider wanted to have her labs done. Please advise. (pt is schedule for 02-12-2019 for lab but needing lab orders to be put in)

## 2019-02-06 NOTE — Telephone Encounter (Signed)
Lab orders placed.  

## 2019-02-06 NOTE — Telephone Encounter (Signed)
Dr Charlett Blake,  I have received her FMLA forms do you know right off what she wants the [facility] for   Please advise

## 2019-02-07 DIAGNOSIS — Z0279 Encounter for issue of other medical certificate: Secondary | ICD-10-CM

## 2019-02-12 ENCOUNTER — Other Ambulatory Visit (INDEPENDENT_AMBULATORY_CARE_PROVIDER_SITE_OTHER): Payer: 59

## 2019-02-12 ENCOUNTER — Other Ambulatory Visit: Payer: Self-pay

## 2019-02-12 DIAGNOSIS — E782 Mixed hyperlipidemia: Secondary | ICD-10-CM

## 2019-02-12 DIAGNOSIS — E1169 Type 2 diabetes mellitus with other specified complication: Secondary | ICD-10-CM

## 2019-02-12 DIAGNOSIS — E669 Obesity, unspecified: Secondary | ICD-10-CM | POA: Diagnosis not present

## 2019-02-12 DIAGNOSIS — I10 Essential (primary) hypertension: Secondary | ICD-10-CM

## 2019-02-12 DIAGNOSIS — E6609 Other obesity due to excess calories: Secondary | ICD-10-CM | POA: Diagnosis not present

## 2019-02-13 LAB — COMPREHENSIVE METABOLIC PANEL
ALT: 15 U/L (ref 0–35)
AST: 15 U/L (ref 0–37)
Albumin: 4 g/dL (ref 3.5–5.2)
Alkaline Phosphatase: 93 U/L (ref 39–117)
BUN: 11 mg/dL (ref 6–23)
CO2: 29 mEq/L (ref 19–32)
Calcium: 9.8 mg/dL (ref 8.4–10.5)
Chloride: 100 mEq/L (ref 96–112)
Creatinine, Ser: 0.69 mg/dL (ref 0.40–1.20)
GFR: 106.38 mL/min (ref >60.00)
Glucose, Bld: 122 mg/dL — ABNORMAL HIGH (ref 70–99)
Potassium: 4.2 mEq/L (ref 3.5–5.1)
Sodium: 139 mEq/L (ref 135–145)
Total Bilirubin: 0.4 mg/dL (ref 0.2–1.2)
Total Protein: 7.2 g/dL (ref 6.0–8.3)

## 2019-02-13 LAB — TSH: TSH: 2.19 u[IU]/mL (ref 0.35–4.50)

## 2019-02-13 LAB — CBC WITH DIFFERENTIAL/PLATELET
Basophils Absolute: 0.2 10*3/uL — ABNORMAL HIGH (ref 0.0–0.1)
Basophils Relative: 1.6 % (ref 0.0–3.0)
Eosinophils Absolute: 0.2 10*3/uL (ref 0.0–0.7)
Eosinophils Relative: 1.9 % (ref 0.0–5.0)
HCT: 40.3 % (ref 36.0–46.0)
Hemoglobin: 13.5 g/dL (ref 12.0–15.0)
Lymphocytes Relative: 21.2 % (ref 12.0–46.0)
Lymphs Abs: 2.4 10*3/uL (ref 0.7–4.0)
MCHC: 33.6 g/dL (ref 30.0–36.0)
MCV: 90 fl (ref 78.0–100.0)
Monocytes Absolute: 0.5 10*3/uL (ref 0.1–1.0)
Monocytes Relative: 4.5 % (ref 3.0–12.0)
Neutro Abs: 8 10*3/uL — ABNORMAL HIGH (ref 1.4–7.7)
Neutrophils Relative %: 70.8 % (ref 43.0–77.0)
Platelets: 435 10*3/uL — ABNORMAL HIGH (ref 150.0–400.0)
RBC: 4.48 Mil/uL (ref 3.87–5.11)
RDW: 14.1 % (ref 11.5–15.5)
WBC: 11.3 10*3/uL — ABNORMAL HIGH (ref 4.0–10.5)

## 2019-02-13 LAB — LIPID PANEL
Cholesterol: 154 mg/dL (ref 0–200)
HDL: 35.5 mg/dL — ABNORMAL LOW (ref >39.00)
LDL Cholesterol: 92 mg/dL (ref 0–99)
NonHDL: 118.35
Total CHOL/HDL Ratio: 4
Triglycerides: 130 mg/dL (ref 0.0–149.0)
VLDL: 26 mg/dL (ref 0.0–40.0)

## 2019-02-13 LAB — HEMOGLOBIN A1C: Hgb A1c MFr Bld: 8.5 % — ABNORMAL HIGH (ref 4.6–6.5)

## 2019-02-15 MED FILL — SM ACID REDUCER 20 MG TAB: 20 | 25 days supply | Qty: 50 | Fill #2

## 2019-02-18 ENCOUNTER — Telehealth: Payer: Self-pay | Admitting: General Surgery

## 2019-02-18 NOTE — Telephone Encounter (Signed)
The patient returned my call but was unable to verify information in her chart for her virtual visit tomorrow. She was at work, patient stated everything was up to date.

## 2019-02-18 NOTE — Telephone Encounter (Signed)
Left voicemail for the patient to call and pre-screen for virtual visit on 02/19/2019

## 2019-02-18 NOTE — Telephone Encounter (Signed)
Pre screen

## 2019-02-19 ENCOUNTER — Ambulatory Visit (INDEPENDENT_AMBULATORY_CARE_PROVIDER_SITE_OTHER): Payer: 59 | Admitting: Gastroenterology

## 2019-02-19 ENCOUNTER — Encounter: Payer: Self-pay | Admitting: Podiatry

## 2019-02-19 ENCOUNTER — Other Ambulatory Visit: Payer: Self-pay

## 2019-02-19 ENCOUNTER — Ambulatory Visit: Payer: 59 | Admitting: Podiatry

## 2019-02-19 ENCOUNTER — Encounter: Payer: Self-pay | Admitting: Gastroenterology

## 2019-02-19 ENCOUNTER — Ambulatory Visit (INDEPENDENT_AMBULATORY_CARE_PROVIDER_SITE_OTHER): Payer: 59

## 2019-02-19 VITALS — Ht 64.0 in | Wt 235.0 lb

## 2019-02-19 VITALS — Temp 97.3°F

## 2019-02-19 DIAGNOSIS — M778 Other enthesopathies, not elsewhere classified: Secondary | ICD-10-CM

## 2019-02-19 DIAGNOSIS — M779 Enthesopathy, unspecified: Secondary | ICD-10-CM | POA: Diagnosis not present

## 2019-02-19 DIAGNOSIS — B351 Tinea unguium: Secondary | ICD-10-CM

## 2019-02-19 DIAGNOSIS — K219 Gastro-esophageal reflux disease without esophagitis: Secondary | ICD-10-CM | POA: Diagnosis not present

## 2019-02-19 DIAGNOSIS — G5761 Lesion of plantar nerve, right lower limb: Secondary | ICD-10-CM

## 2019-02-19 DIAGNOSIS — M79671 Pain in right foot: Secondary | ICD-10-CM | POA: Diagnosis not present

## 2019-02-19 DIAGNOSIS — R1310 Dysphagia, unspecified: Secondary | ICD-10-CM | POA: Diagnosis not present

## 2019-02-19 MED ORDER — TRIAMCINOLONE ACETONIDE 10 MG/ML IJ SUSP
10.0000 mg | Freq: Once | INTRAMUSCULAR | Status: AC
  Administered 2019-02-19: 10 mg

## 2019-02-19 NOTE — Progress Notes (Signed)
Eileen Richards    [id number]    [birth year]  Primary Care Physician:Blyth, Bonnita Levan, MD  Referring Physician: Mosie Lukes, MD [address] [place], [place] [address]  This service was provided via audio and video telemedicine (Doximity) due to [place] 19 pandemic.  Patient location: Home Provider location: Office Used 2 patient identifiers to confirm the correct person. Explained the limitations in evaluation and management via telemedicine. Patient is aware of potential medical charges for this visit.  Patient consented to this virtual visit.  The persons participating in this telemedicine service were myself and the patient    Chief complaint:  Dysphagia  HPI: 58 year old female with history of diabetes, hypertension, hyperlipidemia with complaints of intermittent dysphagia progressively worse in the past 2 months.  She feels like food sits in her chest and she has to take multiple swallows or sips of water to get it down.  She has most difficulty with solid food and is worse with meat especially beef.  Initially she was having episodes every few days but now she has an episode almost daily.  Symptoms are somewhat better since she started taking Protonix but not completely resolved.  Denies any decreased appetite or loss of weight.  No family history of gastric cancer or GI malignancy.  She had colonoscopy by Dr. Collene Mares in 2019, will obtain the report from her office  Outpatient Encounter Medications as of 02/19/2019  Medication Sig  . ACCU-CHEK FASTCLIX LANCETS MISC Use as directed twice daily to check blood sugar.  DX E11.9  . albuterol (PROVENTIL HFA;VENTOLIN HFA) 108 (90 Base) MCG/ACT inhaler Inhale 2 puffs into the lungs every 6 (six) hours as needed for wheezing or shortness of breath.  Marland Kitchen aspirin 81 MG tablet Take 81 mg by mouth daily.  Marland Kitchen atorvastatin (LIPITOR) 20 MG tablet Take 1 tablet (20 mg total) by mouth daily.  . Blood Glucose Monitoring  Suppl (ACCU-CHEK GUIDE) w/Device KIT Inject 1 kit as directed 2 (two) times daily. Use to check blood sugar.  DX E11.9  . carvedilol (COREG) 3.125 MG tablet Take 1 tablet (3.125 mg total) by mouth 2 (two) times daily with a meal.  . cetirizine (ZYRTEC) 10 MG tablet Take 1 tablet (10 mg total) by mouth 2 (two) times daily.  . famotidine (PEPCID) 20 MG tablet Take 1 tablet (20 mg total) by mouth 2 (two) times daily.  Marland Kitchen glimepiride (AMARYL) 4 MG tablet Take 1 tablet (4 mg total) by mouth 2 (two) times daily.  Marland Kitchen glucose blood (ACCU-CHEK GUIDE) test strip Use as directed twice daily to check blood sugar.  DX E11.9  . hydrochlorothiazide (HYDRODIURIL) 25 MG tablet Take 1 tablet (25 mg total) by mouth daily.  . hyoscyamine (LEVSIN SL) 0.125 MG SL tablet Place 1 tablet (0.125 mg total) under the tongue every 4 (four) hours as needed.  . Insulin Glargine (LANTUS SOLOSTAR) 100 UNIT/ML Solostar Pen Inject 18 Units into the skin daily. Increase 2 units every 3 days until fasting blood sugars approach 100.  . Insulin Pen Needle (UNIFINE PENTIPS) 31G X 5 MM MISC USE AS DIRECTED  . Lancet Device MISC by Does not apply route. Use to test blood sugar three times daily   . liraglutide (VICTOZA) 18 MG/3ML SOPN Inject 0.3 mLs (1.8 mg total) into the skin daily.  . meclizine (ANTIVERT) 25 MG tablet Take 1 tablet (25 mg total) by mouth 3 (three) times daily as  needed for dizziness.  . metFORMIN (GLUCOPHAGE-XR) 500 MG 24 hr tablet TAKE 2 TABLETS BY MOUTH TWICE DAILY AND 1 TABLET AT NOON  . nitroGLYCERIN (NITROSTAT) 0.4 MG SL tablet Place 1 tablet (0.4 mg total) under the tongue every 5 (five) minutes x 3 doses as needed for chest pain.  . pantoprazole (PROTONIX) 40 MG tablet Take 1 tablet (40 mg total) by mouth daily.  . potassium chloride SA (K-DUR,KLOR-CON) 20 MEQ tablet Take 1 tablet (20 mEq total) by mouth daily.  . promethazine (PHENERGAN) 25 MG tablet Take 1 tablet (25 mg total) by mouth every 8 (eight) hours as  needed for nausea or vomiting.  . sitaGLIPtin (JANUVIA) 100 MG tablet Take 1 tablet (100 mg total) by mouth daily.  Marland Kitchen tiZANidine (ZANAFLEX) 4 MG tablet Take 1 tablet (4 mg total) by mouth every 6 (six) hours as needed for muscle spasms.  . traMADol (ULTRAM) 50 MG tablet TAKE 2 TABLETS BY MOUTH TWICE DAILY AS NEEDED   No facility-administered encounter medications on file as of 02/19/2019.     Allergies as of 02/19/2019 - Review Complete 02/19/2019  Allergen Reaction Noted  . Wilder Glade [dapagliflozin] Hives 07/24/2018    Past Medical History:  Diagnosis Date  . Achilles tendonitis 10/14/2012  . Asthma    childhood  . Atypical chest pain    normal stress echo 02/2009 -LV size was normal LV global systolic function was normal- Normal wall motion; no LV regional wall motion abnormalities  . Benign paroxysmal positional vertigo 04/26/2015  . Chest pain, atypical 08/24/2013  . Diabetes mellitus   . GERD (gastroesophageal reflux disease)   . Hand pain, left 11/10/2015  . Hyperlipemia   . Hypertension   . Neuropathic pain of ankle, left 04/27/2017  . Onychomycosis 04/17/2014  . Pain of right heel 10/14/2012  . Preventative health care 11/22/2015  . Sinusitis, acute 01/17/2016  . Thoracic outlet syndrome 11/03/2014  . Urinary urgency 12/15/2015    Past Surgical History:  Procedure Laterality Date  . ABDOMINAL HYSTERECTOMY    . CYSTOSCOPY  07/23/2012   Procedure: CYSTOSCOPY;  Surgeon: Marvene Staff, MD;  Location: [facility];  Service: Gynecology;  Laterality: N/A;  . DILATION AND CURETTAGE OF UTERUS     Lysis of Adhesions  . ROBOTIC ASSISTED LAPAROSCOPIC LYSIS OF ADHESION  07/23/2012   Procedure: ROBOTIC ASSISTED LAPAROSCOPIC LYSIS OF ADHESION;  Surgeon: Marvene Staff, MD;  Location: [facility];  Service: Gynecology;  Laterality: N/A;  . ROBOTIC ASSISTED SALPINGO OOPHERECTOMY  07/23/2012   Procedure: ROBOTIC ASSISTED SALPINGO OOPHERECTOMY;  Surgeon: Marvene Staff, MD;  Location: [facility];   Service: Gynecology;  Laterality: Bilateral;    Family History  Problem Relation Age of Onset  . GER disease Mother   . GER disease Father   . Heart disease Father        father has "heart problems"  . Anemia Father   . Asthma Other   . Hypertension Other   . Diabetes Other     Social History   Socioeconomic History  . Marital status: Single    Spouse name: Not on file  . Number of children: Not on file  . Years of education: Not on file  . Highest education level: Not on file  Occupational History  . Not on file  Social Needs  . Financial resource strain: Not on file  . Food insecurity:    Worry: Not on file    Inability: Not on file  .  Transportation needs:    Medical: Not on file    Non-medical: Not on file  Tobacco Use  . Smoking status: Former Research scientist (life sciences)  . Smokeless tobacco: Never Used  Substance and Sexual Activity  . Alcohol use: No  . Drug use: No  . Sexual activity: Not on file  Lifestyle  . Physical activity:    Days per week: Not on file    Minutes per session: Not on file  . Stress: Not on file  Relationships  . Social connections:    Talks on phone: Not on file    Gets together: Not on file    Attends religious service: Not on file    Active member of club or organization: Not on file    Attends meetings of clubs or organizations: Not on file    Relationship status: Not on file  . Intimate partner violence:    Fear of current or ex partner: Not on file    Emotionally abused: Not on file    Physically abused: Not on file    Forced sexual activity: Not on file  Other Topics Concern  . Not on file  Social History Narrative  . Not on file      Review of systems: Review of Systems as per HPI All other systems reviewed and are negative.   Physical Exam: Vitals were not taken and physical exam was not performed during this virtual visit.  Data Reviewed:  Reviewed labs, radiology imaging, old records and pertinent past GI work up    Assessment and Plan/Recommendations:  58 year old female with complaints of solid dysphagia Schedule for EGD for further evaluation, esophageal biopsies and dilation if needed Continue Protonix 40 mg daily, 30 minutes before breakfast Antireflux measures  Obtain report of previous colonoscopy from Dr. Lorie Apley office  The risks and benefits as well as alternatives of endoscopic procedure(s) have been discussed and reviewed. All questions answered. The patient agrees to proceed.     Damaris Hippo , MD   CC: Mosie Lukes, MD

## 2019-02-19 NOTE — Patient Instructions (Addendum)
You have been scheduled for an endoscopy. Please follow written instructions given to you at your visit today. If you use inhalers (even only as needed), please bring them with you on the day of your procedure. Avoid dry meat, dense bread and raw vegetables until EGD  Soft diet, cut food to bite size pieces and chew really well with sips of water in between bites  Continue Protonix 40 mg daily, 30 minutes before breakfast  Antireflux measures  Will obtain pathology report from Dr. Lorie Apley office for polyps removed in 2019.  Based on biopsy results will place recall for colonoscopy   Gastroesophageal Reflux Disease, Adult Gastroesophageal reflux (GER) happens when acid from the stomach flows up into the tube that connects the mouth and the stomach (esophagus). Normally, food travels down the esophagus and stays in the stomach to be digested. However, when a person has GER, food and stomach acid sometimes move back up into the esophagus. If this becomes a more serious problem, the person may be diagnosed with a disease called gastroesophageal reflux disease (GERD). GERD occurs when the reflux:  Happens often.  Causes frequent or severe symptoms.  Causes problems such as damage to the esophagus. When stomach acid comes in contact with the esophagus, the acid may cause soreness (inflammation) in the esophagus. Over time, GERD may create small holes (ulcers) in the lining of the esophagus. What are the causes? This condition is caused by a problem with the muscle between the esophagus and the stomach (lower esophageal sphincter, or LES). Normally, the LES muscle closes after food passes through the esophagus to the stomach. When the LES is weakened or abnormal, it does not close properly, and that allows food and stomach acid to go back up into the esophagus. The LES can be weakened by certain dietary substances, medicines, and medical conditions, including:  Tobacco use.  Pregnancy.  Having  a hiatal hernia.  Alcohol use.  Certain foods and beverages, such as coffee, chocolate, onions, and peppermint. What increases the risk? You are more likely to develop this condition if you:  Have an increased body weight.  Have a connective tissue disorder.  Use NSAID medicines. What are the signs or symptoms? Symptoms of this condition include:  Heartburn.  Difficult or painful swallowing.  The feeling of having a lump in the throat.  Abitter taste in the mouth.  Bad breath.  Having a large amount of saliva.  Having an upset or bloated stomach.  Belching.  Chest pain. Different conditions can cause chest pain. Make sure you see your health care provider if you experience chest pain.  Shortness of breath or wheezing.  Ongoing (chronic) cough or a night-time cough.  Wearing away of tooth enamel.  Weight loss. How is this diagnosed? Your health care provider will take a medical history and perform a physical exam. To determine if you have mild or severe GERD, your health care provider may also monitor how you respond to treatment. You may also have tests, including:  A test to examine your stomach and esophagus with a small camera (endoscopy).  A test thatmeasures the acidity level in your esophagus.  A test thatmeasures how much pressure is on your esophagus.  A barium swallow or modified barium swallow test to show the shape, size, and functioning of your esophagus. How is this treated? The goal of treatment is to help relieve your symptoms and to prevent complications. Treatment for this condition may vary depending on how severe your symptoms  are. Your health care provider may recommend:  Changes to your diet.  Medicine.  Surgery. Follow these instructions at home: Eating and drinking   Follow a diet as recommended by your health care provider. This may involve avoiding foods and drinks such as: ? Coffee and tea (with or without  caffeine). ? Drinks that containalcohol. ? Energy drinks and sports drinks. ? Carbonated drinks or sodas. ? Chocolate and cocoa. ? Peppermint and mint flavorings. ? Garlic and onions. ? Horseradish. ? Spicy and acidic foods, including peppers, chili powder, curry powder, vinegar, hot sauces, and barbecue sauce. ? Citrus fruit juices and citrus fruits, such as oranges, lemons, and limes. ? Tomato-based foods, such as red sauce, chili, salsa, and pizza with red sauce. ? Fried and fatty foods, such as donuts, french fries, potato chips, and high-fat dressings. ? High-fat meats, such as hot dogs and fatty cuts of red and white meats, such as rib eye steak, sausage, ham, and bacon. ? High-fat dairy items, such as whole milk, butter, and cream cheese.  Eat small, frequent meals instead of large meals.  Avoid drinking large amounts of liquid with your meals.  Avoid eating meals during the 2-3 hours before bedtime.  Avoid lying down right after you eat.  Do not exercise right after you eat. Lifestyle   Do not use any products that contain nicotine or tobacco, such as cigarettes, e-cigarettes, and chewing tobacco. If you need help quitting, ask your health care provider.  Try to reduce your stress by using methods such as yoga or meditation. If you need help reducing stress, ask your health care provider.  If you are overweight, reduce your weight to an amount that is healthy for you. Ask your health care provider for guidance about a safe weight loss goal. General instructions  Pay attention to any changes in your symptoms.  Take over-the-counter and prescription medicines only as told by your health care provider. Do not take aspirin, ibuprofen, or other NSAIDs unless your health care provider told you to do so.  Wear loose-fitting clothing. Do not wear anything tight around your waist that causes pressure on your abdomen.  Raise (elevate) the head of your bed about 6 inches (15  cm).  Avoid bending over if this makes your symptoms worse.  Keep all follow-up visits as told by your health care provider. This is important. Contact a health care provider if:  You have: ? New symptoms. ? Unexplained weight loss. ? Difficulty swallowing or it hurts to swallow. ? Wheezing or a persistent cough. ? A hoarse voice.  Your symptoms do not improve with treatment. Get help right away if you:  Have pain in your arms, neck, jaw, teeth, or back.  Feel sweaty, dizzy, or light-headed.  Have chest pain or shortness of breath.  Vomit and your vomit looks like blood or coffee grounds.  Faint.  Have stool that is bloody or black.  Cannot swallow, drink, or eat. Summary  Gastroesophageal reflux happens when acid from the stomach flows up into the esophagus. GERD is a disease in which the reflux happens often, causes frequent or severe symptoms, or causes problems such as damage to the esophagus.  Treatment for this condition may vary depending on how severe your symptoms are. Your health care provider may recommend diet and lifestyle changes, medicine, or surgery.  Contact a health care provider if you have new or worsening symptoms.  Take over-the-counter and prescription medicines only as told by your health care  provider. Do not take aspirin, ibuprofen, or other NSAIDs unless your health care provider told you to do so.  Keep all follow-up visits as told by your health care provider. This is important. This information is not intended to replace advice given to you by your health care provider. Make sure you discuss any questions you have with your health care provider. Document Released: 06/15/2005 Document Revised: 03/14/2018 Document Reviewed: 03/14/2018 Elsevier Interactive Patient Education  2019 [employer] for Gastroesophageal Reflux Disease, Adult When you have gastroesophageal reflux disease (GERD), the foods you eat and your eating  habits are very important. Choosing the right foods can help ease your discomfort. Think about working with a nutrition specialist (dietitian) to help you make good choices. What are tips for following this plan?  Meals  Choose healthy foods that are low in fat, such as fruits, vegetables, whole grains, low-fat dairy products, and lean meat, fish, and poultry.  Eat small meals often instead of 3 large meals a day. Eat your meals slowly, and in a place where you are relaxed. Avoid bending over or lying down until 2-3 hours after eating.  Avoid eating meals 2-3 hours before bed.  Avoid drinking a lot of liquid with meals.  Cook foods using methods other than frying. Bake, grill, or broil food instead.  Avoid or limit: ? Chocolate. ? Peppermint or spearmint. ? Alcohol. ? Pepper. ? Black and decaffeinated coffee. ? Black and decaffeinated tea. ? Bubbly (carbonated) soft drinks. ? Caffeinated energy drinks and soft drinks.  Limit high-fat foods such as: ? Fatty meat or fried foods. ? Whole milk, cream, butter, or ice cream. ? Nuts and nut butters. ? Pastries, donuts, and sweets made with butter or shortening.  Avoid foods that cause symptoms. These foods may be different for everyone. Common foods that cause symptoms include: ? Tomatoes. ? Oranges, lemons, and limes. ? Peppers. ? Spicy food. ? Onions and garlic. ? Vinegar. Lifestyle  Maintain a healthy weight. Ask your doctor what weight is healthy for you. If you need to lose weight, work with your doctor to do so safely.  Exercise for at least 30 minutes for 5 or more days each week, or as told by your doctor.  Wear loose-fitting clothes.  Do not smoke. If you need help quitting, ask your doctor.  Sleep with the head of your bed higher than your feet. Use a wedge under the mattress or blocks under the bed frame to raise the head of the bed. Summary  When you have gastroesophageal reflux disease (GERD), food and  lifestyle choices are very important in easing your symptoms.  Eat small meals often instead of 3 large meals a day. Eat your meals slowly, and in a place where you are relaxed.  Limit high-fat foods such as fatty meat or fried foods.  Avoid bending over or lying down until 2-3 hours after eating.  Avoid peppermint and spearmint, caffeine, alcohol, and chocolate. This information is not intended to replace advice given to you by your health care provider. Make sure you discuss any questions you have with your health care provider. Document Released: 03/06/2012 Document Revised: 10/11/2016 Document Reviewed: 10/11/2016 Elsevier Interactive Patient Education  2019 [employer].     I appreciate the  opportunity to care for you  Thank You   Harl Bowie , MD

## 2019-02-20 ENCOUNTER — Other Ambulatory Visit: Payer: Self-pay | Admitting: Family Medicine

## 2019-02-20 MED FILL — JANUVIA 100 MG TABLET: 100 | 30 days supply | Qty: 30 | Fill #0

## 2019-02-20 MED FILL — CARVEDILOL 3.125 MG TABLET: 3.125 | 30 days supply | Qty: 60 | Fill #1

## 2019-02-20 MED FILL — GLIMEPIRIDE 4 MG TABLET: 4 | 30 days supply | Qty: 60 | Fill #0

## 2019-02-20 MED FILL — VICTOZA 18 MG/3 ML INJECT P: 18 | 30 days supply | Qty: 9 | Fill #2

## 2019-02-28 MED FILL — tiZANidine HCL 4 MG TABS: 4 | 10 days supply | Qty: 40 | Fill #0

## 2019-02-28 MED FILL — HYDROCHLOROTHIAZIDE 25 MG T: 25 | 90 days supply | Qty: 90 | Fill #3

## 2019-02-28 MED FILL — metFORMIN HCL ER 500 MG TB2: 500 | 30 days supply | Qty: 150 | Fill #0

## 2019-02-28 MED FILL — ATORVASTATIN 20 MG TABLET: 20 | 90 days supply | Qty: 90 | Fill #2

## 2019-02-28 MED FILL — POTASSIUM CHLORIDE CRYS ER: 20 | 30 days supply | Qty: 30 | Fill #0

## 2019-02-28 NOTE — Progress Notes (Signed)
Subjective: 58 year old female presents the office today for concerns of pain to the right plantar forefoot and toes which is been ongoing for the last 6 months.  She gets some stinging and burning into the toes has been gradually getting worse.  She is tried no over-the-counter pain patch without any relief.  She states it goes into the second and third toes.  She is also been changing shoes.  No proximal radiating pain.  No weakness.  She is also presenting for follow evaluation of nail fungus.  No pain in the nails no redness or drainage or signs of infection. Denies any systemic complaints such as fevers, chills, nausea, vomiting. No acute changes since last appointment, and no other complaints at this time.   Objective: AAO x3, NAD DP/PT pulses palpable bilaterally, CRT less than 3 seconds There is tenderness palpation of the second interspace of the right foot and a small palpable neuroma identified.  There is no area pinpoint bony tenderness or pain to vibratory sensation.  There is no significant edema, erythema.   Overall nails still continue to be hypertrophic, dystrophic with yellow-brown discoloration.  There is no pain of the nails there is no surrounding redness or drainage or any signs of infection.  No open lesions or pre-ulcerative lesions.  No pain with calf compression, swelling, warmth, erythema  Assessment: Right foot Morton's neuroma, onychomycosis  Plan: -All treatment options discussed with the patient including all alternatives, risks, complications.  -X-rays were obtained and reviewed.  No evidence of acute fracture identified today. -Injection performed today.  See procedure note below.  Dispensed neuroma pads.  Discussed shoe modifications, orthotics. -Order compound cream for onychomycosis -Patient encouraged to call the office with any questions, concerns, change in symptoms.   Procedure: Injection Morton's neuroma Discussed alternatives, risks, complications and  verbal consent was obtained.  Location: Right second interspace Skin Prep: Alcohol Injectate: 0.5cc 0.5% marcaine plain, 0.5 cc 2% lidocaine plain and, 1 cc kenalog 10. Disposition: Patient tolerated procedure well. Injection site dressed with a band-aid.  Post-injection care was discussed and return precautions discussed.    Return in about 4 weeks (around 03/19/2019).  Trula Slade DPM

## 2019-03-04 ENCOUNTER — Telehealth: Payer: Self-pay | Admitting: *Deleted

## 2019-03-04 NOTE — Telephone Encounter (Signed)
Covid-19 screening questions   Do you now or have you had a fever in the last 14 days?no  Do you have any respiratory symptoms of shortness of breath or cough now or in the last 14 days?no  Do you have any family members or close contacts with diagnosed or suspected Covid-19 in the past 14 days?no  Have you been tested for Covid-19 and found to be positive?no  Pt was asked to bring a mask if she has one available if not one will be provided for her and she was told that the care partner can come to the lobby but they must provide their own mask. Sm

## 2019-03-05 ENCOUNTER — Ambulatory Visit (AMBULATORY_SURGERY_CENTER): Payer: 59 | Admitting: Gastroenterology

## 2019-03-05 ENCOUNTER — Encounter: Payer: Self-pay | Admitting: Gastroenterology

## 2019-03-05 ENCOUNTER — Other Ambulatory Visit: Payer: Self-pay

## 2019-03-05 VITALS — BP 148/90 | HR 76 | Temp 98.5°F | Resp 20 | Ht 64.0 in | Wt 235.0 lb

## 2019-03-05 DIAGNOSIS — K2970 Gastritis, unspecified, without bleeding: Secondary | ICD-10-CM | POA: Diagnosis not present

## 2019-03-05 DIAGNOSIS — R1310 Dysphagia, unspecified: Secondary | ICD-10-CM | POA: Diagnosis not present

## 2019-03-05 DIAGNOSIS — K317 Polyp of stomach and duodenum: Secondary | ICD-10-CM | POA: Diagnosis not present

## 2019-03-05 DIAGNOSIS — K2981 Duodenitis with bleeding: Secondary | ICD-10-CM

## 2019-03-05 DIAGNOSIS — K3189 Other diseases of stomach and duodenum: Secondary | ICD-10-CM | POA: Diagnosis not present

## 2019-03-05 MED ORDER — SODIUM CHLORIDE 0.9 % IV SOLN: 500.0000 mL | Freq: Once | INTRAVENOUS | Status: DC

## 2019-03-05 NOTE — Op Note (Signed)
[facility] Patient Name: Eileen Richards Procedure Date: 03/05/2019 2:07 PM MRN: [id number] Endoscopist: Mauri Pole , MD Age: 58 Referring MD:  Date of Birth: 1961/05/20 Gender: Female Account #: [id number] Procedure:                Upper GI endoscopy Indications:              Dysphagia Medicines:                Monitored Anesthesia Care Procedure:                Pre-Anesthesia Assessment:                           - Prior to the procedure, a History and Physical                            was performed, and patient medications and                            allergies were reviewed. The patient's tolerance of                            previous anesthesia was also reviewed. The risks                            and benefits of the procedure and the sedation                            options and risks were discussed with the patient.                            All questions were answered, and informed consent                            was obtained. Prior Anticoagulants: The patient has                            taken no previous anticoagulant or antiplatelet                            agents. ASA Grade Assessment: III - A patient with                            severe systemic disease. After reviewing the risks                            and benefits, the patient was deemed in                            satisfactory condition to undergo the procedure.                           After obtaining informed consent, the endoscope was  passed under direct vision. Throughout the                            procedure, the patient's blood pressure, pulse, and                            oxygen saturations were monitored continuously. The                            Endoscope was introduced through the mouth, and                            advanced to the second part of duodenum. The upper                            GI endoscopy was accomplished  without difficulty.                            The patient tolerated the procedure well. Scope In: Scope Out: Findings:                 The Z-line was regular and was found 37 cm from the                            incisors.                           No endoscopic abnormality was evident in the                            esophagus to explain the patient's complaint of                            dysphagia. It was decided, however, to proceed with                            dilation of the entire esophagus. The scope was                            withdrawn. Dilation was performed with a Maloney                            dilator with no resistance at 34 Fr. The dilation                            site was examined following endoscope reinsertion                            and showed mild mucosal disruption.                           A small hiatal hernia was present.  A single 7 mm sessile polyp was found in the                            cardia. Biopsies were taken with a cold forceps for                            histology.                           Patchy mild inflammation characterized by                            congestion (edema) and erythema was found in the                            entire examined stomach. Biopsies were taken with a                            cold forceps for Helicobacter pylori testing.                           The examined duodenum was normal. Complications:            No immediate complications. Estimated Blood Loss:     Estimated blood loss was minimal. Impression:               - Z-line regular, 37 cm from the incisors.                           - No endoscopic esophageal abnormality to explain                            patient's dysphagia. Esophagus dilated. Dilated.                           - Small hiatal hernia.                           - A single gastric polyp. Biopsied.                           - Gastritis. Biopsied.                            - Normal examined duodenum. Recommendation:           - Patient has a contact number available for                            emergencies. The signs and symptoms of potential                            delayed complications were discussed with the                            patient. Return to normal activities tomorrow.  Written discharge instructions were provided to the                            patient.                           - Resume previous diet.                           - Continue present medications.                           - Await pathology results.                           - Return to GI office (tele visit) in 2 months for                            follow up. Mauri Pole, MD 03/05/2019 2:37:44 PM This report has been signed electronically.

## 2019-03-05 NOTE — Progress Notes (Signed)
Called to room to assist during endoscopic procedure.  Patient ID and intended procedure confirmed with present staff. Received instructions for my participation in the procedure from the performing physician.  

## 2019-03-05 NOTE — Patient Instructions (Signed)
Handouts given for hiatal hernia and post dilation diet.  YOU HAD AN ENDOSCOPIC PROCEDURE TODAY AT [facility]:   Refer to the procedure report that was given to you for any specific questions about what was found during the examination.  If the procedure report does not answer your questions, please call your gastroenterologist to clarify.  If you requested that your care partner not be given the details of your procedure findings, then the procedure report has been included in a sealed envelope for you to review at your convenience later.  YOU SHOULD EXPECT: Some feelings of bloating in the abdomen. Passage of more gas than usual.  Walking can help get rid of the air that was put into your GI tract during the procedure and reduce the bloating. If you had a lower endoscopy (such as a colonoscopy or flexible sigmoidoscopy) you may notice spotting of blood in your stool or on the toilet paper. If you underwent a bowel prep for your procedure, you may not have a normal bowel movement for a few days.  Please Note:  You might notice some irritation and congestion in your nose or some drainage.  This is from the oxygen used during your procedure.  There is no need for concern and it should clear up in a day or so.  SYMPTOMS TO REPORT IMMEDIATELY:   Following upper endoscopy (EGD)  Vomiting of blood or coffee ground material  New chest pain or pain under the shoulder blades  Painful or persistently difficult swallowing  New shortness of breath  Fever of 100F or higher  Black, tarry-looking stools  For urgent or emergent issues, a gastroenterologist can be reached at any hour by calling [phone].   DIET:  SEE POST DILATION DIET HANDOUT.  NPO until 330pm then clear liquids until 430pm.  Soft diet for 24 hours, then you may proceed to your regular diet.  Drink plenty of fluids but you should avoid alcoholic beverages for 24 hours.  ACTIVITY:  You should plan to take it easy for  the rest of today and you should NOT DRIVE or use heavy machinery until tomorrow (because of the sedation medicines used during the test).    FOLLOW UP: Our staff will call the number listed on your records 48-72 hours following your procedure to check on you and address any questions or concerns that you may have regarding the information given to you following your procedure. If we do not reach you, we will leave a message.  We will attempt to reach you two times.  During this call, we will ask if you have developed any symptoms of COVID 19. If you develop any symptoms (ie: fever, flu-like symptoms, shortness of breath, cough etc.) before then, please call [phone].  If you test positive for Covid 19 in the 2 weeks post procedure, please call and report this information to [place].    If any biopsies were taken you will be contacted by phone or by letter within the next 1-3 weeks.  Please call us at [phone] if you have not heard about the biopsies in 3 weeks.    SIGNATURES/CONFIDENTIALITY: You and/or your care partner have signed paperwork which will be entered into your electronic medical record.  These signatures attest to the fact that that the information above on your After Visit Summary has been reviewed and is understood.  Full responsibility of the confidentiality of this discharge information lies with you and/or your care-partner.

## 2019-03-05 NOTE — Progress Notes (Signed)
PT taken to PACU. Monitors in place. VSS. Report given to RN. 

## 2019-03-05 NOTE — Progress Notes (Signed)
Nancy Campbell, LPN- Temp Judy Branson, CMA- [facility] 

## 2019-03-07 ENCOUNTER — Other Ambulatory Visit: Payer: Self-pay | Admitting: Family Medicine

## 2019-03-07 ENCOUNTER — Telehealth: Payer: Self-pay

## 2019-03-07 MED FILL — PANTOPRAZOLE SOD DR 40 MG T: 40 | 30 days supply | Qty: 30 | Fill #0

## 2019-03-07 MED FILL — SM ACID REDUCER 20 MG TAB: 20 | 25 days supply | Qty: 50 | Fill #3

## 2019-03-07 NOTE — Telephone Encounter (Signed)
  Follow up Call-  Call back number 03/05/2019  Post procedure Call Back phone  # [phone]  Permission to leave phone message Yes  Some recent data might be hidden     Patient questions:  Do you have a fever, pain , or abdominal swelling? No. Pain Score  0 *  Have you tolerated food without any problems? Yes.    Have you been able to return to your normal activities? Yes.    Do you have any questions about your discharge instructions: Diet   No. Medications  No. Follow up visit  No.  Do you have questions or concerns about your Care? No.  Actions: * If pain score is 4 or above: No action needed, pain <4.  1. Have you developed a fever since your procedure? no  2.   Have you had an respiratory symptoms (SOB or cough) since your procedure? no  3.   Have you tested positive for COVID 19 since your procedure no  4.   Have you had any family members/close contacts diagnosed with the COVID 19 since your procedure?  no   If yes to any of these questions please route to Joylene John, RN and Alphonsa Gin, Therapist, sports.

## 2019-03-11 MED FILL — LANTUS SOLOSTAR 100 UNITS/M: 100 | 83 days supply | Qty: 15 | Fill #0

## 2019-03-16 MED FILL — VICTOZA 18 MG/3 ML INJECT P: 18 | 30 days supply | Qty: 9 | Fill #0

## 2019-03-19 ENCOUNTER — Other Ambulatory Visit: Payer: Self-pay

## 2019-03-19 ENCOUNTER — Encounter: Payer: Self-pay | Admitting: Podiatry

## 2019-03-19 ENCOUNTER — Ambulatory Visit: Payer: 59 | Admitting: Podiatry

## 2019-03-19 VITALS — Temp 98.1°F

## 2019-03-19 DIAGNOSIS — G5761 Lesion of plantar nerve, right lower limb: Secondary | ICD-10-CM

## 2019-03-19 DIAGNOSIS — B351 Tinea unguium: Secondary | ICD-10-CM

## 2019-03-19 NOTE — Progress Notes (Signed)
Subjective: 58 year old female presents the office today for follow-up evaluation of right foot pain.  She said the injection helped quite a bit she is also got new shoes which is been helpful she is no longer expensing any pain.  No numbness or tingling.  No swelling.  Also she has been using a compound that the nail fungus is been helpful however the left third toenail still thick.  No pain, redness or drainage. Denies any systemic complaints such as fevers, chills, nausea, vomiting. No acute changes since last appointment, and no other complaints at this time.   Objective: AAO x3, NAD DP/PT pulses palpable bilaterally, CRT less than 3 seconds There is no pain on the right side.  Specifically there is no pain the second interspace there is no area pinpoint tenderness.  No edema, erythema.  All other nails are hypertrophic, dystrophic with yellow-brown discoloration.  Color supplements. No open lesions or pre-ulcerative lesions.  No pain with calf compression, swelling, warmth, erythema  Assessment: Resolved right foot pain; onychomycosis  Plan: -All treatment options discussed with the patient including all alternatives, risks, complications.  -Reports doing well.  Monitor for recurrence.  Continue with supportive shoes. -As a courtesy I debrided the nails x10 without any complications or bleeding.  Continue with cream for onychomycosis. -Patient encouraged to call the office with any questions, concerns, change in symptoms.   Trula Slade DPM

## 2019-03-21 ENCOUNTER — Encounter: Payer: Self-pay | Admitting: Gastroenterology

## 2019-03-22 ENCOUNTER — Other Ambulatory Visit: Payer: Self-pay | Admitting: Family Medicine

## 2019-03-22 MED FILL — CARVEDILOL 3.125 MG TABLET: 3.125 | 90 days supply | Qty: 180 | Fill #0

## 2019-03-22 MED FILL — JANUVIA 100 MG TABLET: 100 | 90 days supply | Qty: 90 | Fill #0

## 2019-03-25 MED FILL — GLIMEPIRIDE 4 MG TABLET: 4 | 30 days supply | Qty: 60 | Fill #0

## 2019-04-12 MED FILL — PANTOPRAZOLE SOD DR 40 MG T: 40 | 30 days supply | Qty: 30 | Fill #1

## 2019-04-16 MED FILL — SM ACID REDUCER 20 MG TAB: 20 | 25 days supply | Qty: 50 | Fill #0

## 2019-04-22 MED FILL — metFORMIN HCL ER 500 MG TB2: 500 | 30 days supply | Qty: 150 | Fill #1

## 2019-04-25 DIAGNOSIS — Z1231 Encounter for screening mammogram for malignant neoplasm of breast: Secondary | ICD-10-CM | POA: Diagnosis not present

## 2019-04-25 LAB — HM MAMMOGRAPHY

## 2019-04-26 ENCOUNTER — Other Ambulatory Visit: Payer: Self-pay | Admitting: Family Medicine

## 2019-04-26 MED FILL — VICTOZA 18 MG/3 ML INJECT P: 18 | 30 days supply | Qty: 9 | Fill #0

## 2019-05-03 MED FILL — POTASSIUM CHLORIDE CRYS ER: 20 | 30 days supply | Qty: 30 | Fill #1

## 2019-05-10 ENCOUNTER — Other Ambulatory Visit: Payer: Self-pay | Admitting: Family Medicine

## 2019-05-10 MED FILL — PANTOPRAZOLE SOD DR 40 MG T: 40 | 30 days supply | Qty: 30 | Fill #2

## 2019-05-10 MED FILL — GLIMEPIRIDE 4 MG TABLET: 4 | 90 days supply | Qty: 180 | Fill #0

## 2019-05-22 MED FILL — VICTOZA 18 MG/3 ML INJECT P: 18 | 30 days supply | Qty: 9 | Fill #1

## 2019-05-24 ENCOUNTER — Telehealth: Payer: Self-pay

## 2019-05-24 MED FILL — LANTUS SOLOSTAR 100 UNITS/M: 100 | 83 days supply | Qty: 15 | Fill #1

## 2019-05-24 NOTE — Telephone Encounter (Signed)
Called the patient to follow up on her dysphagia symptoms. She reports she is no longer has any issues with swallowing. She is encouraged to call if she has any GI issues or return of the dysphagia. The patient thanks me for my call.

## 2019-06-03 MED FILL — SM ACID REDUCER 20 MG TAB: 20 | 25 days supply | Qty: 50 | Fill #1

## 2019-06-03 MED FILL — metFORMIN HCL ER 500 MG TB2: 500 | 30 days supply | Qty: 150 | Fill #2

## 2019-07-01 ENCOUNTER — Other Ambulatory Visit: Payer: Self-pay | Admitting: Family Medicine

## 2019-07-01 MED FILL — PANTOPRAZOLE SOD DR 40 MG T: 40 | 30 days supply | Qty: 30 | Fill #3

## 2019-07-01 MED FILL — VICTOZA 18 MG/3 ML INJECT P: 18 | 30 days supply | Qty: 9 | Fill #2

## 2019-07-01 MED FILL — SM ACID REDUCER 20 MG TAB: 20 | 25 days supply | Qty: 50 | Fill #2

## 2019-07-02 ENCOUNTER — Telehealth: Payer: Self-pay

## 2019-07-02 MED ORDER — METFORMIN HCL ER 500 MG PO TB24: ORAL_TABLET | ORAL | 5 refills | Status: DC

## 2019-07-02 MED FILL — POTASSIUM CHLORIDE CRYS ER: 20 | 30 days supply | Qty: 30 | Fill #0

## 2019-07-02 MED FILL — metFORMIN HCL ER 500 MG TB2: 500 | 30 days supply | Qty: 150 | Fill #0

## 2019-07-02 MED FILL — CARVEDILOL 3.125 MG TABLET: 3.125 | 30 days supply | Qty: 60 | Fill #0

## 2019-07-02 MED FILL — HYDROCHLOROTHIAZIDE 25 MG T: 25 | 30 days supply | Qty: 30 | Fill #0

## 2019-07-02 MED FILL — JANUVIA 100 MG TABLET: 100 | 30 days supply | Qty: 30 | Fill #0

## 2019-07-02 NOTE — Addendum Note (Signed)
Addended by: Magdalene Molly A on: 07/02/2019 11:00 AM   Modules accepted: Orders

## 2019-07-02 NOTE — Telephone Encounter (Signed)
Fixed medication prescription and sent in to pharmacy

## 2019-07-02 NOTE — Telephone Encounter (Signed)
Copied from [place] [phone]. Topic: General - Other >> Jul 02, 2019  9:05 AM Pauline Good wrote: Reason for CRM: pharmacy calling and have a question concerning the prescription for metformin's quantity

## 2019-07-09 ENCOUNTER — Ambulatory Visit (INDEPENDENT_AMBULATORY_CARE_PROVIDER_SITE_OTHER): Payer: 59 | Admitting: Family Medicine

## 2019-07-09 ENCOUNTER — Other Ambulatory Visit: Payer: Self-pay

## 2019-07-09 VITALS — BP 140/82 | Wt 247.0 lb

## 2019-07-09 DIAGNOSIS — E669 Obesity, unspecified: Secondary | ICD-10-CM

## 2019-07-09 DIAGNOSIS — E782 Mixed hyperlipidemia: Secondary | ICD-10-CM | POA: Diagnosis not present

## 2019-07-09 DIAGNOSIS — I10 Essential (primary) hypertension: Secondary | ICD-10-CM | POA: Diagnosis not present

## 2019-07-09 DIAGNOSIS — E1169 Type 2 diabetes mellitus with other specified complication: Secondary | ICD-10-CM | POA: Diagnosis not present

## 2019-07-09 DIAGNOSIS — K219 Gastro-esophageal reflux disease without esophagitis: Secondary | ICD-10-CM | POA: Diagnosis not present

## 2019-07-10 NOTE — Assessment & Plan Note (Addendum)
hgba1c unacceptable but her numbers have been improving. minimize simple carbs. Increase exercise as tolerated. Continue current meds

## 2019-07-10 NOTE — Progress Notes (Signed)
Virtual Visit via Video Note  I connected with Eileen Richards on 07/09/19 at  2:40 PM EDT by a video enabled telemedicine application and verified that I am speaking with the correct person using two identifiers.  Location: Patient: home Provider: office   I discussed the limitations of evaluation and management by telemedicine and the availability of in person appointments. The patient expressed understanding and agreed to proceed. Magdalene Molly, CMA was able to get the patient set up on a video visit.    Subjective:    Patient ID: Eileen Richards, female    DOB: 1961-06-26, 58 y.o.   MRN: [id number]  No chief complaint on file.   HPI Patient is in today for follow up on chronic medical concerns including hyperlipidemia, hypertension, diabetes and more. No recent febrile illness or hospitalizations. She is noting ongoing fatigue, sugars have been improved some. No polyuria or polydipsia. No acute concerns. Denies CP/palp//HA/congestion/fevers/GI or GU c/o. Taking meds as prescribed  Past Medical History:  Diagnosis Date  . Achilles tendonitis 10/14/2012  . Asthma    childhood  . Atypical chest pain    normal stress echo 02/2009 -LV size was normal LV global systolic function was normal- Normal wall motion; no LV regional wall motion abnormalities  . Benign paroxysmal positional vertigo 04/26/2015  . Chest pain, atypical 08/24/2013  . Diabetes mellitus   . GERD (gastroesophageal reflux disease)   . Hand pain, left 11/10/2015  . Hyperlipemia   . Hypertension   . Neuropathic pain of ankle, left 04/27/2017  . Onychomycosis 04/17/2014  . Pain of right heel 10/14/2012  . Preventative health care 11/22/2015  . Sinusitis, acute 01/17/2016  . Thoracic outlet syndrome 11/03/2014  . Urinary urgency 12/15/2015    Past Surgical History:  Procedure Laterality Date  . ABDOMINAL HYSTERECTOMY    . CYSTOSCOPY  07/23/2012   Procedure: CYSTOSCOPY;  Surgeon: Marvene Staff, MD;  Location: [facility];  Service: Gynecology;  Laterality: N/A;  . DILATION AND CURETTAGE OF UTERUS     Lysis of Adhesions  . ROBOTIC ASSISTED LAPAROSCOPIC LYSIS OF ADHESION  07/23/2012   Procedure: ROBOTIC ASSISTED LAPAROSCOPIC LYSIS OF ADHESION;  Surgeon: Marvene Staff, MD;  Location: [facility];  Service: Gynecology;  Laterality: N/A;  . ROBOTIC ASSISTED SALPINGO OOPHERECTOMY  07/23/2012   Procedure: ROBOTIC ASSISTED SALPINGO OOPHERECTOMY;  Surgeon: Marvene Staff, MD;  Location: [facility];  Service: Gynecology;  Laterality: Bilateral;    Family History  Problem Relation Age of Onset  . GER disease Mother   . GER disease Father   . Heart disease Father        father has "heart problems"  . Anemia Father   . Asthma Other   . Hypertension Other   . Diabetes Other     Social History   Socioeconomic History  . Marital status: Single    Spouse name: Not on file  . Number of children: Not on file  . Years of education: Not on file  . Highest education level: Not on file  Occupational History  . Not on file  Social Needs  . Financial resource strain: Not on file  . Food insecurity    Worry: Not on file    Inability: Not on file  . Transportation needs    Medical: Not on file    Non-medical: Not on file  Tobacco Use  . Smoking status: Former Research scientist (life sciences)  . Smokeless tobacco: Never Used  Substance and Sexual Activity  .  Alcohol use: No  . Drug use: No  . Sexual activity: Not on file  Lifestyle  . Physical activity    Days per week: Not on file    Minutes per session: Not on file  . Stress: Not on file  Relationships  . Social Herbalist on phone: Not on file    Gets together: Not on file    Attends religious service: Not on file    Active member of club or organization: Not on file    Attends meetings of clubs or organizations: Not on file    Relationship status: Not on file  . Intimate partner violence    Fear of current or ex partner: Not on file    Emotionally abused:  Not on file    Physically abused: Not on file    Forced sexual activity: Not on file  Other Topics Concern  . Not on file  Social History Narrative  . Not on file    Outpatient Medications Prior to Visit  Medication Sig Dispense Refill  . ACCU-CHEK FASTCLIX LANCETS MISC Use as directed twice daily to check blood sugar.  DX E11.9 102 each 6  . albuterol (PROVENTIL HFA;VENTOLIN HFA) 108 (90 Base) MCG/ACT inhaler Inhale 2 puffs into the lungs every 6 (six) hours as needed for wheezing or shortness of breath. 1 Inhaler 6  . aspirin 81 MG tablet Take 81 mg by mouth daily.    Marland Kitchen atorvastatin (LIPITOR) 20 MG tablet Take 1 tablet (20 mg total) by mouth daily. 90 tablet 3  . Blood Glucose Monitoring Suppl (ACCU-CHEK GUIDE) w/Device KIT Inject 1 kit as directed 2 (two) times daily. Use to check blood sugar.  DX E11.9 1 kit 0  . carvedilol (COREG) 3.125 MG tablet TAKE 1 TABLET BY MOUTH 2 TIMES DAILY WITH A MEAL. 60 tablet 0  . cetirizine (ZYRTEC) 10 MG tablet Take 1 tablet (10 mg total) by mouth 2 (two) times daily. 180 tablet 1  . famotidine (PEPCID) 20 MG tablet Take 1 tablet (20 mg total) by mouth 2 (two) times daily. 180 tablet 1  . glimepiride (AMARYL) 4 MG tablet TAKE 1 TABLET (4 MG TOTAL) BY MOUTH 2 TIMES DAILY. 180 tablet 0  . glucose blood (ACCU-CHEK GUIDE) test strip Use as directed twice daily to check blood sugar.  DX E11.9 100 each 6  . hydrochlorothiazide (HYDRODIURIL) 25 MG tablet TAKE 1 TABLET (25 MG TOTAL) BY MOUTH DAILY. 30 tablet 0  . hyoscyamine (LEVSIN SL) 0.125 MG SL tablet Place 1 tablet (0.125 mg total) under the tongue every 4 (four) hours as needed. 30 tablet 0  . Insulin Glargine (LANTUS SOLOSTAR) 100 UNIT/ML Solostar Pen Inject 18 Units into the skin daily. Increase 2 units every 3 days until fasting blood sugars approach 100. 5 pen 3  . Insulin Pen Needle (UNIFINE PENTIPS) 31G X 5 MM MISC USE AS DIRECTED 100 each 2  . JANUVIA 100 MG tablet TAKE 1 TABLET BY MOUTH DAILY. 30  tablet 0  . Lancet Device MISC by Does not apply route. Use to test blood sugar three times daily     . meclizine (ANTIVERT) 25 MG tablet Take 1 tablet (25 mg total) by mouth 3 (three) times daily as needed for dizziness. 30 tablet 0  . metFORMIN (GLUCOPHAGE-XR) 500 MG 24 hr tablet TAKE 2 TABLETS BY MOUTH TWICE DAILY AND 1 TABLET AT NOON 150 tablet 5  . naproxen (NAPROSYN) 500 MG tablet     .  nitroGLYCERIN (NITROSTAT) 0.4 MG SL tablet Place 1 tablet (0.4 mg total) under the tongue every 5 (five) minutes x 3 doses as needed for chest pain. 40 tablet 1  . NON FORMULARY [employer]  Anti-fungal (nail)-#1    . pantoprazole (PROTONIX) 40 MG tablet TAKE 1 TABLET (40 MG TOTAL) BY MOUTH DAILY. 30 tablet 5  . potassium chloride SA (KLOR-CON) 20 MEQ tablet TAKE 1 TABLET (20 MEQ TOTAL) BY MOUTH DAILY. 30 tablet 0  . promethazine (PHENERGAN) 25 MG tablet Take 1 tablet (25 mg total) by mouth every 8 (eight) hours as needed for nausea or vomiting. 20 tablet 0  . tiZANidine (ZANAFLEX) 4 MG tablet Take 1 tablet (4 mg total) by mouth every 6 (six) hours as needed for muscle spasms. 40 tablet 1  . traMADol (ULTRAM) 50 MG tablet TAKE 2 TABLETS BY MOUTH TWICE DAILY AS NEEDED 60 tablet 0  . VICTOZA 18 MG/3ML SOPN INJECT 0.3 MLS (1.8 MG TOTAL) INTO THE SKIN DAILY. 9 mL 3   No facility-administered medications prior to visit.     Allergies  Allergen Reactions  . Wilder Glade [Dapagliflozin] Hives    Review of Systems  Constitutional: Positive for malaise/fatigue. Negative for fever.  HENT: Negative for congestion.   Eyes: Negative for blurred vision.  Respiratory: Positive for shortness of breath.   Cardiovascular: Positive for leg swelling. Negative for chest pain and palpitations.  Gastrointestinal: Negative for abdominal pain, blood in stool and nausea.  Genitourinary: Negative for dysuria and frequency.  Musculoskeletal: Negative for falls.  Skin: Negative for rash.  Neurological: Negative for  dizziness, loss of consciousness and headaches.  Endo/Heme/Allergies: Negative for environmental allergies.  Psychiatric/Behavioral: Negative for depression. The patient is not nervous/anxious.        Objective:    Physical Exam Constitutional:      Appearance: Normal appearance. She is not ill-appearing.  HENT:     Head: Normocephalic and atraumatic.     Right Ear: External ear normal.     Left Ear: External ear normal.     Nose: Nose normal.  Eyes:     General:        Right eye: No discharge.        Left eye: No discharge.  Pulmonary:     Effort: Pulmonary effort is normal.  Neurological:     Mental Status: She is alert and oriented to person, place, and time.  Psychiatric:        Mood and Affect: Mood normal.        Behavior: Behavior normal.     BP 140/82   Wt 247 lb (112 kg)   BMI 42.40 kg/m  Wt Readings from Last 3 Encounters:  07/09/19 247 lb (112 kg)  03/05/19 235 lb (106.6 kg)  02/19/19 235 lb (106.6 kg)    Diabetic Foot Exam - Simple   No data filed     Lab Results  Component Value Date   WBC 11.3 (H) 02/12/2019   HGB 13.5 02/12/2019   HCT 40.3 02/12/2019   PLT 435.0 (H) 02/12/2019   GLUCOSE 122 (H) 02/12/2019   CHOL 154 02/12/2019   TRIG 130.0 02/12/2019   HDL 35.50 (L) 02/12/2019   LDLDIRECT 100.0 08/03/2017   LDLCALC 92 02/12/2019   ALT 15 02/12/2019   AST 15 02/12/2019   NA 139 02/12/2019   K 4.2 02/12/2019   CL 100 02/12/2019   CREATININE 0.69 02/12/2019   BUN 11 02/12/2019   CO2 29 02/12/2019  TSH 2.19 02/12/2019   HGBA1C 8.5 (H) 02/12/2019   MICROALBUR 0.9 11/12/2015    Lab Results  Component Value Date   TSH 2.19 02/12/2019   Lab Results  Component Value Date   WBC 11.3 (H) 02/12/2019   HGB 13.5 02/12/2019   HCT 40.3 02/12/2019   MCV 90.0 02/12/2019   PLT 435.0 (H) 02/12/2019   Lab Results  Component Value Date   NA 139 02/12/2019   K 4.2 02/12/2019   CO2 29 02/12/2019   GLUCOSE 122 (H) 02/12/2019   BUN 11  02/12/2019   CREATININE 0.69 02/12/2019   BILITOT 0.4 02/12/2019   ALKPHOS 93 02/12/2019   AST 15 02/12/2019   ALT 15 02/12/2019   PROT 7.2 02/12/2019   ALBUMIN 4.0 02/12/2019   CALCIUM 9.8 02/12/2019   GFR 106.38 02/12/2019   Lab Results  Component Value Date   CHOL 154 02/12/2019   Lab Results  Component Value Date   HDL 35.50 (L) 02/12/2019   Lab Results  Component Value Date   LDLCALC 92 02/12/2019   Lab Results  Component Value Date   TRIG 130.0 02/12/2019   Lab Results  Component Value Date   CHOLHDL 4 02/12/2019   Lab Results  Component Value Date   HGBA1C 8.5 (H) 02/12/2019       Assessment & Plan:   Problem List Items Addressed This Visit    Diabetes mellitus type 2 in obese ([place])    hgba1c unacceptable but her numbers have been improving. minimize simple carbs. Increase exercise as tolerated. Continue current meds      Relevant Orders   Hemoglobin A1c   Hyperlipidemia, mixed    Tolerating statin, encouraged heart healthy diet, avoid trans fats, minimize simple carbs and saturated fats. Increase exercise as tolerated      Relevant Orders   Lipid panel   Essential hypertension - Primary    Monitor and report concerning numbers, no changes to meds. Encouraged heart healthy diet such as the DASH diet and exercise as tolerated.       Relevant Orders   CBC   Comprehensive metabolic panel   TSH   GERD (gastroesophageal reflux disease)    Avoid offending foods, start probiotics. Do not eat large meals in late evening and consider raising head of bed.          I am having Eileen Richards maintain her Lancet Device, aspirin, meclizine, promethazine, glucose blood, Accu-Chek FastClix Lancets, hyoscyamine, atorvastatin, albuterol, Accu-Chek Guide, nitroGLYCERIN, traMADol, Insulin Glargine, Insulin Pen Needle, famotidine, cetirizine, tiZANidine, naproxen, NON FORMULARY, pantoprazole, Victoza, glimepiride, carvedilol, hydrochlorothiazide, potassium  chloride SA, Januvia, and metFORMIN.  No orders of the defined types were placed in this encounter.    I discussed the assessment and treatment plan with the patient. The patient was provided an opportunity to ask questions and all were answered. The patient agreed with the plan and demonstrated an understanding of the instructions.   The patient was advised to call back or seek an in-person evaluation if the symptoms worsen or if the condition fails to improve as anticipated.  I provided 25 minutes of non-face-to-face time during this encounter.   Penni Homans, MD

## 2019-07-10 NOTE — Assessment & Plan Note (Signed)
Monitor and report concerning numbers, no changes to meds. Encouraged heart healthy diet such as the DASH diet and exercise as tolerated.  

## 2019-07-10 NOTE — Assessment & Plan Note (Signed)
Avoid offending foods, start probiotics. Do not eat large meals in late evening and consider raising head of bed.  

## 2019-07-10 NOTE — Assessment & Plan Note (Signed)
Tolerating statin, encouraged heart healthy diet, avoid trans fats, minimize simple carbs and saturated fats. Increase exercise as tolerated 

## 2019-07-11 MED FILL — UNIFINE PENTIPS 31GX3/16": 31G X 5 MM | 50 days supply | Qty: 100 | Fill #0

## 2019-07-11 MED FILL — UNIFINE PENTIPS 31GX3/16: 31G X 5 MM | 50 days supply | Qty: 100 | Fill #0

## 2019-07-16 ENCOUNTER — Telehealth: Payer: Self-pay

## 2019-07-16 NOTE — Telephone Encounter (Signed)
Copied from [place] [id number]. Topic: General - Inquiry >> Jul 16, 2019 10:40 AM Virl Axe D wrote: Reason for CRM: Pt stated that as an fyi her FMLA papers are being faxed over from [employer]. Please advise.

## 2019-07-18 ENCOUNTER — Other Ambulatory Visit: Payer: Self-pay

## 2019-07-18 ENCOUNTER — Other Ambulatory Visit (INDEPENDENT_AMBULATORY_CARE_PROVIDER_SITE_OTHER): Payer: 59

## 2019-07-18 DIAGNOSIS — I10 Essential (primary) hypertension: Secondary | ICD-10-CM

## 2019-07-18 DIAGNOSIS — E1169 Type 2 diabetes mellitus with other specified complication: Secondary | ICD-10-CM

## 2019-07-18 DIAGNOSIS — E782 Mixed hyperlipidemia: Secondary | ICD-10-CM

## 2019-07-18 DIAGNOSIS — E669 Obesity, unspecified: Secondary | ICD-10-CM | POA: Diagnosis not present

## 2019-07-18 NOTE — Telephone Encounter (Signed)
Eileen Richards advised she has the FMLA forms and they will be ready next week. Pt informed.

## 2019-07-19 ENCOUNTER — Encounter: Payer: Self-pay | Admitting: Family Medicine

## 2019-07-19 LAB — COMPREHENSIVE METABOLIC PANEL
ALT: 15 U/L (ref 0–35)
AST: 13 U/L (ref 0–37)
Albumin: 4.1 g/dL (ref 3.5–5.2)
Alkaline Phosphatase: 98 U/L (ref 39–117)
BUN: 14 mg/dL (ref 6–23)
CO2: 32 mEq/L (ref 19–32)
Calcium: 9.7 mg/dL (ref 8.4–10.5)
Chloride: 100 mEq/L (ref 96–112)
Creatinine, Ser: 0.71 mg/dL (ref 0.40–1.20)
GFR: 102.77 mL/min (ref >60.00)
Glucose, Bld: 156 mg/dL — ABNORMAL HIGH (ref 70–99)
Potassium: 4.2 mEq/L (ref 3.5–5.1)
Sodium: 140 mEq/L (ref 135–145)
Total Bilirubin: 0.5 mg/dL (ref 0.2–1.2)
Total Protein: 7.2 g/dL (ref 6.0–8.3)

## 2019-07-19 LAB — HEMOGLOBIN A1C: Hgb A1c MFr Bld: 9.7 % — ABNORMAL HIGH (ref 4.6–6.5)

## 2019-07-19 LAB — LIPID PANEL
Cholesterol: 207 mg/dL — ABNORMAL HIGH (ref 0–200)
HDL: 35.9 mg/dL — ABNORMAL LOW (ref >39.00)
LDL Cholesterol: 134 mg/dL — ABNORMAL HIGH (ref 0–99)
NonHDL: 171.46
Total CHOL/HDL Ratio: 6
Triglycerides: 185 mg/dL — ABNORMAL HIGH (ref 0.0–149.0)
VLDL: 37 mg/dL (ref 0.0–40.0)

## 2019-07-19 LAB — CBC
HCT: 42.8 % (ref 36.0–46.0)
Hemoglobin: 13.9 g/dL (ref 12.0–15.0)
MCHC: 32.6 g/dL (ref 30.0–36.0)
MCV: 91.3 fl (ref 78.0–100.0)
Platelets: 418 10*3/uL — ABNORMAL HIGH (ref 150.0–400.0)
RBC: 4.68 Mil/uL (ref 3.87–5.11)
RDW: 14.3 % (ref 11.5–15.5)
WBC: 11.2 10*3/uL — ABNORMAL HIGH (ref 4.0–10.5)

## 2019-07-19 LAB — TSH: TSH: 1.21 u[IU]/mL (ref 0.35–4.50)

## 2019-07-23 ENCOUNTER — Other Ambulatory Visit: Payer: Self-pay

## 2019-07-23 ENCOUNTER — Ambulatory Visit (INDEPENDENT_AMBULATORY_CARE_PROVIDER_SITE_OTHER): Payer: 59 | Admitting: Family Medicine

## 2019-07-23 DIAGNOSIS — E1169 Type 2 diabetes mellitus with other specified complication: Secondary | ICD-10-CM

## 2019-07-23 DIAGNOSIS — I10 Essential (primary) hypertension: Secondary | ICD-10-CM | POA: Diagnosis not present

## 2019-07-23 DIAGNOSIS — E669 Obesity, unspecified: Secondary | ICD-10-CM | POA: Diagnosis not present

## 2019-07-23 DIAGNOSIS — M5441 Lumbago with sciatica, right side: Secondary | ICD-10-CM

## 2019-07-23 MED ORDER — LANTUS SOLOSTAR 100 UNIT/ML ~~LOC~~ SOPN: 34.0000 [IU] | PEN_INJECTOR | Freq: Every day | SUBCUTANEOUS | 3 refills | Status: DC

## 2019-07-23 MED ORDER — HYOSCYAMINE SULFATE 0.125 MG SL SUBL: 0.1250 mg | SUBLINGUAL_TABLET | SUBLINGUAL | 0 refills | Status: DC | PRN

## 2019-07-23 MED ORDER — METFORMIN HCL ER 500 MG PO TB24: 500.0000 mg | ORAL_TABLET | Freq: Every day | ORAL | 5 refills | Status: DC

## 2019-07-23 MED FILL — OSCIMIN SL 0.125 MG TABLET: 0.125 | 5 days supply | Qty: 30 | Fill #0

## 2019-07-23 NOTE — Progress Notes (Signed)
Can you let Dyana know I have sent her FMLA forms to [employer] for her

## 2019-07-23 NOTE — Assessment & Plan Note (Signed)
Monitor and report concerns, no changes to meds. Encouraged heart healthy diet such as the DASH diet and exercise as tolerated.  

## 2019-07-23 NOTE — Progress Notes (Signed)
Virtual Visit via Video Note  I connected with Eileen Richards on 07/23/19 at  3:40 PM EST by a video enabled telemedicine application and verified that I am speaking with the correct person using two identifiers.  Location: Patient: home Provider: office   I discussed the limitations of evaluation and management by telemedicine and the availability of in person appointments. The patient expressed understanding and agreed to proceed.    Subjective:    Patient ID: Eileen Richards, female    DOB: 1961-08-07, 58 y.o.   MRN: [id number]  No chief complaint on file.   HPI Patient is in today for follow up on chronic medical concerns including diabetes and hypertension. Her sugars remain high but she notes they are improving.  Stopped her Metformin just a couple of days ago when she heard about a recall. She notes her stomach discomfort and diarrhea improved. She notes her sugars recently highest was in the 190s and low of 98. Denies CP/palp/SOB/HA/congestion/fevers/GI or GU c/o. Taking meds as prescribed  Past Medical History:  Diagnosis Date  . Achilles tendonitis 10/14/2012  . Asthma    childhood  . Atypical chest pain    normal stress echo 02/2009 -LV size was normal LV global systolic function was normal- Normal wall motion; no LV regional wall motion abnormalities  . Benign paroxysmal positional vertigo 04/26/2015  . Chest pain, atypical 08/24/2013  . Diabetes mellitus   . GERD (gastroesophageal reflux disease)   . Hand pain, left 11/10/2015  . Hyperlipemia   . Hypertension   . Neuropathic pain of ankle, left 04/27/2017  . Onychomycosis 04/17/2014  . Pain of right heel 10/14/2012  . Preventative health care 11/22/2015  . Sinusitis, acute 01/17/2016  . Thoracic outlet syndrome 11/03/2014  . Urinary urgency 12/15/2015    Past Surgical History:  Procedure Laterality Date  . ABDOMINAL HYSTERECTOMY    . CYSTOSCOPY  07/23/2012   Procedure: CYSTOSCOPY;  Surgeon: Marvene Staff, MD;   Location: [facility];  Service: Gynecology;  Laterality: N/A;  . DILATION AND CURETTAGE OF UTERUS     Lysis of Adhesions  . ROBOTIC ASSISTED LAPAROSCOPIC LYSIS OF ADHESION  07/23/2012   Procedure: ROBOTIC ASSISTED LAPAROSCOPIC LYSIS OF ADHESION;  Surgeon: Marvene Staff, MD;  Location: [facility];  Service: Gynecology;  Laterality: N/A;  . ROBOTIC ASSISTED SALPINGO OOPHERECTOMY  07/23/2012   Procedure: ROBOTIC ASSISTED SALPINGO OOPHERECTOMY;  Surgeon: Marvene Staff, MD;  Location: [facility];  Service: Gynecology;  Laterality: Bilateral;    Family History  Problem Relation Age of Onset  . GER disease Mother   . GER disease Father   . Heart disease Father        father has "heart problems"  . Anemia Father   . Asthma Other   . Hypertension Other   . Diabetes Other     Social History   Socioeconomic History  . Marital status: Single    Spouse name: Not on file  . Number of children: Not on file  . Years of education: Not on file  . Highest education level: Not on file  Occupational History  . Not on file  Social Needs  . Financial resource strain: Not on file  . Food insecurity    Worry: Not on file    Inability: Not on file  . Transportation needs    Medical: Not on file    Non-medical: Not on file  Tobacco Use  . Smoking status: Former Research scientist (life sciences)  . Smokeless tobacco: Never  Used  Substance and Sexual Activity  . Alcohol use: No  . Drug use: No  . Sexual activity: Not on file  Lifestyle  . Physical activity    Days per week: Not on file    Minutes per session: Not on file  . Stress: Not on file  Relationships  . Social Herbalist on phone: Not on file    Gets together: Not on file    Attends religious service: Not on file    Active member of club or organization: Not on file    Attends meetings of clubs or organizations: Not on file    Relationship status: Not on file  . Intimate partner violence    Fear of current or ex partner: Not on file     Emotionally abused: Not on file    Physically abused: Not on file    Forced sexual activity: Not on file  Other Topics Concern  . Not on file  Social History Narrative  . Not on file    Outpatient Medications Prior to Visit  Medication Sig Dispense Refill  . ACCU-CHEK FASTCLIX LANCETS MISC Use as directed twice daily to check blood sugar.  DX E11.9 102 each 6  . albuterol (PROVENTIL HFA;VENTOLIN HFA) 108 (90 Base) MCG/ACT inhaler Inhale 2 puffs into the lungs every 6 (six) hours as needed for wheezing or shortness of breath. 1 Inhaler 6  . aspirin 81 MG tablet Take 81 mg by mouth daily.    Marland Kitchen atorvastatin (LIPITOR) 20 MG tablet Take 1 tablet (20 mg total) by mouth daily. 90 tablet 3  . Blood Glucose Monitoring Suppl (ACCU-CHEK GUIDE) w/Device KIT Inject 1 kit as directed 2 (two) times daily. Use to check blood sugar.  DX E11.9 1 kit 0  . carvedilol (COREG) 3.125 MG tablet TAKE 1 TABLET BY MOUTH 2 TIMES DAILY WITH A MEAL. 60 tablet 0  . cetirizine (ZYRTEC) 10 MG tablet Take 1 tablet (10 mg total) by mouth 2 (two) times daily. 180 tablet 1  . famotidine (PEPCID) 20 MG tablet Take 1 tablet (20 mg total) by mouth 2 (two) times daily. 180 tablet 1  . glimepiride (AMARYL) 4 MG tablet TAKE 1 TABLET (4 MG TOTAL) BY MOUTH 2 TIMES DAILY. 180 tablet 0  . glucose blood (ACCU-CHEK GUIDE) test strip Use as directed twice daily to check blood sugar.  DX E11.9 100 each 6  . hydrochlorothiazide (HYDRODIURIL) 25 MG tablet TAKE 1 TABLET (25 MG TOTAL) BY MOUTH DAILY. 30 tablet 0  . Insulin Pen Needle (UNIFINE PENTIPS) 31G X 5 MM MISC USE AS DIRECTED 100 each 2  . JANUVIA 100 MG tablet TAKE 1 TABLET BY MOUTH DAILY. 30 tablet 0  . Lancet Device MISC by Does not apply route. Use to test blood sugar three times daily     . meclizine (ANTIVERT) 25 MG tablet Take 1 tablet (25 mg total) by mouth 3 (three) times daily as needed for dizziness. 30 tablet 0  . naproxen (NAPROSYN) 500 MG tablet     . nitroGLYCERIN  (NITROSTAT) 0.4 MG SL tablet Place 1 tablet (0.4 mg total) under the tongue every 5 (five) minutes x 3 doses as needed for chest pain. 40 tablet 1  . NON FORMULARY [employer]  Anti-fungal (nail)-#1    . pantoprazole (PROTONIX) 40 MG tablet TAKE 1 TABLET (40 MG TOTAL) BY MOUTH DAILY. 30 tablet 5  . potassium chloride SA (KLOR-CON) 20 MEQ tablet TAKE 1 TABLET (  20 MEQ TOTAL) BY MOUTH DAILY. 30 tablet 0  . promethazine (PHENERGAN) 25 MG tablet Take 1 tablet (25 mg total) by mouth every 8 (eight) hours as needed for nausea or vomiting. 20 tablet 0  . tiZANidine (ZANAFLEX) 4 MG tablet Take 1 tablet (4 mg total) by mouth every 6 (six) hours as needed for muscle spasms. 40 tablet 1  . traMADol (ULTRAM) 50 MG tablet TAKE 2 TABLETS BY MOUTH TWICE DAILY AS NEEDED 60 tablet 0  . VICTOZA 18 MG/3ML SOPN INJECT 0.3 MLS (1.8 MG TOTAL) INTO THE SKIN DAILY. 9 mL 3  . hyoscyamine (LEVSIN SL) 0.125 MG SL tablet Place 1 tablet (0.125 mg total) under the tongue every 4 (four) hours as needed. 30 tablet 0  . Insulin Glargine (LANTUS SOLOSTAR) 100 UNIT/ML Solostar Pen Inject 18 Units into the skin daily. Increase 2 units every 3 days until fasting blood sugars approach 100. 5 pen 3  . metFORMIN (GLUCOPHAGE-XR) 500 MG 24 hr tablet TAKE 2 TABLETS BY MOUTH TWICE DAILY AND 1 TABLET AT NOON 150 tablet 5   No facility-administered medications prior to visit.     Allergies  Allergen Reactions  . Wilder Glade [Dapagliflozin] Hives    Review of Systems  Constitutional: Negative for fever and malaise/fatigue.  HENT: Negative for congestion.   Eyes: Negative for blurred vision.  Respiratory: Negative for shortness of breath.   Cardiovascular: Negative for chest pain, palpitations and leg swelling.  Gastrointestinal: Negative for abdominal pain, blood in stool and nausea.  Genitourinary: Negative for dysuria and frequency.  Musculoskeletal: Negative for falls.  Skin: Negative for rash.  Neurological: Negative for  dizziness, loss of consciousness and headaches.  Endo/Heme/Allergies: Negative for environmental allergies.  Psychiatric/Behavioral: Negative for depression. The patient is not nervous/anxious.        Objective:    Physical Exam Constitutional:      Appearance: Normal appearance. She is not ill-appearing.  HENT:     Head: Normocephalic and atraumatic.  Eyes:     General:        Right eye: No discharge.        Left eye: No discharge.  Pulmonary:     Effort: Pulmonary effort is normal.  Neurological:     Mental Status: She is alert and oriented to person, place, and time.  Psychiatric:        Behavior: Behavior normal.     There were no vitals taken for this visit. Wt Readings from Last 3 Encounters:  07/09/19 247 lb (112 kg)  03/05/19 235 lb (106.6 kg)  02/19/19 235 lb (106.6 kg)    Diabetic Foot Exam - Simple   No data filed     Lab Results  Component Value Date   WBC 11.2 (H) 07/18/2019   HGB 13.9 07/18/2019   HCT 42.8 07/18/2019   PLT 418.0 (H) 07/18/2019   GLUCOSE 156 (H) 07/18/2019   CHOL 207 (H) 07/18/2019   TRIG 185.0 (H) 07/18/2019   HDL 35.90 (L) 07/18/2019   LDLDIRECT 100.0 08/03/2017   LDLCALC 134 (H) 07/18/2019   ALT 15 07/18/2019   AST 13 07/18/2019   NA 140 07/18/2019   K 4.2 07/18/2019   CL 100 07/18/2019   CREATININE 0.71 07/18/2019   BUN 14 07/18/2019   CO2 32 07/18/2019   TSH 1.21 07/18/2019   HGBA1C 9.7 (H) 07/18/2019   MICROALBUR 0.9 11/12/2015    Lab Results  Component Value Date   TSH 1.21 07/18/2019   Lab Results  Component Value Date   WBC 11.2 (H) 07/18/2019   HGB 13.9 07/18/2019   HCT 42.8 07/18/2019   MCV 91.3 07/18/2019   PLT 418.0 (H) 07/18/2019   Lab Results  Component Value Date   NA 140 07/18/2019   K 4.2 07/18/2019   CO2 32 07/18/2019   GLUCOSE 156 (H) 07/18/2019   BUN 14 07/18/2019   CREATININE 0.71 07/18/2019   BILITOT 0.5 07/18/2019   ALKPHOS 98 07/18/2019   AST 13 07/18/2019   ALT 15 07/18/2019    PROT 7.2 07/18/2019   ALBUMIN 4.1 07/18/2019   CALCIUM 9.7 07/18/2019   GFR 102.77 07/18/2019   Lab Results  Component Value Date   CHOL 207 (H) 07/18/2019   Lab Results  Component Value Date   HDL 35.90 (L) 07/18/2019   Lab Results  Component Value Date   LDLCALC 134 (H) 07/18/2019   Lab Results  Component Value Date   TRIG 185.0 (H) 07/18/2019   Lab Results  Component Value Date   CHOLHDL 6 07/18/2019   Lab Results  Component Value Date   HGBA1C 9.7 (H) 07/18/2019       Assessment & Plan:   Problem List Items Addressed This Visit    Diabetes mellitus type 2 in obese ([place])    Stopped her Metformin just a couple of days ago when she heard about a recall. She notes her stomach discomfort and diarrhea improved. We will restart Metformin XR 500 mg tabs but at just 1 tab qam. Her Lantus was at 28 will have her increase to 30 units now and then increase by 2 units every 3 days if no low numbers. She notes her sugars recently highest was in the 190s and low of 98. She is offered endocrinology referral and declines for now. We will have a repeat appt in roughly 3 weeks to make further adjustments.      Relevant Medications   Insulin Glargine (LANTUS SOLOSTAR) 100 UNIT/ML Solostar Pen   metFORMIN (GLUCOPHAGE-XR) 500 MG 24 hr tablet   Essential hypertension    Monitor and report concerns, no changes to meds. Encouraged heart healthy diet such as the DASH diet and exercise as tolerated.       Low back pain with right-sided sciatica    Chronic pain noted. FMLA paperwork completed today for numerous conditions.          I have changed Eileen Richards's Insulin Glargine to Lantus SoloStar. I have also changed her metFORMIN. I am also having her maintain her Lancet Device, aspirin, meclizine, promethazine, glucose blood, Accu-Chek FastClix Lancets, atorvastatin, albuterol, Accu-Chek Guide, nitroGLYCERIN, traMADol, Insulin Pen Needle, famotidine, cetirizine, tiZANidine,  naproxen, NON FORMULARY, pantoprazole, Victoza, glimepiride, carvedilol, hydrochlorothiazide, potassium chloride SA, Januvia, and hyoscyamine.  Meds ordered this encounter  Medications  . Insulin Glargine (LANTUS SOLOSTAR) 100 UNIT/ML Solostar Pen    Sig: Inject 34 Units into the skin daily. Increase 2 units every 3 days until fasting blood sugars approach 100.    Dispense:  5 pen    Refill:  3  . metFORMIN (GLUCOPHAGE-XR) 500 MG 24 hr tablet    Sig: Take 1 tablet (500 mg total) by mouth daily with breakfast. TAKE 2 TABLETS BY MOUTH TWICE DAILY AND 1 TABLET AT NOON    Dispense:  150 tablet    Refill:  5    D/c previous script  . hyoscyamine (LEVSIN SL) 0.125 MG SL tablet    Sig: Place 1 tablet (0.125 mg total) under the  tongue every 4 (four) hours as needed.    Dispense:  30 tablet    Refill:  0     I discussed the assessment and treatment plan with the patient. The patient was provided an opportunity to ask questions and all were answered. The patient agreed with the plan and demonstrated an understanding of the instructions.   The patient was advised to call back or seek an in-person evaluation if the symptoms worsen or if the condition fails to improve as anticipated.  I provided 25 minutes of non-face-to-face time during this encounter.   Penni Homans, MD

## 2019-07-23 NOTE — Assessment & Plan Note (Signed)
Chronic pain noted. FMLA paperwork completed today for numerous conditions.

## 2019-07-23 NOTE — Assessment & Plan Note (Signed)
Stopped her Metformin just a couple of days ago when she heard about a recall. She notes her stomach discomfort and diarrhea improved. We will restart Metformin XR 500 mg tabs but at just 1 tab qam. Her Lantus was at 28 will have her increase to 30 units now and then increase by 2 units every 3 days if no low numbers. She notes her sugars recently highest was in the 190s and low of 98. She is offered endocrinology referral and declines for now. We will have a repeat appt in roughly 3 weeks to make further adjustments.

## 2019-07-25 MED FILL — LANTUS SOLOSTAR 100 UNITS/M: 100 | 44 days supply | Qty: 15 | Fill #0

## 2019-07-29 ENCOUNTER — Encounter: Payer: Self-pay | Admitting: Family Medicine

## 2019-07-29 NOTE — Telephone Encounter (Signed)
Pt stated her FMLA paperwork needs to have the specific reason she is out of work. Stated Dr. Charlett Blake put as needed but it needs to be specific. Requesting papers to be filled out again and resent back. Please advise.

## 2019-07-30 NOTE — Telephone Encounter (Signed)
Spoke with patient and added more time and fixed FMLA forms and faxed them over

## 2019-08-01 ENCOUNTER — Other Ambulatory Visit: Payer: Self-pay | Admitting: Family Medicine

## 2019-08-01 MED FILL — VICTOZA 18 MG/3 ML INJECT P: 18 | 30 days supply | Qty: 9 | Fill #3

## 2019-08-01 MED FILL — JANUVIA 100 MG TABLET: 100 | 30 days supply | Qty: 30 | Fill #0

## 2019-08-01 NOTE — Telephone Encounter (Signed)
Can you call to set up follow up please.

## 2019-08-02 NOTE — Telephone Encounter (Signed)
Spoke with patient about medication changes, she stated she will do this over the next week and will call and schedule an appointment if need be

## 2019-08-12 ENCOUNTER — Telehealth: Payer: Self-pay

## 2019-08-12 ENCOUNTER — Other Ambulatory Visit: Payer: Self-pay | Admitting: Family Medicine

## 2019-08-12 MED FILL — ACCU-CHEK GUIDE TEST STRIP: 50 days supply | Qty: 100 | Fill #0

## 2019-08-12 MED FILL — ACCU-CHEK FASTCLIX LANCETS: 51 days supply | Qty: 102 | Fill #0

## 2019-08-12 NOTE — Telephone Encounter (Signed)
PA initiated via Covermymeds; KEY: [id number]. Awaiting determination.

## 2019-08-21 NOTE — Telephone Encounter (Signed)
PA cancelled.  Prior Authorization is not required for this medication dosage form and strength at the quantity and days supply requested. This medication does not require a prior authorization because it is a covered benefit. Please have the pharmacy contact [place] at [phone] if billing assistance is required

## 2019-08-22 ENCOUNTER — Telehealth: Payer: Self-pay

## 2019-08-22 NOTE — Telephone Encounter (Signed)
Copied from [facility] [id number]. Topic: General - Inquiry >> Aug 21, 2019  5:27 PM Percell Belt A wrote: Reason for CRM: pt called in and stated that she as some papers from the hosp that she needs Dr Charlett Blake to sign.  She would not give me anymore info.  She wanted a call back from the nurse Best number  [phone]

## 2019-08-23 NOTE — Telephone Encounter (Signed)
Patient called back to request a call from Dr Charlett Blake nurse ASAP please, she say that it is important that someone call her back. Ph#   [phone]

## 2019-08-23 NOTE — Telephone Encounter (Signed)
Spoke with patient about message below. Patient stated she needs form filled out and will bring it in next week.

## 2019-08-30 ENCOUNTER — Other Ambulatory Visit: Payer: Self-pay | Admitting: Family Medicine

## 2019-08-30 MED FILL — PANTOPRAZOLE SOD DR 40 MG T: 40 | 30 days supply | Qty: 30 | Fill #4

## 2019-08-30 MED FILL — JANUVIA 100 MG TABLET: 100 | 30 days supply | Qty: 30 | Fill #1

## 2019-08-30 MED FILL — SM ACID REDUCER 20 MG TAB: 20 | 25 days supply | Qty: 25 | Fill #3

## 2019-08-30 MED FILL — CARVEDILOL 3.125 MG TABLET: 3.125 | 30 days supply | Qty: 60 | Fill #0

## 2019-08-30 MED FILL — CETIRIZINE HCL 10 MG TABS: 10 | 100 days supply | Qty: 200 | Fill #0

## 2019-08-30 MED FILL — HYDROCHLOROTHIAZIDE 25 MG T: 25 | 30 days supply | Qty: 30 | Fill #0

## 2019-08-30 MED FILL — GLIMEPIRIDE 4 MG TABLET: 4 | 90 days supply | Qty: 180 | Fill #0

## 2019-09-02 ENCOUNTER — Encounter: Payer: Self-pay | Admitting: Family Medicine

## 2019-09-05 ENCOUNTER — Other Ambulatory Visit: Payer: Self-pay | Admitting: Family Medicine

## 2019-09-05 MED FILL — VICTOZA 18 MG/3 ML INJECT P: 18 | 30 days supply | Qty: 9 | Fill #0

## 2019-09-09 MED FILL — LANTUS SOLOSTAR 100 UNITS/M: 100 | 44 days supply | Qty: 15 | Fill #1

## 2019-09-16 ENCOUNTER — Other Ambulatory Visit: Payer: Self-pay | Admitting: Family Medicine

## 2019-09-16 MED FILL — METFORMIN HCL ER 500 MG TB2: 500 | 30 days supply | Qty: 150 | Fill #1

## 2019-09-17 MED FILL — UNIFINE PENTIPS 31GX3/16: 31G X 5 MM | 50 days supply | Qty: 100 | Fill #0

## 2019-09-17 MED FILL — UNIFINE PENTIPS 31GX3/16": 31G X 5 MM | 50 days supply | Qty: 100 | Fill #0

## 2019-09-26 ENCOUNTER — Other Ambulatory Visit: Payer: Self-pay | Admitting: Family Medicine

## 2019-09-27 MED FILL — HYDROCHLOROTHIAZIDE 25 MG T: 25 | 30 days supply | Qty: 30 | Fill #0

## 2019-09-30 ENCOUNTER — Telehealth: Payer: Self-pay | Admitting: Podiatry

## 2019-09-30 NOTE — Telephone Encounter (Signed)
Left voicemail for patient to call back and schedule appt per appt request through [facility]

## 2019-10-01 ENCOUNTER — Other Ambulatory Visit: Payer: Self-pay | Admitting: Family Medicine

## 2019-10-01 MED FILL — FAMOTIDINE 20 MG TABLET: 20 | 25 days supply | Qty: 50 | Fill #0

## 2019-10-08 DIAGNOSIS — Z6841 Body Mass Index (BMI) 40.0 and over, adult: Secondary | ICD-10-CM | POA: Diagnosis not present

## 2019-10-08 DIAGNOSIS — Z01419 Encounter for gynecological examination (general) (routine) without abnormal findings: Secondary | ICD-10-CM | POA: Diagnosis not present

## 2019-10-15 ENCOUNTER — Telehealth: Payer: Self-pay | Admitting: *Deleted

## 2019-10-15 MED ORDER — FAMOTIDINE 20 MG PO TABS: 20.0000 mg | ORAL_TABLET | Freq: Two times a day (BID) | ORAL | 1 refills | Status: DC

## 2019-10-15 NOTE — Telephone Encounter (Signed)
Received fax form [facility] requesting 90 day supply of SM Acid reducer 20mg , take 1 tablet twice a day. Refill sent. Per 07/23/19 office note, pt was supposed to follow up with PCP in 3 weeks. Sent mychart message to schedule oV.

## 2019-10-18 MED FILL — VICTOZA 18 MG/3 ML INJECT P: 18 | 30 days supply | Qty: 9 | Fill #1

## 2019-10-25 MED FILL — JANUVIA 100 MG TABLET: 100 | 30 days supply | Qty: 30 | Fill #2

## 2019-10-25 MED FILL — LANTUS SOLOSTAR 100 UNITS/M: 100 | 44 days supply | Qty: 15 | Fill #2

## 2019-10-25 NOTE — Telephone Encounter (Signed)
Opened in error

## 2019-10-29 NOTE — Telephone Encounter (Signed)
Mychart message came back as unread. Spoke with pt and scheduled Virtual Follow up for 11/26/19 at 2pm.

## 2019-11-04 ENCOUNTER — Ambulatory Visit (INDEPENDENT_AMBULATORY_CARE_PROVIDER_SITE_OTHER): Payer: 59

## 2019-11-04 ENCOUNTER — Other Ambulatory Visit: Payer: Self-pay | Admitting: Podiatry

## 2019-11-04 ENCOUNTER — Ambulatory Visit: Payer: 59 | Admitting: Podiatry

## 2019-11-04 ENCOUNTER — Other Ambulatory Visit: Payer: Self-pay

## 2019-11-04 DIAGNOSIS — M778 Other enthesopathies, not elsewhere classified: Secondary | ICD-10-CM

## 2019-11-04 DIAGNOSIS — M79671 Pain in right foot: Secondary | ICD-10-CM

## 2019-11-04 DIAGNOSIS — M779 Enthesopathy, unspecified: Secondary | ICD-10-CM | POA: Diagnosis not present

## 2019-11-04 DIAGNOSIS — M722 Plantar fascial fibromatosis: Secondary | ICD-10-CM | POA: Diagnosis not present

## 2019-11-04 MED ORDER — MELOXICAM 7.5 MG PO TABS: 7.5000 mg | ORAL_TABLET | Freq: Every day | ORAL | 0 refills | Status: DC

## 2019-11-04 MED FILL — MELOXICAM 7.5 MG TABLET: 7.5 | 14 days supply | Qty: 14 | Fill #0

## 2019-11-04 NOTE — Progress Notes (Signed)
Subjective: 59 year old female presents the office today for concerns of bilateral foot pain patient states sharp shooting pains which is been on for last several months and is worse after she works on her feet all day.  She denies any recent injury to her foot.  No rating pain or weakness.  No recent falls.  She is diabetic and last A1c was 9.7. Denies any systemic complaints such as fevers, chills, nausea, vomiting. No acute changes since last appointment, and no other complaints at this time.   Objective: AAO x3, NAD DP/PT pulses palpable bilaterally, CRT less than 3 seconds Mild bony deformities present.  There is tenderness palpation of the right posterior aspect the heel and the insertion of Achilles tendon.  Also mild discomfort on the plantar aspect calcaneal insertion of plantar fascial.  There is no area of pinpoint tenderness.  There is no edema, erythema.  Negative Tinel sign. No open lesions or pre-ulcerative lesions.  No pain with calf compression, swelling, warmth, erythema  Assessment: Tendinitis, plantar fasciitis  Plan: -All treatment options discussed with the patient including all alternatives, risks, complications.  -Discussed traction, rehab exercises.  Prescribed meloxicam 7.5 mg to take as needed.  Discussed shoe modifications and orthotics. -Concern of possible neuropathy.  Discussed gabapentin given the sharp shooting pains but we will see how she does with treating the tendinitis. -Patient encouraged to call the office with any questions, concerns, change in symptoms.   Trula Slade DPM

## 2019-11-04 NOTE — Patient Instructions (Addendum)
Meloxicam tablets- TAKE ONLY AS NEEDED What is this medicine? MELOXICAM (mel OX i cam) is a non-steroidal anti-inflammatory drug (NSAID). It is used to reduce swelling and to treat pain. It may be used for osteoarthritis, rheumatoid arthritis, or juvenile rheumatoid arthritis. This medicine may be used for other purposes; ask your health care provider or pharmacist if you have questions. COMMON BRAND NAME(S): Mobic What should I tell my health care provider before I take this medicine? They need to know if you have any of these conditions:  bleeding disorders  cigarette smoker  coronary artery bypass graft (CABG) surgery within the past 2 weeks  drink more than 3 alcohol-containing drinks per day  heart disease  high blood pressure  history of stomach bleeding  kidney disease  liver disease  lung or breathing disease, like asthma  stomach or intestine problems  an unusual or allergic reaction to meloxicam, aspirin, other NSAIDs, other medicines, foods, dyes, or preservatives  pregnant or trying to get pregnant  breast-feeding How should I use this medicine? Take this medicine by mouth with a full glass of water. Follow the directions on the prescription label. You can take it with or without food. If it upsets your stomach, take it with food. Take your medicine at regular intervals. Do not take it more often than directed. Do not stop taking except on your doctor's advice. A special MedGuide will be given to you by the pharmacist with each prescription and refill. Be sure to read this information carefully each time. Talk to your pediatrician regarding the use of this medicine in children. While this drug may be prescribed for selected conditions, precautions do apply. Patients over 57 years old may have a stronger reaction and need a smaller dose. Overdosage: If you think you have taken too much of this medicine contact a poison control center or emergency room at  once. NOTE: This medicine is only for you. Do not share this medicine with others. What if I miss a dose? If you miss a dose, take it as soon as you can. If it is almost time for your next dose, take only that dose. Do not take double or extra doses. What may interact with this medicine? Do not take this medicine with any of the following medications:  cidofovir  ketorolac This medicine may also interact with the following medications:  aspirin and aspirin-like medicines  certain medicines for blood pressure, heart disease, irregular heart beat  certain medicines for depression, anxiety, or psychotic disturbances  certain medicines that treat or prevent blood clots like warfarin, enoxaparin, dalteparin, apixaban, dabigatran, rivaroxaban  cyclosporine  diuretics  fluconazole  lithium  methotrexate  other NSAIDs, medicines for pain and inflammation, like ibuprofen and naproxen  pemetrexed This list may not describe all possible interactions. Give your health care provider a list of all the medicines, herbs, non-prescription drugs, or dietary supplements you use. Also tell them if you smoke, drink alcohol, or use illegal drugs. Some items may interact with your medicine. What should I watch for while using this medicine? Tell your doctor or healthcare provider if your symptoms do not start to get better or if they get worse. This medicine may cause serious skin reactions. They can happen weeks to months after starting the medicine. Contact your healthcare provider right away if you notice fevers or flu-like symptoms with a rash. The rash may be red or purple and then turn into blisters or peeling of the skin. Or, you might notice  a red rash with swelling of the face, lips or lymph nodes in your neck or under your arms. Do not take other medicines that contain aspirin, ibuprofen, or naproxen with this medicine. Side effects such as stomach upset, nausea, or ulcers may be more likely  to occur. Many medicines available without a prescription should not be taken with this medicine. This medicine can cause ulcers and bleeding in the stomach and intestines at any time during treatment. This can happen with no warning and may cause death. There is increased risk with taking this medicine for a long time. Smoking, drinking alcohol, older age, and poor health can also increase risks. Call your doctor right away if you have stomach pain or blood in your vomit or stool. This medicine does not prevent heart attack or stroke. In fact, this medicine may increase the chance of a heart attack or stroke. The chance may increase with longer use of this medicine and in people who have heart disease. If you take aspirin to prevent heart attack or stroke, talk with your doctor or healthcare provider. What side effects may I notice from receiving this medicine? Side effects that you should report to your doctor or health care professional as soon as possible:  allergic reactions like skin rash, itching or hives, swelling of the face, lips, or tongue  nausea, vomiting  redness, blistering, peeling, or loosening of the skin, including inside the mouth  signs and symptoms of a blood clot such as breathing problems; changes in vision; chest pain; severe, sudden headache; pain, swelling, warmth in the leg; trouble speaking; sudden numbness or weakness of the face, arm, or leg  signs and symptoms of bleeding such as bloody or black, tarry stools; red or dark-brown urine; spitting up blood or brown material that looks like coffee grounds; red spots on the skin; unusual bruising or bleeding from the eye, gums, or nose  signs and symptoms of liver injury like dark yellow or brown urine; general ill feeling or flu-like symptoms; light-colored stools; loss of appetite; nausea; right upper belly pain; unusually weak or tired; yellowing of the eyes or skin  signs and symptoms of stroke like changes in vision;  confusion; trouble speaking or understanding; severe headaches; sudden numbness or weakness of the face, arm, or leg; trouble walking; dizziness; loss of balance or coordination Side effects that usually do not require medical attention (report to your doctor or health care professional if they continue or are bothersome):  constipation  diarrhea  gas This list may not describe all possible side effects. Call your doctor for medical advice about side effects. You may report side effects to FDA at 1-800-FDA-1088. Where should I keep my medicine? Keep out of the reach of children. Store at room temperature between 15 and 30 degrees C (59 and 86 degrees F). Throw away any unused medicine after the expiration date. NOTE: This sheet is a summary. It may not cover all possible information. If you have questions about this medicine, talk to your doctor, pharmacist, or health care provider.  2020 [employer] Standard (2018-12-05 11:21:28)   Plantar Fasciitis (Heel Spur Syndrome) with Rehab The plantar fascia is a fibrous, ligament-like, soft-tissue structure that spans the bottom of the foot. Plantar fasciitis is a condition that causes pain in the foot due to inflammation of the tissue. SYMPTOMS   Pain and tenderness on the underneath side of the foot.  Pain that worsens with standing or walking. CAUSES  Plantar fasciitis is caused by  irritation and injury to the plantar fascia on the underneath side of the foot. Common mechanisms of injury include:  Direct trauma to bottom of the foot.  Damage to a small nerve that runs under the foot where the main fascia attaches to the heel bone.  Stress placed on the plantar fascia due to bone spurs. RISK INCREASES WITH:   Activities that place stress on the plantar fascia (running, jumping, pivoting, or cutting).  Poor strength and flexibility.  Improperly fitted shoes.  Tight calf muscles.  Flat feet.  Failure to warm-up properly before  activity.  Obesity. PREVENTION  Warm up and stretch properly before activity.  Allow for adequate recovery between workouts.  Maintain physical fitness:  Strength, flexibility, and endurance.  Cardiovascular fitness.  Maintain a health body weight.  Avoid stress on the plantar fascia.  Wear properly fitted shoes, including arch supports for individuals who have flat feet.  PROGNOSIS  If treated properly, then the symptoms of plantar fasciitis usually resolve without surgery. However, occasionally surgery is necessary.  RELATED COMPLICATIONS   Recurrent symptoms that may result in a chronic condition.  Problems of the lower back that are caused by compensating for the injury, such as limping.  Pain or weakness of the foot during push-off following surgery.  Chronic inflammation, scarring, and partial or complete fascia tear, occurring more often from repeated injections.  TREATMENT  Treatment initially involves the use of ice and medication to help reduce pain and inflammation. The use of strengthening and stretching exercises may help reduce pain with activity, especially stretches of the Achilles tendon. These exercises may be performed at home or with a therapist. Your caregiver may recommend that you use heel cups of arch supports to help reduce stress on the plantar fascia. Occasionally, corticosteroid injections are given to reduce inflammation. If symptoms persist for greater than 6 months despite non-surgical (conservative), then surgery may be recommended.   MEDICATION   If pain medication is necessary, then nonsteroidal anti-inflammatory medications, such as aspirin and ibuprofen, or other minor pain relievers, such as acetaminophen, are often recommended.  Do not take pain medication within 7 days before surgery.  Prescription pain relievers may be given if deemed necessary by your caregiver. Use only as directed and only as much as you need.  Corticosteroid  injections may be given by your caregiver. These injections should be reserved for the most serious cases, because they may only be given a certain number of times.  HEAT AND COLD  Cold treatment (icing) relieves pain and reduces inflammation. Cold treatment should be applied for 10 to 15 minutes every 2 to 3 hours for inflammation and pain and immediately after any activity that aggravates your symptoms. Use ice packs or massage the area with a piece of ice (ice massage).  Heat treatment may be used prior to performing the stretching and strengthening activities prescribed by your caregiver, physical therapist, or athletic trainer. Use a heat pack or soak the injury in warm water.  SEEK IMMEDIATE MEDICAL CARE IF:  Treatment seems to offer no benefit, or the condition worsens.  Any medications produce adverse side effects.  EXERCISES- RANGE OF MOTION (ROM) AND STRETCHING EXERCISES - Plantar Fasciitis (Heel Spur Syndrome) These exercises may help you when beginning to rehabilitate your injury. Your symptoms may resolve with or without further involvement from your physician, physical therapist or athletic trainer. While completing these exercises, remember:   Restoring tissue flexibility helps normal motion to return to the joints. This allows  healthier, less painful movement and activity.  An effective stretch should be held for at least 30 seconds.  A stretch should never be painful. You should only feel a gentle lengthening or release in the stretched tissue.  RANGE OF MOTION - Toe Extension, Flexion  Sit with your right / left leg crossed over your opposite knee.  Grasp your toes and gently pull them back toward the top of your foot. You should feel a stretch on the bottom of your toes and/or foot.  Hold this stretch for 10 seconds.  Now, gently pull your toes toward the bottom of your foot. You should feel a stretch on the top of your toes and or foot.  Hold this stretch for 10  seconds. Repeat  times. Complete this stretch 3 times per day.   RANGE OF MOTION - Ankle Dorsiflexion, Active Assisted  Remove shoes and sit on a chair that is preferably not on a carpeted surface.  Place right / left foot under knee. Extend your opposite leg for support.  Keeping your heel down, slide your right / left foot back toward the chair until you feel a stretch at your ankle or calf. If you do not feel a stretch, slide your bottom forward to the edge of the chair, while still keeping your heel down.  Hold this stretch for 10 seconds. Repeat 3 times. Complete this stretch 2 times per day.   STRETCH  Gastroc, Standing  Place hands on wall.  Extend right / left leg, keeping the front knee somewhat bent.  Slightly point your toes inward on your back foot.  Keeping your right / left heel on the floor and your knee straight, shift your weight toward the wall, not allowing your back to arch.  You should feel a gentle stretch in the right / left calf. Hold this position for 10 seconds. Repeat 3 times. Complete this stretch 2 times per day.  STRETCH  Soleus, Standing  Place hands on wall.  Extend right / left leg, keeping the other knee somewhat bent.  Slightly point your toes inward on your back foot.  Keep your right / left heel on the floor, bend your back knee, and slightly shift your weight over the back leg so that you feel a gentle stretch deep in your back calf.  Hold this position for 10 seconds. Repeat 3 times. Complete this stretch 2 times per day.  STRETCH  Gastrocsoleus, Standing  Note: This exercise can place a lot of stress on your foot and ankle. Please complete this exercise only if specifically instructed by your caregiver.   Place the ball of your right / left foot on a step, keeping your other foot firmly on the same step.  Hold on to the wall or a rail for balance.  Slowly lift your other foot, allowing your body weight to press your heel down over  the edge of the step.  You should feel a stretch in your right / left calf.  Hold this position for 10 seconds.  Repeat this exercise with a slight bend in your right / left knee. Repeat 3 times. Complete this stretch 2 times per day.   STRENGTHENING EXERCISES - Plantar Fasciitis (Heel Spur Syndrome)  These exercises may help you when beginning to rehabilitate your injury. They may resolve your symptoms with or without further involvement from your physician, physical therapist or athletic trainer. While completing these exercises, remember:   Muscles can gain both the endurance and the  strength needed for everyday activities through controlled exercises.  Complete these exercises as instructed by your physician, physical therapist or athletic trainer. Progress the resistance and repetitions only as guided.  STRENGTH - Towel Curls  Sit in a chair positioned on a non-carpeted surface.  Place your foot on a towel, keeping your heel on the floor.  Pull the towel toward your heel by only curling your toes. Keep your heel on the floor. Repeat 3 times. Complete this exercise 2 times per day.  STRENGTH - Ankle Inversion  Secure one end of a rubber exercise band/tubing to a fixed object (table, pole). Loop the other end around your foot just before your toes.  Place your fists between your knees. This will focus your strengthening at your ankle.  Slowly, pull your big toe up and in, making sure the band/tubing is positioned to resist the entire motion.  Hold this position for 10 seconds.  Have your muscles resist the band/tubing as it slowly pulls your foot back to the starting position. Repeat 3 times. Complete this exercises 2 times per day.  Document Released: 09/05/2005 Document Revised: 11/28/2011 Document Reviewed: 12/18/2008 [facility] Patient Information 2014 [place], [place].

## 2019-11-13 ENCOUNTER — Other Ambulatory Visit: Payer: Self-pay | Admitting: Family Medicine

## 2019-11-13 MED FILL — ATORVASTATIN 20 MG TABLET: 20 | 90 days supply | Qty: 90 | Fill #0

## 2019-11-13 MED FILL — PANTOPRAZOLE SOD DR 40 MG T: 40 | 30 days supply | Qty: 30 | Fill #5

## 2019-11-13 MED FILL — HYDROCHLOROTHIAZIDE 25 MG T: 25 | 30 days supply | Qty: 30 | Fill #0

## 2019-11-13 MED FILL — CARVEDILOL 3.125 MG TABLET: 3.125 | 30 days supply | Qty: 60 | Fill #0

## 2019-11-21 ENCOUNTER — Telehealth: Payer: Self-pay | Admitting: Podiatry

## 2019-11-21 NOTE — Telephone Encounter (Signed)
Left message for pt that her insurance covers orthotics at 80% after deductible and to call if any questions or she can discuss at appt on [phone].Marland Kitchen

## 2019-11-25 MED FILL — VICTOZA 18 MG/3 ML INJECT P: 18 | 30 days supply | Qty: 9 | Fill #2

## 2019-11-26 ENCOUNTER — Other Ambulatory Visit: Payer: Self-pay

## 2019-11-26 ENCOUNTER — Ambulatory Visit (INDEPENDENT_AMBULATORY_CARE_PROVIDER_SITE_OTHER): Payer: 59 | Admitting: Family Medicine

## 2019-11-26 VITALS — BP 113/74 | HR 89 | Temp 94.0°F

## 2019-11-26 DIAGNOSIS — M79671 Pain in right foot: Secondary | ICD-10-CM

## 2019-11-26 DIAGNOSIS — E1169 Type 2 diabetes mellitus with other specified complication: Secondary | ICD-10-CM

## 2019-11-26 DIAGNOSIS — E782 Mixed hyperlipidemia: Secondary | ICD-10-CM | POA: Diagnosis not present

## 2019-11-26 DIAGNOSIS — I10 Essential (primary) hypertension: Secondary | ICD-10-CM

## 2019-11-26 DIAGNOSIS — M79672 Pain in left foot: Secondary | ICD-10-CM

## 2019-11-26 DIAGNOSIS — E669 Obesity, unspecified: Secondary | ICD-10-CM

## 2019-11-26 MED ORDER — FLUCONAZOLE 150 MG PO TABS: 150.0000 mg | ORAL_TABLET | Freq: Once | ORAL | 0 refills | Status: DC

## 2019-11-26 MED FILL — FLUCONAZOLE 150 MG TABS: 150 | 1 days supply | Qty: 1 | Fill #0

## 2019-11-26 NOTE — Assessment & Plan Note (Signed)
Monitor and report any concerns, no changes to meds. Encouraged heart healthy diet such as the DASH diet and exercise as tolerated.  ?

## 2019-11-26 NOTE — Patient Instructions (Signed)
[facility] fax [phone]  Tell us what glucometer we are supposed to switch to

## 2019-11-27 ENCOUNTER — Encounter: Payer: Self-pay | Admitting: Family Medicine

## 2019-11-27 NOTE — Assessment & Plan Note (Signed)
She has switched to Crocs and that has helped some. She is following with podiatry as well. Reminded to stretch and use topical treatments.

## 2019-11-27 NOTE — Assessment & Plan Note (Signed)
[id number] unacceptable, minimize simple carbs. Increase exercise as tolerated. Continue current meds. She agrees to return for updated labs and then we will make adjustments to meds. She also agrees to referral to endocrinology once they are established in the office.

## 2019-11-27 NOTE — Assessment & Plan Note (Signed)
Tolerating statin, encouraged heart healthy diet, avoid trans fats, minimize simple carbs and saturated fats. Increase exercise as tolerated 

## 2019-11-27 NOTE — Progress Notes (Addendum)
Virtual Visit via Video Note  I connected with Cenia Zaragosa on 11/26/19 at  2:00 PM EST by a video enabled telemedicine application and verified that I am speaking with the correct person using two identifiers.  Location: Patient: home Provider: office   I discussed the limitations of evaluation and management by telemedicine and the availability of in person appointments. The patient expressed understanding and agreed to proceed. Kem Boroughs, CMA was able to get the patient set up on a visit, video   Subjective:    Patient ID: Sariya Trickey, female    DOB: Apr 30, 1961, 59 y.o.   MRN: [id number]  Chief Complaint  Patient presents with  . Follow-up    HPI Patient is in today for follow up on chronic medical concerns. No recent febrile illness or hospitalizations. She has been struggling with foot pain bilaterally it changes which foot hurts more. Her heel pain is some better since she started wearing Crocs. She has been seen by podiatry. The Meloxicam given was not overly helpful but she acknowledges she used it very little. Her sugars continue to run high. Most often int he 200s. Denies CP/palp/SOB/HA/congestion/fevers/GI or GU c/o. Taking meds as prescribed  Past Medical History:  Diagnosis Date  . Achilles tendonitis 10/14/2012  . Asthma    childhood  . Atypical chest pain    normal stress echo 02/2009 -LV size was normal LV global systolic function was normal- Normal wall motion; no LV regional wall motion abnormalities  . Benign paroxysmal positional vertigo 04/26/2015  . Chest pain, atypical 08/24/2013  . Diabetes mellitus   . GERD (gastroesophageal reflux disease)   . Hand pain, left 11/10/2015  . Hyperlipemia   . Hypertension   . Neuropathic pain of ankle, left 04/27/2017  . Onychomycosis 04/17/2014  . Pain of right heel 10/14/2012  . Preventative health care 11/22/2015  . Sinusitis, acute 01/17/2016  . Thoracic outlet syndrome 11/03/2014  . Urinary urgency 12/15/2015     Past Surgical History:  Procedure Laterality Date  . ABDOMINAL HYSTERECTOMY    . CYSTOSCOPY  07/23/2012   Procedure: CYSTOSCOPY;  Surgeon: Marvene Staff, MD;  Location: [facility];  Service: Gynecology;  Laterality: N/A;  . DILATION AND CURETTAGE OF UTERUS     Lysis of Adhesions  . ROBOTIC ASSISTED LAPAROSCOPIC LYSIS OF ADHESION  07/23/2012   Procedure: ROBOTIC ASSISTED LAPAROSCOPIC LYSIS OF ADHESION;  Surgeon: Marvene Staff, MD;  Location: [facility];  Service: Gynecology;  Laterality: N/A;  . ROBOTIC ASSISTED SALPINGO OOPHERECTOMY  07/23/2012   Procedure: ROBOTIC ASSISTED SALPINGO OOPHERECTOMY;  Surgeon: Marvene Staff, MD;  Location: [facility];  Service: Gynecology;  Laterality: Bilateral;    Family History  Problem Relation Age of Onset  . GER disease Mother   . GER disease Father   . Heart disease Father        father has "heart problems"  . Anemia Father   . Asthma Other   . Hypertension Other   . Diabetes Other     Social History   Socioeconomic History  . Marital status: Single    Spouse name: Not on file  . Number of children: Not on file  . Years of education: Not on file  . Highest education level: Not on file  Occupational History  . Not on file  Tobacco Use  . Smoking status: Former Research scientist (life sciences)  . Smokeless tobacco: Never Used  Substance and Sexual Activity  . Alcohol use: No  . Drug use: No  .  Sexual activity: Not on file  Other Topics Concern  . Not on file  Social History Narrative  . Not on file   Social Determinants of Health   Financial Resource Strain:   . Difficulty of Paying Living Expenses: Not on file  Food Insecurity:   . Worried About Charity fundraiser in the Last Year: Not on file  . Ran Out of Food in the Last Year: Not on file  Transportation Needs:   . Lack of Transportation (Medical): Not on file  . Lack of Transportation (Non-Medical): Not on file  Physical Activity:   . Days of Exercise per Week: Not on file  .  Minutes of Exercise per Session: Not on file  Stress:   . Feeling of Stress : Not on file  Social Connections:   . Frequency of Communication with Friends and Family: Not on file  . Frequency of Social Gatherings with Friends and Family: Not on file  . Attends Religious Services: Not on file  . Active Member of Clubs or Organizations: Not on file  . Attends Archivist Meetings: Not on file  . Marital Status: Not on file  Intimate Partner Violence:   . Fear of Current or Ex-Partner: Not on file  . Emotionally Abused: Not on file  . Physically Abused: Not on file  . Sexually Abused: Not on file    Outpatient Medications Prior to Visit  Medication Sig Dispense Refill  . Accu-Chek FastClix Lancets MISC USE AS DIRECTED TWICE DAILY TO CHECK BLOOD SUGAR. DX E11.9 102 each 6  . ACCU-CHEK GUIDE test strip USE AS DIRECTED TWICE DAILY TO CHECK BLOOD SUGAR. DX E11.9 100 strip 6  . albuterol (PROVENTIL HFA;VENTOLIN HFA) 108 (90 Base) MCG/ACT inhaler Inhale 2 puffs into the lungs every 6 (six) hours as needed for wheezing or shortness of breath. 1 Inhaler 6  . aspirin 81 MG tablet Take 81 mg by mouth daily.    Marland Kitchen atorvastatin (LIPITOR) 20 MG tablet TAKE 1 TABLET (20 MG TOTAL) BY MOUTH DAILY. 90 tablet 3  . Blood Glucose Monitoring Suppl (ACCU-CHEK GUIDE) w/Device KIT Inject 1 kit as directed 2 (two) times daily. Use to check blood sugar.  DX E11.9 1 kit 0  . carvedilol (COREG) 3.125 MG tablet TAKE 1 TABLET BY MOUTH TWICE A DAY WITH A MEAL. 60 tablet 0  . cetirizine (ZYRTEC) 10 MG tablet Take 1 tablet (10 mg total) by mouth 2 (two) times daily. 180 tablet 1  . famotidine (SM ACID REDUCER MAX ST) 20 MG tablet Take 1 tablet (20 mg total) by mouth 2 (two) times daily. 180 tablet 1  . glimepiride (AMARYL) 4 MG tablet TAKE 1 TABLET (4 MG TOTAL) BY MOUTH 2 TIMES DAILY. 180 tablet 0  . hydrochlorothiazide (HYDRODIURIL) 25 MG tablet TAKE 1 TABLET (25 MG TOTAL) BY MOUTH DAILY. 30 tablet 0  . Insulin  Glargine (LANTUS SOLOSTAR) 100 UNIT/ML Solostar Pen Inject 34 Units into the skin daily. Increase 2 units every 3 days until fasting blood sugars approach 100. 5 pen 3  . JANUVIA 100 MG tablet TAKE 1 TABLET BY MOUTH DAILY. 30 tablet 5  . meclizine (ANTIVERT) 25 MG tablet Take 1 tablet (25 mg total) by mouth 3 (three) times daily as needed for dizziness. 30 tablet 0  . meloxicam (MOBIC) 7.5 MG tablet Take 1 tablet (7.5 mg total) by mouth daily. 14 tablet 0  . metFORMIN (GLUCOPHAGE-XR) 500 MG 24 hr tablet Take 1 tablet (500  mg total) by mouth daily with breakfast. TAKE 2 TABLETS BY MOUTH TWICE DAILY AND 1 TABLET AT NOON 150 tablet 5  . nitroGLYCERIN (NITROSTAT) 0.4 MG SL tablet Place 1 tablet (0.4 mg total) under the tongue every 5 (five) minutes x 3 doses as needed for chest pain. 40 tablet 1  . pantoprazole (PROTONIX) 40 MG tablet TAKE 1 TABLET (40 MG TOTAL) BY MOUTH DAILY. 30 tablet 5  . potassium chloride SA (KLOR-CON) 20 MEQ tablet TAKE 1 TABLET (20 MEQ TOTAL) BY MOUTH DAILY. 30 tablet 0  . promethazine (PHENERGAN) 25 MG tablet Take 1 tablet (25 mg total) by mouth every 8 (eight) hours as needed for nausea or vomiting. 20 tablet 0  . tiZANidine (ZANAFLEX) 4 MG tablet Take 1 tablet (4 mg total) by mouth every 6 (six) hours as needed for muscle spasms. 40 tablet 1  . traMADol (ULTRAM) 50 MG tablet TAKE 2 TABLETS BY MOUTH TWICE DAILY AS NEEDED 60 tablet 0  . UNIFINE PENTIPS 31G X 5 MM MISC USE AS DIRECTED 2 NEEDLES A DAY WITH VICTOZA & LEVEMIR 100 each 0  . VICTOZA 18 MG/3ML SOPN INJECT 0.3 MLS (1.8 MG TOTAL) INTO THE SKIN DAILY. 9 mL 3  . hyoscyamine (LEVSIN SL) 0.125 MG SL tablet Place 1 tablet (0.125 mg total) under the tongue every 4 (four) hours as needed. 30 tablet 0  . Lancet Device MISC by Does not apply route. Use to test blood sugar three times daily     . naproxen (NAPROSYN) 500 MG tablet     . NON FORMULARY [employer]  Anti-fungal (nail)-#1     No facility-administered  medications prior to visit.    Allergies  Allergen Reactions  . Wilder Glade [Dapagliflozin] Hives    Review of Systems  Constitutional: Positive for malaise/fatigue. Negative for fever.  HENT: Negative for congestion.   Eyes: Negative for blurred vision.  Respiratory: Negative for shortness of breath.   Cardiovascular: Negative for chest pain, palpitations and leg swelling.  Gastrointestinal: Negative for abdominal pain, blood in stool and nausea.  Genitourinary: Negative for dysuria and frequency.  Musculoskeletal: Positive for joint pain. Negative for falls.  Skin: Negative for rash.  Neurological: Negative for dizziness, loss of consciousness and headaches.  Endo/Heme/Allergies: Negative for environmental allergies.  Psychiatric/Behavioral: Negative for depression. The patient is not nervous/anxious.        Objective:    Physical Exam Constitutional:      Appearance: Normal appearance. She is not ill-appearing.  HENT:     Head: Normocephalic and atraumatic.     Nose: Nose normal.  Eyes:     General:        Right eye: No discharge.        Left eye: No discharge.  Pulmonary:     Effort: Pulmonary effort is normal.  Neurological:     Mental Status: She is alert and oriented to person, place, and time.  Psychiatric:        Behavior: Behavior normal.     BP 113/74   Pulse 89   Temp (!) 94 F (34.4 C)  Wt Readings from Last 3 Encounters:  07/09/19 247 lb (112 kg)  03/05/19 235 lb (106.6 kg)  02/19/19 235 lb (106.6 kg)    Diabetic Foot Exam - Simple   No data filed     Lab Results  Component Value Date   WBC 11.2 (H) 07/18/2019   HGB 13.9 07/18/2019   HCT 42.8 07/18/2019   PLT  418.0 (H) 07/18/2019   GLUCOSE 156 (H) 07/18/2019   CHOL 207 (H) 07/18/2019   TRIG 185.0 (H) 07/18/2019   HDL 35.90 (L) 07/18/2019   LDLDIRECT 100.0 08/03/2017   LDLCALC 134 (H) 07/18/2019   ALT 15 07/18/2019   AST 13 07/18/2019   NA 140 07/18/2019   K 4.2 07/18/2019   CL 100  07/18/2019   CREATININE 0.71 07/18/2019   BUN 14 07/18/2019   CO2 32 07/18/2019   TSH 1.21 07/18/2019   HGBA1C 9.7 (H) 07/18/2019   MICROALBUR 0.9 11/12/2015    Lab Results  Component Value Date   TSH 1.21 07/18/2019   Lab Results  Component Value Date   WBC 11.2 (H) 07/18/2019   HGB 13.9 07/18/2019   HCT 42.8 07/18/2019   MCV 91.3 07/18/2019   PLT 418.0 (H) 07/18/2019   Lab Results  Component Value Date   NA 140 07/18/2019   K 4.2 07/18/2019   CO2 32 07/18/2019   GLUCOSE 156 (H) 07/18/2019   BUN 14 07/18/2019   CREATININE 0.71 07/18/2019   BILITOT 0.5 07/18/2019   ALKPHOS 98 07/18/2019   AST 13 07/18/2019   ALT 15 07/18/2019   PROT 7.2 07/18/2019   ALBUMIN 4.1 07/18/2019   CALCIUM 9.7 07/18/2019   GFR 102.77 07/18/2019   Lab Results  Component Value Date   CHOL 207 (H) 07/18/2019   Lab Results  Component Value Date   HDL 35.90 (L) 07/18/2019   Lab Results  Component Value Date   LDLCALC 134 (H) 07/18/2019   Lab Results  Component Value Date   TRIG 185.0 (H) 07/18/2019   Lab Results  Component Value Date   CHOLHDL 6 07/18/2019   Lab Results  Component Value Date   HGBA1C 9.7 (H) 07/18/2019       Assessment & Plan:   Problem List Items Addressed This Visit    Diabetes mellitus type 2 in obese ([place]) - Primary    hgba1c unacceptable, minimize simple carbs. Increase exercise as tolerated. Continue current meds. She agrees to return for updated labs and then we will make adjustments to meds. She also agrees to referral to endocrinology once they are established in the office.       Relevant Orders   CBC   Hemoglobin A1c   TSH   Hyperlipidemia, mixed    Tolerating statin, encouraged heart healthy diet, avoid trans fats, minimize simple carbs and saturated fats. Increase exercise as tolerated      Relevant Orders   CBC   Lipid panel   TSH   Essential hypertension    Monitor and report any concerns, no changes to meds. Encouraged heart  healthy diet such as the DASH diet and exercise as tolerated.       Relevant Orders   CBC   Comprehensive metabolic panel   TSH   Bilateral foot pain    She has switched to Crocs and that has helped some. She is following with podiatry as well. Reminded to stretch and use topical treatments.          I have discontinued Mischell Oki's Lancet Device, naproxen, NON FORMULARY, and hyoscyamine. I am also having her start on fluconazole. Additionally, I am having her maintain her aspirin, meclizine, promethazine, albuterol, Accu-Chek Guide, nitroGLYCERIN, traMADol, cetirizine, tiZANidine, pantoprazole, potassium chloride SA, Lantus SoloStar, metFORMIN, Januvia, Accu-Chek Guide, Accu-Chek FastClix Lancets, glimepiride, Victoza, Unifine Pentips, famotidine, meloxicam, atorvastatin, carvedilol, and hydrochlorothiazide.  Meds ordered this encounter  Medications  . fluconazole (  DIFLUCAN) 150 MG tablet    Sig: Take 1 tablet (150 mg total) by mouth once for 1 dose.    Dispense:  1 tablet    Refill:  0    I discussed the assessment and treatment plan with the patient. The patient was provided an opportunity to ask questions and all were answered. The patient agreed with the plan and demonstrated an understanding of the instructions.   The patient was advised to call back or seek an in-person evaluation if the symptoms worsen or if the condition fails to improve as anticipated.  I provided 25 minutes of non-face-to-face time during this encounter.   Penni Homans, MD

## 2019-12-02 NOTE — Telephone Encounter (Signed)
Eileen Richards can you send in new monitor, lancets and test strips  Please advise

## 2019-12-03 ENCOUNTER — Ambulatory Visit: Payer: 59 | Admitting: Podiatry

## 2019-12-03 MED ORDER — FREESTYLE LANCETS MISC: 1 refills | Status: DC

## 2019-12-03 MED ORDER — FREESTYLE LITE TEST VI STRP: ORAL_STRIP | 1 refills | Status: DC

## 2019-12-03 MED ORDER — FREESTYLE FREEDOM LITE W/DEVICE KIT: PACK | 0 refills | Status: DC

## 2019-12-03 MED FILL — FREESTYLE LITE TEST STRIP: 50 days supply | Qty: 100 | Fill #0

## 2019-12-03 MED FILL — FREESTYLE FREEDOM LITE METE: W/DEVICE | 30 days supply | Qty: 1 | Fill #0

## 2019-12-03 MED FILL — FREESTYLE LANCETS: 50 days supply | Qty: 100 | Fill #0

## 2019-12-05 ENCOUNTER — Other Ambulatory Visit: Payer: Self-pay

## 2019-12-05 ENCOUNTER — Other Ambulatory Visit (INDEPENDENT_AMBULATORY_CARE_PROVIDER_SITE_OTHER): Payer: 59

## 2019-12-05 DIAGNOSIS — E782 Mixed hyperlipidemia: Secondary | ICD-10-CM | POA: Diagnosis not present

## 2019-12-05 DIAGNOSIS — E669 Obesity, unspecified: Secondary | ICD-10-CM | POA: Diagnosis not present

## 2019-12-05 DIAGNOSIS — E1169 Type 2 diabetes mellitus with other specified complication: Secondary | ICD-10-CM

## 2019-12-05 DIAGNOSIS — I10 Essential (primary) hypertension: Secondary | ICD-10-CM

## 2019-12-05 LAB — COMPREHENSIVE METABOLIC PANEL
ALT: 24 U/L (ref 0–35)
AST: 19 U/L (ref 0–37)
Albumin: 3.7 g/dL (ref 3.5–5.2)
Alkaline Phosphatase: 98 U/L (ref 39–117)
BUN: 16 mg/dL (ref 6–23)
CO2: 27 mEq/L (ref 19–32)
Calcium: 9.5 mg/dL (ref 8.4–10.5)
Chloride: 103 mEq/L (ref 96–112)
Creatinine, Ser: 0.73 mg/dL (ref 0.40–1.20)
GFR: 99.4 mL/min (ref >60.00)
Glucose, Bld: 253 mg/dL — ABNORMAL HIGH (ref 70–99)
Potassium: 4.4 mEq/L (ref 3.5–5.1)
Sodium: 135 mEq/L (ref 135–145)
Total Bilirubin: 0.3 mg/dL (ref 0.2–1.2)
Total Protein: 6.7 g/dL (ref 6.0–8.3)

## 2019-12-05 LAB — LIPID PANEL
Cholesterol: 178 mg/dL (ref 0–200)
HDL: 33.2 mg/dL — ABNORMAL LOW (ref >39.00)
LDL Cholesterol: 111 mg/dL — ABNORMAL HIGH (ref 0–99)
NonHDL: 144.5
Total CHOL/HDL Ratio: 5
Triglycerides: 167 mg/dL — ABNORMAL HIGH (ref 0.0–149.0)
VLDL: 33.4 mg/dL (ref 0.0–40.0)

## 2019-12-05 LAB — CBC
HCT: 41.2 % (ref 36.0–46.0)
Hemoglobin: 13.6 g/dL (ref 12.0–15.0)
MCHC: 33.1 g/dL (ref 30.0–36.0)
MCV: 91.1 fl (ref 78.0–100.0)
Platelets: 386 10*3/uL (ref 150.0–400.0)
RBC: 4.52 Mil/uL (ref 3.87–5.11)
RDW: 14.2 % (ref 11.5–15.5)
WBC: 9.6 10*3/uL (ref 4.0–10.5)

## 2019-12-05 LAB — TSH: TSH: 1.43 u[IU]/mL (ref 0.35–4.50)

## 2019-12-05 LAB — HEMOGLOBIN A1C: Hgb A1c MFr Bld: 10.5 % — ABNORMAL HIGH (ref 4.6–6.5)

## 2019-12-16 ENCOUNTER — Other Ambulatory Visit: Payer: Self-pay | Admitting: Family Medicine

## 2019-12-16 MED FILL — CARVEDILOL 3.125 MG TABLET: 3.125 | 30 days supply | Qty: 60 | Fill #0

## 2019-12-16 MED FILL — HYDROCHLOROTHIAZIDE 25 MG T: 25 | 30 days supply | Qty: 30 | Fill #0

## 2019-12-16 MED FILL — JANUVIA 100 MG TABLET: 100 | 30 days supply | Qty: 30 | Fill #3

## 2019-12-16 MED FILL — POTASSIUM CHLORIDE CRYS ER: 20 | 30 days supply | Qty: 30 | Fill #0

## 2019-12-16 MED FILL — FAMOTIDINE 20 MG TABS: 20 | 90 days supply | Qty: 180 | Fill #0

## 2019-12-16 MED FILL — METFORMIN HCL ER 500 MG TB2: 500 | 30 days supply | Qty: 150 | Fill #2

## 2019-12-16 MED FILL — LANTUS SOLOSTAR 100 UNITS/M: 100 | 44 days supply | Qty: 15 | Fill #3

## 2019-12-17 ENCOUNTER — Telehealth: Payer: Self-pay | Admitting: *Deleted

## 2019-12-17 MED ORDER — LEVEMIR FLEXTOUCH 100 UNIT/ML ~~LOC~~ SOPN: 42.0000 [IU] | PEN_INJECTOR | Freq: Every day | SUBCUTANEOUS | 3 refills | Status: DC

## 2019-12-17 NOTE — Telephone Encounter (Signed)
Insurance will no longer cover lantus on 12/19/19.  We are changing to Levemir 40 units [place] qd 5pens 3 refills.  Spoke with patient about change she stated that she checked her glucose this morning and it was 266 and she has now moved up to 42 units daily.  She will call back next week to let us know how many units she is at.    She just called in a refill for the lantus and will be using that a little past the 12/19/19 change date.    Will send in for 42 units daily.

## 2019-12-18 ENCOUNTER — Telehealth: Payer: Self-pay | Admitting: *Deleted

## 2019-12-18 NOTE — Telephone Encounter (Signed)
Ok to switch to [employer] with same sig as Levemir.

## 2019-12-18 NOTE — Telephone Encounter (Signed)
Insurance will not cover Lantus after 4/1 so we changed to [employer]  Patient copay is $96.  Pharmacy states that [employer] will $5 with a coupon.    Can we change to [place]?

## 2019-12-19 MED ORDER — BASAGLAR KWIKPEN 100 UNIT/ML ~~LOC~~ SOPN: PEN_INJECTOR | SUBCUTANEOUS | 1 refills | Status: DC

## 2019-12-19 NOTE — Telephone Encounter (Signed)
Rx sent in

## 2019-12-26 ENCOUNTER — Other Ambulatory Visit: Payer: Self-pay | Admitting: Family Medicine

## 2019-12-26 NOTE — Telephone Encounter (Signed)
Last filled 02/01/18

## 2019-12-27 MED ORDER — MECLIZINE HCL 25 MG PO TABS: 25.0000 mg | ORAL_TABLET | Freq: Three times a day (TID) | ORAL | 0 refills | Status: DC | PRN

## 2019-12-27 MED ORDER — TRAMADOL HCL 50 MG PO TABS: 50.0000 mg | ORAL_TABLET | Freq: Two times a day (BID) | ORAL | 0 refills | Status: DC | PRN

## 2019-12-27 MED FILL — MECLIZINE 25 MG TABLET: 25 | 10 days supply | Qty: 30 | Fill #0

## 2019-12-27 MED FILL — traMADol HCL 50 MG TABS: 50 | 30 days supply | Qty: 60 | Fill #0

## 2019-12-30 ENCOUNTER — Other Ambulatory Visit: Payer: Self-pay | Admitting: Family Medicine

## 2019-12-30 MED FILL — UNIFINE PENTIPS 31GX3/16: 31G X 5 MM | 50 days supply | Qty: 100 | Fill #0

## 2019-12-30 MED FILL — VICTOZA 18 MG/3 ML INJECT P: 18 | 30 days supply | Qty: 9 | Fill #3

## 2020-01-22 ENCOUNTER — Other Ambulatory Visit: Payer: Self-pay | Admitting: Family Medicine

## 2020-01-22 MED FILL — JANUVIA 100 MG TABLET: 100 | 30 days supply | Qty: 30 | Fill #4

## 2020-01-22 MED FILL — HYDROCHLOROTHIAZIDE 25 MG T: 25 | 90 days supply | Qty: 90 | Fill #0

## 2020-01-22 MED FILL — BASAGLAR 100 UNIT/ML KWIKPE: 100 | 36 days supply | Qty: 15 | Fill #0

## 2020-01-22 MED FILL — PANTOPRAZOLE SOD DR 40 MG T: 40 | 90 days supply | Qty: 90 | Fill #0

## 2020-01-22 MED FILL — GLIMEPIRIDE 4 MG TABLET: 4 | 90 days supply | Qty: 180 | Fill #0

## 2020-01-22 MED FILL — CARVEDILOL 3.125 MG TABLET: 3.125 | 90 days supply | Qty: 180 | Fill #0

## 2020-02-03 ENCOUNTER — Other Ambulatory Visit: Payer: Self-pay | Admitting: Family Medicine

## 2020-02-03 MED FILL — VICTOZA 18 MG/3 ML INJECT P: 18 | 30 days supply | Qty: 9 | Fill #0

## 2020-02-10 DIAGNOSIS — H0102B Squamous blepharitis left eye, upper and lower eyelids: Secondary | ICD-10-CM | POA: Diagnosis not present

## 2020-02-10 DIAGNOSIS — H40013 Open angle with borderline findings, low risk, bilateral: Secondary | ICD-10-CM | POA: Diagnosis not present

## 2020-02-10 DIAGNOSIS — H0102A Squamous blepharitis right eye, upper and lower eyelids: Secondary | ICD-10-CM | POA: Diagnosis not present

## 2020-02-10 DIAGNOSIS — H16223 Keratoconjunctivitis sicca, not specified as Sjogren's, bilateral: Secondary | ICD-10-CM | POA: Diagnosis not present

## 2020-02-10 DIAGNOSIS — H5203 Hypermetropia, bilateral: Secondary | ICD-10-CM | POA: Diagnosis not present

## 2020-02-10 DIAGNOSIS — H25013 Cortical age-related cataract, bilateral: Secondary | ICD-10-CM | POA: Diagnosis not present

## 2020-02-10 DIAGNOSIS — H35033 Hypertensive retinopathy, bilateral: Secondary | ICD-10-CM | POA: Diagnosis not present

## 2020-02-10 DIAGNOSIS — H18413 Arcus senilis, bilateral: Secondary | ICD-10-CM | POA: Diagnosis not present

## 2020-02-10 DIAGNOSIS — E119 Type 2 diabetes mellitus without complications: Secondary | ICD-10-CM | POA: Diagnosis not present

## 2020-02-10 LAB — HM DIABETES EYE EXAM

## 2020-02-11 ENCOUNTER — Encounter: Payer: Self-pay | Admitting: *Deleted

## 2020-02-14 ENCOUNTER — Other Ambulatory Visit: Payer: Self-pay | Admitting: Family Medicine

## 2020-02-14 MED FILL — METFORMIN HCL ER 500 MG TB2: 500 | 30 days supply | Qty: 150 | Fill #3

## 2020-03-02 ENCOUNTER — Other Ambulatory Visit: Payer: Self-pay | Admitting: Family Medicine

## 2020-03-02 MED FILL — UNIFINE PENTIPS 31GX3/16: 31G X 5 MM | 50 days supply | Qty: 100 | Fill #0

## 2020-03-02 MED FILL — VICTOZA 18 MG/3 ML INJECT P: 18 | 29 days supply | Qty: 9 | Fill #1

## 2020-03-12 ENCOUNTER — Ambulatory Visit: Payer: 59 | Admitting: Family Medicine

## 2020-03-31 ENCOUNTER — Other Ambulatory Visit: Payer: Self-pay | Admitting: Family Medicine

## 2020-03-31 MED FILL — ATORVASTATIN 20 MG TABLET: 20 | 90 days supply | Qty: 90 | Fill #1

## 2020-03-31 MED FILL — BASAGLAR 100 UNIT/ML KWIKPE: 100 | 36 days supply | Qty: 15 | Fill #0

## 2020-03-31 MED FILL — METFORMIN HCL ER 500 MG TB2: 500 | 30 days supply | Qty: 150 | Fill #4

## 2020-03-31 MED FILL — JANUVIA 100 MG TABLET: 100 | 30 days supply | Qty: 30 | Fill #0

## 2020-04-01 ENCOUNTER — Other Ambulatory Visit: Payer: Self-pay | Admitting: Family Medicine

## 2020-04-02 ENCOUNTER — Telehealth: Payer: Self-pay | Admitting: *Deleted

## 2020-04-02 MED ORDER — VICTOZA 18 MG/3ML ~~LOC~~ SOPN: PEN_INJECTOR | SUBCUTANEOUS | 3 refills | Status: DC

## 2020-04-02 MED FILL — VICTOZA 18 MG/3 ML INJECT P: 18 | 30 days supply | Qty: 9 | Fill #0

## 2020-04-02 MED FILL — POTASSIUM CHLORIDE CRYS ER: 20 | 30 days supply | Qty: 30 | Fill #0

## 2020-04-02 NOTE — Telephone Encounter (Signed)
Received Victoza request from [place]. Refill sent.

## 2020-04-21 ENCOUNTER — Ambulatory Visit: Payer: 59 | Admitting: Family Medicine

## 2020-04-21 ENCOUNTER — Other Ambulatory Visit: Payer: Self-pay

## 2020-04-21 ENCOUNTER — Encounter: Payer: Self-pay | Admitting: Family Medicine

## 2020-04-21 ENCOUNTER — Other Ambulatory Visit: Payer: Self-pay | Admitting: Family Medicine

## 2020-04-21 VITALS — BP 120/72 | HR 103 | Temp 97.5°F | Ht 64.0 in | Wt 246.4 lb

## 2020-04-21 DIAGNOSIS — E782 Mixed hyperlipidemia: Secondary | ICD-10-CM

## 2020-04-21 DIAGNOSIS — I10 Essential (primary) hypertension: Secondary | ICD-10-CM

## 2020-04-21 DIAGNOSIS — M79671 Pain in right foot: Secondary | ICD-10-CM | POA: Diagnosis not present

## 2020-04-21 DIAGNOSIS — M25559 Pain in unspecified hip: Secondary | ICD-10-CM | POA: Diagnosis not present

## 2020-04-21 DIAGNOSIS — E1169 Type 2 diabetes mellitus with other specified complication: Secondary | ICD-10-CM

## 2020-04-21 MED ORDER — GABAPENTIN 300 MG PO CAPS: 300.0000 mg | ORAL_CAPSULE | Freq: Three times a day (TID) | ORAL | 1 refills | Status: DC

## 2020-04-21 MED FILL — GABAPENTIN 300 MG CAPSULE: 300 | 30 days supply | Qty: 90 | Fill #0

## 2020-04-21 NOTE — Patient Instructions (Signed)
Call Dr. Jacqualyn Posey.   Ice/cold pack over area for 10-15 min twice daily.  Heat (pad or rice pillow in microwave) over affected area, 10-15 minutes twice daily.   Gluteus Medius Syndrome Rehab It is normal to feel mild stretching, pulling, tightness, or discomfort as you do these exercises, but you should stop right away if you feel sudden pain or your pain gets worse.   Stretching and range of motion exercise This exercise warms up your muscles and joints and improves the movement and flexibility of your hip and pelvis. This exercise also helps to relieve pain and stiffness. Exercise A: Lunge (hip flexor stretch)     1. Kneel on the floor on your left / right knee. Bend your other knee so it is directly over your ankle. 2. Keep good posture with your head over your shoulders. Tuck your tailbone underneath you. This will prevent your back from arching too much. 3. You should feel a gentle stretch in the front of your thigh or hip. If you do not feel a stretch, slowly lunge forward with your chest up. 4. Hold this position for 30 seconds. 5. Slowly return to the starting position. Repeat 2 times. Complete this exercise 3 times per week. Strengthening exercises These exercises build strength and endurance in your hip and pelvis. Endurance is the ability to use your muscles for a long time, even after they get tired. Exercise B: Bridge (hip extensors)    1. Lie on your back on a firm surface with your knees bent and your feet flat on the floor. 2. Tighten your buttocks muscles and lift your bottom off the floor until the trunk of your body is level with your thighs. ? You should feel the muscles working in your buttocks and the back of your thighs. If this exercise is too easy, cross your arms over your chest or lift one leg while your bottom is up off the floor. ? Do not arch your back. 3. Hold this position for 3 seconds. 4. Slowly lower your hips to the starting position. 5. Let your  muscles relax completely between repetitions. Repeat 2 times. Complete this exercise 3 times per week. Exercise C: Straight leg raises (hip abductors)    1. Lie on your side with your left / right leg in the top position. Lie so your head, shoulder, knee, and hip line up. Bend your bottom knee to help you balance. 2. Lift your top leg up 4-6 inches (10-15 cm), keeping your toes pointed straight ahead. 3. Hold this position for 2 seconds. 4. Slowly lower your leg to the starting position and let your muscles relax completely. Repeat for a total of 10 repetitions. Repeat 2 times. Complete this exercise 3 times per week. Exercise D: Hip abductors and external rotators, quadruped 1. Get on your hands and knees on a firm, lightly padded surface. Your hands should be directly below your shoulders, and your knees should be directly below your hips. 2. Lift your left / right knee out to the side. Keep your knee bent. Do not twist your body. 3. Hold this position for 3 seconds. 4. Slowly lower your leg. Repeat for a total of 10 repetitions.  Repeat 2 times. Complete this exercise 3 times per week. Exercise E: Single leg stand 1. Stand near a counter or door frame to hold onto as needed. It is helpful to look in a mirror for this exercise so you can watch your hip. 2. Squeeze your left /  right buttock muscles then lift up your other foot. Do not let your left / righthip push out to the side. 3. Hold this position for 3 seconds. Repeat for a total of 10 repetitions. Repeat 2 times. Complete this exercise 3 times per week. Make sure you discuss any questions you have with your health care provider. Document Released: 09/05/2005 Document Revised: 05/12/2016 Document Reviewed: 08/18/2015 Elsevier Interactive Patient Education  2018 [place] Band Syndrome Rehab It is normal to feel mild stretching, pulling, tightness, or discomfort as you do these exercises, but you should stop right  away if you feel sudden pain or your pain gets worse.  Stretching and range of motion exercises These exercises warm up your muscles and joints and improve the movement and flexibility of your hip and pelvis. Exercise A: Quadriceps, prone    1. Lie on your abdomen on a firm surface, such as a bed or padded floor. 2. Bend your left / right knee and hold your ankle. If you cannot reach your ankle or pant leg, loop a belt around your foot and grab the belt instead. 3. Gently pull your heel toward your buttocks. Your knee should not slide out to the side. You should feel a stretch in the front of your thigh and knee. 4. Hold this position for 30 seconds. Repeat 2 times. Complete this stretch 3 times per week. Exercise B: Iliotibial band    1. Lie on your side with your left / right leg in the top position. 2. Bend both of your knees and grab your left / right ankle. Stretch out your bottom arm to help you balance. 3. Slowly bring your top knee back so your thigh goes behind your trunk. 4. Slowly lower your top leg toward the floor until you feel a gentle stretch on the outside of your left / right hip and thigh. If you do not feel a stretch and your knee will not fall farther, place the heel of your other foot on top of your knee and pull your knee down toward the floor with your foot. 5. Hold this position for 30 seconds. Repeat 2 times. Complete this stretch 3 times per week. Strengthening exercises These exercises build strength and endurance in your hip and pelvis. Endurance is the ability to use your muscles for a long time, even after they get tired. Exercise C: Straight leg raises (hip abductors)     1. Lie on your side with your left / right leg in the top position. Lie so your head, shoulder, knee, and hip line up. You may bend your bottom knee to help you balance. 2. Roll your hips slightly forward so your hips are stacked directly over each other and your left / right knee is facing  forward. 3. Tense the muscles in your outer thigh and lift your top leg 4-6 inches (10-15 cm). 4. Hold this position for 3 seconds. Repeat for a total of 10 reps. 5. Slowly return to the starting position. Let your muscles relax completely before doing another repetition. Repeat 2 times. Complete this exercise 3 times per week. Exercise D: Straight leg raises (hip extensors) 1. Lie on your abdomen on your bed or a firm surface. You can put a pillow under your hips if that is more comfortable. 2. Bend your left / right knee so your foot is straight up in the air. 3. Squeeze your buttock muscles and lift your left / right thigh off the bed.  Do not let your back arch. 4. Tense this muscle as hard as you can without increasing any knee pain. 5. Hold this position for 2 seconds. Repeat for a total of 10 reps 6. Slowly lower your leg to the starting position and allow it to relax completely. Repeat 2 times. Complete this exercise 3 times per week. Exercise E: Hip hike 1. Stand sideways on a bottom step. Stand on your left / right leg with your other foot unsupported next to the step. You can hold onto the railing or wall if needed for balance. 2. Keep your knees straight and your torso square. Then, lift your left / right hip up toward the ceiling. 3. Slowly let your left / right hip lower toward the floor, past the starting position. Your foot should get closer to the floor. Do not lean or bend your knees. Repeat 2 times. Complete this exercise 3 times per week.  Document Released: 09/05/2005 Document Revised: 05/10/2016 Document Reviewed: 08/07/2015 Elsevier Interactive Patient Education  [employer].

## 2020-04-21 NOTE — Progress Notes (Unsigned)
Subjective:  No chief complaint on file.   Eileen Richards is a 59 y.o. female here for follow-up of diabetes.   Matraca's self monitored glucose range is {HH BLOOD SUGARS:109035}.  Patient denies hypoglycemic reactions. She checks her glucose levels {NUMBERS 1-4:211020791} times per day. Patient does require insulin- Basaglar 42 u/d.   Medications include: Metformin XR 1000 mg bid, Victoza 1.8 mg/d, Amaryl 4 mg bid Diet is ***.  Exercise:   Hypertension Patient presents for hypertension follow up. She does monitor home blood pressures. Blood pressures ranging on average from ***'s/***'s. She is compliant with medications- HCTZ 25 mg/d, Coreg 3.125 mg bid. Patient has these side effects of medication: none Diet/exercise as above.   Hyperlipidemia Patient presents for dyslipidemia follow up. Currently being treated with Lipitor 20 mg/d and compliance with treatment thus far has been good. She denies myalgias. Diet/exercise as above.  The patient is not known to have coexisting coronary artery disease.  Past Medical History:  Diagnosis Date  . Achilles tendonitis 10/14/2012  . Asthma    childhood  . Atypical chest pain    normal stress echo 02/2009 -LV size was normal LV global systolic function was normal- Normal wall motion; no LV regional wall motion abnormalities  . Benign paroxysmal positional vertigo 04/26/2015  . Chest pain, atypical 08/24/2013  . Diabetes mellitus   . GERD (gastroesophageal reflux disease)   . Hand pain, left 11/10/2015  . Hyperlipemia   . Hypertension   . Neuropathic pain of ankle, left 04/27/2017  . Onychomycosis 04/17/2014  . Pain of right heel 10/14/2012  . Preventative health care 11/22/2015  . Sinusitis, acute 01/17/2016  . Thoracic outlet syndrome 11/03/2014  . Urinary urgency 12/15/2015     Related testing: Date of retinal exam: Done Pneumovax: {Desc; done/not:10129}  Objective:  There were no vitals taken for this visit. General:  Well  developed, well nourished, in no apparent distress Skin:  Warm, no pallor or diaphoresis Head:  Normocephalic, atraumatic Eyes:  Pupils equal and round, sclera anicteric without injection  Lungs:  CTAB, no access msc use Cardio:  RRR, no bruits, no LE edema Musculoskeletal:  Symmetrical muscle groups noted without atrophy or deformity Neuro:  Sensation intact to pinprick on feet Psych: Age appropriate judgment and insight  Assessment:   No diagnosis found.   Plan:   Counseled on diet and exercise. F/u in *** mo. The patient voiced understanding and agreement to the plan.  [place], DO 04/21/20 3:29 PM

## 2020-04-21 NOTE — Progress Notes (Signed)
Musculoskeletal Exam  Patient: Eileen Richards DOB: April 30, 1961  DOS: 04/21/2020  SUBJECTIVE:  Chief Complaint:   Chief Complaint  Patient presents with   Follow-up    pain    Eileen Richards is a 59 y.o.  female for evaluation and treatment of various pains   R foot pain Onset:  6 months ago. No inj or change in activity.  Location: R inner foot Character:  aching  Progression of issue:  is unchanged Associated symptoms: no swelling, redness or bruising.  Treatment: to date has been prescription NSAIDS. Neurovascular symptoms: no  R hip Around 6 mo ago it started without any inj or change in activity. Describes the pain as burning. No weakness, bruising, swelling, redness. She is having some tingling/cold sensation of anterior thigh. Will get associated back pain that does not seem to be assoc w the hip pain. Nothing in groin area, no catching or locking of joint. Tried ice, tramadol.   Past Medical History:  Diagnosis Date   Achilles tendonitis 10/14/2012   Asthma    childhood   Atypical chest pain    normal stress echo 02/2009 -LV size was normal LV global systolic function was normal- Normal wall motion; no LV regional wall motion abnormalities   Benign paroxysmal positional vertigo 04/26/2015   Chest pain, atypical 08/24/2013   Diabetes mellitus    GERD (gastroesophageal reflux disease)    Hand pain, left 11/10/2015   Hyperlipemia    Hypertension    Neuropathic pain of ankle, left 04/27/2017   Onychomycosis 04/17/2014   Pain of right heel 10/14/2012   Preventative health care 11/22/2015   Sinusitis, acute 01/17/2016   Thoracic outlet syndrome 11/03/2014   Urinary urgency 12/15/2015    Objective: VITAL SIGNS: BP 120/72 (BP Location: Left Arm, Patient Position: Sitting, Cuff Size: Large)    Pulse (!) 103    Temp (!) 97.5 F (36.4 C) (Oral)    Ht 5\' 4"  (1.626 m)    Wt 246 lb 6 oz (111.8 kg)    SpO2 96%    BMI 42.29 kg/m  Constitutional: Well formed, well  developed. No acute distress. Thorax & Lungs: No accessory muscle use Musculoskeletal: hips.    Normal active range of motion: yes.   Normal passive range of motion: yes Tenderness to palpation: yes over distal glute insertion b/l and greater troch Deformity: no Ecchymosis: no Tests positive: none Tests negative: log roll, FABER, FADDIR R foot: TTp over the medial 1st MTP, no excessive warmth or fluctuance Lumbar: No ttp, neg straight leg Neurologic: Normal sensory function. No focal deficits noted. DTR's equal and symmetric in LE's. No clonus. 5/5 strength throughout in LE's.  Psychiatric: Normal mood. Age appropriate judgment and insight. Alert & oriented x 3.    Assessment:  Hip pain - Plan: gabapentin (NEURONTIN) 300 MG capsule  Right foot pain - Plan: gabapentin (NEURONTIN) 300 MG capsule  Plan: Stretches/exercises, heat, ice, Tylenol. Add gabapentin. Will consider PT vs sports med referral for #1. Considered LBP w radiation as well. Not constant enough for injection. F/u with Dr. Jacqualyn Posey for #2 if no improvement.  F/u in 1 mo w Dr. Charlett Blake. The patient voiced understanding and agreement to the plan.   [place], DO 04/21/20  4:42 PM

## 2020-04-30 DIAGNOSIS — Z1231 Encounter for screening mammogram for malignant neoplasm of breast: Secondary | ICD-10-CM | POA: Diagnosis not present

## 2020-04-30 LAB — HM MAMMOGRAPHY

## 2020-05-07 MED FILL — BASAGLAR 100 UNIT/ML KWIKPE: 100 | 36 days supply | Qty: 15 | Fill #1

## 2020-05-07 MED FILL — VICTOZA 18 MG/3 ML INJECT P: 18 | 30 days supply | Qty: 9 | Fill #1

## 2020-05-12 ENCOUNTER — Encounter: Payer: Self-pay | Admitting: Podiatry

## 2020-05-12 ENCOUNTER — Encounter: Payer: Self-pay | Admitting: *Deleted

## 2020-05-12 ENCOUNTER — Ambulatory Visit: Payer: 59 | Admitting: Podiatry

## 2020-05-12 ENCOUNTER — Other Ambulatory Visit: Payer: Self-pay

## 2020-05-12 DIAGNOSIS — M779 Enthesopathy, unspecified: Secondary | ICD-10-CM | POA: Diagnosis not present

## 2020-05-12 DIAGNOSIS — M21611 Bunion of right foot: Secondary | ICD-10-CM | POA: Diagnosis not present

## 2020-05-12 MED ORDER — DICLOFENAC SODIUM 1 % EX GEL: 2.0000 g | Freq: Four times a day (QID) | CUTANEOUS | 2 refills | Status: AC

## 2020-05-12 MED FILL — DICLOFENAC SODIUM 1 % GEL: 1 | 13 days supply | Qty: 100 | Fill #0

## 2020-05-12 NOTE — Patient Instructions (Signed)
Bunion  A bunion is a bump on the base of the big toe that forms when the bones of the big toe joint move out of position. Bunions may be small at first, but they often get larger over time. They can make walking painful. What are the causes? A bunion may be caused by:  Wearing narrow or pointed shoes that force the big toe to press against the other toes.  Abnormal foot development that causes the foot to roll inward (pronate).  Changes in the foot that are caused by certain diseases, such as rheumatoid arthritis or polio.  A foot injury. What increases the risk? The following factors may make you more likely to develop this condition:  Wearing shoes that squeeze the toes together.  Having certain diseases, such as: ? Rheumatoid arthritis. ? Polio. ? Cerebral palsy.  Having family members who have bunions.  Being born with a foot deformity, such as flat feet or low arches.  Doing activities that put a lot of pressure on the feet, such as ballet dancing. What are the signs or symptoms? The main symptom of a bunion is a noticeable bump on the big toe. Other symptoms may include:  Pain.  Swelling around the big toe.  Redness and inflammation.  Thick or hardened skin on the big toe or between the toes.  Stiffness or loss of motion in the big toe.  Trouble with walking. How is this diagnosed? A bunion may be diagnosed based on your symptoms, medical history, and activities. You may have tests, such as:  X-rays. These allow your health care provider to check the position of the bones in your foot and look for damage to your joint. They also help your health care provider determine the severity of your bunion and the best way to treat it.  Joint aspiration. In this test, a sample of fluid is removed from the toe joint. This test may be done if you are in a lot of pain. It helps rule out diseases that cause painful swelling of the joints, such as arthritis. How is this  treated? Treatment depends on the severity of your symptoms. The goal of treatment is to relieve symptoms and prevent the bunion from getting worse. Your health care provider may recommend:  Wearing shoes that have a wide toe box.  Using bunion pads to cushion the affected area.  Taping your toes together to keep them in a normal position.  Placing a device inside your shoe (orthotics) to help reduce pressure on your toe joint.  Taking medicine to ease pain, inflammation, and swelling.  Applying heat or ice to the affected area.  Doing stretching exercises.  Surgery to remove scar tissue and move the toes back into their normal position. This treatment is rare. Follow these instructions at home: Managing pain, stiffness, and swelling   If directed, put ice on the painful area: ? Put ice in a plastic bag. ? Place a towel between your skin and the bag. ? Leave the ice on for 20 minutes, 2-3 times a day. Activity   If directed, apply heat to the affected area before you exercise. Use the heat source that your health care provider recommends, such as a moist heat pack or a heating pad. ? Place a towel between your skin and the heat source. ? Leave the heat on for 20-30 minutes. ? Remove the heat if your skin turns bright red. This is especially important if you are unable to feel pain,   heat, or cold. You may have a greater risk of getting burned.  Do exercises as told by your health care provider. General instructions  Support your toe joint with proper footwear, shoe padding, or taping as told by your health care provider.  Take over-the-counter and prescription medicines only as told by your health care provider.  Keep all follow-up visits as told by your health care provider. This is important. Contact a health care provider if your symptoms:  Get worse.  Do not improve in 2 weeks. Get help right away if you have:  Severe pain and trouble with walking. Summary  A  bunion is a bump on the base of the big toe that forms when the bones of the big toe joint move out of position.  Bunions can make walking painful.  Treatment depends on the severity of your symptoms.  Support your toe joint with proper footwear, shoe padding, or taping as told by your health care provider. This information is not intended to replace advice given to you by your health care provider. Make sure you discuss any questions you have with your health care provider. Document Revised: 03/12/2018 Document Reviewed: 01/16/2018 Elsevier Patient Education  2020 [employer]  

## 2020-05-19 DIAGNOSIS — M21611 Bunion of right foot: Secondary | ICD-10-CM | POA: Insufficient documentation

## 2020-05-19 NOTE — Progress Notes (Signed)
Subjective: 59 year old female presents the office today for concerns of right foot pain. She points on the area but more specifically discomfort. No recent injury or trauma. She states that she was on morphine which helps some. She has occasional burning to the right big toe. She was on gabapentin as well with some help with this issue. She says it hurts to mash the [place]. Denies any systemic complaints such as fevers, chills, nausea, vomiting. No acute changes since last appointment, and no other complaints at this time.   Objective: AAO x3, NAD DP/PT pulses palpable bilaterally, CRT less than 3 seconds Moderate severe bunion deformities present on the right foot there is tenderness palpation on the bunion. No pain with MPJ range of motion. This is edema, erythema. There is no other areas of discomfort identified. MMT 5/5 No pain with calf compression, swelling, warmth, erythema  Assessment: Right foot bunion, capsulitis  Plan: -All treatment options discussed with the patient including all alternatives, risks, complications.  -Continue to inflammatories as needed. Also prescribed Voltaren gel. Dispensed offloading pads. Discussed shoe modifications. Consider surgical intervention if needed. -Patient encouraged to call the office with any questions, concerns, change in symptoms.   Trula Slade DPM

## 2020-05-21 MED FILL — CARVEDILOL 3.125 MG TABLET: 3.125 | 90 days supply | Qty: 180 | Fill #1

## 2020-05-21 MED FILL — FAMOTIDINE 20 MG TABS: 20 | 90 days supply | Qty: 180 | Fill #1

## 2020-05-21 MED FILL — HYDROCHLOROTHIAZIDE 25 MG T: 25 | 90 days supply | Qty: 90 | Fill #1

## 2020-05-21 MED FILL — JANUVIA 100 MG TABLET: 100 | 30 days supply | Qty: 30 | Fill #1

## 2020-05-29 ENCOUNTER — Telehealth: Payer: 59 | Admitting: Family Medicine

## 2020-06-02 ENCOUNTER — Other Ambulatory Visit: Payer: Self-pay | Admitting: Family Medicine

## 2020-06-02 MED FILL — FLUCONAZOLE 150 MG TABS: 150 | 1 days supply | Qty: 1 | Fill #0

## 2020-06-16 ENCOUNTER — Other Ambulatory Visit: Payer: Self-pay | Admitting: Family Medicine

## 2020-06-16 ENCOUNTER — Ambulatory Visit: Payer: 59 | Admitting: Podiatry

## 2020-06-16 MED FILL — VICTOZA 18 MG/3 ML INJECT P: 18 | 30 days supply | Qty: 9 | Fill #2

## 2020-06-17 ENCOUNTER — Other Ambulatory Visit: Payer: Self-pay | Admitting: Family Medicine

## 2020-06-17 MED ORDER — BASAGLAR KWIKPEN 100 UNIT/ML ~~LOC~~ SOPN: 48.0000 [IU] | PEN_INJECTOR | Freq: Every day | SUBCUTANEOUS | 3 refills | Status: DC

## 2020-06-17 MED FILL — BASAGLAR 100 UNIT/ML KWIKPE: 100 | 25 days supply | Qty: 12 | Fill #0

## 2020-06-17 NOTE — Addendum Note (Signed)
Addended byDamita Dunnings D on: 06/17/2020 02:21 PM   Modules accepted: Orders

## 2020-07-01 ENCOUNTER — Telehealth: Payer: Self-pay | Admitting: Family Medicine

## 2020-07-01 NOTE — Telephone Encounter (Signed)
ERROR

## 2020-07-06 ENCOUNTER — Other Ambulatory Visit: Payer: Self-pay | Admitting: Family Medicine

## 2020-07-06 MED FILL — GLIMEPIRIDE 4 MG TABLET: 4 | 90 days supply | Qty: 180 | Fill #1

## 2020-07-06 MED FILL — METFORMIN HCL ER 500 MG TB2: 500 | 90 days supply | Qty: 450 | Fill #0

## 2020-07-06 MED FILL — POTASSIUM CHLORIDE CRYS ER: 20 | 90 days supply | Qty: 90 | Fill #0

## 2020-07-06 MED FILL — JANUVIA 100 MG TABLET: 100 | 30 days supply | Qty: 30 | Fill #2

## 2020-07-13 ENCOUNTER — Other Ambulatory Visit: Payer: Self-pay | Admitting: Family Medicine

## 2020-07-13 MED FILL — BASAGLAR 100 UNIT/ML KWIKPE: 100 | 25 days supply | Qty: 12 | Fill #1

## 2020-07-13 MED FILL — UNIFINE PENTIPS 31GX3/16: 31G X 5 MM | 50 days supply | Qty: 100 | Fill #0

## 2020-07-13 MED FILL — VICTOZA 18 MG/3 ML INJECT P: 18 | 30 days supply | Qty: 9 | Fill #3

## 2020-07-28 ENCOUNTER — Encounter: Payer: Self-pay | Admitting: Family Medicine

## 2020-07-28 ENCOUNTER — Other Ambulatory Visit: Payer: Self-pay

## 2020-07-28 ENCOUNTER — Ambulatory Visit: Payer: 59 | Admitting: Family Medicine

## 2020-07-28 ENCOUNTER — Other Ambulatory Visit (HOSPITAL_COMMUNITY): Payer: Self-pay | Admitting: Family Medicine

## 2020-07-28 VITALS — BP 126/74 | HR 89 | Temp 98.4°F | Resp 18 | Wt 248.2 lb

## 2020-07-28 DIAGNOSIS — N952 Postmenopausal atrophic vaginitis: Secondary | ICD-10-CM

## 2020-07-28 DIAGNOSIS — E1169 Type 2 diabetes mellitus with other specified complication: Secondary | ICD-10-CM

## 2020-07-28 DIAGNOSIS — E782 Mixed hyperlipidemia: Secondary | ICD-10-CM

## 2020-07-28 DIAGNOSIS — I10 Essential (primary) hypertension: Secondary | ICD-10-CM

## 2020-07-28 DIAGNOSIS — E669 Obesity, unspecified: Secondary | ICD-10-CM

## 2020-07-28 MED ORDER — CLOBETASOL PROP-LEVOCETIRIZINE 0.05-2 % EX SHAM: 1.0000 | MEDICATED_SHAMPOO | Freq: Every evening | CUTANEOUS | 2 refills | Status: DC | PRN

## 2020-07-28 MED ORDER — CLOBETASOL PROPIONATE 0.05 % EX OINT: 1.0000 "application " | TOPICAL_OINTMENT | Freq: Two times a day (BID) | CUTANEOUS | 2 refills | Status: DC

## 2020-07-28 MED ORDER — CLOBETASOL PROPIONATE 0.05 % EX OINT: 1.0000 "application " | TOPICAL_OINTMENT | Freq: Every day | CUTANEOUS | 1 refills | Status: DC | PRN

## 2020-07-28 MED ORDER — BLOOD GLUCOSE MONITOR KIT: PACK | 0 refills | Status: DC

## 2020-07-28 MED FILL — FREESTYLE LITE TEST STRIP: 25 days supply | Qty: 100 | Fill #0

## 2020-07-28 MED FILL — CLOBETASOL PROPIONATE 0.05: 0.05 | 30 days supply | Qty: 60 | Fill #0

## 2020-07-28 MED FILL — FREESTYLE LANCETS: 25 days supply | Qty: 100 | Fill #0

## 2020-07-28 NOTE — Patient Instructions (Signed)
Carbohydrate Counting for Diabetes Mellitus, Adult  Carbohydrate counting is a method of keeping track of how many carbohydrates you eat. Eating carbohydrates naturally increases the amount of sugar (glucose) in the blood. Counting how many carbohydrates you eat helps keep your blood glucose within normal limits, which helps you manage your diabetes (diabetes mellitus). It is important to know how many carbohydrates you can safely have in each meal. This is different for every person. A diet and nutrition specialist (registered dietitian) can help you make a meal plan and calculate how many carbohydrates you should have at each meal and snack. Carbohydrates are found in the following foods:  Grains, such as breads and cereals.  Dried beans and soy products.  Starchy vegetables, such as potatoes, peas, and corn.  Fruit and fruit juices.  Milk and yogurt.  Sweets and snack foods, such as cake, cookies, candy, chips, and soft drinks. How do I count carbohydrates? There are two ways to count carbohydrates in food. You can use either of the methods or a combination of both. Reading "Nutrition Facts" on packaged food The "Nutrition Facts" list is included on the labels of almost all packaged foods and beverages in the [place] It includes:  The serving size.  Information about nutrients in each serving, including the grams (g) of carbohydrate per serving. To use the "Nutrition Facts":  Decide how many servings you will have.  Multiply the number of servings by the number of carbohydrates per serving.  The resulting number is the total amount of carbohydrates that you will be having. Learning standard serving sizes of other foods When you eat carbohydrate foods that are not packaged or do not include "Nutrition Facts" on the label, you need to measure the servings in order to count the amount of carbohydrates:  Measure the foods that you will eat with a food scale or measuring cup, if  needed.  Decide how many standard-size servings you will eat.  Multiply the number of servings by 15. Most carbohydrate-rich foods have about 15 g of carbohydrates per serving. ? For example, if you eat 8 oz (170 g) of strawberries, you will have eaten 2 servings and 30 g of carbohydrates (2 servings x 15 g = 30 g).  For foods that have more than one food mixed, such as soups and casseroles, you must count the carbohydrates in each food that is included. The following list contains standard serving sizes of common carbohydrate-rich foods. Each of these servings has about 15 g of carbohydrates:   hamburger bun or  English muffin.   oz (15 mL) syrup.   oz (14 g) jelly.  1 slice of bread.  1 six-inch tortilla.  3 oz (85 g) cooked rice or pasta.  4 oz (113 g) cooked dried beans.  4 oz (113 g) starchy vegetable, such as peas, corn, or potatoes.  4 oz (113 g) hot cereal.  4 oz (113 g) mashed potatoes or  of a large baked potato.  4 oz (113 g) canned or frozen fruit.  4 oz (120 mL) fruit juice.  4-6 crackers.  6 chicken nuggets.  6 oz (170 g) unsweetened dry cereal.  6 oz (170 g) plain fat-free yogurt or yogurt sweetened with artificial sweeteners.  8 oz (240 mL) milk.  8 oz (170 g) fresh fruit or one small piece of fruit.  24 oz (680 g) popped popcorn. Example of carbohydrate counting Sample meal  3 oz (85 g) chicken breast.  6 oz (170 g)   brown rice.  4 oz (113 g) corn.  8 oz (240 mL) milk.  8 oz (170 g) strawberries with sugar-free whipped topping. Carbohydrate calculation 1. Identify the foods that contain carbohydrates: ? Rice. ? Corn. ? Milk. ? Strawberries. 2. Calculate how many servings you have of each food: ? 2 servings rice. ? 1 serving corn. ? 1 serving milk. ? 1 serving strawberries. 3. Multiply each number of servings by 15 g: ? 2 servings rice x 15 g = 30 g. ? 1 serving corn x 15 g = 15 g. ? 1 serving milk x 15 g = 15 g. ? 1  serving strawberries x 15 g = 15 g. 4. Add together all of the amounts to find the total grams of carbohydrates eaten: ? 30 g + 15 g + 15 g + 15 g = 75 g of carbohydrates total. Summary  Carbohydrate counting is a method of keeping track of how many carbohydrates you eat.  Eating carbohydrates naturally increases the amount of sugar (glucose) in the blood.  Counting how many carbohydrates you eat helps keep your blood glucose within normal limits, which helps you manage your diabetes.  A diet and nutrition specialist (registered dietitian) can help you make a meal plan and calculate how many carbohydrates you should have at each meal and snack. This information is not intended to replace advice given to you by your health care provider. Make sure you discuss any questions you have with your health care provider. Document Revised: 03/30/2017 Document Reviewed: 02/17/2016 Elsevier Patient Education  2020 [employer]  

## 2020-07-29 DIAGNOSIS — N952 Postmenopausal atrophic vaginitis: Secondary | ICD-10-CM | POA: Insufficient documentation

## 2020-07-29 LAB — LIPID PANEL
Cholesterol: 163 mg/dL (ref 0–200)
HDL: 38.1 mg/dL — ABNORMAL LOW (ref >39.00)
LDL Cholesterol: 93 mg/dL (ref 0–99)
NonHDL: 124.88
Total CHOL/HDL Ratio: 4
Triglycerides: 161 mg/dL — ABNORMAL HIGH (ref 0.0–149.0)
VLDL: 32.2 mg/dL (ref 0.0–40.0)

## 2020-07-29 LAB — COMPREHENSIVE METABOLIC PANEL
ALT: 15 U/L (ref 0–35)
AST: 14 U/L (ref 0–37)
Albumin: 4 g/dL (ref 3.5–5.2)
Alkaline Phosphatase: 99 U/L (ref 39–117)
BUN: 14 mg/dL (ref 6–23)
CO2: 32 mEq/L (ref 19–32)
Calcium: 9.2 mg/dL (ref 8.4–10.5)
Chloride: 100 mEq/L (ref 96–112)
Creatinine, Ser: 0.78 mg/dL (ref 0.40–1.20)
GFR: 84.12 mL/min (ref >60.00)
Glucose, Bld: 114 mg/dL — ABNORMAL HIGH (ref 70–99)
Potassium: 4.1 mEq/L (ref 3.5–5.1)
Sodium: 140 mEq/L (ref 135–145)
Total Bilirubin: 0.3 mg/dL (ref 0.2–1.2)
Total Protein: 6.8 g/dL (ref 6.0–8.3)

## 2020-07-29 LAB — CBC
HCT: 41.2 % (ref 36.0–46.0)
Hemoglobin: 13.5 g/dL (ref 12.0–15.0)
MCHC: 32.6 g/dL (ref 30.0–36.0)
MCV: 89.7 fl (ref 78.0–100.0)
Platelets: 442 10*3/uL — ABNORMAL HIGH (ref 150.0–400.0)
RBC: 4.6 Mil/uL (ref 3.87–5.11)
RDW: 13.9 % (ref 11.5–15.5)
WBC: 10.9 10*3/uL — ABNORMAL HIGH (ref 4.0–10.5)

## 2020-07-29 LAB — TSH: TSH: 1.59 u[IU]/mL (ref 0.35–4.50)

## 2020-07-29 LAB — HEMOGLOBIN A1C: Hgb A1c MFr Bld: 10.6 % — ABNORMAL HIGH (ref 4.6–6.5)

## 2020-07-29 NOTE — Assessment & Plan Note (Signed)
[id number] unacceptable, minimize simple carbs. Increase exercise as tolerated. Continue current meds. Increase dose of Basaglar to 50 units.

## 2020-07-29 NOTE — Assessment & Plan Note (Signed)
Will try a course of Clobetasol 2-3 weekly and she will report if it does not improve.

## 2020-07-29 NOTE — Assessment & Plan Note (Signed)
Well controlled, no changes to meds. Encouraged heart healthy diet such as the DASH diet and exercise as tolerated.  °

## 2020-07-29 NOTE — Assessment & Plan Note (Signed)
Tolerating statin, encouraged heart healthy diet, avoid trans fats, minimize simple carbs and saturated fats. Increase exercise as tolerated 

## 2020-07-29 NOTE — Progress Notes (Signed)
Patient ID: Eileen Richards, female   DOB: Jan 24, 1961, 59 y.o.   MRN: [id number]   Subjective:    Patient ID: Eileen Richards, female    DOB: [birth year], 59 y.o.   MRN: [id number]  No chief complaint on file.   HPI Patient is in today for follow up on chronic medical concerns. No recent febrile illness or hospitalizations. She continues to struggle with vaginitis and has trouble with significant pruritus without any discharge. Did not respond to candidal treatments. Her sugars continue to run high when she checks them. Denies CP/palp/SOB/HA/congestion/fevers/GI or GU c/o. Taking meds as prescribed  Past Medical History:  Diagnosis Date  . Achilles tendonitis 10/14/2012  . Asthma    childhood  . Atypical chest pain    normal stress echo 02/2009 -LV size was normal LV global systolic function was normal- Normal wall motion; no LV regional wall motion abnormalities  . Benign paroxysmal positional vertigo 04/26/2015  . Chest pain, atypical 08/24/2013  . Diabetes mellitus   . GERD (gastroesophageal reflux disease)   . Hand pain, left 11/10/2015  . Hyperlipemia   . Hypertension   . Neuropathic pain of ankle, left 04/27/2017  . Onychomycosis 04/17/2014  . Pain of right heel 10/14/2012  . Preventative health care 11/22/2015  . Sinusitis, acute 01/17/2016  . Thoracic outlet syndrome 11/03/2014  . Urinary urgency 12/15/2015    Past Surgical History:  Procedure Laterality Date  . ABDOMINAL HYSTERECTOMY    . CYSTOSCOPY  07/23/2012   Procedure: CYSTOSCOPY;  Surgeon: Marvene Staff, MD;  Location: [facility];  Service: Gynecology;  Laterality: N/A;  . DILATION AND CURETTAGE OF UTERUS     Lysis of Adhesions  . ROBOTIC ASSISTED LAPAROSCOPIC LYSIS OF ADHESION  07/23/2012   Procedure: ROBOTIC ASSISTED LAPAROSCOPIC LYSIS OF ADHESION;  Surgeon: Marvene Staff, MD;  Location: [facility];  Service: Gynecology;  Laterality: N/A;  . ROBOTIC ASSISTED SALPINGO OOPHERECTOMY  07/23/2012   Procedure: ROBOTIC  ASSISTED SALPINGO OOPHERECTOMY;  Surgeon: Marvene Staff, MD;  Location: [facility];  Service: Gynecology;  Laterality: Bilateral;    Family History  Problem Relation Age of Onset  . GER disease Mother   . GER disease Father   . Heart disease Father        father has "heart problems"  . Anemia Father   . Asthma Other   . Hypertension Other   . Diabetes Other     Social History   Socioeconomic History  . Marital status: Single    Spouse name: Not on file  . Number of children: Not on file  . Years of education: Not on file  . Highest education level: Not on file  Occupational History  . Not on file  Tobacco Use  . Smoking status: Former Research scientist (life sciences)  . Smokeless tobacco: Never Used  Vaping Use  . Vaping Use: Never used  Substance and Sexual Activity  . Alcohol use: No  . Drug use: No  . Sexual activity: Not on file  Other Topics Concern  . Not on file  Social History Narrative  . Not on file   Social Determinants of Health   Financial Resource Strain:   . Difficulty of Paying Living Expenses: Not on file  Food Insecurity:   . Worried About Charity fundraiser in the Last Year: Not on file  . Ran Out of Food in the Last Year: Not on file  Transportation Needs:   . Lack of Transportation (Medical): Not on file  .  Lack of Transportation (Non-Medical): Not on file  Physical Activity:   . Days of Exercise per Week: Not on file  . Minutes of Exercise per Session: Not on file  Stress:   . Feeling of Stress : Not on file  Social Connections:   . Frequency of Communication with Friends and Family: Not on file  . Frequency of Social Gatherings with Friends and Family: Not on file  . Attends Religious Services: Not on file  . Active Member of Clubs or Organizations: Not on file  . Attends Archivist Meetings: Not on file  . Marital Status: Not on file  Intimate Partner Violence:   . Fear of Current or Ex-Partner: Not on file  . Emotionally Abused: Not on file    . Physically Abused: Not on file  . Sexually Abused: Not on file    Outpatient Medications Prior to Visit  Medication Sig Dispense Refill  . Accu-Chek FastClix Lancets MISC USE AS DIRECTED TWICE DAILY TO CHECK BLOOD SUGAR. DX E11.9 102 each 6  . albuterol (PROVENTIL HFA;VENTOLIN HFA) 108 (90 Base) MCG/ACT inhaler Inhale 2 puffs into the lungs every 6 (six) hours as needed for wheezing or shortness of breath. 1 Inhaler 6  . aspirin 81 MG tablet Take 81 mg by mouth daily.    Marland Kitchen atorvastatin (LIPITOR) 20 MG tablet TAKE 1 TABLET (20 MG TOTAL) BY MOUTH DAILY. 90 tablet 3  . Blood Glucose Monitoring Suppl (FREESTYLE FREEDOM LITE) w/Device KIT Use to check sugar twice a day.  Dx Code: E11.9 1 kit 0  . carvedilol (COREG) 3.125 MG tablet TAKE 1 TABLET BY MOUTH TWICE A DAY WITH A MEAL. 180 tablet 1  . cetirizine (ZYRTEC) 10 MG tablet TAKE 1 TABLET BY MOUTH 2 TIMES DAILY. 200 tablet 1  . diclofenac Sodium (VOLTAREN) 1 % GEL Apply 2 g topically 4 (four) times daily. Rub into affected area of foot 2 to 4 times daily 100 g 2  . famotidine (SM ACID REDUCER MAX ST) 20 MG tablet Take 1 tablet (20 mg total) by mouth 2 (two) times daily. 180 tablet 1  . gabapentin (NEURONTIN) 300 MG capsule Take 1 capsule (300 mg total) by mouth 3 (three) times daily. 90 capsule 1  . glimepiride (AMARYL) 4 MG tablet TAKE 1 TABLET (4 MG TOTAL) BY MOUTH TWICE A DAY 180 tablet 1  . glucose blood (FREESTYLE LITE) test strip Use to check sugar twice a day.  Dx Code: E11.9 100 each 1  . hydrochlorothiazide (HYDRODIURIL) 25 MG tablet TAKE 1 TABLET (25 MG TOTAL) BY MOUTH DAILY. 90 tablet 1  . Insulin Glargine (BASAGLAR KWIKPEN) 100 UNIT/ML Inject 48 Units into the skin daily. 15 mL 3  . Lancets (FREESTYLE) lancets Use to check sugar twice a day.  Dx Code: E11.9 100 each 1  . liraglutide (VICTOZA) 18 MG/3ML SOPN INJECT 0.3 MLS (1.8 MG TOTAL) INTO THE SKIN DAILY. 9 mL 3  . meclizine (ANTIVERT) 25 MG tablet Take 1 tablet (25 mg total) by  mouth 3 (three) times daily as needed for dizziness. 30 tablet 0  . metFORMIN (GLUCOPHAGE-XR) 500 MG 24 hr tablet TAKE 2 TABLETS BY MOUTH TWICE A DAY AND 1 TABLET AT NOON 450 tablet 0  . nitroGLYCERIN (NITROSTAT) 0.4 MG SL tablet Place 1 tablet (0.4 mg total) under the tongue every 5 (five) minutes x 3 doses as needed for chest pain. 40 tablet 1  . pantoprazole (PROTONIX) 40 MG tablet TAKE 1 TABLET (40  MG TOTAL) BY MOUTH DAILY. 90 tablet 1  . potassium chloride SA (KLOR-CON) 20 MEQ tablet Take 1 tablet (20 mEq total) by mouth daily. 90 tablet 0  . promethazine (PHENERGAN) 25 MG tablet Take 1 tablet (25 mg total) by mouth every 8 (eight) hours as needed for nausea or vomiting. 20 tablet 0  . tiZANidine (ZANAFLEX) 4 MG tablet Take 1 tablet (4 mg total) by mouth every 6 (six) hours as needed for muscle spasms. 40 tablet 1  . traMADol (ULTRAM) 50 MG tablet Take 1 tablet (50 mg total) by mouth every 12 (twelve) hours as needed. 60 tablet 0  . UNIFINE PENTIPS 31G X 5 MM MISC USE AS DIRECTED 2 TIMES A DAY WITH VICTOZA & LEVEMIR 100 each 0   No facility-administered medications prior to visit.    Allergies  Allergen Reactions  . Wilder Glade [Dapagliflozin] Hives    ROS     Objective:    Physical Exam Vitals and nursing note reviewed.  Constitutional:      General: She is not in acute distress.    Appearance: She is well-developed.  HENT:     Head: Normocephalic and atraumatic.     Nose: Nose normal.  Eyes:     General:        Right eye: No discharge.        Left eye: No discharge.  Cardiovascular:     Rate and Rhythm: Normal rate and regular rhythm.     Heart sounds: No murmur heard.   Pulmonary:     Effort: Pulmonary effort is normal.     Breath sounds: Normal breath sounds.  Abdominal:     General: Bowel sounds are normal.     Palpations: Abdomen is soft.     Tenderness: There is no abdominal tenderness.  Musculoskeletal:     Cervical back: Normal range of motion and neck supple.   Skin:    General: Skin is warm and dry.  Neurological:     Mental Status: She is alert and oriented to person, place, and time.     BP 126/74 (BP Location: Right Arm)   Pulse 89   Temp 98.4 F (36.9 C) (Oral)   Resp 18   Wt 248 lb 3.2 oz (112.6 kg)   SpO2 98%   BMI 42.60 kg/m  Wt Readings from Last 3 Encounters:  07/28/20 248 lb 3.2 oz (112.6 kg)  04/21/20 246 lb 6 oz (111.8 kg)  07/09/19 247 lb (112 kg)    Diabetic Foot Exam - Simple   No data filed     Lab Results  Component Value Date   WBC 10.9 (H) 07/28/2020   HGB 13.5 07/28/2020   HCT 41.2 07/28/2020   PLT 442.0 (H) 07/28/2020   GLUCOSE 114 (H) 07/28/2020   CHOL 163 07/28/2020   TRIG 161.0 (H) 07/28/2020   HDL 38.10 (L) 07/28/2020   LDLDIRECT 100.0 08/03/2017   LDLCALC 93 07/28/2020   ALT 15 07/28/2020   AST 14 07/28/2020   NA 140 07/28/2020   K 4.1 07/28/2020   CL 100 07/28/2020   CREATININE 0.78 07/28/2020   BUN 14 07/28/2020   CO2 32 07/28/2020   TSH 1.59 07/28/2020   HGBA1C 10.6 (H) 07/28/2020   MICROALBUR 0.9 11/12/2015    Lab Results  Component Value Date   TSH 1.59 07/28/2020   Lab Results  Component Value Date   WBC 10.9 (H) 07/28/2020   HGB 13.5 07/28/2020   HCT 41.2 07/28/2020  MCV 89.7 07/28/2020   PLT 442.0 (H) 07/28/2020   Lab Results  Component Value Date   NA 140 07/28/2020   K 4.1 07/28/2020   CO2 32 07/28/2020   GLUCOSE 114 (H) 07/28/2020   BUN 14 07/28/2020   CREATININE 0.78 07/28/2020   BILITOT 0.3 07/28/2020   ALKPHOS 99 07/28/2020   AST 14 07/28/2020   ALT 15 07/28/2020   PROT 6.8 07/28/2020   ALBUMIN 4.0 07/28/2020   CALCIUM 9.2 07/28/2020   GFR 84.12 07/28/2020   Lab Results  Component Value Date   CHOL 163 07/28/2020   Lab Results  Component Value Date   HDL 38.10 (L) 07/28/2020   Lab Results  Component Value Date   LDLCALC 93 07/28/2020   Lab Results  Component Value Date   TRIG 161.0 (H) 07/28/2020   Lab Results  Component Value Date    CHOLHDL 4 07/28/2020   Lab Results  Component Value Date   HGBA1C 10.6 (H) 07/28/2020       Assessment & Plan:   Problem List Items Addressed This Visit    Diabetes mellitus type 2 in obese ([place]) - Primary    hgba1c unacceptable, minimize simple carbs. Increase exercise as tolerated. Continue current meds. Increase dose of Basaglar to 50 units.       Relevant Orders   Hemoglobin A1c (Completed)   Hyperlipidemia, mixed    Tolerating statin, encouraged heart healthy diet, avoid trans fats, minimize simple carbs and saturated fats. Increase exercise as tolerated      Relevant Medications   blood glucose meter kit and supplies KIT   Other Relevant Orders   Lipid panel (Completed)   Essential hypertension    Well controlled, no changes to meds. Encouraged heart healthy diet such as the DASH diet and exercise as tolerated.       Relevant Orders   CBC (Completed)   Comprehensive metabolic panel (Completed)   TSH (Completed)   Atrophic vaginitis    Will try a course of Clobetasol 2-3 weekly and she will report if it does not improve.          I have discontinued Zorina Kathan's clobetasol ointment and Clobetasol Prop-Levocetirizine. I am also having her start on blood glucose meter kit and supplies and clobetasol ointment. Additionally, I am having her maintain her aspirin, promethazine, albuterol, nitroGLYCERIN, tiZANidine, Accu-Chek FastClix Lancets, famotidine, atorvastatin, FreeStyle Freedom Lite, FREESTYLE LITE, freestyle, meclizine, traMADol, pantoprazole, glimepiride, hydrochlorothiazide, carvedilol, cetirizine, Victoza, gabapentin, diclofenac Sodium, Basaglar KwikPen, metFORMIN, potassium chloride SA, and Unifine Pentips.  Meds ordered this encounter  Medications  . DISCONTD: clobetasol ointment (TEMOVATE) 0.05 %    Sig: Apply 1 application topically 2 (two) times daily.    Dispense:  60 g    Refill:  2  . DISCONTD: Clobetasol Prop-Levocetirizine 0.05-2 % SHAM     Sig: Apply 1 Dose topically at bedtime as needed.    Dispense:  60 g    Refill:  2  . blood glucose meter kit and supplies KIT    Sig: Dispense based on patient and insurance preference. Use up to four times daily as directed. (FOR ICD-9 250.00, 250.01).    Dispense:  1 each    Refill:  0    Order Specific Question:   Number of strips    Answer:   100    Order Specific Question:   Number of lancets    Answer:   100  . clobetasol ointment (TEMOVATE) 0.05 %  Sig: Apply 1 application topically daily as needed.    Dispense:  60 g    Refill:  1     Penni Homans, MD

## 2020-08-06 ENCOUNTER — Telehealth: Payer: 59 | Admitting: Family Medicine

## 2020-08-07 MED FILL — BASAGLAR 100 UNIT/ML KWIKPE: 100 | 25 days supply | Qty: 12 | Fill #2

## 2020-08-10 ENCOUNTER — Other Ambulatory Visit: Payer: Self-pay | Admitting: Family Medicine

## 2020-08-10 MED FILL — PANTOPRAZOLE SOD DR 40 MG T: 40 | 90 days supply | Qty: 90 | Fill #1

## 2020-08-10 MED FILL — JANUVIA 100 MG TABLET: 100 | 30 days supply | Qty: 30 | Fill #3

## 2020-08-10 MED FILL — VICTOZA 18 MG/3 ML INJECT P: 18 | 30 days supply | Qty: 9 | Fill #0

## 2020-09-02 ENCOUNTER — Other Ambulatory Visit: Payer: Self-pay | Admitting: Family Medicine

## 2020-09-02 MED FILL — BASAGLAR 100 UNIT/ML KWIKPE: 100 | 25 days supply | Qty: 12 | Fill #3

## 2020-09-02 MED FILL — UNIFINE PENTIPS 31GX3/16: 31G X 5 MM | 50 days supply | Qty: 100 | Fill #0

## 2020-09-17 ENCOUNTER — Other Ambulatory Visit: Payer: Self-pay | Admitting: Family Medicine

## 2020-09-17 ENCOUNTER — Telehealth: Payer: Self-pay | Admitting: Family Medicine

## 2020-09-17 ENCOUNTER — Other Ambulatory Visit: Payer: Self-pay | Admitting: *Deleted

## 2020-09-17 MED ORDER — BASAGLAR KWIKPEN 100 UNIT/ML ~~LOC~~ SOPN
48.0000 [IU] | PEN_INJECTOR | Freq: Every day | SUBCUTANEOUS | 3 refills | Status: DC
Start: 2020-09-17 — End: 2020-12-08

## 2020-09-17 MED FILL — BASAGLAR 100 UNIT/ML KWIKPE: 100 | 31 days supply | Qty: 15 | Fill #0

## 2020-09-17 NOTE — Telephone Encounter (Signed)
New rx sent in

## 2020-09-17 NOTE — Telephone Encounter (Signed)
Caller : [facility]  Call Back # [phone]  Pharmacy called to give clarification of prescription refill request for  Insulin Glargine (BASAGLAR KWIKPEN) 100.  Patient presented to pharmacy with a defected insulin pen and requested a new one, per pharmacy and vouche patient has , the pharmacy will need a prescription for a full prescription of 15 BASAGLAR insulin pens.   Please Advise

## 2020-09-21 ENCOUNTER — Other Ambulatory Visit: Payer: Self-pay | Admitting: Family Medicine

## 2020-09-21 MED FILL — ALBUTEROL SULFATE HFA 108 (: 108 (90 BAS | 25 days supply | Qty: 18 | Fill #0

## 2020-09-22 MED FILL — VICTOZA 18 MG/3 ML INJECT P: 18 | 30 days supply | Qty: 9 | Fill #1

## 2020-10-05 ENCOUNTER — Other Ambulatory Visit: Payer: Self-pay | Admitting: Family Medicine

## 2020-10-05 MED FILL — FAMOTIDINE 20 MG TABS: 20 | 90 days supply | Qty: 180 | Fill #0

## 2020-10-05 MED FILL — JANUVIA 100 MG TABLET: 100 | 30 days supply | Qty: 30 | Fill #4

## 2020-10-13 ENCOUNTER — Other Ambulatory Visit (HOSPITAL_COMMUNITY): Payer: Self-pay | Admitting: Obstetrics and Gynecology

## 2020-10-13 DIAGNOSIS — Z01419 Encounter for gynecological examination (general) (routine) without abnormal findings: Secondary | ICD-10-CM | POA: Diagnosis not present

## 2020-10-13 DIAGNOSIS — Z9079 Acquired absence of other genital organ(s): Secondary | ICD-10-CM | POA: Diagnosis not present

## 2020-10-13 DIAGNOSIS — Z90710 Acquired absence of both cervix and uterus: Secondary | ICD-10-CM | POA: Diagnosis not present

## 2020-10-13 DIAGNOSIS — L292 Pruritus vulvae: Secondary | ICD-10-CM | POA: Diagnosis not present

## 2020-10-13 MED FILL — CLOBETASOL PROPIONATE 0.05: 0.05 | 42 days supply | Qty: 90 | Fill #0

## 2020-10-21 MED FILL — VICTOZA 18 MG/3 ML INJECT P: 18 | 30 days supply | Qty: 9 | Fill #2

## 2020-11-03 ENCOUNTER — Other Ambulatory Visit: Payer: Self-pay | Admitting: Family Medicine

## 2020-11-03 ENCOUNTER — Telehealth: Payer: Self-pay | Admitting: Family Medicine

## 2020-11-03 DIAGNOSIS — E1169 Type 2 diabetes mellitus with other specified complication: Secondary | ICD-10-CM

## 2020-11-03 DIAGNOSIS — E669 Obesity, unspecified: Secondary | ICD-10-CM

## 2020-11-03 NOTE — Telephone Encounter (Signed)
Spoke with patient and states that she got a letter saying that her insulin is no longer cover and needs different one sent in. Pt also wanted to know if she still needed the referral for the endocrinologist.

## 2020-11-03 NOTE — Telephone Encounter (Signed)
Yes I would love for her to see endocrinology. I have placed referral. Please check with patient or her pharmacist to see what insulin they will pay for in substitution so we can write a new script.

## 2020-11-03 NOTE — Telephone Encounter (Signed)
Patient states she would like a call back in reference a medication, patient asked for details, patient states hard to explain, requesting to speak with the nurse about   Insulin Glargine [facility]) 100 UNIT/ML [[id number]

## 2020-11-04 NOTE — Telephone Encounter (Signed)
Spoke with patient, I advised her that the medication should still be covered and she should just fill the medication as she usually would.  I started a prior auth and it did not need an authorization.  Also patient is scheduled with endocrinology.  She sees them on 12/17/20.

## 2020-11-05 ENCOUNTER — Other Ambulatory Visit: Payer: Self-pay | Admitting: Family Medicine

## 2020-11-05 MED FILL — BASAGLAR 100 UNIT/ML KWIKPE: 100 | 31 days supply | Qty: 15 | Fill #1

## 2020-11-05 MED FILL — UNIFINE PENTIPS 31GX3/16: 31G X 5 MM | 50 days supply | Qty: 100 | Fill #0

## 2020-11-13 ENCOUNTER — Other Ambulatory Visit: Payer: Self-pay | Admitting: Family Medicine

## 2020-11-13 MED FILL — POTASSIUM CHLORIDE CRYS ER: 20 | 90 days supply | Qty: 90 | Fill #0

## 2020-11-13 MED FILL — HYDROCHLOROTHIAZIDE 25 MG T: 25 | 90 days supply | Qty: 90 | Fill #0

## 2020-11-13 MED FILL — PANTOPRAZOLE SOD DR 40 MG T: 40 | 90 days supply | Qty: 90 | Fill #0

## 2020-11-13 MED FILL — GLIMEPIRIDE 4 MG TABLET: 4 | 90 days supply | Qty: 180 | Fill #0

## 2020-11-13 MED FILL — JANUVIA 100 MG TABLET: 100 | 30 days supply | Qty: 30 | Fill #5

## 2020-11-13 MED FILL — CARVEDILOL 3.125 MG TABLET: 3.125 | 90 days supply | Qty: 180 | Fill #0

## 2020-11-29 ENCOUNTER — Encounter: Payer: Self-pay | Admitting: Family Medicine

## 2020-11-30 ENCOUNTER — Other Ambulatory Visit: Payer: Self-pay | Admitting: Family Medicine

## 2020-11-30 DIAGNOSIS — M79641 Pain in right hand: Secondary | ICD-10-CM

## 2020-11-30 MED ORDER — MECLIZINE HCL 25 MG PO TABS: 25.0000 mg | ORAL_TABLET | Freq: Three times a day (TID) | ORAL | 0 refills | Status: DC | PRN

## 2020-12-01 ENCOUNTER — Telehealth: Payer: Self-pay | Admitting: Family Medicine

## 2020-12-01 NOTE — Telephone Encounter (Signed)
Patient states she would like a call back in reference to xray of her hand

## 2020-12-01 NOTE — Telephone Encounter (Signed)
Spoke with pt and she is aware of x-ray being place and states that she will go tomorrow.

## 2020-12-01 NOTE — Telephone Encounter (Signed)
Ok

## 2020-12-02 ENCOUNTER — Ambulatory Visit (HOSPITAL_BASED_OUTPATIENT_CLINIC_OR_DEPARTMENT_OTHER)
Admission: RE | Admit: 2020-12-02 | Discharge: 2020-12-02 | Disposition: A | Discharge status: Home / Self Care | Payer: 59 | Source: Ambulatory Visit | Attending: Family Medicine | Admitting: Family Medicine

## 2020-12-02 ENCOUNTER — Other Ambulatory Visit: Payer: Self-pay

## 2020-12-02 DIAGNOSIS — M79641 Pain in right hand: Secondary | ICD-10-CM | POA: Diagnosis not present

## 2020-12-08 ENCOUNTER — Telehealth: Payer: Self-pay | Admitting: *Deleted

## 2020-12-08 ENCOUNTER — Other Ambulatory Visit: Payer: Self-pay | Admitting: Family Medicine

## 2020-12-08 MED ORDER — TRESIBA FLEXTOUCH 100 UNIT/ML ~~LOC~~ SOPN: 48.0000 [IU] | PEN_INJECTOR | Freq: Every day | SUBCUTANEOUS | 1 refills | Status: DC

## 2020-12-08 NOTE — Telephone Encounter (Signed)
OK d/c Basaglar and Add Tresiba U100 flex Touch pen, 48 units sq daily. Disp 30 day supply with 5 rf or 90 day with 1 at patient discretion

## 2020-12-08 NOTE — Telephone Encounter (Signed)
Patient insurance [place] faxed over stating that effective 12/18/20 her insurance will no longer cover [place].  The alternatives that they will cover is Semglee (YFGN) pen, Levemir Flextouch, or [place].    They would like for [place] to send over new rx to [facility].

## 2020-12-08 NOTE — Telephone Encounter (Signed)
Patient notified and rx sent in 

## 2020-12-10 ENCOUNTER — Telehealth: Payer: Self-pay | Admitting: *Deleted

## 2020-12-10 ENCOUNTER — Other Ambulatory Visit: Payer: Self-pay | Admitting: Family Medicine

## 2020-12-10 MED FILL — METFORMIN HCL ER 500 MG TB2: 500 | 90 days supply | Qty: 450 | Fill #0

## 2020-12-10 MED FILL — BASAGLAR 100 UNIT/ML KWIKPE: 100 | 31 days supply | Qty: 15 | Fill #2

## 2020-12-10 NOTE — Telephone Encounter (Signed)
It is fine to switch to [facility] it is a glargine insulin so same dosing for that as the [place]. Disp 30 day supply with 3 rf

## 2020-12-10 NOTE — Telephone Encounter (Signed)
[facility] outpatient sent a fax stating that Eileen Richards will be free with coupon.  Need to change from [place] this time.

## 2020-12-14 ENCOUNTER — Other Ambulatory Visit: Payer: Self-pay | Admitting: Family Medicine

## 2020-12-14 MED ORDER — SEMGLEE 100 UNIT/ML ~~LOC~~ SOPN: 48.0000 [IU] | PEN_INJECTOR | Freq: Every day | SUBCUTANEOUS | 1 refills | Status: DC

## 2020-12-14 NOTE — Telephone Encounter (Signed)
rx sent in for semglee

## 2020-12-15 ENCOUNTER — Other Ambulatory Visit: Payer: Self-pay | Admitting: Family Medicine

## 2020-12-17 ENCOUNTER — Ambulatory Visit: Payer: 59 | Admitting: Endocrinology

## 2020-12-17 ENCOUNTER — Other Ambulatory Visit: Payer: Self-pay

## 2020-12-17 ENCOUNTER — Other Ambulatory Visit: Payer: Self-pay | Admitting: Endocrinology

## 2020-12-17 VITALS — BP 162/90 | HR 104 | Ht 64.0 in | Wt 252.8 lb

## 2020-12-17 DIAGNOSIS — E669 Obesity, unspecified: Secondary | ICD-10-CM | POA: Diagnosis not present

## 2020-12-17 DIAGNOSIS — E1169 Type 2 diabetes mellitus with other specified complication: Secondary | ICD-10-CM | POA: Diagnosis not present

## 2020-12-17 LAB — POCT GLYCOSYLATED HEMOGLOBIN (HGB A1C): Hemoglobin A1C: 9.8 % — AB (ref 4.0–5.6)

## 2020-12-17 MED ORDER — SEMGLEE 100 UNIT/ML ~~LOC~~ SOPN: 55.0000 [IU] | PEN_INJECTOR | SUBCUTANEOUS | 3 refills | Status: DC

## 2020-12-17 MED ORDER — FREESTYLE LIBRE 2 SENSOR MISC: 1.0000 | 3 refills | Status: DC

## 2020-12-17 MED ORDER — FREESTYLE LIBRE 2 READER DEVI: 1.0000 | Freq: Once | 1 refills | Status: DC

## 2020-12-17 NOTE — Patient Instructions (Addendum)
good diet and exercise significantly improve the control of your diabetes.  please let me know if you wish to be referred to a dietician.  high blood sugar is very risky to your health.  you should see an eye doctor and dentist every year.  It is very important to get all recommended vaccinations.  Controlling your blood pressure and cholesterol drastically reduces the damage diabetes does to your body.  Those who smoke should quit.  Please discuss these with your doctor.  check your blood sugar twice a day.  vary the time of day when you check, between before the 3 meals, and at bedtime.  also check if you have symptoms of your blood sugar being too high or too low.  please keep a record of the readings and bring it to your next appointment here (or you can bring the meter itself).  You can write it on any piece of paper.  please call us sooner if your blood sugar goes below 70, or if most of your readings are over 200. We will need to take this complex situation in stages.    I have sent a prescription to your pharmacy, for the continuous glucose monitor.  If you help need operating this, please call or message [place].  Please bring the PDA to your next appointment.   check your blood sugar once a day.  vary the time of day when you check, between before the 3 meals, and at bedtime.  also check if you have symptoms of your blood sugar being too high or too low.  please keep a record of the readings and bring it to your next appointment here (or you can bring the meter itself).  You can write it on any piece of paper.  please call us sooner if your blood sugar goes below 70, or if most of your readings are over 200.  For now, please increase the Semglee to 55 units each morning, and: Please continue the same other medications.   Please come back for a follow-up appointment in 1 month.

## 2020-12-17 NOTE — Progress Notes (Signed)
Subjective:    Patient ID: Eileen Richards, female    DOB: [birth year], 60 y.o.   MRN: [id number]  HPI pt is referred by Dr Charlett Blake, for diabetes.  Pt states DM was dx'ed in 2009; She is unaware of any chronic complications; She has been on insulin since 2018; pt says her diet and exercise are not good; she works Water engineer, 7AM-9PM, M-F; She has never had GDM (G0), pancreatitis, pancreatic surgery, severe hypoglycemia or DKA.  She takes Semglee 48/d, Victoza, and 2 oral meds.  She has gained weight.  She has not recently checked cbg.   Past Medical History:  Diagnosis Date  . Achilles tendonitis 10/14/2012  . Asthma    childhood  . Atypical chest pain    normal stress echo 02/2009 -LV size was normal LV global systolic function was normal- Normal wall motion; no LV regional wall motion abnormalities  . Benign paroxysmal positional vertigo 04/26/2015  . Chest pain, atypical 08/24/2013  . Diabetes mellitus   . GERD (gastroesophageal reflux disease)   . Hand pain, left 11/10/2015  . Hyperlipemia   . Hypertension   . Neuropathic pain of ankle, left 04/27/2017  . Onychomycosis 04/17/2014  . Pain of right heel 10/14/2012  . Preventative health care 11/22/2015  . Sinusitis, acute 01/17/2016  . Thoracic outlet syndrome 11/03/2014  . Urinary urgency 12/15/2015    Past Surgical History:  Procedure Laterality Date  . ABDOMINAL HYSTERECTOMY    . CYSTOSCOPY  07/23/2012   Procedure: CYSTOSCOPY;  Surgeon: Marvene Staff, MD;  Location: [facility];  Service: Gynecology;  Laterality: N/A;  . DILATION AND CURETTAGE OF UTERUS     Lysis of Adhesions  . ROBOTIC ASSISTED LAPAROSCOPIC LYSIS OF ADHESION  07/23/2012   Procedure: ROBOTIC ASSISTED LAPAROSCOPIC LYSIS OF ADHESION;  Surgeon: Marvene Staff, MD;  Location: [facility];  Service: Gynecology;  Laterality: N/A;  . ROBOTIC ASSISTED SALPINGO OOPHERECTOMY  07/23/2012   Procedure: ROBOTIC ASSISTED SALPINGO OOPHERECTOMY;  Surgeon: Marvene Staff, MD;  Location: [facility];  Service: Gynecology;  Laterality: Bilateral;    Social History   Socioeconomic History  . Marital status: Single    Spouse name: Not on file  . Number of children: Not on file  . Years of education: Not on file  . Highest education level: Not on file  Occupational History  . Not on file  Tobacco Use  . Smoking status: Former Research scientist (life sciences)  . Smokeless tobacco: Never Used  Vaping Use  . Vaping Use: Never used  Substance and Sexual Activity  . Alcohol use: No  . Drug use: No  . Sexual activity: Not on file  Other Topics Concern  . Not on file  Social History Narrative  . Not on file   Social Determinants of Health   Financial Resource Strain: Not on file  Food Insecurity: Not on file  Transportation Needs: Not on file  Physical Activity: Not on file  Stress: Not on file  Social Connections: Not on file  Intimate Partner Violence: Not on file    Current Outpatient Medications on File Prior to Visit  Medication Sig Dispense Refill  . Accu-Chek FastClix Lancets MISC USE AS DIRECTED TWICE DAILY TO CHECK BLOOD SUGAR. DX E11.9 102 each 6  . aspirin 81 MG tablet Take 81 mg by mouth daily.    Marland Kitchen atorvastatin (LIPITOR) 20 MG tablet TAKE 1 TABLET (20 MG TOTAL) BY MOUTH DAILY. 90 tablet 3  . blood glucose meter kit and  supplies KIT Dispense based on patient and insurance preference. Use up to four times daily as directed. (FOR ICD-9 250.00, 250.01). 1 each 0  . Blood Glucose Monitoring Suppl (FREESTYLE FREEDOM LITE) w/Device KIT Use to check sugar twice a day.  Dx Code: E11.9 1 kit 0  . cetirizine (ZYRTEC) 10 MG tablet TAKE 1 TABLET BY MOUTH 2 TIMES DAILY. 200 tablet 1  . diclofenac Sodium (VOLTAREN) 1 % GEL Apply 2 g topically 4 (four) times daily. Rub into affected area of foot 2 to 4 times daily 100 g 2  . gabapentin (NEURONTIN) 300 MG capsule Take 1 capsule (300 mg total) by mouth 3 (three) times daily. 90 capsule 1  . glucose blood (FREESTYLE LITE)  test strip Use to check sugar twice a day.  Dx Code: E11.9 100 each 1  . Lancets (FREESTYLE) lancets Use to check sugar twice a day.  Dx Code: E11.9 100 each 1  . nitroGLYCERIN (NITROSTAT) 0.4 MG SL tablet Place 1 tablet (0.4 mg total) under the tongue every 5 (five) minutes x 3 doses as needed for chest pain. 40 tablet 1  . promethazine (PHENERGAN) 25 MG tablet Take 1 tablet (25 mg total) by mouth every 8 (eight) hours as needed for nausea or vomiting. 20 tablet 0  . tiZANidine (ZANAFLEX) 4 MG tablet Take 1 tablet (4 mg total) by mouth every 6 (six) hours as needed for muscle spasms. 40 tablet 1  . traMADol (ULTRAM) 50 MG tablet Take 1 tablet (50 mg total) by mouth every 12 (twelve) hours as needed. 60 tablet 0  . albuterol (VENTOLIN HFA) 108 (90 Base) MCG/ACT inhaler INHALE 2 PUFFS INTO THE LUNGS EVERY 6 (SIX) HOURS AS NEEDED FOR WHEEZING OR SHORTNESS OF BREATH. 18 g 5  . carvedilol (COREG) 3.125 MG tablet TAKE 1 TABLET BY MOUTH TWICE DAILY WITH MEAL. 180 tablet 1  . clobetasol ointment (TEMOVATE) 0.05 % APPLY A SMALL AMOUNT TO VULVAR AREA TWO TIMES DAILY FOR 2 WEEKS, THEN EVERY OTHER DAY FOR 2 WEEKS, THEN MONDAY, WEDNESDAY, FRIDAY FOR 2 WEEKS 90 g 0  . clobetasol ointment (TEMOVATE) 8.50 % APPLY 1 APPLICATION TOPICALLY 2 (TWO) TIMES DAILY. 60 g 2  . famotidine (PEPCID) 20 MG tablet TAKE 1 TABLET (20 MG TOTAL) BY MOUTH 2 (TWO) TIMES DAILY. 180 tablet 3  . glimepiride (AMARYL) 4 MG tablet TAKE 1 TABLET (4 MG TOTAL) BY MOUTH IN THE MORNING AND AT BEDTIME. 180 tablet 1  . glucose blood test strip USE 1 STRIP TO CHECK BLOOD GLUCOSE UP TO FOUR TIMES DAILY AS DIRECTED. 100 strip 0  . hydrochlorothiazide (HYDRODIURIL) 25 MG tablet TAKE 1 TABLET (25 MG TOTAL) BY MOUTH DAILY. 90 tablet 1  . Insulin Pen Needle 31G X 5 MM MISC USE W/ VICTOZA AND BASAGLAR 100 each 3  . Lancets (FREESTYLE) lancets USE 1 TO CHECK BLOOD GLUCOSE UP TO FOUR TIMES DAILY AS DIRECTED. 100 each 0  . liraglutide (VICTOZA) 18 MG/3ML SOPN  INJECT 0.3 MLS (1.8 MG TOTAL) INTO THE SKIN DAILY. 9 mL 3  . meclizine (ANTIVERT) 25 MG tablet TAKE 1 TABLET (25 MG TOTAL) BY MOUTH 3 (THREE) TIMES DAILY AS NEEDED FOR DIZZINESS. 30 tablet 0  . metFORMIN (GLUCOPHAGE-XR) 500 MG 24 hr tablet TAKE 2 TABLETS BY MOUTH TWICE A DAY AND 1 TABLET AT NOON 450 tablet 0  . pantoprazole (PROTONIX) 40 MG tablet TAKE 1 TABLET (40 MG TOTAL) BY MOUTH DAILY. 90 tablet 1  . potassium chloride SA (  KLOR-CON) 20 MEQ tablet TAKE 1 TABLET (20 MEQ TOTAL) BY MOUTH DAILY. 90 tablet 1  . [DISCONTINUED] JANUVIA 100 MG tablet TAKE 1 TABLET BY MOUTH DAILY. 30 tablet 5   No current facility-administered medications on file prior to visit.    Allergies  Allergen Reactions  . Farxiga [Dapagliflozin] Hives    Family History  Problem Relation Age of Onset  . GER disease Mother   . GER disease Father   . Heart disease Father        father has "heart problems"  . Anemia Father   . Asthma Other   . Hypertension Other   . Diabetes Other     BP (!) 162/90 (BP Location: Right Arm, Patient Position: Sitting, Cuff Size: Large)   Pulse (!) 104   Ht $R'5\' 4"'gB$  (1.626 m)   Wt 252 lb 12.8 oz (114.7 kg)   SpO2 97%   BMI 43.39 kg/m     Review of Systems denies weight loss, blurry vision, chest pain, sob, n/v, urinary frequency, memory loss, and depression.        Objective:   Physical Exam VITAL SIGNS:  See vs page GENERAL: no distress Pulses: dorsalis pedis intact bilat.   MSK: no deformity of the feet CV: 1+ bilat leg edema Skin:  no ulcer on the feet.  normal color and temp on the feet.   Neuro: sensation is intact to touch on the feet.   Ext: there is bilateral onychomycosis of the toenails  A1c=9.8%  Lab Results  Component Value Date   CREATININE 0.78 07/28/2020   BUN 14 07/28/2020   NA 140 07/28/2020   K 4.1 07/28/2020   CL 100 07/28/2020   CO2 32 07/28/2020   I have reviewed outside records, and summarized: Pt was noted to have elevated A1c, and  referred here.  HTN, dyslipidemia, and atrophic vaginitis were also addressed.       Assessment & Plan:  Insulin-requiring type 2 DM, new to me: uncontrolled  Patient Instructions  good diet and exercise significantly improve the control of your diabetes.  please let me know if you wish to be referred to a dietician.  high blood sugar is very risky to your health.  you should see an eye doctor and dentist every year.  It is very important to get all recommended vaccinations.  Controlling your blood pressure and cholesterol drastically reduces the damage diabetes does to your body.  Those who smoke should quit.  Please discuss these with your doctor.  check your blood sugar twice a day.  vary the time of day when you check, between before the 3 meals, and at bedtime.  also check if you have symptoms of your blood sugar being too high or too low.  please keep a record of the readings and bring it to your next appointment here (or you can bring the meter itself).  You can write it on any piece of paper.  please call us sooner if your blood sugar goes below 70, or if most of your readings are over 200. We will need to take this complex situation in stages.    I have sent a prescription to your pharmacy, for the continuous glucose monitor.  If you help need operating this, please call or message [place].  Please bring the PDA to your next appointment.   check your blood sugar once a day.  vary the time of day when you check, between before the 3 meals, and at  bedtime.  also check if you have symptoms of your blood sugar being too high or too low.  please keep a record of the readings and bring it to your next appointment here (or you can bring the meter itself).  You can write it on any piece of paper.  please call us sooner if your blood sugar goes below 70, or if most of your readings are over 200.  For now, please increase the Semglee to 55 units each morning, and: Please continue the same other medications.    Please come back for a follow-up appointment in 1 month.

## 2020-12-19 ENCOUNTER — Other Ambulatory Visit (HOSPITAL_COMMUNITY): Payer: Self-pay

## 2020-12-21 ENCOUNTER — Other Ambulatory Visit (HOSPITAL_COMMUNITY): Payer: Self-pay

## 2020-12-21 ENCOUNTER — Telehealth: Payer: Self-pay

## 2020-12-21 MED FILL — Continuous Glucose System Receiver: 30 days supply | Qty: 1 | Fill #0 | Status: CN

## 2020-12-21 MED FILL — Continuous Glucose System Sensor: 28 days supply | Qty: 2 | Fill #0 | Status: AC

## 2020-12-21 NOTE — Telephone Encounter (Signed)
PA has been approved for [employer] from 12/18/2020-12/17/2021 and the pt has been notified.

## 2020-12-22 ENCOUNTER — Other Ambulatory Visit (HOSPITAL_COMMUNITY): Payer: Self-pay

## 2020-12-22 MED FILL — Continuous Glucose System Receiver: 30 days supply | Qty: 1 | Fill #0 | Status: AC

## 2020-12-30 ENCOUNTER — Other Ambulatory Visit: Payer: Self-pay | Admitting: Family Medicine

## 2020-12-30 MED FILL — Insulin Pen Needle 31 G X 5 MM (1/5" or 3/16"): 50 days supply | Qty: 100 | Fill #0 | Status: AC

## 2020-12-31 ENCOUNTER — Other Ambulatory Visit (HOSPITAL_COMMUNITY): Payer: Self-pay

## 2020-12-31 MED ORDER — DEXCOM G6 SENSOR MISC
99 refills | Status: DC
  Filled 2020-12-31: qty 9, 90d supply, fill #0

## 2021-01-01 ENCOUNTER — Other Ambulatory Visit (HOSPITAL_COMMUNITY): Payer: Self-pay

## 2021-01-01 ENCOUNTER — Other Ambulatory Visit: Payer: Self-pay | Admitting: Family Medicine

## 2021-01-03 MED ORDER — VICTOZA 18 MG/3ML ~~LOC~~ SOPN
1.8000 mg | PEN_INJECTOR | Freq: Every day | SUBCUTANEOUS | 3 refills | Status: DC
  Filled 2021-01-03: qty 9, 30d supply, fill #0

## 2021-01-03 MED ORDER — SITAGLIPTIN PHOSPHATE 100 MG PO TABS
100.0000 mg | ORAL_TABLET | Freq: Every day | ORAL | 5 refills | Status: DC
  Filled 2021-01-03: qty 30, 30d supply, fill #0
  Filled 2021-02-07: qty 30, 30d supply, fill #1
  Filled 2021-03-04: qty 30, 30d supply, fill #2

## 2021-01-04 ENCOUNTER — Other Ambulatory Visit (HOSPITAL_COMMUNITY): Payer: Self-pay

## 2021-01-06 ENCOUNTER — Other Ambulatory Visit (HOSPITAL_COMMUNITY): Payer: Self-pay

## 2021-01-08 ENCOUNTER — Other Ambulatory Visit (HOSPITAL_COMMUNITY): Payer: Self-pay

## 2021-01-08 MED FILL — Insulin Glargine-yfgn Soln Pen-Injector 100 Unit/ML: SUBCUTANEOUS | 87 days supply | Qty: 48 | Fill #0 | Status: AC

## 2021-01-14 ENCOUNTER — Other Ambulatory Visit: Payer: Self-pay

## 2021-01-14 ENCOUNTER — Ambulatory Visit: Payer: 59 | Attending: Internal Medicine

## 2021-01-14 ENCOUNTER — Ambulatory Visit: Payer: 59 | Admitting: Family Medicine

## 2021-01-14 DIAGNOSIS — M62838 Other muscle spasm: Secondary | ICD-10-CM

## 2021-01-14 DIAGNOSIS — E782 Mixed hyperlipidemia: Secondary | ICD-10-CM

## 2021-01-14 DIAGNOSIS — I10 Essential (primary) hypertension: Secondary | ICD-10-CM

## 2021-01-14 DIAGNOSIS — E669 Obesity, unspecified: Secondary | ICD-10-CM

## 2021-01-14 DIAGNOSIS — E6609 Other obesity due to excess calories: Secondary | ICD-10-CM | POA: Diagnosis not present

## 2021-01-14 DIAGNOSIS — E1169 Type 2 diabetes mellitus with other specified complication: Secondary | ICD-10-CM

## 2021-01-14 DIAGNOSIS — Z23 Encounter for immunization: Secondary | ICD-10-CM

## 2021-01-14 DIAGNOSIS — R0789 Other chest pain: Secondary | ICD-10-CM

## 2021-01-14 NOTE — Assessment & Plan Note (Signed)
Encouraged heart healthy diet, increase exercise, avoid trans fats, consider a krill oil cap daily 

## 2021-01-14 NOTE — Progress Notes (Signed)
   Covid-19 Vaccination Clinic  Name:  Eileen Richards    MRN: [id number] DOB: 1961-08-30  01/14/2021  [name] was observed post Covid-19 immunization for 15 minutes without incident. She was provided with Vaccine Information Sheet and instruction to access the V-Safe system.   [name] was instructed to call 911 with any severe reactions post vaccine: Marland Kitchen Difficulty breathing  . Swelling of face and throat  . A fast heartbeat  . A bad rash all over body  . Dizziness and weakness   Immunizations Administered    Name Date Dose VIS Date Route   PFIZER Comrnaty(Gray TOP) Covid-19 Vaccine 01/14/2021  2:47 PM 0.3 mL 08/27/2020 Intramuscular   Manufacturer: [employer]   Lot: [id number]   NDC: [phone]

## 2021-01-14 NOTE — Patient Instructions (Addendum)
https://www.diabeteseducator.org/docs/default-source/living-with-diabetes/conquering-the-grocery-store-v1.pdf?sfvrsn=4">  Hydrate 60-80 ounces of water daily Hyland's leg cramp   Carbohydrate Counting for Diabetes Mellitus, Adult Carbohydrate counting is a method of keeping track of how many carbohydrates you eat. Eating carbohydrates naturally increases the amount of sugar (glucose) in the blood. Counting how many carbohydrates you eat improves your blood glucose control, which helps you manage your diabetes. It is important to know how many carbohydrates you can safely have in each meal. This is different for every person. A dietitian can help you make a meal plan and calculate how many carbohydrates you should have at each meal and snack. What foods contain carbohydrates? Carbohydrates are found in the following foods:  Grains, such as breads and cereals.  Dried beans and soy products.  Starchy vegetables, such as potatoes, peas, and corn.  Fruit and fruit juices.  Milk and yogurt.  Sweets and snack foods, such as cake, cookies, candy, chips, and soft drinks.   How do I count carbohydrates in foods? There are two ways to count carbohydrates in food. You can read food labels or learn standard serving sizes of foods. You can use either of the methods or a combination of both. Using the Nutrition Facts label The Nutrition Facts list is included on the labels of almost all packaged foods and beverages in the [place] It includes:  The serving size.  Information about nutrients in each serving, including the grams (g) of carbohydrate per serving. To use the Nutrition Facts:  Decide how many servings you will have.  Multiply the number of servings by the number of carbohydrates per serving.  The resulting number is the total amount of carbohydrates that you will be having. Learning the standard serving sizes of foods When you eat carbohydrate foods that are not packaged or do not  include Nutrition Facts on the label, you need to measure the servings in order to count the amount of carbohydrates.  Measure the foods that you will eat with a food scale or measuring cup, if needed.  Decide how many standard-size servings you will eat.  Multiply the number of servings by 15. For foods that contain carbohydrates, one serving equals 15 g of carbohydrates. ? For example, if you eat 2 cups or 10 oz (300 g) of strawberries, you will have eaten 2 servings and 30 g of carbohydrates (2 servings x 15 g = 30 g).  For foods that have more than one food mixed, such as soups and casseroles, you must count the carbohydrates in each food that is included. The following list contains standard serving sizes of common carbohydrate-rich foods. Each of these servings has about 15 g of carbohydrates:  1 slice of bread.  1 six-inch (15 cm) tortilla.  ? cup or 2 oz (53 g) cooked rice or pasta.   cup or 3 oz (85 g) cooked or canned, drained and rinsed beans or lentils.   cup or 3 oz (85 g) starchy vegetable, such as peas, corn, or squash.   cup or 4 oz (120 g) hot cereal.   cup or 3 oz (85 g) boiled or mashed potatoes, or  or 3 oz (85 g) of a large baked potato.   cup or 4 fl oz (118 mL) fruit juice.  1 cup or 8 fl oz (237 mL) milk.  1 small or 4 oz (106 g) apple.   or 2 oz (63 g) of a medium banana.  1 cup or 5 oz (150 g) strawberries.  3 cups or  1 oz (24 g) popped popcorn. What is an example of carbohydrate counting? To calculate the number of carbohydrates in this sample meal, follow the steps shown below. Sample meal  3 oz (85 g) chicken breast.  ? cup or 4 oz (106 g) brown rice.   cup or 3 oz (85 g) corn.  1 cup or 8 fl oz (237 mL) milk.  1 cup or 5 oz (150 g) strawberries with sugar-free whipped topping. Carbohydrate calculation 1. Identify the foods that contain carbohydrates: ? Rice. ? Corn. ? Milk. ? Strawberries. 2. Calculate how many servings you  have of each food: ? 2 servings rice. ? 1 serving corn. ? 1 serving milk. ? 1 serving strawberries. 3. Multiply each number of servings by 15 g: ? 2 servings rice x 15 g = 30 g. ? 1 serving corn x 15 g = 15 g. ? 1 serving milk x 15 g = 15 g. ? 1 serving strawberries x 15 g = 15 g. 4. Add together all of the amounts to find the total grams of carbohydrates eaten: ? 30 g + 15 g + 15 g + 15 g = 75 g of carbohydrates total. What are tips for following this plan? Shopping  Develop a meal plan and then make a shopping list.  Buy fresh and frozen vegetables, fresh and frozen fruit, dairy, eggs, beans, lentils, and whole grains.  Look at food labels. Choose foods that have more fiber and less sugar.  Avoid processed foods and foods with added sugars. Meal planning  Aim to have the same amount of carbohydrates at each meal and for each snack time.  Plan to have regular, balanced meals and snacks. Where to find more information  American Diabetes Association: www.diabetes.org  Centers for Disease Control and Prevention: http://www.wolf.info/ Summary  Carbohydrate counting is a method of keeping track of how many carbohydrates you eat.  Eating carbohydrates naturally increases the amount of sugar (glucose) in the blood.  Counting how many carbohydrates you eat improves your blood glucose control, which helps you manage your diabetes.  A dietitian can help you make a meal plan and calculate how many carbohydrates you should have at each meal and snack. This information is not intended to replace advice given to you by your health care provider. Make sure you discuss any questions you have with your health care provider. Document Revised: 09/05/2019 Document Reviewed: 09/06/2019 Elsevier Patient Education  2021 [employer].

## 2021-01-15 ENCOUNTER — Other Ambulatory Visit (HOSPITAL_BASED_OUTPATIENT_CLINIC_OR_DEPARTMENT_OTHER): Payer: Self-pay

## 2021-01-15 LAB — LIPID PANEL
Cholesterol: 239 mg/dL — ABNORMAL HIGH (ref 0–200)
HDL: 36.8 mg/dL — ABNORMAL LOW (ref >39.00)
LDL Cholesterol: 167 mg/dL — ABNORMAL HIGH (ref 0–99)
NonHDL: 202.24
Total CHOL/HDL Ratio: 6
Triglycerides: 174 mg/dL — ABNORMAL HIGH (ref 0.0–149.0)
VLDL: 34.8 mg/dL (ref 0.0–40.0)

## 2021-01-15 LAB — COMPREHENSIVE METABOLIC PANEL
ALT: 15 U/L (ref 0–35)
AST: 16 U/L (ref 0–37)
Albumin: 4 g/dL (ref 3.5–5.2)
Alkaline Phosphatase: 83 U/L (ref 39–117)
BUN: 14 mg/dL (ref 6–23)
CO2: 32 mEq/L (ref 19–32)
Calcium: 9.6 mg/dL (ref 8.4–10.5)
Chloride: 99 mEq/L (ref 96–112)
Creatinine, Ser: 0.7 mg/dL (ref 0.40–1.20)
GFR: 95.48 mL/min (ref >60.00)
Glucose, Bld: 59 mg/dL — ABNORMAL LOW (ref 70–99)
Potassium: 4 mEq/L (ref 3.5–5.1)
Sodium: 139 mEq/L (ref 135–145)
Total Bilirubin: 0.5 mg/dL (ref 0.2–1.2)
Total Protein: 6.9 g/dL (ref 6.0–8.3)

## 2021-01-15 LAB — CBC WITH DIFFERENTIAL/PLATELET
Basophils Absolute: 0.1 10*3/uL (ref 0.0–0.1)
Basophils Relative: 0.7 % (ref 0.0–3.0)
Eosinophils Absolute: 0.3 10*3/uL (ref 0.0–0.7)
Eosinophils Relative: 2.3 % (ref 0.0–5.0)
HCT: 40 % (ref 36.0–46.0)
Hemoglobin: 13 g/dL (ref 12.0–15.0)
Lymphocytes Relative: 24 % (ref 12.0–46.0)
Lymphs Abs: 2.8 10*3/uL (ref 0.7–4.0)
MCHC: 32.7 g/dL (ref 30.0–36.0)
MCV: 89.9 fl (ref 78.0–100.0)
Monocytes Absolute: 0.6 10*3/uL (ref 0.1–1.0)
Monocytes Relative: 5.1 % (ref 3.0–12.0)
Neutro Abs: 7.9 10*3/uL — ABNORMAL HIGH (ref 1.4–7.7)
Neutrophils Relative %: 67.9 % (ref 43.0–77.0)
Platelets: 464 10*3/uL — ABNORMAL HIGH (ref 150.0–400.0)
RBC: 4.44 Mil/uL (ref 3.87–5.11)
RDW: 13.9 % (ref 11.5–15.5)
WBC: 11.7 10*3/uL — ABNORMAL HIGH (ref 4.0–10.5)

## 2021-01-15 LAB — MICROALBUMIN / CREATININE URINE RATIO
Creatinine,U: 35.3 mg/dL
Microalb Creat Ratio: 2 mg/g (ref 0.0–30.0)
Microalb, Ur: <0.7 mg/dL (ref 0.0–1.9)

## 2021-01-15 LAB — TSH: TSH: 1.56 u[IU]/mL (ref 0.35–4.50)

## 2021-01-15 MED ORDER — PFIZER-BIONT COVID-19 VAC-TRIS 30 MCG/0.3ML IM SUSP
INTRAMUSCULAR | 0 refills | Status: DC
  Filled 2021-01-15: qty 0.3, 1d supply, fill #0

## 2021-01-16 DIAGNOSIS — M62838 Other muscle spasm: Secondary | ICD-10-CM | POA: Insufficient documentation

## 2021-01-16 NOTE — Progress Notes (Signed)
Subjective:    Patient ID: Eileen Richards, female    DOB: [birth year], 60 y.o.   MRN: [id number]  Chief Complaint  Patient presents with  . Follow-up  . Hypertension  . Diabetes    HPI Patient is in today for follow up on chronic medical concerns including hypertension and diabetes mellitus type 2. She feels well today but she acknowledges she has atypical chest pain in left upper chest wall at times. No pattern and is short lived. No associated symptoms. She has some spasm in her left upper leg at times mostly at night. She acknowledges she does not hydrate well and does not get 60 ounces of fluids most days. Well controlled, no changes to meds. Encouraged heart healthy diet such as the DASH diet and exercise as tolerated. Denies palp/SOB/HA/congestion/fevers/GI or GU c/o. Taking meds as prescribed  Past Medical History:  Diagnosis Date  . Achilles tendonitis 10/14/2012  . Asthma    childhood  . Atypical chest pain    normal stress echo 02/2009 -LV size was normal LV global systolic function was normal- Normal wall motion; no LV regional wall motion abnormalities  . Benign paroxysmal positional vertigo 04/26/2015  . Chest pain, atypical 08/24/2013  . Diabetes mellitus   . GERD (gastroesophageal reflux disease)   . Hand pain, left 11/10/2015  . Hyperlipemia   . Hypertension   . Neuropathic pain of ankle, left 04/27/2017  . Onychomycosis 04/17/2014  . Pain of right heel 10/14/2012  . Preventative health care 11/22/2015  . Sinusitis, acute 01/17/2016  . Thoracic outlet syndrome 11/03/2014  . Urinary urgency 12/15/2015    Past Surgical History:  Procedure Laterality Date  . ABDOMINAL HYSTERECTOMY    . CYSTOSCOPY  07/23/2012   Procedure: CYSTOSCOPY;  Surgeon: Marvene Staff, MD;  Location: [facility];  Service: Gynecology;  Laterality: N/A;  . DILATION AND CURETTAGE OF UTERUS     Lysis of Adhesions  . ROBOTIC ASSISTED LAPAROSCOPIC LYSIS OF ADHESION  07/23/2012   Procedure: ROBOTIC  ASSISTED LAPAROSCOPIC LYSIS OF ADHESION;  Surgeon: Marvene Staff, MD;  Location: [facility];  Service: Gynecology;  Laterality: N/A;  . ROBOTIC ASSISTED SALPINGO OOPHERECTOMY  07/23/2012   Procedure: ROBOTIC ASSISTED SALPINGO OOPHERECTOMY;  Surgeon: Marvene Staff, MD;  Location: [facility];  Service: Gynecology;  Laterality: Bilateral;    Family History  Problem Relation Age of Onset  . GER disease Mother   . GER disease Father   . Heart disease Father        father has "heart problems"  . Anemia Father   . Asthma Other   . Hypertension Other   . Diabetes Other     Social History   Socioeconomic History  . Marital status: Single    Spouse name: Not on file  . Number of children: Not on file  . Years of education: Not on file  . Highest education level: Not on file  Occupational History  . Not on file  Tobacco Use  . Smoking status: Former Research scientist (life sciences)  . Smokeless tobacco: Never Used  Vaping Use  . Vaping Use: Never used  Substance and Sexual Activity  . Alcohol use: No  . Drug use: No  . Sexual activity: Not on file  Other Topics Concern  . Not on file  Social History Narrative  . Not on file   Social Determinants of Health   Financial Resource Strain: Not on file  Food Insecurity: Not on file  Transportation Needs: Not on  file  Physical Activity: Not on file  Stress: Not on file  Social Connections: Not on file  Intimate Partner Violence: Not on file    Outpatient Medications Prior to Visit  Medication Sig Dispense Refill  . Accu-Chek FastClix Lancets MISC USE AS DIRECTED TWICE DAILY TO CHECK BLOOD SUGAR. DX E11.9 102 each 6  . albuterol (VENTOLIN HFA) 108 (90 Base) MCG/ACT inhaler INHALE 2 PUFFS INTO THE LUNGS EVERY 6 (SIX) HOURS AS NEEDED FOR WHEEZING OR SHORTNESS OF BREATH. 18 g 5  . aspirin 81 MG tablet Take 81 mg by mouth daily.    Marland Kitchen atorvastatin (LIPITOR) 20 MG tablet TAKE 1 TABLET (20 MG TOTAL) BY MOUTH DAILY. 90 tablet 3  . blood glucose meter kit  and supplies KIT Dispense based on patient and insurance preference. Use up to four times daily as directed. (FOR ICD-9 250.00, 250.01). 1 each 0  . Blood Glucose Monitoring Suppl (FREESTYLE FREEDOM LITE) w/Device KIT Use to check sugar twice a day.  Dx Code: E11.9 1 kit 0  . carvedilol (COREG) 3.125 MG tablet TAKE 1 TABLET BY MOUTH TWICE DAILY WITH MEAL. 180 tablet 1  . cetirizine (ZYRTEC) 10 MG tablet TAKE 1 TABLET BY MOUTH 2 TIMES DAILY. 200 tablet 1  . clobetasol ointment (TEMOVATE) 0.05 % APPLY A SMALL AMOUNT TO VULVAR AREA TWO TIMES DAILY FOR 2 WEEKS, THEN EVERY OTHER DAY FOR 2 WEEKS, THEN MONDAY, WEDNESDAY, FRIDAY FOR 2 WEEKS 90 g 0  . clobetasol ointment (TEMOVATE) 3.81 % APPLY 1 APPLICATION TOPICALLY 2 (TWO) TIMES DAILY. 60 g 2  . Continuous Blood Gluc Receiver (FREESTYLE LIBRE 2 READER) DEVI USE AS DIRECTED. 1 each 1  . Continuous Blood Gluc Sensor (DEXCOM G6 SENSOR) MISC Change every 10 days as directed 9 each PRN  . Continuous Blood Gluc Sensor (FREESTYLE LIBRE 2 SENSOR) MISC USE AS DIRECTED EVERY 14 DAYS. 6 each 3  . diclofenac Sodium (VOLTAREN) 1 % GEL Apply 2 g topically 4 (four) times daily. Rub into affected area of foot 2 to 4 times daily 100 g 2  . famotidine (PEPCID) 20 MG tablet TAKE 1 TABLET (20 MG TOTAL) BY MOUTH 2 (TWO) TIMES DAILY. 180 tablet 3  . gabapentin (NEURONTIN) 300 MG capsule Take 1 capsule (300 mg total) by mouth 3 (three) times daily. 90 capsule 1  . glimepiride (AMARYL) 4 MG tablet TAKE 1 TABLET (4 MG TOTAL) BY MOUTH IN THE MORNING AND AT BEDTIME. 180 tablet 1  . glucose blood (FREESTYLE LITE) test strip Use to check sugar twice a day.  Dx Code: E11.9 100 each 1  . glucose blood test strip USE 1 STRIP TO CHECK BLOOD GLUCOSE UP TO FOUR TIMES DAILY AS DIRECTED. (Patient taking differently: USE 1 STRIP TO CHECK BLOOD GLUCOSE UP TO FOUR TIMES DAILY AS DIRECTED.) 100 strip 0  . hydrochlorothiazide (HYDRODIURIL) 25 MG tablet TAKE 1 TABLET (25 MG TOTAL) BY MOUTH DAILY. 90  tablet 1  . insulin glargine (SEMGLEE) 100 UNIT/ML Solostar Pen Inject 55 Units into the skin every morning. And pen needles 2/day 60 mL 3  . Insulin Pen Needle 31G X 5 MM MISC USE W/ VICTOZA AND BASAGLAR 100 each 3  . Lancets (FREESTYLE) lancets Use to check sugar twice a day.  Dx Code: E11.9 100 each 1  . Lancets (FREESTYLE) lancets USE 1 TO CHECK BLOOD GLUCOSE UP TO FOUR TIMES DAILY AS DIRECTED. (Patient taking differently: USE 1 TO CHECK BLOOD GLUCOSE UP TO FOUR TIMES DAILY  AS DIRECTED.) 100 each 0  . liraglutide (VICTOZA) 18 MG/3ML SOPN Inject 1.8 mg into the skin daily. 9 mL 3  . meclizine (ANTIVERT) 25 MG tablet TAKE 1 TABLET (25 MG TOTAL) BY MOUTH 3 (THREE) TIMES DAILY AS NEEDED FOR DIZZINESS. 30 tablet 0  . metFORMIN (GLUCOPHAGE-XR) 500 MG 24 hr tablet TAKE 2 TABLETS BY MOUTH TWICE A DAY AND 1 TABLET AT NOON 450 tablet 0  . nitroGLYCERIN (NITROSTAT) 0.4 MG SL tablet Place 1 tablet (0.4 mg total) under the tongue every 5 (five) minutes x 3 doses as needed for chest pain. 40 tablet 1  . pantoprazole (PROTONIX) 40 MG tablet TAKE 1 TABLET (40 MG TOTAL) BY MOUTH DAILY. 90 tablet 1  . potassium chloride SA (KLOR-CON) 20 MEQ tablet TAKE 1 TABLET (20 MEQ TOTAL) BY MOUTH DAILY. 90 tablet 1  . promethazine (PHENERGAN) 25 MG tablet Take 1 tablet (25 mg total) by mouth every 8 (eight) hours as needed for nausea or vomiting. 20 tablet 0  . sitaGLIPtin (JANUVIA) 100 MG tablet TAKE 1 TABLET BY MOUTH DAILY. 30 tablet 5  . tiZANidine (ZANAFLEX) 4 MG tablet Take 1 tablet (4 mg total) by mouth every 6 (six) hours as needed for muscle spasms. 40 tablet 1  . traMADol (ULTRAM) 50 MG tablet Take 1 tablet (50 mg total) by mouth every 12 (twelve) hours as needed. 60 tablet 0  . Insulin Glargine-yfgn 100 UNIT/ML SOPN INJECT 55 UNITS INTO THE SKIN EVERY MORNING. 60 mL 3   No facility-administered medications prior to visit.    Allergies  Allergen Reactions  . Wilder Glade [Dapagliflozin] Hives    Review of Systems   Constitutional: Negative for fever and malaise/fatigue.  HENT: Negative for congestion.   Eyes: Negative for blurred vision.  Respiratory: Negative for shortness of breath.   Cardiovascular: Positive for chest pain. Negative for palpitations and leg swelling.  Gastrointestinal: Negative for abdominal pain, blood in stool and nausea.  Genitourinary: Negative for dysuria and frequency.  Musculoskeletal: Positive for myalgias. Negative for falls.  Skin: Negative for rash.  Neurological: Negative for dizziness, loss of consciousness and headaches.  Endo/Heme/Allergies: Negative for environmental allergies.  Psychiatric/Behavioral: Negative for depression. The patient is not nervous/anxious.        Objective:    Physical Exam Vitals and nursing note reviewed.  Constitutional:      General: She is not in acute distress.    Appearance: She is well-developed.  HENT:     Head: Normocephalic and atraumatic.     Nose: Nose normal.  Eyes:     General:        Right eye: No discharge.        Left eye: No discharge.  Cardiovascular:     Rate and Rhythm: Normal rate and regular rhythm.     Heart sounds: No murmur heard.   Pulmonary:     Effort: Pulmonary effort is normal.     Breath sounds: Normal breath sounds.  Abdominal:     General: Bowel sounds are normal.     Palpations: Abdomen is soft.     Tenderness: There is no abdominal tenderness.  Musculoskeletal:     Cervical back: Normal range of motion and neck supple.  Skin:    General: Skin is warm and dry.  Neurological:     Mental Status: She is alert and oriented to person, place, and time.     There were no vitals taken for this visit. Wt Readings from Last 3  Encounters:  12/17/20 252 lb 12.8 oz (114.7 kg)  07/28/20 248 lb 3.2 oz (112.6 kg)  04/21/20 246 lb 6 oz (111.8 kg)    Diabetic Foot Exam - Simple   No data filed    Lab Results  Component Value Date   WBC 11.7 (H) 01/14/2021   HGB 13.0 01/14/2021   HCT  40.0 01/14/2021   PLT 464.0 (H) 01/14/2021   GLUCOSE 59 (L) 01/14/2021   CHOL 239 (H) 01/14/2021   TRIG 174.0 (H) 01/14/2021   HDL 36.80 (L) 01/14/2021   LDLDIRECT 100.0 08/03/2017   LDLCALC 167 (H) 01/14/2021   ALT 15 01/14/2021   AST 16 01/14/2021   NA 139 01/14/2021   K 4.0 01/14/2021   CL 99 01/14/2021   CREATININE 0.70 01/14/2021   BUN 14 01/14/2021   CO2 32 01/14/2021   TSH 1.56 01/14/2021   HGBA1C 9.8 (A) 12/17/2020   MICROALBUR <0.7 01/14/2021    Lab Results  Component Value Date   TSH 1.56 01/14/2021   Lab Results  Component Value Date   WBC 11.7 (H) 01/14/2021   HGB 13.0 01/14/2021   HCT 40.0 01/14/2021   MCV 89.9 01/14/2021   PLT 464.0 (H) 01/14/2021   Lab Results  Component Value Date   NA 139 01/14/2021   K 4.0 01/14/2021   CO2 32 01/14/2021   GLUCOSE 59 (L) 01/14/2021   BUN 14 01/14/2021   CREATININE 0.70 01/14/2021   BILITOT 0.5 01/14/2021   ALKPHOS 83 01/14/2021   AST 16 01/14/2021   ALT 15 01/14/2021   PROT 6.9 01/14/2021   ALBUMIN 4.0 01/14/2021   CALCIUM 9.6 01/14/2021   GFR 95.48 01/14/2021   Lab Results  Component Value Date   CHOL 239 (H) 01/14/2021   Lab Results  Component Value Date   HDL 36.80 (L) 01/14/2021   Lab Results  Component Value Date   LDLCALC 167 (H) 01/14/2021   Lab Results  Component Value Date   TRIG 174.0 (H) 01/14/2021   Lab Results  Component Value Date   CHOLHDL 6 01/14/2021   Lab Results  Component Value Date   HGBA1C 9.8 (A) 12/17/2020       Assessment & Plan:   Problem List Items Addressed This Visit    Diabetes mellitus type 2 in obese (HCC)    hgba1c improving, she is following with endocrinology now.minimize simple carbs. Increase exercise as tolerated. Continue current meds she is now on Semglee 90 units daily      Relevant Orders   Comprehensive metabolic panel (Completed)   Microalbumin / creatinine urine ratio (Completed)   Ambulatory referral to Cardiology   Hyperlipidemia,  mixed    Encouraged heart healthy diet, increase exercise, avoid trans fats, consider a krill oil cap daily      Relevant Orders   Lipid panel (Completed)   Ambulatory referral to Cardiology   Obesity    Encouraged MIND diet, decrease po intake and increase exercise as tolerated. Needs 7-8 hours of sleep nightly. Avoid trans fats, eat small, frequent meals every 4-5 hours with lean proteins, complex carbs and healthy fats. Minimize simple carbs      Essential hypertension - Primary    Well controlled, no changes to meds. Encouraged heart healthy diet such as the DASH diet and exercise as tolerated.       Relevant Orders   CBC with Differential/Platelet (Completed)   TSH (Completed)   Ambulatory referral to Cardiology   Chest pain, atypical  Left upper anterior wall, infrequent and lasts minutes with no associated symptoms. Will refer back to cardiology for evaluation due to risk factors. She will seek care if symptoms return and do not resolve.      Relevant Orders   Ambulatory referral to Cardiology   Muscle spasm of left lower extremity    Mostly at night, intermittent. Encouraged moist heat and gentle stretching as tolerated. May try NSAIDs and prescription meds as directed and report if symptoms worsen or seek immediate care. Hydrate well. Check labs         I have discontinued Laticha Brogden's Insulin Glargine-yfgn. I am also having her maintain her aspirin, promethazine, nitroGLYCERIN, tiZANidine, Accu-Chek FastClix Lancets, atorvastatin, FreeStyle Freedom Lite, FREESTYLE LITE, freestyle, traMADol, cetirizine, gabapentin, diclofenac Sodium, blood glucose meter kit and supplies, Semglee, [employer] 2 Sensor, [employer] 2 Reader, metFORMIN, meclizine, potassium chloride SA, carvedilol, hydrochlorothiazide, glimepiride, pantoprazole, Insulin Pen Needle, clobetasol ointment, famotidine, albuterol, freestyle, glucose blood, clobetasol ointment, Victoza, Dexcom G6  Sensor, and sitaGLIPtin.  No orders of the defined types were placed in this encounter.    Penni Homans, MD

## 2021-01-16 NOTE — Assessment & Plan Note (Signed)
hgba1c improving, she is following with endocrinology now.minimize simple carbs. Increase exercise as tolerated. Continue current meds she is now on Semglee 90 units daily

## 2021-01-16 NOTE — Assessment & Plan Note (Signed)
Well controlled, no changes to meds. Encouraged heart healthy diet such as the DASH diet and exercise as tolerated.  °

## 2021-01-16 NOTE — Assessment & Plan Note (Signed)
Left upper anterior wall, infrequent and lasts minutes with no associated symptoms. Will refer back to cardiology for evaluation due to risk factors. She will seek care if symptoms return and do not resolve.

## 2021-01-16 NOTE — Assessment & Plan Note (Signed)
Encouraged MIND diet, decrease po intake and increase exercise as tolerated. Needs 7-8 hours of sleep nightly. Avoid trans fats, eat small, frequent meals every 4-5 hours with lean proteins, complex carbs and healthy fats. Minimize simple carbs

## 2021-01-16 NOTE — Assessment & Plan Note (Signed)
Mostly at night, intermittent. Encouraged moist heat and gentle stretching as tolerated. May try NSAIDs and prescription meds as directed and report if symptoms worsen or seek immediate care. Hydrate well. Check labs

## 2021-01-18 ENCOUNTER — Other Ambulatory Visit: Payer: Self-pay

## 2021-01-18 DIAGNOSIS — D72829 Elevated white blood cell count, unspecified: Secondary | ICD-10-CM

## 2021-01-20 ENCOUNTER — Other Ambulatory Visit (HOSPITAL_COMMUNITY): Payer: Self-pay

## 2021-01-20 ENCOUNTER — Other Ambulatory Visit: Payer: Self-pay

## 2021-01-20 MED ORDER — ROSUVASTATIN CALCIUM 5 MG PO TABS
5.0000 mg | ORAL_TABLET | Freq: Every day | ORAL | 3 refills | Status: DC
  Filled 2021-01-20: qty 30, 30d supply, fill #0
  Filled 2021-02-17: qty 30, 30d supply, fill #1
  Filled 2021-03-21: qty 30, 30d supply, fill #2
  Filled 2021-04-22: qty 30, 30d supply, fill #3

## 2021-01-21 ENCOUNTER — Other Ambulatory Visit (HOSPITAL_COMMUNITY): Payer: Self-pay

## 2021-01-21 ENCOUNTER — Telehealth: Payer: Self-pay | Admitting: Family Medicine

## 2021-01-21 MED FILL — Continuous Glucose System Sensor: 28 days supply | Qty: 2 | Fill #1 | Status: AC

## 2021-01-21 NOTE — Telephone Encounter (Signed)
FMLA paperwork faxed in  Placed into blyths folder up front

## 2021-01-22 NOTE — Telephone Encounter (Signed)
Faxed to [employer]

## 2021-02-02 ENCOUNTER — Ambulatory Visit: Payer: 59 | Admitting: Endocrinology

## 2021-02-02 ENCOUNTER — Other Ambulatory Visit (HOSPITAL_COMMUNITY): Payer: Self-pay

## 2021-02-02 ENCOUNTER — Other Ambulatory Visit: Payer: Self-pay

## 2021-02-02 VITALS — BP 120/84 | HR 88 | Ht 64.0 in | Wt 251.0 lb

## 2021-02-02 DIAGNOSIS — E669 Obesity, unspecified: Secondary | ICD-10-CM | POA: Diagnosis not present

## 2021-02-02 DIAGNOSIS — E1169 Type 2 diabetes mellitus with other specified complication: Secondary | ICD-10-CM | POA: Diagnosis not present

## 2021-02-02 MED ORDER — GLIMEPIRIDE 4 MG PO TABS: 2.0000 mg | ORAL_TABLET | Freq: Every day | ORAL | 3 refills | Status: DC

## 2021-02-02 MED ORDER — OZEMPIC (1 MG/DOSE) 4 MG/3ML ~~LOC~~ SOPN
2.0000 mg | PEN_INJECTOR | SUBCUTANEOUS | 3 refills | Status: DC
  Filled 2021-02-02: qty 9, 42d supply, fill #0

## 2021-02-02 MED ORDER — OZEMPIC (1 MG/DOSE) 4 MG/3ML ~~LOC~~ SOPN
1.0000 mg | PEN_INJECTOR | SUBCUTANEOUS | 3 refills | Status: DC
  Filled 2021-02-02: qty 9, 84d supply, fill #0

## 2021-02-02 NOTE — Patient Instructions (Addendum)
check your blood sugar twice a day.  vary the time of day when you check, between before the 3 meals, and at bedtime.  also check if you have symptoms of your blood sugar being too high or too low.  please keep a record of the readings and bring it to your next appointment here (or you can bring the meter itself).  You can write it on any piece of paper.  please call us sooner if your blood sugar goes below 70, or if most of your readings are over 200. We will need to take this complex situation in stages.    I have sent a prescription to your pharmacy, to change Victoza to [place].  Please reduce the glimepiride to 2 mg each morning, and none in the evening, and continue the same other medications.   Please come back for a follow-up appointment in 6 weeks. Eileen Richards

## 2021-02-02 NOTE — Progress Notes (Signed)
Subjective:    Patient ID: Eileen Richards, female    DOB: Mar 27, 1961, 60 y.o.   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  glucose varies from 70-230.  It is in general lowest in the middle of the night, but it can also be low at work.  It is in general highest at 10PM-MN. Past Medical History:  Diagnosis Date  . Achilles tendonitis 10/14/2012  . Asthma    childhood  . Atypical chest pain    normal stress echo 02/2009 -LV size was normal LV global systolic function was normal- Normal wall motion; no LV regional wall motion abnormalities  . Benign paroxysmal positional vertigo 04/26/2015  . Chest pain, atypical 08/24/2013  . Diabetes mellitus   . GERD (gastroesophageal reflux disease)   . Hand pain, left 11/10/2015  . Hyperlipemia   . Hypertension   . Neuropathic pain of ankle, left 04/27/2017  . Onychomycosis 04/17/2014  . Pain of right heel 10/14/2012  . Preventative health care 11/22/2015  . Sinusitis, acute 01/17/2016  . Thoracic outlet syndrome 11/03/2014  . Urinary urgency 12/15/2015    Past Surgical History:  Procedure Laterality Date  . ABDOMINAL HYSTERECTOMY    . CYSTOSCOPY  07/23/2012   Procedure: CYSTOSCOPY;  Surgeon: Marvene Staff, MD;  Location: [facility];  Service: Gynecology;  Laterality: N/A;  . DILATION AND CURETTAGE OF UTERUS     Lysis of Adhesions  . ROBOTIC ASSISTED LAPAROSCOPIC LYSIS OF ADHESION  07/23/2012   Procedure: ROBOTIC ASSISTED LAPAROSCOPIC LYSIS OF ADHESION;  Surgeon: Marvene Staff, MD;  Location: [facility];  Service: Gynecology;  Laterality:  N/A;  . ROBOTIC ASSISTED SALPINGO OOPHERECTOMY  07/23/2012   Procedure: ROBOTIC ASSISTED SALPINGO OOPHERECTOMY;  Surgeon: Marvene Staff, MD;  Location: [facility];  Service: Gynecology;  Laterality: Bilateral;    Social History   Socioeconomic History  . Marital status: Single    Spouse name: Not on file  . Number of children: Not on file  . Years of education: Not on file  . Highest education level: Not on file  Occupational History  . Not on file  Tobacco Use  . Smoking status: Former Research scientist (life sciences)  . Smokeless tobacco: Never Used  Vaping Use  . Vaping Use: Never used  Substance and Sexual Activity  . Alcohol use: No  . Drug use: No  . Sexual activity: Not on file  Other Topics Concern  . Not on file  Social History Narrative  . Not on file   Social Determinants of Health   Financial Resource Strain: Not on file  Food Insecurity: Not on file  Transportation Needs: Not on file  Physical Activity: Not on file  Stress: Not on file  Social Connections: Not on file  Intimate Partner Violence: Not on file    Current Outpatient Medications on File Prior to Visit  Medication Sig Dispense Refill  . Accu-Chek FastClix Lancets MISC USE AS DIRECTED TWICE DAILY TO CHECK BLOOD SUGAR. DX E11.9 102 each 6  . albuterol (VENTOLIN HFA) 108 (90 Base) MCG/ACT inhaler INHALE 2 PUFFS INTO THE LUNGS EVERY 6 (SIX) HOURS AS NEEDED FOR WHEEZING OR  SHORTNESS OF BREATH. 18 g 5  . aspirin 81 MG tablet Take 81 mg by mouth daily.    . blood glucose meter kit and supplies KIT Dispense based on patient and insurance preference. Use up to four times daily as directed. (FOR ICD-9 250.00, 250.01). 1 each 0  . Blood Glucose Monitoring Suppl (FREESTYLE FREEDOM LITE) w/Device KIT Use to check sugar twice a day.  Dx Code: E11.9 1 kit 0  . carvedilol (COREG) 3.125 MG tablet TAKE 1 TABLET BY MOUTH TWICE DAILY WITH MEAL. 180 tablet 1  . cetirizine (ZYRTEC) 10 MG tablet TAKE 1 TABLET BY MOUTH 2 TIMES DAILY. 200  tablet 1  . clobetasol ointment (TEMOVATE) 0.05 % APPLY A SMALL AMOUNT TO VULVAR AREA TWO TIMES DAILY FOR 2 WEEKS, THEN EVERY OTHER DAY FOR 2 WEEKS, THEN MONDAY, WEDNESDAY, FRIDAY FOR 2 WEEKS 90 g 0  . clobetasol ointment (TEMOVATE) 2.86 % APPLY 1 APPLICATION TOPICALLY 2 (TWO) TIMES DAILY. 60 g 2  . Continuous Blood Gluc Receiver (FREESTYLE LIBRE 2 READER) DEVI USE AS DIRECTED. 1 each 1  . Continuous Blood Gluc Sensor (DEXCOM G6 SENSOR) MISC Change every 10 days as directed 9 each PRN  . Continuous Blood Gluc Sensor (FREESTYLE LIBRE 2 SENSOR) MISC USE AS DIRECTED EVERY 14 DAYS. 6 each 3  . COVID-19 mRNA Vac-TriS, Pfizer, (PFIZER-BIONT COVID-19 VAC-TRIS) SUSP injection Inject into the muscle. 0.3 mL 0  . diclofenac Sodium (VOLTAREN) 1 % GEL Apply 2 g topically 4 (four) times daily. Rub into affected area of foot 2 to 4 times daily 100 g 2  . famotidine (PEPCID) 20 MG tablet TAKE 1 TABLET (20 MG TOTAL) BY MOUTH 2 (TWO) TIMES DAILY. 180 tablet 3  . gabapentin (NEURONTIN) 300 MG capsule Take 1 capsule (300 mg total) by mouth 3 (three) times daily. 90 capsule 1  . glucose blood (FREESTYLE LITE) test strip Use to check sugar twice a day.  Dx Code: E11.9 100 each 1  . glucose blood test strip USE 1 STRIP TO CHECK BLOOD GLUCOSE UP TO FOUR TIMES DAILY AS DIRECTED. (Patient taking differently: USE 1 STRIP TO CHECK BLOOD GLUCOSE UP TO FOUR TIMES DAILY AS DIRECTED.) 100 strip 0  . hydrochlorothiazide (HYDRODIURIL) 25 MG tablet TAKE 1 TABLET (25 MG TOTAL) BY MOUTH DAILY. 90 tablet 1  . insulin glargine (SEMGLEE) 100 UNIT/ML Solostar Pen Inject 55 Units into the skin every morning. And pen needles 2/day 60 mL 3  . Insulin Pen Needle 31G X 5 MM MISC USE W/ VICTOZA AND BASAGLAR 100 each 3  . Lancets (FREESTYLE) lancets Use to check sugar twice a day.  Dx Code: E11.9 100 each 1  . Lancets (FREESTYLE) lancets USE 1 TO CHECK BLOOD GLUCOSE UP TO FOUR TIMES DAILY AS DIRECTED. (Patient taking differently: USE 1 TO CHECK  BLOOD GLUCOSE UP TO FOUR TIMES DAILY AS DIRECTED.) 100 each 0  . meclizine (ANTIVERT) 25 MG tablet TAKE 1 TABLET (25 MG TOTAL) BY MOUTH 3 (THREE) TIMES DAILY AS NEEDED FOR DIZZINESS. 30 tablet 0  . metFORMIN (GLUCOPHAGE-XR) 500 MG 24 hr tablet TAKE 2 TABLETS BY MOUTH TWICE A DAY AND 1 TABLET AT NOON 450 tablet 0  . nitroGLYCERIN (NITROSTAT) 0.4 MG SL tablet Place 1 tablet (0.4 mg total) under the tongue every 5 (five) minutes x 3 doses as needed for chest pain. 40 tablet 1  . pantoprazole (PROTONIX) 40 MG tablet TAKE 1 TABLET (40 MG TOTAL) BY MOUTH DAILY. 90 tablet 1  .  potassium chloride SA (KLOR-CON) 20 MEQ tablet TAKE 1 TABLET (20 MEQ TOTAL) BY MOUTH DAILY. 90 tablet 1  . promethazine (PHENERGAN) 25 MG tablet Take 1 tablet (25 mg total) by mouth every 8 (eight) hours as needed for nausea or vomiting. 20 tablet 0  . rosuvastatin (CRESTOR) 5 MG tablet Take 1 tablet (5 mg total) by mouth daily. 30 tablet 3  . sitaGLIPtin (JANUVIA) 100 MG tablet TAKE 1 TABLET BY MOUTH DAILY. 30 tablet 5  . tiZANidine (ZANAFLEX) 4 MG tablet Take 1 tablet (4 mg total) by mouth every 6 (six) hours as needed for muscle spasms. 40 tablet 1  . traMADol (ULTRAM) 50 MG tablet Take 1 tablet (50 mg total) by mouth every 12 (twelve) hours as needed. 60 tablet 0   No current facility-administered medications on file prior to visit.    Allergies  Allergen Reactions  . Farxiga [Dapagliflozin] Hives    Family History  Problem Relation Age of Onset  . GER disease Mother   . GER disease Father   . Heart disease Father        father has "heart problems"  . Anemia Father   . Asthma Other   . Hypertension Other   . Diabetes Other     BP 120/84 (BP Location: Right Arm, Patient Position: Sitting, Cuff Size: Large)   Pulse 88   Ht $R'5\' 4"'CM$  (1.626 m)   Wt 251 lb (113.9 kg)   SpO2 97%   BMI 43.08 kg/m   Review of Systems Denies LOC    Objective:   Physical Exam    Lab Results  Component Value Date   HGBA1C 9.8  (A) 12/17/2020       Assessment & Plan:  Insulin-requiring type 2 DM: uncontrolled.   Patient Instructions  check your blood sugar twice a day.  vary the time of day when you check, between before the 3 meals, and at bedtime.  also check if you have symptoms of your blood sugar being too high or too low.  please keep a record of the readings and bring it to your next appointment here (or you can bring the meter itself).  You can write it on any piece of paper.  please call us sooner if your blood sugar goes below 70, or if most of your readings are over 200. We will need to take this complex situation in stages.    I have sent a prescription to your pharmacy, to change Victoza to [place].  Please reduce the glimepiride to 2 mg each morning, and none in the evening, and continue the same other medications.   Please come back for a follow-up appointment in 6 weeks. Marland Kitchen

## 2021-02-03 ENCOUNTER — Other Ambulatory Visit (HOSPITAL_COMMUNITY): Payer: Self-pay

## 2021-02-04 ENCOUNTER — Other Ambulatory Visit (HOSPITAL_COMMUNITY): Payer: Self-pay

## 2021-02-04 MED FILL — Insulin Pen Needle 31 G X 5 MM (1/5" or 3/16"): 50 days supply | Qty: 100 | Fill #1 | Status: CN

## 2021-02-08 ENCOUNTER — Other Ambulatory Visit (HOSPITAL_COMMUNITY): Payer: Self-pay

## 2021-02-08 MED FILL — Famotidine Tab 20 MG: ORAL | 90 days supply | Qty: 180 | Fill #0 | Status: AC

## 2021-02-09 ENCOUNTER — Other Ambulatory Visit (HOSPITAL_COMMUNITY): Payer: Self-pay

## 2021-02-09 MED FILL — Insulin Pen Needle 31 G X 5 MM (1/5" or 3/16"): 50 days supply | Qty: 100 | Fill #1 | Status: AC

## 2021-02-10 ENCOUNTER — Other Ambulatory Visit (HOSPITAL_COMMUNITY): Payer: Self-pay

## 2021-02-17 ENCOUNTER — Other Ambulatory Visit (INDEPENDENT_AMBULATORY_CARE_PROVIDER_SITE_OTHER): Payer: 59

## 2021-02-17 ENCOUNTER — Other Ambulatory Visit: Payer: Self-pay

## 2021-02-17 DIAGNOSIS — D72829 Elevated white blood cell count, unspecified: Secondary | ICD-10-CM

## 2021-02-17 MED FILL — Potassium Chloride Microencapsulated Crys ER Tab 20 mEq: ORAL | 90 days supply | Qty: 90 | Fill #0 | Status: AC

## 2021-02-18 ENCOUNTER — Other Ambulatory Visit: Payer: Self-pay

## 2021-02-18 ENCOUNTER — Other Ambulatory Visit (HOSPITAL_COMMUNITY): Payer: Self-pay

## 2021-02-18 DIAGNOSIS — D72829 Elevated white blood cell count, unspecified: Secondary | ICD-10-CM

## 2021-02-18 DIAGNOSIS — R7989 Other specified abnormal findings of blood chemistry: Secondary | ICD-10-CM

## 2021-02-18 LAB — CBC WITH DIFFERENTIAL/PLATELET
Basophils Absolute: 0.1 10*3/uL (ref 0.0–0.1)
Basophils Relative: 0.8 % (ref 0.0–3.0)
Eosinophils Absolute: 0.3 10*3/uL (ref 0.0–0.7)
Eosinophils Relative: 2.4 % (ref 0.0–5.0)
HCT: 39.9 % (ref 36.0–46.0)
Hemoglobin: 12.8 g/dL (ref 12.0–15.0)
Lymphocytes Relative: 20.7 % (ref 12.0–46.0)
Lymphs Abs: 2.8 10*3/uL (ref 0.7–4.0)
MCHC: 32.1 g/dL (ref 30.0–36.0)
MCV: 89.3 fl (ref 78.0–100.0)
Monocytes Absolute: 0.7 10*3/uL (ref 0.1–1.0)
Monocytes Relative: 5 % (ref 3.0–12.0)
Neutro Abs: 9.6 10*3/uL — ABNORMAL HIGH (ref 1.4–7.7)
Neutrophils Relative %: 71.1 % (ref 43.0–77.0)
Platelets: 481 10*3/uL — ABNORMAL HIGH (ref 150.0–400.0)
RBC: 4.47 Mil/uL (ref 3.87–5.11)
RDW: 14.1 % (ref 11.5–15.5)
WBC: 13.5 10*3/uL — ABNORMAL HIGH (ref 4.0–10.5)

## 2021-02-22 ENCOUNTER — Other Ambulatory Visit (HOSPITAL_COMMUNITY): Payer: Self-pay

## 2021-02-22 MED FILL — Continuous Glucose System Sensor: 28 days supply | Qty: 2 | Fill #2 | Status: AC

## 2021-03-04 MED FILL — Hydrochlorothiazide Tab 25 MG: ORAL | 90 days supply | Qty: 90 | Fill #0 | Status: AC

## 2021-03-04 MED FILL — Pantoprazole Sodium EC Tab 40 MG (Base Equiv): ORAL | 90 days supply | Qty: 90 | Fill #0 | Status: AC

## 2021-03-05 ENCOUNTER — Other Ambulatory Visit (HOSPITAL_COMMUNITY): Payer: Self-pay

## 2021-03-12 MED FILL — Carvedilol Tab 3.125 MG: ORAL | 90 days supply | Qty: 180 | Fill #0 | Status: AC

## 2021-03-15 ENCOUNTER — Other Ambulatory Visit (HOSPITAL_COMMUNITY): Payer: Self-pay

## 2021-03-18 MED FILL — Continuous Glucose System Sensor: 28 days supply | Qty: 2 | Fill #3 | Status: AC

## 2021-03-19 ENCOUNTER — Other Ambulatory Visit (HOSPITAL_COMMUNITY): Payer: Self-pay

## 2021-03-19 DIAGNOSIS — H524 Presbyopia: Secondary | ICD-10-CM | POA: Diagnosis not present

## 2021-03-19 DIAGNOSIS — E119 Type 2 diabetes mellitus without complications: Secondary | ICD-10-CM | POA: Diagnosis not present

## 2021-03-19 DIAGNOSIS — H0102A Squamous blepharitis right eye, upper and lower eyelids: Secondary | ICD-10-CM | POA: Diagnosis not present

## 2021-03-19 DIAGNOSIS — H16223 Keratoconjunctivitis sicca, not specified as Sjogren's, bilateral: Secondary | ICD-10-CM | POA: Diagnosis not present

## 2021-03-19 DIAGNOSIS — H0102B Squamous blepharitis left eye, upper and lower eyelids: Secondary | ICD-10-CM | POA: Diagnosis not present

## 2021-03-19 DIAGNOSIS — H35033 Hypertensive retinopathy, bilateral: Secondary | ICD-10-CM | POA: Diagnosis not present

## 2021-03-19 DIAGNOSIS — H5211 Myopia, right eye: Secondary | ICD-10-CM | POA: Diagnosis not present

## 2021-03-19 DIAGNOSIS — H40013 Open angle with borderline findings, low risk, bilateral: Secondary | ICD-10-CM | POA: Diagnosis not present

## 2021-03-19 DIAGNOSIS — H52223 Regular astigmatism, bilateral: Secondary | ICD-10-CM | POA: Diagnosis not present

## 2021-03-19 LAB — HM DIABETES EYE EXAM

## 2021-03-23 ENCOUNTER — Encounter: Payer: Self-pay | Admitting: *Deleted

## 2021-03-23 ENCOUNTER — Other Ambulatory Visit: Payer: Self-pay | Admitting: Family Medicine

## 2021-03-23 ENCOUNTER — Other Ambulatory Visit (HOSPITAL_COMMUNITY): Payer: Self-pay

## 2021-03-23 ENCOUNTER — Telehealth: Payer: Self-pay | Admitting: Endocrinology

## 2021-03-24 ENCOUNTER — Ambulatory Visit: Payer: 59 | Admitting: Endocrinology

## 2021-03-24 ENCOUNTER — Other Ambulatory Visit: Payer: Self-pay

## 2021-03-24 ENCOUNTER — Other Ambulatory Visit (HOSPITAL_COMMUNITY): Payer: Self-pay

## 2021-03-24 VITALS — BP 126/70 | HR 83 | Ht 60.0 in | Wt 250.6 lb

## 2021-03-24 DIAGNOSIS — E1169 Type 2 diabetes mellitus with other specified complication: Secondary | ICD-10-CM | POA: Diagnosis not present

## 2021-03-24 DIAGNOSIS — E669 Obesity, unspecified: Secondary | ICD-10-CM

## 2021-03-24 LAB — POCT GLYCOSYLATED HEMOGLOBIN (HGB A1C): Hemoglobin A1C: 7 % — AB (ref 4.0–5.6)

## 2021-03-24 MED ORDER — GLIMEPIRIDE 1 MG PO TABS
1.0000 mg | ORAL_TABLET | Freq: Every day | ORAL | 3 refills | Status: DC
  Filled 2021-03-24: qty 90, 90d supply, fill #0

## 2021-03-24 MED ORDER — OZEMPIC (2 MG/DOSE) 8 MG/3ML ~~LOC~~ SOPN
2.0000 mg | PEN_INJECTOR | SUBCUTANEOUS | 3 refills | Status: DC
Start: 2021-03-24 — End: 2022-01-17
  Filled 2021-03-24: qty 9, 84d supply, fill #0
  Filled 2021-07-05 – 2021-07-06 (×2): qty 3, 28d supply, fill #1
  Filled 2021-08-03: qty 3, 28d supply, fill #2
  Filled 2021-09-01: qty 3, 28d supply, fill #3
  Filled 2021-10-04: qty 9, 84d supply, fill #4
  Filled 2022-01-04: qty 9, 84d supply, fill #5

## 2021-03-24 NOTE — Patient Instructions (Addendum)
check your blood sugar twice a day.  vary the time of day when you check, between before the 3 meals, and at bedtime.  also check if you have symptoms of your blood sugar being too high or too low.  please keep a record of the readings and bring it to your next appointment here (or you can bring the meter itself).  You can write it on any piece of paper.  please call us sooner if your blood sugar goes below 70, or if most of your readings are over 200. We will need to take this complex situation in stages.    I have sent a prescription to your pharmacy, to double the Ozempic, and: You can stop taking the Januvia, and: Please reduce the glimepiride to 2 mg each morning, and none in the evening, and:  continue the same other medications.  If the blood sugar goes low, our plan will be to stop the glimepiride.  Please come back for a follow-up appointment in 2 months.

## 2021-03-24 NOTE — Progress Notes (Signed)
Subjective:    Patient ID: Eileen Richards, female    DOB: 12-03-1960, 60 y.o.   MRN: [id number]  HPI Pt returns for f/u of diabetes mellitus: DM type: Insulin-requiring type 2 Dx'ed: 4401 Complications: none Therapy: insulin since 2018, Ozempic, and 3 oral meds.   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   Other: she takes QD insulin, at least for now Interval history: I reviewed continuous glucose monitor data.  glucose varies from 75-200 (but pt says can be as low as 68).  There is little or no trend throughout the day.  Past Medical History:  Diagnosis Date   Achilles tendonitis 10/14/2012   Asthma    childhood   Atypical chest pain    normal stress echo 02/2009 -LV size was normal LV global systolic function was normal- Normal wall motion; no LV regional wall motion abnormalities   Benign paroxysmal positional vertigo 04/26/2015   Chest pain, atypical 08/24/2013   Diabetes mellitus    GERD (gastroesophageal reflux disease)    Hand pain, left 11/10/2015   Hyperlipemia    Hypertension    Neuropathic pain of ankle, left 04/27/2017   Onychomycosis 04/17/2014   Pain of right heel 10/14/2012   Preventative health care 11/22/2015   Sinusitis, acute 01/17/2016   Thoracic outlet syndrome 11/03/2014   Urinary urgency 12/15/2015    Past Surgical History:  Procedure Laterality Date   ABDOMINAL HYSTERECTOMY     CYSTOSCOPY  07/23/2012   Procedure: CYSTOSCOPY;  Surgeon: Marvene Staff, MD;  Location: [facility];  Service: Gynecology;  Laterality: N/A;   DILATION AND CURETTAGE OF UTERUS     Lysis of Adhesions   ROBOTIC ASSISTED LAPAROSCOPIC LYSIS OF ADHESION  07/23/2012   Procedure: ROBOTIC ASSISTED LAPAROSCOPIC LYSIS OF ADHESION;  Surgeon: Marvene Staff, MD;  Location: [facility];  Service: Gynecology;  Laterality: N/A;   ROBOTIC ASSISTED  SALPINGO OOPHERECTOMY  07/23/2012   Procedure: ROBOTIC ASSISTED SALPINGO OOPHERECTOMY;  Surgeon: Marvene Staff, MD;  Location: [facility];  Service: Gynecology;  Laterality: Bilateral;    Social History   Socioeconomic History   Marital status: Single    Spouse name: Not on file   Number of children: Not on file   Years of education: Not on file   Highest education level: Not on file  Occupational History   Not on file  Tobacco Use   Smoking status: Former    Pack years: 0.00   Smokeless tobacco: Never  Vaping Use   Vaping Use: Never used  Substance and Sexual Activity   Alcohol use: No   Drug use: No   Sexual activity: Not on file  Other Topics Concern   Not on file  Social History Narrative   Not on file   Social Determinants of Health   Financial Resource Strain: Not on file  Food Insecurity: Not on file  Transportation Needs: Not on file  Physical Activity: Not on file  Stress: Not on file  Social Connections: Not on file  Intimate Partner Violence: Not on file    Current Outpatient Medications on File Prior to Visit  Medication Sig Dispense Refill   Accu-Chek FastClix Lancets MISC USE AS DIRECTED TWICE DAILY TO CHECK BLOOD SUGAR. DX E11.9 102 each 6   albuterol (VENTOLIN HFA) 108 (90 Base) MCG/ACT inhaler INHALE 2 PUFFS INTO THE LUNGS EVERY 6 (SIX) HOURS AS  NEEDED FOR WHEEZING OR SHORTNESS OF BREATH. 18 g 5   aspirin 81 MG tablet Take 81 mg by mouth daily.     blood glucose meter kit and supplies KIT Dispense based on patient and insurance preference. Use up to four times daily as directed. (FOR ICD-9 250.00, 250.01). 1 each 0   Blood Glucose Monitoring Suppl (FREESTYLE FREEDOM LITE) w/Device KIT Use to check sugar twice a day.  Dx Code: E11.9 1 kit 0   carvedilol (COREG) 3.125 MG tablet TAKE 1 TABLET BY MOUTH TWICE DAILY WITH MEAL. 180 tablet 1   cetirizine (ZYRTEC) 10 MG tablet TAKE 1 TABLET BY MOUTH 2 TIMES DAILY. 200 tablet 1   clobetasol ointment  (TEMOVATE) 0.05 % APPLY A SMALL AMOUNT TO VULVAR AREA TWO TIMES DAILY FOR 2 WEEKS, THEN EVERY OTHER DAY FOR 2 WEEKS, THEN MONDAY, WEDNESDAY, FRIDAY FOR 2 WEEKS 90 g 0   clobetasol ointment (TEMOVATE) 0.24 % APPLY 1 APPLICATION TOPICALLY 2 (TWO) TIMES DAILY. 60 g 2   Continuous Blood Gluc Receiver (FREESTYLE LIBRE 2 READER) DEVI USE AS DIRECTED. 1 each 1   Continuous Blood Gluc Sensor (DEXCOM G6 SENSOR) MISC Change every 10 days as directed 9 each PRN   Continuous Blood Gluc Sensor (FREESTYLE LIBRE 2 SENSOR) MISC USE AS DIRECTED EVERY 14 DAYS. 6 each 3   COVID-19 mRNA Vac-TriS, Pfizer, (PFIZER-BIONT COVID-19 VAC-TRIS) SUSP injection Inject into the muscle. 0.3 mL 0   diclofenac Sodium (VOLTAREN) 1 % GEL Apply 2 g topically 4 (four) times daily. Rub into affected area of foot 2 to 4 times daily 100 g 2   famotidine (PEPCID) 20 MG tablet TAKE 1 TABLET (20 MG TOTAL) BY MOUTH 2 (TWO) TIMES DAILY. 180 tablet 3   gabapentin (NEURONTIN) 300 MG capsule Take 1 capsule (300 mg total) by mouth 3 (three) times daily. 90 capsule 1   glucose blood (FREESTYLE LITE) test strip Use to check sugar twice a day.  Dx Code: E11.9 100 each 1   glucose blood test strip USE 1 STRIP TO CHECK BLOOD GLUCOSE UP TO FOUR TIMES DAILY AS DIRECTED. (Patient taking differently: USE 1 STRIP TO CHECK BLOOD GLUCOSE UP TO FOUR TIMES DAILY AS DIRECTED.) 100 strip 0   hydrochlorothiazide (HYDRODIURIL) 25 MG tablet TAKE 1 TABLET (25 MG TOTAL) BY MOUTH DAILY. 90 tablet 1   insulin glargine (SEMGLEE) 100 UNIT/ML Solostar Pen Inject 55 Units into the skin every morning. And pen needles 2/day 60 mL 3   Insulin Pen Needle 31G X 5 MM MISC USE W/ VICTOZA AND BASAGLAR 100 each 3   Lancets (FREESTYLE) lancets Use to check sugar twice a day.  Dx Code: E11.9 100 each 1   meclizine (ANTIVERT) 25 MG tablet TAKE 1 TABLET (25 MG TOTAL) BY MOUTH 3 (THREE) TIMES DAILY AS NEEDED FOR DIZZINESS. 30 tablet 0   metFORMIN (GLUCOPHAGE-XR) 500 MG 24 hr tablet TAKE 2  TABLETS BY MOUTH TWICE A DAY AND 1 TABLET AT NOON 450 tablet 0   nitroGLYCERIN (NITROSTAT) 0.4 MG SL tablet Place 1 tablet (0.4 mg total) under the tongue every 5 (five) minutes x 3 doses as needed for chest pain. 40 tablet 1   pantoprazole (PROTONIX) 40 MG tablet TAKE 1 TABLET (40 MG TOTAL) BY MOUTH DAILY. 90 tablet 1   potassium chloride SA (KLOR-CON) 20 MEQ tablet TAKE 1 TABLET (20 MEQ TOTAL) BY MOUTH DAILY. 90 tablet 1   promethazine (PHENERGAN) 25 MG tablet Take 1 tablet (25 mg total)  by mouth every 8 (eight) hours as needed for nausea or vomiting. 20 tablet 0   rosuvastatin (CRESTOR) 5 MG tablet Take 1 tablet (5 mg total) by mouth daily. 30 tablet 3   tiZANidine (ZANAFLEX) 4 MG tablet Take 1 tablet (4 mg total) by mouth every 6 (six) hours as needed for muscle spasms. 40 tablet 1   traMADol (ULTRAM) 50 MG tablet Take 1 tablet (50 mg total) by mouth every 12 (twelve) hours as needed. 60 tablet 0   No current facility-administered medications on file prior to visit.    Allergies  Allergen Reactions   Farxiga [Dapagliflozin] Hives    Family History  Problem Relation Age of Onset   GER disease Mother    GER disease Father    Heart disease Father        father has "heart problems"   Anemia Father    Asthma Other    Hypertension Other    Diabetes Other     BP 126/70 (BP Location: Right Arm, Patient Position: Sitting, Cuff Size: Normal)   Pulse 83   Ht 5' (1.524 m)   Wt 250 lb 9.6 oz (113.7 kg)   SpO2 97%   BMI 48.94 kg/m     Review of Systems She denies hypoglycemia/n/v.      Objective:   Physical Exam Pulses: dorsalis pedis intact bilat.   MSK: no deformity of the feet CV: 1+ bilat leg edema Skin:  no ulcer on the feet.  normal color and temp on the feet.   Neuro: sensation is intact to touch on the feet.   Ext: there is bilateral onychomycosis of the toenails  Lab Results  Component Value Date   HGBA1C 7.0 (A) 03/24/2021       Assessment & Plan:   Insulin-requiring type 2 DM Hypoglycemia, due to insulin and glimepiride.  We'll favor GLP rx.   Patient Instructions  check your blood sugar twice a day.  vary the time of day when you check, between before the 3 meals, and at bedtime.  also check if you have symptoms of your blood sugar being too high or too low.  please keep a record of the readings and bring it to your next appointment here (or you can bring the meter itself).  You can write it on any piece of paper.  please call us sooner if your blood sugar goes below 70, or if most of your readings are over 200. We will need to take this complex situation in stages.    I have sent a prescription to your pharmacy, to double the Ozempic, and: You can stop taking the Januvia, and: Please reduce the glimepiride to 2 mg each morning, and none in the evening, and:  continue the same other medications.  If the blood sugar goes low, our plan will be to stop the glimepiride.  Please come back for a follow-up appointment in 2 months.

## 2021-03-25 ENCOUNTER — Other Ambulatory Visit (INDEPENDENT_AMBULATORY_CARE_PROVIDER_SITE_OTHER): Payer: 59

## 2021-03-25 DIAGNOSIS — D72829 Elevated white blood cell count, unspecified: Secondary | ICD-10-CM

## 2021-03-25 DIAGNOSIS — R7989 Other specified abnormal findings of blood chemistry: Secondary | ICD-10-CM

## 2021-03-26 ENCOUNTER — Other Ambulatory Visit: Payer: Self-pay | Admitting: *Deleted

## 2021-03-26 DIAGNOSIS — D72829 Elevated white blood cell count, unspecified: Secondary | ICD-10-CM

## 2021-03-26 LAB — CBC WITH DIFFERENTIAL/PLATELET
Basophils Absolute: 0.1 10*3/uL (ref 0.0–0.1)
Basophils Relative: 1.2 % (ref 0.0–3.0)
Eosinophils Absolute: 0.3 10*3/uL (ref 0.0–0.7)
Eosinophils Relative: 2.2 % (ref 0.0–5.0)
HCT: 37.5 % (ref 36.0–46.0)
Hemoglobin: 12.6 g/dL (ref 12.0–15.0)
Lymphocytes Relative: 22.1 % (ref 12.0–46.0)
Lymphs Abs: 2.7 10*3/uL (ref 0.7–4.0)
MCHC: 33.5 g/dL (ref 30.0–36.0)
MCV: 89.5 fl (ref 78.0–100.0)
Monocytes Absolute: 0.6 10*3/uL (ref 0.1–1.0)
Monocytes Relative: 5.2 % (ref 3.0–12.0)
Neutro Abs: 8.4 10*3/uL — ABNORMAL HIGH (ref 1.4–7.7)
Neutrophils Relative %: 69.3 % (ref 43.0–77.0)
Platelets: 476 10*3/uL — ABNORMAL HIGH (ref 150.0–400.0)
RBC: 4.19 Mil/uL (ref 3.87–5.11)
RDW: 14.6 % (ref 11.5–15.5)
WBC: 12.1 10*3/uL — ABNORMAL HIGH (ref 4.0–10.5)

## 2021-03-27 ENCOUNTER — Encounter: Payer: Self-pay | Admitting: Family Medicine

## 2021-03-29 ENCOUNTER — Other Ambulatory Visit (HOSPITAL_COMMUNITY): Payer: Self-pay

## 2021-03-29 ENCOUNTER — Other Ambulatory Visit: Payer: Self-pay | Admitting: Family Medicine

## 2021-03-29 DIAGNOSIS — M25561 Pain in right knee: Secondary | ICD-10-CM

## 2021-03-31 ENCOUNTER — Ambulatory Visit (HOSPITAL_BASED_OUTPATIENT_CLINIC_OR_DEPARTMENT_OTHER)
Admission: RE | Admit: 2021-03-31 | Discharge: 2021-03-31 | Disposition: A | Discharge status: Home / Self Care | Payer: 59 | Source: Ambulatory Visit | Attending: Family Medicine | Admitting: Family Medicine

## 2021-03-31 ENCOUNTER — Other Ambulatory Visit: Payer: Self-pay

## 2021-03-31 DIAGNOSIS — M25561 Pain in right knee: Secondary | ICD-10-CM | POA: Insufficient documentation

## 2021-04-03 ENCOUNTER — Other Ambulatory Visit: Payer: Self-pay | Admitting: Family Medicine

## 2021-04-03 DIAGNOSIS — M25461 Effusion, right knee: Secondary | ICD-10-CM

## 2021-04-03 DIAGNOSIS — M25561 Pain in right knee: Secondary | ICD-10-CM

## 2021-04-07 ENCOUNTER — Other Ambulatory Visit (HOSPITAL_COMMUNITY): Payer: Self-pay

## 2021-04-07 NOTE — Telephone Encounter (Signed)
Patient would like a call back

## 2021-04-07 NOTE — Telephone Encounter (Signed)
Left message on machine to call back  Rx is from Dr. Loanne Drilling.  Not sure what is going on.  Had to leave message for patient to call back.

## 2021-04-07 NOTE — Telephone Encounter (Signed)
Pt called the [facility] location and said her Semglee was canceled by Dr Charlett Blake and it should not have been. Said this Rx is done by Dr Loanne Drilling. Pl needs new Rx called in to [facility]

## 2021-04-08 ENCOUNTER — Other Ambulatory Visit (HOSPITAL_COMMUNITY): Payer: Self-pay

## 2021-04-08 ENCOUNTER — Ambulatory Visit: Payer: 59 | Admitting: Physician Assistant

## 2021-04-08 DIAGNOSIS — M25561 Pain in right knee: Secondary | ICD-10-CM

## 2021-04-08 MED ORDER — INSULIN GLARGINE-YFGN 100 UNIT/ML ~~LOC~~ SOPN
55.0000 [IU] | PEN_INJECTOR | SUBCUTANEOUS | 3 refills | Status: DC
  Filled 2021-04-08: qty 48, 87d supply, fill #0
  Filled 2021-07-15: qty 48, 87d supply, fill #1
  Filled 2021-10-14: qty 48, 87d supply, fill #2

## 2021-04-08 MED ORDER — MELOXICAM 15 MG PO TABS
15.0000 mg | ORAL_TABLET | Freq: Every day | ORAL | 0 refills | Status: DC
  Filled 2021-04-08: qty 30, 30d supply, fill #0

## 2021-04-08 NOTE — Telephone Encounter (Signed)
Patient stated that we canceled rx for semglee. Looks like Dr. Charlett Blake accidentally canceled.  Rx sent in but will get refills from Dr. Loanne Drilling.

## 2021-04-08 NOTE — Progress Notes (Signed)
Office Visit Note   Patient: Eileen Richards           Date of Birth: Feb 06, 1961           MRN: [id number] Visit Date: 04/08/2021              Requested by: Mosie Lukes, MD [address] [place],  [place] [address] PCP: Mosie Lukes, MD  Chief Complaint  Patient presents with   Right Knee - Pain      HPI: Patient is a pleasant 60 year old woman who works in Water engineer at [employer] who presents with a history of anterior knee pain times several months.  She thinks this for started when she fell onto her knee.  She has had x-rays which did not demonstrate any kind of fracture.  She says it is especially noticeable when she ascends and descends stairs.  She has tried topical Voltaren gel without much improvement she denies any instability  Assessment & Plan: Visit Diagnoses: No diagnosis found.  Plan: Patient was demonstrated close chain quadricep strengthening exercises she will do this 3 times a day.  Also will start her on a course of meloxicam with breakfast.  She knows not to take other anti-inflammatories with it.  She will follow-up in 1 month.  Follow-Up Instructions: No follow-ups on file.   Ortho Exam  Patient is alert, oriented, no adenopathy, well-dressed, normal affect, normal respiratory effort. Examination she has no effusion mild warmth but no cellulitis.  She is focally tender around the medial border the patella and slightly on the medial joint line.  No lateral tenderness.  Good endpoint on anterior draw.  Good varus valgus stability.  No evidence of an infective process  Imaging: No results found. No images are attached to the encounter.  Labs: Lab Results  Component Value Date   HGBA1C 7.0 (A) 03/24/2021   HGBA1C 9.8 (A) 12/17/2020   HGBA1C 10.6 (H) 07/28/2020   ESRSEDRATE 24 (H) 11/12/2015   ESRSEDRATE 20 12/29/2006   LABURIC 5.5 04/27/2017   REPTSTATUS 07/19/2008 FINAL 07/15/2008   REPTSTATUS 07/19/2008 FINAL  07/15/2008   GRAMSTAIN  07/15/2008    MODERATE WBC PRESENT,BOTH PMN AND MONONUCLEAR NO SQUAMOUS EPITHELIAL CELLS SEEN ABUNDANT GRAM NEGATIVE RODS MODERATE GRAM POSITIVE COCCI IN PAIRS FEW GRAM POSITIVE RODS   GRAMSTAIN  07/15/2008    MODERATE WBC PRESENT,BOTH PMN AND MONONUCLEAR NO SQUAMOUS EPITHELIAL CELLS SEEN ABUNDANT GRAM NEGATIVE RODS MODERATE GRAM POSITIVE COCCI IN PAIRS FEW GRAM POSITIVE RODS   CULT NORMAL VAGINAL FLORA 07/15/2008   CULT NO ANAEROBES ISOLATED 07/15/2008     Lab Results  Component Value Date   ALBUMIN 4.0 01/14/2021   ALBUMIN 4.0 07/28/2020   ALBUMIN 3.7 12/05/2019    Lab Results  Component Value Date   MG 1.9 01/24/2013   No results found for: VD25OH  No results found for: PREALBUMIN CBC EXTENDED Latest Ref Rng & Units 03/25/2021 02/17/2021 01/14/2021  WBC 4.0 - 10.5 K/uL 12.1(H) 13.5(H) 11.7(H)  RBC 3.87 - 5.11 Mil/uL 4.19 4.47 4.44  HGB 12.0 - 15.0 g/dL 12.6 12.8 13.0  HCT 36.0 - 46.0 % 37.5 39.9 40.0  PLT 150.0 - 400.0 K/uL 476.0(H) 481.0(H) 464.0(H)  NEUTROABS 1.4 - 7.7 K/uL 8.4(H) 9.6(H) 7.9(H)  LYMPHSABS 0.7 - 4.0 K/uL 2.7 2.8 2.8     There is no height or weight on file to calculate BMI.  Orders:  No orders of the defined types were placed in this  encounter.  Meds ordered this encounter  Medications   meloxicam (MOBIC) 15 MG tablet    Sig: Take 1 tablet (15 mg total) by mouth daily.    Dispense:  30 tablet    Refill:  0     Procedures: No procedures performed  Clinical Data: No additional findings.  ROS:  All other systems negative, except as noted in the HPI. Review of Systems  Objective: Vital Signs: There were no vitals taken for this visit.  Specialty Comments:  No specialty comments available.  PMFS History: Patient Active Problem List   Diagnosis Date Noted   Muscle spasm of left lower extremity 01/16/2021   Atrophic vaginitis 07/29/2020   Bunion, right 05/19/2020   Hives 02/03/2019   Bilateral foot pain  02/03/2019   Abdominal pain 05/15/2018   Visual changes 11/02/2017   Left hip pain 08/13/2017   Neuropathic pain of ankle, left 04/27/2017   LLQ pain 08/21/2016   Low back pain with right-sided sciatica 08/21/2016   Left leg pain 02/09/2016   Urinary urgency 12/15/2015   Preventative health care 11/22/2015   Hand pain, left 11/10/2015   Benign paroxysmal positional vertigo 04/26/2015   Thoracic outlet syndrome 11/03/2014   Palpitations 04/21/2014   Onychomycosis 04/17/2014   Chest pain, atypical 08/24/2013   Right Achilles tendinitis 10/14/2012   GERD (gastroesophageal reflux disease) 03/26/2012   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   Knee pain, bilateral 01/02/2012   DYSPNEA ON EXERTION 09/21/2010   Hyperlipidemia, mixed 12/23/2008   Diabetes mellitus type 2 in obese ([place]) 06/19/2008   Essential hypertension 05/08/2008   Edema 05/08/2008   Obesity 11/16/2006   DYSFUNCTIONAL UTERINE BLEEDING 11/16/2006   Past Medical History:  Diagnosis Date   Achilles tendonitis 10/14/2012   Asthma    childhood   Atypical chest pain    normal stress echo 02/2009 -LV size was normal LV global systolic function was normal- Normal wall motion; no LV regional wall motion abnormalities   Benign paroxysmal positional vertigo 04/26/2015   Chest pain, atypical 08/24/2013   Diabetes mellitus    GERD (gastroesophageal reflux disease)    Hand pain, left 11/10/2015   Hyperlipemia    Hypertension    Neuropathic pain of ankle, left 04/27/2017   Onychomycosis 04/17/2014   Pain of right heel 10/14/2012   Preventative health care 11/22/2015   Sinusitis, acute 01/17/2016   Thoracic outlet syndrome 11/03/2014   Urinary urgency 12/15/2015    Family History  Problem Relation Age of Onset   GER disease Mother    GER disease Father    Heart disease Father        father has "heart problems"   Anemia Father    Asthma Other    Hypertension Other    Diabetes Other     Past Surgical History:  Procedure Laterality Date    ABDOMINAL HYSTERECTOMY     CYSTOSCOPY  07/23/2012   Procedure: CYSTOSCOPY;  Surgeon: Marvene Staff, MD;  Location: [facility];  Service: Gynecology;  Laterality: N/A;   DILATION AND CURETTAGE OF UTERUS     Lysis of Adhesions   ROBOTIC ASSISTED LAPAROSCOPIC LYSIS OF ADHESION  07/23/2012   Procedure: ROBOTIC ASSISTED LAPAROSCOPIC LYSIS OF ADHESION;  Surgeon: Marvene Staff, MD;  Location: [facility];  Service: Gynecology;  Laterality: N/A;   ROBOTIC ASSISTED SALPINGO OOPHERECTOMY  07/23/2012   Procedure: ROBOTIC ASSISTED SALPINGO OOPHERECTOMY;  Surgeon: Marvene Staff, MD;  Location: [facility];  Service: Gynecology;  Laterality: Bilateral;   Social  History   Occupational History   Not on file  Tobacco Use   Smoking status: Former   Smokeless tobacco: Never  Vaping Use   Vaping Use: Never used  Substance and Sexual Activity   Alcohol use: No   Drug use: No   Sexual activity: Not on file

## 2021-04-13 MED FILL — Continuous Glucose System Sensor: 28 days supply | Qty: 2 | Fill #4 | Status: AC

## 2021-04-14 ENCOUNTER — Other Ambulatory Visit (HOSPITAL_COMMUNITY): Payer: Self-pay

## 2021-04-15 ENCOUNTER — Other Ambulatory Visit (HOSPITAL_COMMUNITY): Payer: Self-pay

## 2021-04-15 MED ORDER — CARESTART COVID-19 HOME TEST VI KIT
PACK | 0 refills | Status: DC
  Filled 2021-04-15: qty 4, 4d supply, fill #0

## 2021-04-22 ENCOUNTER — Other Ambulatory Visit: Payer: Self-pay | Admitting: Family Medicine

## 2021-04-23 ENCOUNTER — Other Ambulatory Visit (HOSPITAL_COMMUNITY): Payer: Self-pay

## 2021-04-23 MED ORDER — METFORMIN HCL ER 500 MG PO TB24
ORAL_TABLET | ORAL | 1 refills | Status: DC
  Filled 2021-04-23: qty 450, 90d supply, fill #0
  Filled 2021-08-31: qty 450, 90d supply, fill #1

## 2021-05-03 DIAGNOSIS — Z1231 Encounter for screening mammogram for malignant neoplasm of breast: Secondary | ICD-10-CM | POA: Diagnosis not present

## 2021-05-03 LAB — HM MAMMOGRAPHY

## 2021-05-04 ENCOUNTER — Encounter: Payer: Self-pay | Admitting: *Deleted

## 2021-05-06 ENCOUNTER — Encounter: Payer: Self-pay | Admitting: Orthopedic Surgery

## 2021-05-06 ENCOUNTER — Other Ambulatory Visit: Payer: Self-pay

## 2021-05-06 ENCOUNTER — Ambulatory Visit: Payer: 59 | Admitting: Physician Assistant

## 2021-05-06 DIAGNOSIS — M25561 Pain in right knee: Secondary | ICD-10-CM | POA: Diagnosis not present

## 2021-05-06 NOTE — Progress Notes (Signed)
Office Visit Note   Patient: Eileen Richards           Date of Birth: 06/28/1961           MRN: [id number] Visit Date: 05/06/2021              Requested by: Mosie Lukes, MD [address] [place],  [place] [address] PCP: Mosie Lukes, MD  Chief Complaint  Patient presents with   Right Knee - Pain      HPI: Patient presents in follow-up today for her right knee pain.  At her last visit she was diagnosed with patellofemoral arthritis.  She did not want to do an injection at that visit.  She has been good about doing quadricep strengthening as was demonstrated.  She is also using Voltaren gel and taking meloxicam but does not feel like it helped much.  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  Plan: Patient will come back tomorrow for a injection she cannot do this today because she has to immediately go work on her feet for 4 hours at [employer].  She should not be charged a Development worker, community as this will be a continuation of her visit from today  Follow-Up Instructions: No follow-ups on file.   Ortho Exam  Patient is alert, oriented, no adenopathy, well-dressed, normal affect, normal respiratory effort. Examination of the right knee no effusion no cellulitis she has crepitus with range of motion around the kneecap.  Minimal medial lateral joint line tenderness she does have an antalgic gait secondary to pain  Imaging: No results found. No images are attached to the encounter.  Labs: Lab Results  Component Value Date   HGBA1C 7.0 (A) 03/24/2021   HGBA1C 9.8 (A) 12/17/2020   HGBA1C 10.6 (H) 07/28/2020   ESRSEDRATE 24 (H) 11/12/2015   ESRSEDRATE 20 12/29/2006   LABURIC 5.5 04/27/2017   REPTSTATUS 07/19/2008 FINAL 07/15/2008   REPTSTATUS 07/19/2008 FINAL 07/15/2008   GRAMSTAIN  07/15/2008    MODERATE WBC PRESENT,BOTH PMN AND MONONUCLEAR NO SQUAMOUS  EPITHELIAL CELLS SEEN ABUNDANT GRAM NEGATIVE RODS MODERATE GRAM POSITIVE COCCI IN PAIRS FEW GRAM POSITIVE RODS   GRAMSTAIN  07/15/2008    MODERATE WBC PRESENT,BOTH PMN AND MONONUCLEAR NO SQUAMOUS EPITHELIAL CELLS SEEN ABUNDANT GRAM NEGATIVE RODS MODERATE GRAM POSITIVE COCCI IN PAIRS FEW GRAM POSITIVE RODS   CULT NORMAL VAGINAL FLORA 07/15/2008   CULT NO ANAEROBES ISOLATED 07/15/2008     Lab Results  Component Value Date   ALBUMIN 4.0 01/14/2021   ALBUMIN 4.0 07/28/2020   ALBUMIN 3.7 12/05/2019    Lab Results  Component Value Date   MG 1.9 01/24/2013   No results found for: VD25OH  No results found for: PREALBUMIN CBC EXTENDED Latest Ref Rng & Units 03/25/2021 02/17/2021 01/14/2021  WBC 4.0 - 10.5 K/uL 12.1(H) 13.5(H) 11.7(H)  RBC 3.87 - 5.11 Mil/uL 4.19 4.47 4.44  HGB 12.0 - 15.0 g/dL 12.6 12.8 13.0  HCT 36.0 - 46.0 % 37.5 39.9 40.0  PLT 150.0 - 400.0 K/uL 476.0(H) 481.0(H) 464.0(H)  NEUTROABS 1.4 - 7.7 K/uL 8.4(H) 9.6(H) 7.9(H)  LYMPHSABS 0.7 - 4.0 K/uL 2.7 2.8 2.8     There is no height or weight on file to calculate BMI.  Orders:  No orders of the defined types were placed in this encounter.  No orders of the defined types were placed in this encounter.  Procedures: No procedures performed  Clinical Data: No additional findings.  ROS:  All other systems negative, except as noted in the HPI. Review of Systems  Objective: Vital Signs: There were no vitals taken for this visit.  Specialty Comments:  No specialty comments available.  PMFS History: Patient Active Problem List   Diagnosis Date Noted   Muscle spasm of left lower extremity 01/16/2021   Atrophic vaginitis 07/29/2020   Bunion, right 05/19/2020   Hives 02/03/2019   Bilateral foot pain 02/03/2019   Abdominal pain 05/15/2018   Visual changes 11/02/2017   Left hip pain 08/13/2017   Neuropathic pain of ankle, left 04/27/2017   LLQ pain 08/21/2016   Low back pain with right-sided sciatica  08/21/2016   Left leg pain 02/09/2016   Urinary urgency 12/15/2015   Preventative health care 11/22/2015   Hand pain, left 11/10/2015   Benign paroxysmal positional vertigo 04/26/2015   Thoracic outlet syndrome 11/03/2014   Palpitations 04/21/2014   Onychomycosis 04/17/2014   Chest pain, atypical 08/24/2013   Right Achilles tendinitis 10/14/2012   GERD (gastroesophageal reflux disease) 03/26/2012   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) 06/19/2008   Essential hypertension 05/08/2008   Edema 05/08/2008   Obesity 11/16/2006   DYSFUNCTIONAL UTERINE BLEEDING 11/16/2006   Past Medical History:  Diagnosis Date   Achilles tendonitis 10/14/2012   Asthma    childhood   Atypical chest pain    normal stress echo 02/2009 -LV size was normal LV global systolic function was normal- Normal wall motion; no LV regional wall motion abnormalities   Benign paroxysmal positional vertigo 04/26/2015   Chest pain, atypical 08/24/2013   Diabetes mellitus    GERD (gastroesophageal reflux disease)    Hand pain, left 11/10/2015   Hyperlipemia    Hypertension    Neuropathic pain of ankle, left 04/27/2017   Onychomycosis 04/17/2014   Pain of right heel 10/14/2012   Preventative health care 11/22/2015   Sinusitis, acute 01/17/2016   Thoracic outlet syndrome 11/03/2014   Urinary urgency 12/15/2015    Family History  Problem Relation Age of Onset   GER disease Mother    GER disease Father    Heart disease Father        father has "heart problems"   Anemia Father    Asthma Other    Hypertension Other    Diabetes Other     Past Surgical History:  Procedure Laterality Date   ABDOMINAL HYSTERECTOMY     CYSTOSCOPY  07/23/2012   Procedure: CYSTOSCOPY;  Surgeon: Marvene Staff, MD;  Location: [facility];  Service: Gynecology;  Laterality: N/A;   DILATION AND CURETTAGE OF UTERUS      Lysis of Adhesions   ROBOTIC ASSISTED LAPAROSCOPIC LYSIS OF ADHESION  07/23/2012   Procedure: ROBOTIC ASSISTED LAPAROSCOPIC LYSIS OF ADHESION;  Surgeon: Marvene Staff, MD;  Location: [facility];  Service: Gynecology;  Laterality: N/A;   ROBOTIC ASSISTED SALPINGO OOPHERECTOMY  07/23/2012   Procedure: ROBOTIC ASSISTED SALPINGO OOPHERECTOMY;  Surgeon: Marvene Staff, MD;  Location: [facility];  Service: Gynecology;  Laterality: Bilateral;   Social History   Occupational History   Not on file  Tobacco Use   Smoking status: Former   Smokeless tobacco: Never  Vaping Use   Vaping Use: Never used  Substance and Sexual Activity   Alcohol use: No   Drug  use: No   Sexual activity: Not on file

## 2021-05-07 ENCOUNTER — Encounter: Payer: Self-pay | Admitting: Physician Assistant

## 2021-05-07 ENCOUNTER — Ambulatory Visit (INDEPENDENT_AMBULATORY_CARE_PROVIDER_SITE_OTHER): Payer: 59 | Admitting: Physician Assistant

## 2021-05-07 DIAGNOSIS — M25561 Pain in right knee: Secondary | ICD-10-CM

## 2021-05-07 NOTE — Progress Notes (Signed)
Office Visit Note   Patient: Eileen Richards           Date of Birth: 1960/12/26           MRN: [id number] Visit Date: 05/07/2021              Requested by: Mosie Lukes, MD [address] [place],  [place] [address] PCP: Mosie Lukes, MD  No chief complaint on file.     HPI: And is a pleasant 60 year old woman with a history of right patellofemoral pain and knee pain.  She was seen in the office yesterday and we discussed going forward with an injection.  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  She is not working this evening so she came back for her injection today.  Nothing in her knee is changed  Assessment & Plan: Visit Diagnoses: No diagnosis found.  Plan: Follow-up in 3 weeks for reevaluation  Follow-Up Instructions: No follow-ups on file.   Ortho Exam  Patient is alert, oriented, no adenopathy, well-dressed, normal affect, normal respiratory effort. Examination of her right knee no effusion no redness she does have tenderness around thepatella femoral joint  Imaging: No results found. No images are attached to the encounter.  Labs: Lab Results  Component Value Date   HGBA1C 7.0 (A) 03/24/2021   HGBA1C 9.8 (A) 12/17/2020   HGBA1C 10.6 (H) 07/28/2020   ESRSEDRATE 24 (H) 11/12/2015   ESRSEDRATE 20 12/29/2006   LABURIC 5.5 04/27/2017   REPTSTATUS 07/19/2008 FINAL 07/15/2008   REPTSTATUS 07/19/2008 FINAL 07/15/2008   GRAMSTAIN  07/15/2008    MODERATE WBC PRESENT,BOTH PMN AND MONONUCLEAR NO SQUAMOUS EPITHELIAL CELLS SEEN ABUNDANT GRAM NEGATIVE RODS MODERATE GRAM POSITIVE COCCI IN PAIRS FEW GRAM POSITIVE RODS   GRAMSTAIN  07/15/2008    MODERATE WBC PRESENT,BOTH PMN AND MONONUCLEAR NO SQUAMOUS EPITHELIAL CELLS SEEN ABUNDANT GRAM NEGATIVE RODS MODERATE GRAM POSITIVE COCCI IN PAIRS FEW GRAM POSITIVE RODS   CULT NORMAL VAGINAL FLORA 07/15/2008   CULT NO ANAEROBES ISOLATED 07/15/2008      Lab Results  Component Value Date   ALBUMIN 4.0 01/14/2021   ALBUMIN 4.0 07/28/2020   ALBUMIN 3.7 12/05/2019    Lab Results  Component Value Date   MG 1.9 01/24/2013   No results found for: VD25OH  No results found for: PREALBUMIN CBC EXTENDED Latest Ref Rng & Units 03/25/2021 02/17/2021 01/14/2021  WBC 4.0 - 10.5 K/uL 12.1(H) 13.5(H) 11.7(H)  RBC 3.87 - 5.11 Mil/uL 4.19 4.47 4.44  HGB 12.0 - 15.0 g/dL 12.6 12.8 13.0  HCT 36.0 - 46.0 % 37.5 39.9 40.0  PLT 150.0 - 400.0 K/uL 476.0(H) 481.0(H) 464.0(H)  NEUTROABS 1.4 - 7.7 K/uL 8.4(H) 9.6(H) 7.9(H)  LYMPHSABS 0.7 - 4.0 K/uL 2.7 2.8 2.8     There is no height or weight on file to calculate BMI.  Orders:  No orders of the defined types were placed in this encounter.  No orders of the defined types were placed in this encounter.    Procedures: Large Joint Inj: R knee on 05/07/2021 4:15 PM Indications: pain and diagnostic evaluation Details: 22 G 1.5 in needle, anteromedial approach  Arthrogram: No  Medications: 40 mg methylPREDNISolone acetate 40 MG/ML; 5 mL lidocaine 1 % Outcome: tolerated well, no immediate complications Procedure, treatment alternatives, risks and benefits explained, specific risks discussed. Consent was given by the patient.     Clinical Data: No additional findings.  ROS:  All other systems negative, except as noted in the HPI. Review of Systems  Objective: Vital Signs: There were no vitals taken for this visit.  Specialty Comments:  No specialty comments available.  PMFS History: Patient Active Problem List   Diagnosis Date Noted   Muscle spasm of left lower extremity 01/16/2021   Atrophic vaginitis 07/29/2020   Bunion, right 05/19/2020   Hives 02/03/2019   Bilateral foot pain 02/03/2019   Abdominal pain 05/15/2018   Visual changes 11/02/2017   Left hip pain 08/13/2017   Neuropathic pain of ankle, left 04/27/2017   LLQ pain 08/21/2016   Low back pain with right-sided  sciatica 08/21/2016   Left leg pain 02/09/2016   Urinary urgency 12/15/2015   Preventative health care 11/22/2015   Hand pain, left 11/10/2015   Benign paroxysmal positional vertigo 04/26/2015   Thoracic outlet syndrome 11/03/2014   Palpitations 04/21/2014   Onychomycosis 04/17/2014   Chest pain, atypical 08/24/2013   Right Achilles tendinitis 10/14/2012   GERD (gastroesophageal reflux disease) 03/26/2012   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   Knee pain, bilateral 01/02/2012   DYSPNEA ON EXERTION 09/21/2010   Hyperlipidemia, mixed 12/23/2008   Diabetes mellitus type 2 in obese ([place]) 06/19/2008   Essential hypertension 05/08/2008   Edema 05/08/2008   Obesity 11/16/2006   DYSFUNCTIONAL UTERINE BLEEDING 11/16/2006   Past Medical History:  Diagnosis Date   Achilles tendonitis 10/14/2012   Asthma    childhood   Atypical chest pain    normal stress echo 02/2009 -LV size was normal LV global systolic function was normal- Normal wall motion; no LV regional wall motion abnormalities   Benign paroxysmal positional vertigo 04/26/2015   Chest pain, atypical 08/24/2013   Diabetes mellitus    GERD (gastroesophageal reflux disease)    Hand pain, left 11/10/2015   Hyperlipemia    Hypertension    Neuropathic pain of ankle, left 04/27/2017   Onychomycosis 04/17/2014   Pain of right heel 10/14/2012   Preventative health care 11/22/2015   Sinusitis, acute 01/17/2016   Thoracic outlet syndrome 11/03/2014   Urinary urgency 12/15/2015    Family History  Problem Relation Age of Onset   GER disease Mother    GER disease Father    Heart disease Father        father has "heart problems"   Anemia Father    Asthma Other    Hypertension Other    Diabetes Other     Past Surgical History:  Procedure Laterality Date   ABDOMINAL HYSTERECTOMY     CYSTOSCOPY  07/23/2012   Procedure: CYSTOSCOPY;  Surgeon: Marvene Staff, MD;  Location: [facility];  Service: Gynecology;  Laterality: N/A;   DILATION AND CURETTAGE OF  UTERUS     Lysis of Adhesions   ROBOTIC ASSISTED LAPAROSCOPIC LYSIS OF ADHESION  07/23/2012   Procedure: ROBOTIC ASSISTED LAPAROSCOPIC LYSIS OF ADHESION;  Surgeon: Marvene Staff, MD;  Location: [facility];  Service: Gynecology;  Laterality: N/A;   ROBOTIC ASSISTED SALPINGO OOPHERECTOMY  07/23/2012   Procedure: ROBOTIC ASSISTED SALPINGO OOPHERECTOMY;  Surgeon: Marvene Staff, MD;  Location: [facility];  Service: Gynecology;  Laterality: Bilateral;   Social History   Occupational History   Not on file  Tobacco Use   Smoking status: Former   Smokeless tobacco: Never  Vaping Use   Vaping Use: Never used  Substance and Sexual Activity   Alcohol use: No   Drug use: No   Sexual activity: Not on file

## 2021-05-10 MED ORDER — LIDOCAINE HCL 1 % IJ SOLN
5.0000 mL | INTRAMUSCULAR | Status: AC | PRN
  Administered 2021-05-07: 5 mL

## 2021-05-10 MED ORDER — METHYLPREDNISOLONE ACETATE 40 MG/ML IJ SUSP
40.0000 mg | INTRAMUSCULAR | Status: AC | PRN
  Administered 2021-05-07: 40 mg via INTRA_ARTICULAR

## 2021-05-19 ENCOUNTER — Other Ambulatory Visit (HOSPITAL_COMMUNITY): Payer: Self-pay

## 2021-05-19 ENCOUNTER — Other Ambulatory Visit: Payer: Self-pay | Admitting: Family Medicine

## 2021-05-23 MED FILL — Continuous Glucose System Sensor: 28 days supply | Qty: 2 | Fill #5 | Status: AC

## 2021-05-25 ENCOUNTER — Other Ambulatory Visit (HOSPITAL_COMMUNITY): Payer: Self-pay

## 2021-05-26 ENCOUNTER — Other Ambulatory Visit (HOSPITAL_COMMUNITY): Payer: Self-pay

## 2021-05-26 MED FILL — Albuterol Sulfate Inhal Aero 108 MCG/ACT (90MCG Base Equiv): RESPIRATORY_TRACT | 25 days supply | Qty: 18 | Fill #0 | Status: AC

## 2021-05-28 ENCOUNTER — Ambulatory Visit: Payer: 59 | Admitting: Physician Assistant

## 2021-05-28 ENCOUNTER — Encounter: Payer: Self-pay | Admitting: Physician Assistant

## 2021-05-28 ENCOUNTER — Other Ambulatory Visit: Payer: Self-pay

## 2021-05-28 DIAGNOSIS — M25561 Pain in right knee: Secondary | ICD-10-CM | POA: Diagnosis not present

## 2021-05-28 NOTE — Progress Notes (Signed)
Office Visit Note   Patient: Eileen Richards           Date of Birth: [birth year]           MRN: [id number] Visit Date: 05/28/2021              Requested by: Mosie Lukes, MD [address] [place],  [place] [id number] PCP: Mosie Lukes, MD  Chief Complaint  Patient presents with   Right Knee - Follow-up    05/07/21 s/p injection       HPI: Patient is a pleasant 60 year old woman who follows up today for her patellofemoral symptoms.  At her last visit she was given an injection.  She said this did help but she still has a little pain.  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  She also is using Mobic.  She has been trying to do close chain strengthening as we discussed  Assessment & Plan: Visit Diagnoses: No diagnosis found.  Plan: Patient will follow-up in 3 weeks she has the next week off from work and she is going to focus on taking care of her knee.  Of also told her to start using Voltaren gel topically she is going to do this.  Follow-up in 3 weeks  Follow-Up Instructions: No follow-ups on file.   Ortho Exam  Patient is alert, oriented, no adenopathy, well-dressed, normal affect, normal respiratory effort. Examination no effusion no swelling she still has some tenderness around the kneecap with some limited patellar mobility.  She does have grinding with range of motion  Imaging: No results found. No images are attached to the encounter.  Labs: Lab Results  Component Value Date   HGBA1C 7.0 (A) 03/24/2021   HGBA1C 9.8 (A) 12/17/2020   HGBA1C 10.6 (H) 07/28/2020   ESRSEDRATE 24 (H) 11/12/2015   ESRSEDRATE 20 12/29/2006   LABURIC 5.5 04/27/2017   REPTSTATUS 07/19/2008 FINAL 07/15/2008   REPTSTATUS 07/19/2008 FINAL 07/15/2008   GRAMSTAIN  07/15/2008    MODERATE WBC PRESENT,BOTH PMN AND MONONUCLEAR NO SQUAMOUS EPITHELIAL CELLS SEEN ABUNDANT GRAM NEGATIVE RODS MODERATE GRAM POSITIVE  COCCI IN PAIRS FEW GRAM POSITIVE RODS   GRAMSTAIN  07/15/2008    MODERATE WBC PRESENT,BOTH PMN AND MONONUCLEAR NO SQUAMOUS EPITHELIAL CELLS SEEN ABUNDANT GRAM NEGATIVE RODS MODERATE GRAM POSITIVE COCCI IN PAIRS FEW GRAM POSITIVE RODS   CULT NORMAL VAGINAL FLORA 07/15/2008   CULT NO ANAEROBES ISOLATED 07/15/2008     Lab Results  Component Value Date   ALBUMIN 4.0 01/14/2021   ALBUMIN 4.0 07/28/2020   ALBUMIN 3.7 12/05/2019    Lab Results  Component Value Date   MG 1.9 01/24/2013   No results found for: VD25OH  No results found for: PREALBUMIN CBC EXTENDED Latest Ref Rng & Units 03/25/2021 02/17/2021 01/14/2021  WBC 4.0 - 10.5 K/uL 12.1(H) 13.5(H) 11.7(H)  RBC 3.87 - 5.11 Mil/uL 4.19 4.47 4.44  HGB 12.0 - 15.0 g/dL 12.6 12.8 13.0  HCT 36.0 - 46.0 % 37.5 39.9 40.0  PLT 150.0 - 400.0 K/uL 476.0(H) 481.0(H) 464.0(H)  NEUTROABS 1.4 - 7.7 K/uL 8.4(H) 9.6(H) 7.9(H)  LYMPHSABS 0.7 - 4.0 K/uL 2.7 2.8 2.8     There is no height or weight on file to calculate BMI.  Orders:  No orders of the defined types were placed in this encounter.  No orders of the defined types were placed in this encounter.  Procedures: No procedures performed  Clinical Data: No additional findings.  ROS:  All other systems negative, except as noted in the HPI. Review of Systems  Objective: Vital Signs: There were no vitals taken for this visit.  Specialty Comments:  No specialty comments available.  PMFS History: Patient Active Problem List   Diagnosis Date Noted   Muscle spasm of left lower extremity 01/16/2021   Atrophic vaginitis 07/29/2020   Bunion, right 05/19/2020   Hives 02/03/2019   Bilateral foot pain 02/03/2019   Abdominal pain 05/15/2018   Visual changes 11/02/2017   Left hip pain 08/13/2017   Neuropathic pain of ankle, left 04/27/2017   LLQ pain 08/21/2016   Low back pain with right-sided sciatica 08/21/2016   Left leg pain 02/09/2016   Urinary urgency 12/15/2015    Preventative health care 11/22/2015   Hand pain, left 11/10/2015   Benign paroxysmal positional vertigo 04/26/2015   Thoracic outlet syndrome 11/03/2014   Palpitations 04/21/2014   Onychomycosis 04/17/2014   Chest pain, atypical 08/24/2013   Right Achilles tendinitis 10/14/2012   GERD (gastroesophageal reflux disease) 03/26/2012   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   Knee pain, bilateral 01/02/2012   DYSPNEA ON EXERTION 09/21/2010   Hyperlipidemia, mixed 12/23/2008   Diabetes mellitus type 2 in obese ([facility]) 06/19/2008   Essential hypertension 05/08/2008   Edema 05/08/2008   Obesity 11/16/2006   DYSFUNCTIONAL UTERINE BLEEDING 11/16/2006   Past Medical History:  Diagnosis Date   Achilles tendonitis 10/14/2012   Asthma    childhood   Atypical chest pain    normal stress echo 02/2009 -LV size was normal LV global systolic function was normal- Normal wall motion; no LV regional wall motion abnormalities   Benign paroxysmal positional vertigo 04/26/2015   Chest pain, atypical 08/24/2013   Diabetes mellitus    GERD (gastroesophageal reflux disease)    Hand pain, left 11/10/2015   Hyperlipemia    Hypertension    Neuropathic pain of ankle, left 04/27/2017   Onychomycosis 04/17/2014   Pain of right heel 10/14/2012   Preventative health care 11/22/2015   Sinusitis, acute 01/17/2016   Thoracic outlet syndrome 11/03/2014   Urinary urgency 12/15/2015    Family History  Problem Relation Age of Onset   GER disease Mother    GER disease Father    Heart disease Father        father has "heart problems"   Anemia Father    Asthma Other    Hypertension Other    Diabetes Other     Past Surgical History:  Procedure Laterality Date   ABDOMINAL HYSTERECTOMY     CYSTOSCOPY  07/23/2012   Procedure: CYSTOSCOPY;  Surgeon: Marvene Staff, MD;  Location: [facility];  Service: Gynecology;  Laterality: N/A;   DILATION AND CURETTAGE OF UTERUS     Lysis of Adhesions   ROBOTIC ASSISTED LAPAROSCOPIC LYSIS OF ADHESION   07/23/2012   Procedure: ROBOTIC ASSISTED LAPAROSCOPIC LYSIS OF ADHESION;  Surgeon: Marvene Staff, MD;  Location: [facility];  Service: Gynecology;  Laterality: N/A;   ROBOTIC ASSISTED SALPINGO OOPHERECTOMY  07/23/2012   Procedure: ROBOTIC ASSISTED SALPINGO OOPHERECTOMY;  Surgeon: Marvene Staff, MD;  Location: [facility];  Service: Gynecology;  Laterality: Bilateral;   Social History   Occupational History   Not on file  Tobacco Use   Smoking status: Former   Smokeless tobacco: Never  Vaping Use   Vaping Use: Never used  Substance and Sexual Activity   Alcohol use: No   Drug  use: No   Sexual activity: Not on file

## 2021-06-01 ENCOUNTER — Other Ambulatory Visit (INDEPENDENT_AMBULATORY_CARE_PROVIDER_SITE_OTHER): Payer: 59

## 2021-06-01 ENCOUNTER — Other Ambulatory Visit: Payer: Self-pay

## 2021-06-01 DIAGNOSIS — D72829 Elevated white blood cell count, unspecified: Secondary | ICD-10-CM | POA: Diagnosis not present

## 2021-06-01 LAB — CBC WITH DIFFERENTIAL/PLATELET
Basophils Absolute: 0.1 10*3/uL (ref 0.0–0.1)
Basophils Relative: 0.6 % (ref 0.0–3.0)
Eosinophils Absolute: 0.1 10*3/uL (ref 0.0–0.7)
Eosinophils Relative: 1.3 % (ref 0.0–5.0)
HCT: 40.5 % (ref 36.0–46.0)
Hemoglobin: 13.2 g/dL (ref 12.0–15.0)
Lymphocytes Relative: 21.6 % (ref 12.0–46.0)
Lymphs Abs: 2 10*3/uL (ref 0.7–4.0)
MCHC: 32.6 g/dL (ref 30.0–36.0)
MCV: 89.4 fl (ref 78.0–100.0)
Monocytes Absolute: 0.4 10*3/uL (ref 0.1–1.0)
Monocytes Relative: 4 % (ref 3.0–12.0)
Neutro Abs: 6.8 10*3/uL (ref 1.4–7.7)
Neutrophils Relative %: 72.5 % (ref 43.0–77.0)
Platelets: 458 10*3/uL — ABNORMAL HIGH (ref 150.0–400.0)
RBC: 4.54 Mil/uL (ref 3.87–5.11)
RDW: 14.9 % (ref 11.5–15.5)
WBC: 9.3 10*3/uL (ref 4.0–10.5)

## 2021-06-02 ENCOUNTER — Other Ambulatory Visit: Payer: Self-pay | Admitting: Family Medicine

## 2021-06-02 MED FILL — Famotidine Tab 20 MG: ORAL | 90 days supply | Qty: 180 | Fill #1 | Status: AC

## 2021-06-03 ENCOUNTER — Other Ambulatory Visit (HOSPITAL_COMMUNITY): Payer: Self-pay

## 2021-06-03 MED ORDER — HYDROCHLOROTHIAZIDE 25 MG PO TABS
25.0000 mg | ORAL_TABLET | Freq: Every day | ORAL | 1 refills | Status: DC
  Filled 2021-06-03: qty 90, 90d supply, fill #0
  Filled 2021-09-15: qty 90, 90d supply, fill #1

## 2021-06-03 MED ORDER — ROSUVASTATIN CALCIUM 5 MG PO TABS
5.0000 mg | ORAL_TABLET | Freq: Every day | ORAL | 3 refills | Status: DC
  Filled 2021-06-03: qty 30, 30d supply, fill #0
  Filled 2021-07-25: qty 30, 30d supply, fill #1
  Filled 2021-08-23: qty 30, 30d supply, fill #2
  Filled 2021-09-22: qty 30, 30d supply, fill #3

## 2021-06-03 MED ORDER — CARVEDILOL 3.125 MG PO TABS
3.1250 mg | ORAL_TABLET | Freq: Two times a day (BID) | ORAL | 1 refills | Status: DC
  Filled 2021-06-03: qty 180, 90d supply, fill #0
  Filled 2021-11-01: qty 180, 90d supply, fill #1

## 2021-06-04 ENCOUNTER — Other Ambulatory Visit (HOSPITAL_COMMUNITY): Payer: Self-pay

## 2021-06-09 ENCOUNTER — Ambulatory Visit: Payer: 59 | Admitting: Endocrinology

## 2021-06-09 ENCOUNTER — Encounter: Payer: Self-pay | Admitting: Endocrinology

## 2021-06-09 ENCOUNTER — Other Ambulatory Visit: Payer: Self-pay

## 2021-06-09 ENCOUNTER — Other Ambulatory Visit (HOSPITAL_COMMUNITY): Payer: Self-pay

## 2021-06-09 VITALS — BP 136/80 | HR 86 | Ht 60.0 in | Wt 243.4 lb

## 2021-06-09 DIAGNOSIS — E1169 Type 2 diabetes mellitus with other specified complication: Secondary | ICD-10-CM

## 2021-06-09 DIAGNOSIS — E669 Obesity, unspecified: Secondary | ICD-10-CM | POA: Diagnosis not present

## 2021-06-09 LAB — POCT GLYCOSYLATED HEMOGLOBIN (HGB A1C): Hemoglobin A1C: 6.8 % — AB (ref 4.0–5.6)

## 2021-06-09 MED ORDER — GLIMEPIRIDE 1 MG PO TABS
0.5000 mg | ORAL_TABLET | Freq: Every day | ORAL | 3 refills | Status: DC
  Filled 2021-06-09: qty 45, 90d supply, fill #0
  Filled 2021-06-17: qty 30, 60d supply, fill #0

## 2021-06-09 NOTE — Progress Notes (Signed)
Subjective:    Patient ID: Eileen Richards, female    DOB: 09/14/1961, 60 y.o.   MRN: [id number]  HPI Pt returns for f/u of diabetes mellitus: DM type: Insulin-requiring type 2 Dx'ed: 7017 Complications: none Therapy: insulin since 2018, Ozempic, and 3 oral meds.   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   Other: she takes QD insulin, at least for now Interval history: I reviewed continuous glucose monitor data.  glucose varies from 57-200.  It is in general lowest in at [place], and highest at 10PM Past Medical History:  Diagnosis Date   Achilles tendonitis 10/14/2012   Asthma    childhood   Atypical chest pain    normal stress echo 02/2009 -LV size was normal LV global systolic function was normal- Normal wall motion; no LV regional wall motion abnormalities   Benign paroxysmal positional vertigo 04/26/2015   Chest pain, atypical 08/24/2013   Diabetes mellitus    GERD (gastroesophageal reflux disease)    Hand pain, left 11/10/2015   Hyperlipemia    Hypertension    Neuropathic pain of ankle, left 04/27/2017   Onychomycosis 04/17/2014   Pain of right heel 10/14/2012   Preventative health care 11/22/2015   Sinusitis, acute 01/17/2016   Thoracic outlet syndrome 11/03/2014   Urinary urgency 12/15/2015    Past Surgical History:  Procedure Laterality Date   ABDOMINAL HYSTERECTOMY     CYSTOSCOPY  07/23/2012   Procedure: CYSTOSCOPY;  Surgeon: Marvene Staff, MD;  Location: [facility];  Service: Gynecology;  Laterality: N/A;   DILATION AND CURETTAGE OF UTERUS     Lysis of Adhesions   ROBOTIC ASSISTED LAPAROSCOPIC LYSIS OF ADHESION  07/23/2012   Procedure: ROBOTIC ASSISTED LAPAROSCOPIC LYSIS OF ADHESION;  Surgeon: Marvene Staff, MD;  Location: [facility];  Service: Gynecology;  Laterality: N/A;   ROBOTIC ASSISTED SALPINGO OOPHERECTOMY  07/23/2012    Procedure: ROBOTIC ASSISTED SALPINGO OOPHERECTOMY;  Surgeon: Marvene Staff, MD;  Location: [facility];  Service: Gynecology;  Laterality: Bilateral;    Social History   Socioeconomic History   Marital status: Single    Spouse name: Not on file   Number of children: Not on file   Years of education: Not on file   Highest education level: Not on file  Occupational History   Not on file  Tobacco Use   Smoking status: Former   Smokeless tobacco: Never  Vaping Use   Vaping Use: Never used  Substance and Sexual Activity   Alcohol use: No   Drug use: No   Sexual activity: Not on file  Other Topics Concern   Not on file  Social History Narrative   Not on file   Social Determinants of Health   Financial Resource Strain: Not on file  Food Insecurity: Not on file  Transportation Needs: Not on file  Physical Activity: Not on file  Stress: Not on file  Social Connections: Not on file  Intimate Partner Violence: Not on file    Current Outpatient Medications on File Prior to Visit  Medication Sig Dispense Refill   Accu-Chek FastClix Lancets MISC USE AS DIRECTED TWICE DAILY TO CHECK BLOOD SUGAR. DX E11.9 102 each 6   albuterol (VENTOLIN HFA) 108 (90 Base) MCG/ACT inhaler INHALE 2 PUFFS INTO THE LUNGS EVERY 6 (SIX) HOURS AS NEEDED FOR WHEEZING OR SHORTNESS OF BREATH. 18 g 5   aspirin  81 MG tablet Take 81 mg by mouth daily.     blood glucose meter kit and supplies KIT Dispense based on patient and insurance preference. Use up to four times daily as directed. (FOR ICD-9 250.00, 250.01). 1 each 0   Blood Glucose Monitoring Suppl (FREESTYLE FREEDOM LITE) w/Device KIT Use to check sugar twice a day.  Dx Code: E11.9 1 kit 0   carvedilol (COREG) 3.125 MG tablet Take 1 tablet (3.125 mg total) by mouth 2 (two) times daily with a meal 180 tablet 1   cetirizine (ZYRTEC) 10 MG tablet TAKE 1 TABLET BY MOUTH 2 TIMES DAILY. 200 tablet 1   clobetasol ointment (TEMOVATE) 0.05 % APPLY A SMALL AMOUNT  TO VULVAR AREA TWO TIMES DAILY FOR 2 WEEKS, THEN EVERY OTHER DAY FOR 2 WEEKS, THEN MONDAY, WEDNESDAY, FRIDAY FOR 2 WEEKS 90 g 0   clobetasol ointment (TEMOVATE) 8.82 % APPLY 1 APPLICATION TOPICALLY 2 (TWO) TIMES DAILY. 60 g 2   Continuous Blood Gluc Receiver (FREESTYLE LIBRE 2 READER) DEVI USE AS DIRECTED. 1 each 1   Continuous Blood Gluc Sensor (DEXCOM G6 SENSOR) MISC Change every 10 days as directed 9 each PRN   Continuous Blood Gluc Sensor (FREESTYLE LIBRE 2 SENSOR) MISC USE AS DIRECTED EVERY 14 DAYS. 6 each 3   COVID-19 At Home Antigen Test (CARESTART COVID-19 HOME TEST) KIT Use as directed 4 each 0   COVID-19 mRNA Vac-TriS, Pfizer, (PFIZER-BIONT COVID-19 VAC-TRIS) SUSP injection Inject into the muscle. 0.3 mL 0   diclofenac Sodium (VOLTAREN) 1 % GEL Apply 2 g topically 4 (four) times daily. Rub into affected area of foot 2 to 4 times daily 100 g 2   famotidine (PEPCID) 20 MG tablet TAKE 1 TABLET (20 MG TOTAL) BY MOUTH 2 (TWO) TIMES DAILY. 180 tablet 3   gabapentin (NEURONTIN) 300 MG capsule Take 1 capsule (300 mg total) by mouth 3 (three) times daily. 90 capsule 1   glucose blood (FREESTYLE LITE) test strip Use to check sugar twice a day.  Dx Code: E11.9 100 each 1   glucose blood test strip USE 1 STRIP TO CHECK BLOOD GLUCOSE UP TO FOUR TIMES DAILY AS DIRECTED. (Patient taking differently: USE 1 STRIP TO CHECK BLOOD GLUCOSE UP TO FOUR TIMES DAILY AS DIRECTED.) 100 strip 0   hydrochlorothiazide (HYDRODIURIL) 25 MG tablet Take 1 tablet (25 mg total) by mouth daily. 90 tablet 1   insulin glargine-yfgn (SEMGLEE, YFGN,) 100 UNIT/ML SOPN Inject 55 Units into the skin every morning. 60 mL 3   Insulin Pen Needle 31G X 5 MM MISC USE W/ VICTOZA AND BASAGLAR 100 each 3   Lancets (FREESTYLE) lancets Use to check sugar twice a day.  Dx Code: E11.9 100 each 1   meclizine (ANTIVERT) 25 MG tablet TAKE 1 TABLET (25 MG TOTAL) BY MOUTH 3 (THREE) TIMES DAILY AS NEEDED FOR DIZZINESS. 30 tablet 0   meloxicam (MOBIC)  15 MG tablet Take 1 tablet (15 mg total) by mouth daily. 30 tablet 0   metFORMIN (GLUCOPHAGE-XR) 500 MG 24 hr tablet Take 2 tablets by mouth 2 times a day and 1 tablet at noon 450 tablet 1   nitroGLYCERIN (NITROSTAT) 0.4 MG SL tablet Place 1 tablet (0.4 mg total) under the tongue every 5 (five) minutes x 3 doses as needed for chest pain. 40 tablet 1   pantoprazole (PROTONIX) 40 MG tablet TAKE 1 TABLET (40 MG TOTAL) BY MOUTH DAILY. 90 tablet 1   potassium chloride SA (KLOR-CON) 20  MEQ tablet TAKE 1 TABLET (20 MEQ TOTAL) BY MOUTH DAILY. 90 tablet 1   promethazine (PHENERGAN) 25 MG tablet Take 1 tablet (25 mg total) by mouth every 8 (eight) hours as needed for nausea or vomiting. 20 tablet 0   rosuvastatin (CRESTOR) 5 MG tablet Take 1 tablet (5 mg total) by mouth daily. 30 tablet 3   Semaglutide, 2 MG/DOSE, (OZEMPIC, 2 MG/DOSE,) 8 MG/3ML SOPN Inject 2 mg into the skin once a week. 9 mL 3   tiZANidine (ZANAFLEX) 4 MG tablet Take 1 tablet (4 mg total) by mouth every 6 (six) hours as needed for muscle spasms. 40 tablet 1   traMADol (ULTRAM) 50 MG tablet Take 1 tablet (50 mg total) by mouth every 12 (twelve) hours as needed. 60 tablet 0   [DISCONTINUED] insulin glargine (SEMGLEE) 100 UNIT/ML Solostar Pen Inject 55 Units into the skin every morning. And pen needles 2/day 60 mL 3   No current facility-administered medications on file prior to visit.    Allergies  Allergen Reactions   Farxiga [Dapagliflozin] Hives    Family History  Problem Relation Age of Onset   GER disease Mother    GER disease Father    Heart disease Father        father has "heart problems"   Anemia Father    Asthma Other    Hypertension Other    Diabetes Other     BP 136/80 (BP Location: Right Arm, Patient Position: Sitting, Cuff Size: Normal)   Pulse 86   Ht 5' (1.524 m)   Wt 243 lb 6.4 oz (110.4 kg)   SpO2 96%   BMI 47.54 kg/m    Review of Systems     Objective:   Physical Exam Pulses: dorsalis pedis  intact bilat.   MSK: no deformity of the feet CV: 1+ bilat leg edema Skin:  no ulcer on the feet.  normal color and temp on the feet. Neuro: sensation is intact to touch on the feet Ext: there is bilateral onychomycosis of the toenails   Lab Results  Component Value Date   HGBA1C 6.8 (A) 06/09/2021      Assessment & Plan:  Insulin-requiring type 2 DM: overcontrolled, for this SU-containing regimen.    Patient Instructions  check your blood sugar twice a day.  vary the time of day when you check, between before the 3 meals, and at bedtime.  also check if you have symptoms of your blood sugar being too high or too low.  please keep a record of the readings and bring it to your next appointment here (or you can bring the meter itself).  You can write it on any piece of paper.  please call us sooner if your blood sugar goes below 70, or if most of your readings are over 200. We will need to take this complex situation in stages.    Please reduce the glimepiride to 1/2 of 1 mg each morning, and none in the evening, and:  continue the same other medications.  our plan will be to stop the glimepiride when we can.   Please come back for a follow-up appointment in 2 months.

## 2021-06-09 NOTE — Patient Instructions (Addendum)
check your blood sugar twice a day.  vary the time of day when you check, between before the 3 meals, and at bedtime.  also check if you have symptoms of your blood sugar being too high or too low.  please keep a record of the readings and bring it to your next appointment here (or you can bring the meter itself).  You can write it on any piece of paper.  please call us sooner if your blood sugar goes below 70, or if most of your readings are over 200. We will need to take this complex situation in stages.    Please reduce the glimepiride to 1/2 of 1 mg each morning, and none in the evening, and:  continue the same other medications.  our plan will be to stop the glimepiride when we can.   Please come back for a follow-up appointment in 2 months.

## 2021-06-16 ENCOUNTER — Other Ambulatory Visit (HOSPITAL_COMMUNITY): Payer: Self-pay

## 2021-06-17 ENCOUNTER — Other Ambulatory Visit (HOSPITAL_COMMUNITY): Payer: Self-pay

## 2021-06-18 ENCOUNTER — Ambulatory Visit: Payer: 59 | Admitting: Physician Assistant

## 2021-06-21 MED FILL — Continuous Glucose System Sensor: 28 days supply | Qty: 2 | Fill #6 | Status: AC

## 2021-06-22 ENCOUNTER — Other Ambulatory Visit (HOSPITAL_COMMUNITY): Payer: Self-pay

## 2021-06-23 ENCOUNTER — Other Ambulatory Visit (HOSPITAL_COMMUNITY): Payer: Self-pay

## 2021-06-27 ENCOUNTER — Other Ambulatory Visit: Payer: Self-pay | Admitting: Family Medicine

## 2021-06-28 ENCOUNTER — Other Ambulatory Visit (HOSPITAL_COMMUNITY): Payer: Self-pay

## 2021-06-28 MED ORDER — POTASSIUM CHLORIDE CRYS ER 20 MEQ PO TBCR
20.0000 meq | EXTENDED_RELEASE_TABLET | Freq: Every day | ORAL | 1 refills | Status: DC
  Filled 2021-06-28 – 2021-07-07 (×2): qty 90, 90d supply, fill #0
  Filled 2021-11-01: qty 90, 90d supply, fill #1

## 2021-07-06 ENCOUNTER — Other Ambulatory Visit (HOSPITAL_COMMUNITY): Payer: Self-pay

## 2021-07-07 ENCOUNTER — Other Ambulatory Visit (HOSPITAL_COMMUNITY): Payer: Self-pay

## 2021-07-09 ENCOUNTER — Encounter: Payer: Self-pay | Admitting: Family Medicine

## 2021-07-15 MED FILL — Continuous Glucose System Sensor: 28 days supply | Qty: 2 | Fill #7 | Status: AC

## 2021-07-16 ENCOUNTER — Other Ambulatory Visit (HOSPITAL_COMMUNITY): Payer: Self-pay

## 2021-07-20 ENCOUNTER — Telehealth: Payer: Self-pay | Admitting: Family Medicine

## 2021-07-20 NOTE — Telephone Encounter (Signed)
[employer] only sent over 2 pages, no form included.  Patient stated that she will contact [employer] for them to resend.

## 2021-07-20 NOTE — Telephone Encounter (Signed)
Pt called and stated [employer] called her and confirmed they sent over the forms. She would like if she could get a call from a nurse regarding this. Please advise.

## 2021-07-20 NOTE — Telephone Encounter (Signed)
Pt called regarding FMLA paperwork sent over from [employer].  Pt would like to know if documentation has been received. Please advise.

## 2021-07-21 NOTE — Telephone Encounter (Signed)
Patient called to make sure that we received the rest of her [place] documentation, she would like a call when we receive it.

## 2021-07-25 ENCOUNTER — Other Ambulatory Visit: Payer: Self-pay | Admitting: Family Medicine

## 2021-07-26 ENCOUNTER — Other Ambulatory Visit (HOSPITAL_COMMUNITY): Payer: Self-pay

## 2021-07-26 MED ORDER — PANTOPRAZOLE SODIUM 40 MG PO TBEC
40.0000 mg | DELAYED_RELEASE_TABLET | Freq: Every day | ORAL | 1 refills | Status: DC
  Filled 2021-07-26: qty 90, 90d supply, fill #0
  Filled 2021-11-01: qty 90, 90d supply, fill #1

## 2021-07-27 NOTE — Telephone Encounter (Signed)
Patient called back regarding her FMLA paperwork, she would like call back to know if it has been done or not. Please advice.

## 2021-07-28 NOTE — Telephone Encounter (Signed)
Paperwork faxed in [place] in [facility] folder up front

## 2021-07-28 NOTE — Telephone Encounter (Signed)
Lvm to let her know we have no received her paperwork

## 2021-07-29 NOTE — Telephone Encounter (Signed)
Paperwork faxed and copy mailed to pt

## 2021-08-04 ENCOUNTER — Other Ambulatory Visit (HOSPITAL_COMMUNITY): Payer: Self-pay

## 2021-08-05 ENCOUNTER — Other Ambulatory Visit (HOSPITAL_COMMUNITY): Payer: Self-pay

## 2021-08-17 ENCOUNTER — Ambulatory Visit (INDEPENDENT_AMBULATORY_CARE_PROVIDER_SITE_OTHER): Payer: 59 | Admitting: Endocrinology

## 2021-08-17 ENCOUNTER — Other Ambulatory Visit: Payer: Self-pay

## 2021-08-17 VITALS — BP 160/84 | HR 81 | Ht 60.0 in | Wt 242.8 lb

## 2021-08-17 DIAGNOSIS — E669 Obesity, unspecified: Secondary | ICD-10-CM

## 2021-08-17 DIAGNOSIS — E1169 Type 2 diabetes mellitus with other specified complication: Secondary | ICD-10-CM

## 2021-08-17 LAB — POCT GLYCOSYLATED HEMOGLOBIN (HGB A1C): Hemoglobin A1C: 6.5 % — AB (ref 4.0–5.6)

## 2021-08-17 NOTE — Patient Instructions (Addendum)
check your blood sugar twice a day.  vary the time of day when you check, between before the 3 meals, and at bedtime.  also check if you have symptoms of your blood sugar being too high or too low.  please keep a record of the readings and bring it to your next appointment here (or you can bring the meter itself).  You can write it on any piece of paper.  please call us sooner if your blood sugar goes below 70, or if most of your readings are over 200. please stop the glimepiride. continue the same other medications.  Please call if you continue to have low blood sugar.  Please come back for a follow-up appointment in 2 months.

## 2021-08-17 NOTE — Progress Notes (Signed)
Subjective:    Patient ID: Eileen Richards, female    DOB: January 13, 1961, 60 y.o.   MRN: [id number]  HPI Pt returns for f/u of diabetes mellitus: DM type: Insulin-requiring type 2 Dx'ed: 0865 Complications: none Therapy: insulin since 2018, Ozempic, and 2 oral meds.   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   Other: she takes QD insulin, at least for now; she did not tolerate Farxiga (urticaria) Interval history: I reviewed continuous glucose monitor data.  glucose varies from 75-270.  It increases 7PM-9PM, then decreases 12MN-3AM.  She has mild hypoglycemia almost qd.  This happens at night, or if a meal is delayed.   Past Medical History:  Diagnosis Date   Achilles tendonitis 10/14/2012   Asthma    childhood   Atypical chest pain    normal stress echo 02/2009 -LV size was normal LV global systolic function was normal- Normal wall motion; no LV regional wall motion abnormalities   Benign paroxysmal positional vertigo 04/26/2015   Chest pain, atypical 08/24/2013   Diabetes mellitus    GERD (gastroesophageal reflux disease)    Hand pain, left 11/10/2015   Hyperlipemia    Hypertension    Neuropathic pain of ankle, left 04/27/2017   Onychomycosis 04/17/2014   Pain of right heel 10/14/2012   Preventative health care 11/22/2015   Sinusitis, acute 01/17/2016   Thoracic outlet syndrome 11/03/2014   Urinary urgency 12/15/2015    Past Surgical History:  Procedure Laterality Date   ABDOMINAL HYSTERECTOMY     CYSTOSCOPY  07/23/2012   Procedure: CYSTOSCOPY;  Surgeon: Marvene Staff, MD;  Location: [facility];  Service: Gynecology;  Laterality: N/A;   DILATION AND CURETTAGE OF UTERUS     Lysis of Adhesions   ROBOTIC ASSISTED LAPAROSCOPIC LYSIS OF ADHESION  07/23/2012   Procedure: ROBOTIC ASSISTED LAPAROSCOPIC LYSIS OF ADHESION;  Surgeon: Marvene Staff, MD;  Location: [facility];  Service: Gynecology;  Laterality: N/A;   ROBOTIC ASSISTED SALPINGO OOPHERECTOMY  07/23/2012   Procedure: ROBOTIC ASSISTED SALPINGO OOPHERECTOMY;  Surgeon: Marvene Staff, MD;  Location: [facility];  Service: Gynecology;  Laterality: Bilateral;    Social History   Socioeconomic History   Marital status: Single    Spouse name: Not on file   Number of children: Not on file   Years of education: Not on file   Highest education level: Not on file  Occupational History   Not on file  Tobacco Use   Smoking status: Former   Smokeless tobacco: Never  Vaping Use   Vaping Use: Never used  Substance and Sexual Activity   Alcohol use: No   Drug use: No   Sexual activity: Not on file  Other Topics Concern   Not on file  Social History Narrative   Not on file   Social Determinants of Health   Financial Resource Strain: Not on file  Food Insecurity: Not on file  Transportation Needs: Not on file  Physical Activity: Not on file  Stress: Not on file  Social Connections: Not on file  Intimate Partner Violence: Not on file    Current Outpatient Medications on File Prior to Visit  Medication Sig Dispense Refill   Accu-Chek FastClix Lancets MISC USE AS DIRECTED TWICE DAILY TO CHECK BLOOD SUGAR. DX E11.9 102 each 6   albuterol (VENTOLIN HFA) 108 (90 Base) MCG/ACT inhaler INHALE 2 PUFFS INTO  THE LUNGS EVERY 6 (SIX) HOURS AS NEEDED FOR WHEEZING OR SHORTNESS OF BREATH. 18 g 5   aspirin 81 MG tablet Take 81 mg by mouth daily.     blood glucose meter kit and supplies KIT Dispense based on patient and insurance preference. Use up to four times daily as directed. (FOR ICD-9 250.00, 250.01). 1 each 0   Blood Glucose Monitoring Suppl (FREESTYLE FREEDOM LITE) w/Device KIT Use to check sugar twice a day.  Dx Code: E11.9 1 kit 0   carvedilol (COREG) 3.125 MG tablet Take 1 tablet (3.125 mg total) by mouth 2 (two) times daily with a meal 180 tablet 1   cetirizine (ZYRTEC)  10 MG tablet TAKE 1 TABLET BY MOUTH 2 TIMES DAILY. 200 tablet 1   clobetasol ointment (TEMOVATE) 0.05 % APPLY A SMALL AMOUNT TO VULVAR AREA TWO TIMES DAILY FOR 2 WEEKS, THEN EVERY OTHER DAY FOR 2 WEEKS, THEN MONDAY, WEDNESDAY, FRIDAY FOR 2 WEEKS 90 g 0   Continuous Blood Gluc Receiver (FREESTYLE LIBRE 2 READER) DEVI USE AS DIRECTED. 1 each 1   Continuous Blood Gluc Sensor (DEXCOM G6 SENSOR) MISC Change every 10 days as directed 9 each PRN   Continuous Blood Gluc Sensor (FREESTYLE LIBRE 2 SENSOR) MISC USE AS DIRECTED EVERY 14 DAYS. 6 each 3   COVID-19 At Home Antigen Test (CARESTART COVID-19 HOME TEST) KIT Use as directed 4 each 0   COVID-19 mRNA Vac-TriS, Pfizer, (PFIZER-BIONT COVID-19 VAC-TRIS) SUSP injection Inject into the muscle. 0.3 mL 0   diclofenac Sodium (VOLTAREN) 1 % GEL Apply 2 g topically 4 (four) times daily. Rub into affected area of foot 2 to 4 times daily 100 g 2   famotidine (PEPCID) 20 MG tablet TAKE 1 TABLET (20 MG TOTAL) BY MOUTH 2 (TWO) TIMES DAILY. 180 tablet 3   gabapentin (NEURONTIN) 300 MG capsule Take 1 capsule (300 mg total) by mouth 3 (three) times daily. 90 capsule 1   glucose blood (FREESTYLE LITE) test strip Use to check sugar twice a day.  Dx Code: E11.9 100 each 1   hydrochlorothiazide (HYDRODIURIL) 25 MG tablet Take 1 tablet (25 mg total) by mouth daily. 90 tablet 1   insulin glargine-yfgn (SEMGLEE, YFGN,) 100 UNIT/ML Pen Inject 55 Units into the skin every morning. 60 mL 3   Insulin Pen Needle 31G X 5 MM MISC USE W/ VICTOZA AND BASAGLAR 100 each 3   Lancets (FREESTYLE) lancets Use to check sugar twice a day.  Dx Code: E11.9 100 each 1   meclizine (ANTIVERT) 25 MG tablet TAKE 1 TABLET (25 MG TOTAL) BY MOUTH 3 (THREE) TIMES DAILY AS NEEDED FOR DIZZINESS. 30 tablet 0   meloxicam (MOBIC) 15 MG tablet Take 1 tablet (15 mg total) by mouth daily. 30 tablet 0   metFORMIN (GLUCOPHAGE-XR) 500 MG 24 hr tablet Take 2 tablets by mouth 2 times a day and 1 tablet at noon 450  tablet 1   nitroGLYCERIN (NITROSTAT) 0.4 MG SL tablet Place 1 tablet (0.4 mg total) under the tongue every 5 (five) minutes x 3 doses as needed for chest pain. 40 tablet 1   pantoprazole (PROTONIX) 40 MG tablet Take 1 tablet (40 mg total) by mouth daily. 90 tablet 1   potassium chloride SA (KLOR-CON) 20 MEQ tablet Take 1 tablet (20 mEq total) by mouth daily. 90 tablet 1   promethazine (PHENERGAN) 25 MG tablet Take 1 tablet (25 mg total) by mouth every 8 (eight) hours as needed for nausea or  vomiting. 20 tablet 0   rosuvastatin (CRESTOR) 5 MG tablet Take 1 tablet (5 mg total) by mouth daily. 30 tablet 3   Semaglutide, 2 MG/DOSE, (OZEMPIC, 2 MG/DOSE,) 8 MG/3ML SOPN Inject 2 mg into the skin once a week. 9 mL 3   tiZANidine (ZANAFLEX) 4 MG tablet Take 1 tablet (4 mg total) by mouth every 6 (six) hours as needed for muscle spasms. 40 tablet 1   traMADol (ULTRAM) 50 MG tablet Take 1 tablet (50 mg total) by mouth every 12 (twelve) hours as needed. 60 tablet 0   [DISCONTINUED] insulin glargine (SEMGLEE) 100 UNIT/ML Solostar Pen Inject 55 Units into the skin every morning. And pen needles 2/day 60 mL 3   No current facility-administered medications on file prior to visit.    Allergies  Allergen Reactions   Farxiga [Dapagliflozin] Hives    Family History  Problem Relation Age of Onset   GER disease Mother    GER disease Father    Heart disease Father        father has "heart problems"   Anemia Father    Asthma Other    Hypertension Other    Diabetes Other     BP (!) 160/84   Pulse 81   Ht 5' (1.524 m)   Wt 242 lb 12.8 oz (110.1 kg)   SpO2 97%   BMI 47.42 kg/m    Review of Systems Denies N/HB    Objective:   Physical Exam    Lab Results  Component Value Date   HGBA1C 6.5 (A) 08/17/2021      Assessment & Plan:  Insulin-requiring type 2 DM Hypoglycemia, due to insulin/glimepiride: this limits aggressiveness of glycemic control  Patient Instructions  check your blood  sugar twice a day.  vary the time of day when you check, between before the 3 meals, and at bedtime.  also check if you have symptoms of your blood sugar being too high or too low.  please keep a record of the readings and bring it to your next appointment here (or you can bring the meter itself).  You can write it on any piece of paper.  please call us sooner if your blood sugar goes below 70, or if most of your readings are over 200. please stop the glimepiride. continue the same other medications.  Please call if you continue to have low blood sugar.  Please come back for a follow-up appointment in 2 months.

## 2021-08-18 MED FILL — Continuous Glucose System Sensor: 28 days supply | Qty: 2 | Fill #8 | Status: AC

## 2021-08-19 ENCOUNTER — Other Ambulatory Visit (HOSPITAL_COMMUNITY): Payer: Self-pay

## 2021-08-24 ENCOUNTER — Other Ambulatory Visit (HOSPITAL_COMMUNITY): Payer: Self-pay

## 2021-08-31 ENCOUNTER — Encounter: Payer: 59 | Admitting: Family Medicine

## 2021-09-01 ENCOUNTER — Other Ambulatory Visit (HOSPITAL_COMMUNITY): Payer: Self-pay

## 2021-09-02 ENCOUNTER — Other Ambulatory Visit (HOSPITAL_COMMUNITY): Payer: Self-pay

## 2021-09-03 ENCOUNTER — Other Ambulatory Visit (HOSPITAL_COMMUNITY): Payer: Self-pay

## 2021-09-07 ENCOUNTER — Ambulatory Visit (INDEPENDENT_AMBULATORY_CARE_PROVIDER_SITE_OTHER): Payer: 59 | Admitting: Family Medicine

## 2021-09-07 ENCOUNTER — Other Ambulatory Visit (HOSPITAL_COMMUNITY): Payer: Self-pay

## 2021-09-07 DIAGNOSIS — R0789 Other chest pain: Secondary | ICD-10-CM

## 2021-09-07 DIAGNOSIS — I10 Essential (primary) hypertension: Secondary | ICD-10-CM

## 2021-09-07 DIAGNOSIS — E782 Mixed hyperlipidemia: Secondary | ICD-10-CM | POA: Diagnosis not present

## 2021-09-07 DIAGNOSIS — Z0000 Encounter for general adult medical examination without abnormal findings: Secondary | ICD-10-CM

## 2021-09-07 DIAGNOSIS — E6609 Other obesity due to excess calories: Secondary | ICD-10-CM

## 2021-09-07 DIAGNOSIS — E1169 Type 2 diabetes mellitus with other specified complication: Secondary | ICD-10-CM

## 2021-09-07 NOTE — Progress Notes (Signed)
Patient ID: Eileen Richards, female    DOB: 1961-07-07  Age: 60 y.o. MRN: [id number]    Subjective:   No chief complaint on file.  Subjective  HPI Eileen Richards presents for office visit today for comprehensive physical exam today and follow up on management of chronic concerns. She is doing well, but recent experienced CP. However, besides this she has no febrile illnesses or recent ER visits to report.   Vertigo/dizziness: Experiences vertigo when bending over or laying back on flat surface  Left sided CP: She states that the pain does not wake her up. It can happen when she is sitting or walking. She reports that it lasts for a few seconds and frequency is about a couple of times every couple of weeks.She denies any accompanying symptoms of SOB or radiating pain. Denies palp/SOB/HA/congestion/fevers/GI or GU c/o. Taking meds as prescribed.  Recent falls: Last fall was 2-3 months ago. She did not injure herself. She was walking and carrying too much at hand and as a result fell and could not catch herself.  Semaglutide $RemoveBefo'2mg'FDllktdxGjK$ : She endorses Semaglutide helping curb her appetite and was able to successfully lose 10 lbs. Currently, she is not experiencing an adverse side effects like nausea or stomach upset.  FMHx: Father has mild dementia   Review of Systems  Constitutional:  Negative for chills, fatigue and fever.  HENT:  Negative for congestion, rhinorrhea, sinus pressure, sinus pain, sore throat and trouble swallowing.   Eyes:  Negative for pain.  Respiratory:  Negative for cough and shortness of breath.   Cardiovascular:  Positive for chest pain. Negative for palpitations and leg swelling.  Gastrointestinal:  Negative for abdominal pain, blood in stool, diarrhea, nausea and vomiting.  Genitourinary:  Negative for decreased urine volume, flank pain, frequency, vaginal bleeding and vaginal discharge.  Musculoskeletal:  Negative for back pain.  Neurological:  Positive for dizziness.  Negative for headaches.   History Past Medical History:  Diagnosis Date   Achilles tendonitis 10/14/2012   Asthma    childhood   Atypical chest pain    normal stress echo 02/2009 -LV size was normal LV global systolic function was normal- Normal wall motion; no LV regional wall motion abnormalities   Benign paroxysmal positional vertigo 04/26/2015   Chest pain, atypical 08/24/2013   Diabetes mellitus    GERD (gastroesophageal reflux disease)    Hand pain, left 11/10/2015   Hyperlipemia    Hypertension    Neuropathic pain of ankle, left 04/27/2017   Onychomycosis 04/17/2014   Pain of right heel 10/14/2012   Preventative health care 11/22/2015   Sinusitis, acute 01/17/2016   Thoracic outlet syndrome 11/03/2014   Urinary urgency 12/15/2015    She has a past surgical history that includes Abdominal hysterectomy; Dilation and curettage of uterus; Robotic assisted salpingo oophorectomy (07/23/2012); Robotic assisted laparoscopic lysis of adhesion (07/23/2012); and Cystoscopy (07/23/2012).   Her family history includes Anemia in her father; Asthma in an other family member; Diabetes in an other family member; GER disease in her father and mother; Heart disease in her father; Hypertension in an other family member.She reports that she has quit smoking. She has never used smokeless tobacco. She reports that she does not drink alcohol and does not use drugs.  Current Outpatient Medications on File Prior to Visit  Medication Sig Dispense Refill   Accu-Chek FastClix Lancets MISC USE AS DIRECTED TWICE DAILY TO CHECK BLOOD SUGAR. DX E11.9 102 each 6   albuterol (VENTOLIN HFA) 108 (90 Base)  MCG/ACT inhaler INHALE 2 PUFFS INTO THE LUNGS EVERY 6 (SIX) HOURS AS NEEDED FOR WHEEZING OR SHORTNESS OF BREATH. 18 g 5   aspirin 81 MG tablet Take 81 mg by mouth daily.     blood glucose meter kit and supplies KIT Dispense based on patient and insurance preference. Use up to four times daily as directed. (FOR ICD-9 250.00,  250.01). 1 each 0   Blood Glucose Monitoring Suppl (FREESTYLE FREEDOM LITE) w/Device KIT Use to check sugar twice a day.  Dx Code: E11.9 1 kit 0   carvedilol (COREG) 3.125 MG tablet Take 1 tablet (3.125 mg total) by mouth 2 (two) times daily with a meal 180 tablet 1   cetirizine (ZYRTEC) 10 MG tablet TAKE 1 TABLET BY MOUTH 2 TIMES DAILY. 200 tablet 1   clobetasol ointment (TEMOVATE) 0.05 % APPLY A SMALL AMOUNT TO VULVAR AREA TWO TIMES DAILY FOR 2 WEEKS, THEN EVERY OTHER DAY FOR 2 WEEKS, THEN MONDAY, WEDNESDAY, FRIDAY FOR 2 WEEKS 90 g 0   Continuous Blood Gluc Receiver (FREESTYLE LIBRE 2 READER) DEVI USE AS DIRECTED. 1 each 1   Continuous Blood Gluc Sensor (FREESTYLE LIBRE 2 SENSOR) MISC USE AS DIRECTED EVERY 14 DAYS. 6 each 3   COVID-19 At Home Antigen Test (CARESTART COVID-19 HOME TEST) KIT Use as directed 4 each 0   COVID-19 mRNA Vac-TriS, Pfizer, (PFIZER-BIONT COVID-19 VAC-TRIS) SUSP injection Inject into the muscle. 0.3 mL 0   diclofenac Sodium (VOLTAREN) 1 % GEL Apply 2 g topically 4 (four) times daily. Rub into affected area of foot 2 to 4 times daily 100 g 2   famotidine (PEPCID) 20 MG tablet TAKE 1 TABLET (20 MG TOTAL) BY MOUTH 2 (TWO) TIMES DAILY. 180 tablet 3   gabapentin (NEURONTIN) 300 MG capsule Take 1 capsule (300 mg total) by mouth 3 (three) times daily. 90 capsule 1   glucose blood (FREESTYLE LITE) test strip Use to check sugar twice a day.  Dx Code: E11.9 100 each 1   hydrochlorothiazide (HYDRODIURIL) 25 MG tablet Take 1 tablet (25 mg total) by mouth daily. 90 tablet 1   Insulin Pen Needle 31G X 5 MM MISC USE W/ VICTOZA AND BASAGLAR 100 each 3   Lancets (FREESTYLE) lancets Use to check sugar twice a day.  Dx Code: E11.9 100 each 1   meclizine (ANTIVERT) 25 MG tablet TAKE 1 TABLET (25 MG TOTAL) BY MOUTH 3 (THREE) TIMES DAILY AS NEEDED FOR DIZZINESS. 30 tablet 0   meloxicam (MOBIC) 15 MG tablet Take 1 tablet (15 mg total) by mouth daily. 30 tablet 0   metFORMIN (GLUCOPHAGE-XR) 500 MG  24 hr tablet Take 2 tablets by mouth 2 times a day and 1 tablet at noon 450 tablet 1   potassium chloride SA (KLOR-CON) 20 MEQ tablet Take 1 tablet (20 mEq total) by mouth daily. 90 tablet 1   promethazine (PHENERGAN) 25 MG tablet Take 1 tablet (25 mg total) by mouth every 8 (eight) hours as needed for nausea or vomiting. 20 tablet 0   rosuvastatin (CRESTOR) 5 MG tablet Take 1 tablet (5 mg total) by mouth daily. 30 tablet 3   Semaglutide, 2 MG/DOSE, (OZEMPIC, 2 MG/DOSE,) 8 MG/3ML SOPN Inject 2 mg into the skin once a week. 9 mL 3   tiZANidine (ZANAFLEX) 4 MG tablet Take 1 tablet (4 mg total) by mouth every 6 (six) hours as needed for muscle spasms. 40 tablet 1   traMADol (ULTRAM) 50 MG tablet Take 1 tablet (50  mg total) by mouth every 12 (twelve) hours as needed. 60 tablet 0   insulin glargine-yfgn (SEMGLEE, YFGN,) 100 UNIT/ML Pen Inject 55 Units into the skin every morning. 60 mL 3   nitroGLYCERIN (NITROSTAT) 0.4 MG SL tablet Place 1 tablet (0.4 mg total) under the tongue every 5 (five) minutes x 3 doses as needed for chest pain. 40 tablet 1   pantoprazole (PROTONIX) 40 MG tablet Take 1 tablet (40 mg total) by mouth daily. 90 tablet 1   [DISCONTINUED] insulin glargine (SEMGLEE) 100 UNIT/ML Solostar Pen Inject 55 Units into the skin every morning. And pen needles 2/day 60 mL 3   No current facility-administered medications on file prior to visit.     Objective:  Objective  Physical Exam Constitutional:      General: She is not in acute distress.    Appearance: Normal appearance. She is not ill-appearing or toxic-appearing.  HENT:     Head: Normocephalic and atraumatic.     Right Ear: Tympanic membrane, ear canal and external ear normal.     Left Ear: Tympanic membrane, ear canal and external ear normal.     Nose: No congestion or rhinorrhea.  Eyes:     Extraocular Movements: Extraocular movements intact.     Right eye: No nystagmus.     Left eye: No nystagmus.     Pupils: Pupils are  equal, round, and reactive to light.  Cardiovascular:     Rate and Rhythm: Normal rate and regular rhythm.     Pulses: Normal pulses.          Posterior tibial pulses are 2+ on the right side and 2+ on the left side.     Heart sounds: Normal heart sounds. No murmur heard. Pulmonary:     Effort: Pulmonary effort is normal. No respiratory distress.     Breath sounds: Normal breath sounds. No wheezing, rhonchi or rales.  Abdominal:     General: Bowel sounds are normal.     Palpations: Abdomen is soft. There is no mass.     Tenderness: There is no abdominal tenderness. There is no guarding.     Hernia: No hernia is present.  Musculoskeletal:        General: Normal range of motion.     Cervical back: Normal range of motion and neck supple.  Skin:    General: Skin is warm and dry.  Neurological:     Mental Status: She is alert and oriented to person, place, and time.     Cranial Nerves: No facial asymmetry.     Motor: Motor function is intact. No weakness.     Deep Tendon Reflexes:     Reflex Scores:      Patellar reflexes are 2+ on the right side and 2+ on the left side. Psychiatric:        Behavior: Behavior normal.   There were no vitals taken for this visit. Wt Readings from Last 3 Encounters:  08/17/21 242 lb 12.8 oz (110.1 kg)  06/09/21 243 lb 6.4 oz (110.4 kg)  03/24/21 250 lb 9.6 oz (113.7 kg)     Lab Results  Component Value Date   WBC 9.3 06/01/2021   HGB 13.2 06/01/2021   HCT 40.5 06/01/2021   PLT 458.0 (H) 06/01/2021   GLUCOSE 59 (L) 01/14/2021   CHOL 239 (H) 01/14/2021   TRIG 174.0 (H) 01/14/2021   HDL 36.80 (L) 01/14/2021   LDLDIRECT 100.0 08/03/2017   LDLCALC 167 (H) 01/14/2021  ALT 15 01/14/2021   AST 16 01/14/2021   NA 139 01/14/2021   K 4.0 01/14/2021   CL 99 01/14/2021   CREATININE 0.70 01/14/2021   BUN 14 01/14/2021   CO2 32 01/14/2021   TSH 1.56 01/14/2021   HGBA1C 6.5 (A) 08/17/2021   MICROALBUR <0.7 01/14/2021    DG Knee 3 Views  Right  Result Date: 04/03/2021 CLINICAL DATA:  Right knee pain EXAM: RIGHT KNEE - 3 VIEW COMPARISON:  01/02/2012 FINDINGS: No fracture or malalignment. Small knee effusion. The joint spaces are patent. IMPRESSION: Small knee effusion.  No acute osseous abnormality Electronically Signed   By: Donavan Foil M.D.   On: 04/03/2021 19:08     Assessment & Plan:  Plan    No orders of the defined types were placed in this encounter.   Problem List Items Addressed This Visit     Diabetes mellitus type 2 in obese ([place])    hgba1c acceptable, minimize simple carbs. Increase exercise as tolerated. Continue current meds. Continue Ozempic      Hyperlipidemia, mixed - Primary    Tolerating statin, encouraged heart healthy diet, avoid trans fats, minimize simple carbs and saturated fats. Increase exercise as tolerated      Relevant Orders   CBC with Differential/Platelet   Comprehensive metabolic panel   TSH   Lipid panel   Obesity    Encouraged DASH or MIND diet, decrease po intake and increase exercise as tolerated. Needs 7-8 hours of sleep nightly. Avoid trans fats, eat small, frequent meals every 4-5 hours with lean proteins, complex carbs and healthy fats. Minimize simple carbs, high fat foods and processed foods      Essential hypertension    Well controlled, no changes to meds. Encouraged heart healthy diet such as the DASH diet and exercise as tolerated.       Relevant Orders   CBC with Differential/Platelet   Comprehensive metabolic panel   TSH   Lipid panel   Chest pain, atypical    Has had about an episode a week lately no associated symptoms but with risk factors will refer back to cardiology for reevaluation      Preventative health care    Patient encouraged to maintain heart healthy diet, regular exercise, adequate sleep. Consider daily probiotics. Take medications as prescribed. Labs ordered and reviewed. Colonoscopy was 2019 repeat in 2024. Follows with OB/GYN for paps  and MGM      Other Visit Diagnoses     Atypical chest pain       Relevant Orders   EKG 12-Lead (Completed)   Ambulatory referral to Cardiology       Follow-up: Return in about 6 months (around 03/08/2022) for f/u visit.  I, Suezanne Jacquet, acting as a scribe for Penni Homans, MD, have documented all relevent documentation on behalf of Penni Homans, MD, as directed by Penni Homans, MD while in the presence of Penni Homans, MD. DO:09/08/21.  I, Mosie Lukes, MD personally performed the services described in this documentation. All medical record entries made by the scribe were at my direction and in my presence. I have reviewed the chart and agree that the record reflects my personal performance and is accurate and complete

## 2021-09-07 NOTE — Assessment & Plan Note (Signed)
Well controlled, no changes to meds. Encouraged heart healthy diet such as the DASH diet and exercise as tolerated.  °

## 2021-09-07 NOTE — Patient Instructions (Addendum)
Multivitamin with minerals, make sure it has selenium in it  Preventive Care 45-58 Years Old, Female Preventive care refers to lifestyle choices and visits with your health care provider that can promote health and wellness. Preventive care visits are also called wellness exams. What can I expect for my preventive care visit? Counseling Your health care provider may ask you questions about your: Medical history, including: Past medical problems. Family medical history. Pregnancy history. Current health, including: Menstrual cycle. Method of birth control. Emotional well-being. Home life and relationship well-being. Sexual activity and sexual health. Lifestyle, including: Alcohol, nicotine or tobacco, and drug use. Access to firearms. Diet, exercise, and sleep habits. Work and work Statistician. Sunscreen use. Safety issues such as seatbelt and bike helmet use. Physical exam Your health care provider will check your: Height and weight. These may be used to calculate your BMI (body mass index). BMI is a measurement that tells if you are at a healthy weight. Waist circumference. This measures the distance around your waistline. This measurement also tells if you are at a healthy weight and may help predict your risk of certain diseases, such as type 2 diabetes and high blood pressure. Heart rate and blood pressure. Body temperature. Skin for abnormal spots. What immunizations do I need? Vaccines are usually given at various ages, according to a schedule. Your health care provider will recommend vaccines for you based on your age, medical history, and lifestyle or other factors, such as travel or where you work. What tests do I need? Screening Your health care provider may recommend screening tests for certain conditions. This may include: Lipid and cholesterol levels. Diabetes screening. This is done by checking your blood sugar (glucose) after you have not eaten for a while  (fasting). Pelvic exam and Pap test. Hepatitis B test. Hepatitis C test. HIV (human immunodeficiency virus) test. STI (sexually transmitted infection) testing, if you are at risk. Lung cancer screening. Colorectal cancer screening. Mammogram. Talk with your health care provider about when you should start having regular mammograms. This may depend on whether you have a family history of breast cancer. BRCA-related cancer screening. This may be done if you have a family history of breast, ovarian, tubal, or peritoneal cancers. Bone density scan. This is done to screen for osteoporosis. Talk with your health care provider about your test results, treatment options, and if necessary, the need for more tests. Follow these instructions at home: Eating and drinking  Eat a diet that includes fresh fruits and vegetables, whole grains, lean protein, and low-fat dairy products. Take vitamin and mineral supplements as recommended by your health care provider. Do not drink alcohol if: Your health care provider tells you not to drink. You are pregnant, may be pregnant, or are planning to become pregnant. If you drink alcohol: Limit how much you have to 0-1 drink a day. Know how much alcohol is in your drink. In the [place] one drink equals one 12 oz bottle of beer (355 mL), one 5 oz glass of wine (148 mL), or one 1 oz glass of hard liquor (44 mL). Lifestyle Brush your teeth every morning and night with fluoride toothpaste. Floss one time each day. Exercise for at least 30 minutes 5 or more days each week. Do not use any products that contain nicotine or tobacco. These products include cigarettes, chewing tobacco, and vaping devices, such as e-cigarettes. If you need help quitting, ask your health care provider. Do not use drugs. If you are sexually active, practice safe  sex. Use a condom or other form of protection to prevent STIs. If you do not wish to become pregnant, use a form of birth control. If  you plan to become pregnant, see your health care provider for a prepregnancy visit. Take aspirin only as told by your health care provider. Make sure that you understand how much to take and what form to take. Work with your health care provider to find out whether it is safe and beneficial for you to take aspirin daily. Find healthy ways to manage stress, such as: Meditation, yoga, or listening to music. Journaling. Talking to a trusted person. Spending time with friends and family. Minimize exposure to UV radiation to reduce your risk of skin cancer. Safety Always wear your seat belt while driving or riding in a vehicle. Do not drive: If you have been drinking alcohol. Do not ride with someone who has been drinking. When you are tired or distracted. While texting. If you have been using any mind-altering substances or drugs. Wear a helmet and other protective equipment during sports activities. If you have firearms in your house, make sure you follow all gun safety procedures. Seek help if you have been physically or sexually abused. What's next? Visit your health care provider once a year for an annual wellness visit. Ask your health care provider how often you should have your eyes and teeth checked. Stay up to date on all vaccines. This information is not intended to replace advice given to you by your health care provider. Make sure you discuss any questions you have with your health care provider. Document Revised: 03/03/2021 Document Reviewed: 03/03/2021 Elsevier Patient Education  [place].

## 2021-09-08 NOTE — Assessment & Plan Note (Signed)
Tolerating statin, encouraged heart healthy diet, avoid trans fats, minimize simple carbs and saturated fats. Increase exercise as tolerated 

## 2021-09-08 NOTE — Assessment & Plan Note (Signed)
Encouraged DASH or MIND diet, decrease po intake and increase exercise as tolerated. Needs 7-8 hours of sleep nightly. Avoid trans fats, eat small, frequent meals every 4-5 hours with lean proteins, complex carbs and healthy fats. Minimize simple carbs, high fat foods and processed foods 

## 2021-09-08 NOTE — Assessment & Plan Note (Signed)
hgba1c acceptable, minimize simple carbs. Increase exercise as tolerated. Continue current meds. Continue Ozempic

## 2021-09-08 NOTE — Assessment & Plan Note (Signed)
Has had about an episode a week lately no associated symptoms but with risk factors will refer back to cardiology for reevaluation

## 2021-09-08 NOTE — Assessment & Plan Note (Addendum)
Patient encouraged to maintain heart healthy diet, regular exercise, adequate sleep. Consider daily probiotics. Take medications as prescribed. Labs ordered and reviewed. Colonoscopy was 2019 repeat in 2024. Follows with OB/GYN for paps and MGM

## 2021-09-15 MED FILL — Continuous Glucose System Sensor: 28 days supply | Qty: 2 | Fill #9 | Status: AC

## 2021-09-15 MED FILL — Famotidine Tab 20 MG: ORAL | 90 days supply | Qty: 180 | Fill #2 | Status: AC

## 2021-09-16 ENCOUNTER — Other Ambulatory Visit (HOSPITAL_COMMUNITY): Payer: Self-pay

## 2021-09-23 ENCOUNTER — Other Ambulatory Visit (HOSPITAL_COMMUNITY): Payer: Self-pay

## 2021-10-05 ENCOUNTER — Other Ambulatory Visit (HOSPITAL_COMMUNITY): Payer: Self-pay

## 2021-10-06 DIAGNOSIS — K5730 Diverticulosis of large intestine without perforation or abscess without bleeding: Secondary | ICD-10-CM | POA: Insufficient documentation

## 2021-10-06 DIAGNOSIS — K625 Hemorrhage of anus and rectum: Secondary | ICD-10-CM | POA: Insufficient documentation

## 2021-10-06 DIAGNOSIS — Z860100 Personal history of colon polyps, unspecified: Secondary | ICD-10-CM | POA: Insufficient documentation

## 2021-10-07 ENCOUNTER — Other Ambulatory Visit (HOSPITAL_COMMUNITY): Payer: Self-pay

## 2021-10-07 ENCOUNTER — Other Ambulatory Visit: Payer: Self-pay

## 2021-10-07 ENCOUNTER — Ambulatory Visit: Payer: 59 | Admitting: Cardiology

## 2021-10-07 ENCOUNTER — Encounter: Payer: Self-pay | Admitting: Cardiology

## 2021-10-07 ENCOUNTER — Ambulatory Visit: Payer: 59 | Attending: Internal Medicine

## 2021-10-07 ENCOUNTER — Other Ambulatory Visit (INDEPENDENT_AMBULATORY_CARE_PROVIDER_SITE_OTHER): Payer: 59

## 2021-10-07 VITALS — BP 122/80 | HR 97 | Ht 64.0 in | Wt 242.0 lb

## 2021-10-07 DIAGNOSIS — E782 Mixed hyperlipidemia: Secondary | ICD-10-CM

## 2021-10-07 DIAGNOSIS — I10 Essential (primary) hypertension: Secondary | ICD-10-CM

## 2021-10-07 DIAGNOSIS — E6609 Other obesity due to excess calories: Secondary | ICD-10-CM | POA: Diagnosis not present

## 2021-10-07 DIAGNOSIS — Z23 Encounter for immunization: Secondary | ICD-10-CM

## 2021-10-07 DIAGNOSIS — R0789 Other chest pain: Secondary | ICD-10-CM | POA: Diagnosis not present

## 2021-10-07 MED ORDER — ROSUVASTATIN CALCIUM 20 MG PO TABS
20.0000 mg | ORAL_TABLET | Freq: Every day | ORAL | 3 refills | Status: DC
  Filled 2021-10-07: qty 90, 90d supply, fill #0
  Filled 2022-01-26: qty 90, 90d supply, fill #1
  Filled 2022-05-10: qty 90, 90d supply, fill #2
  Filled 2022-08-12: qty 90, 90d supply, fill #3

## 2021-10-07 MED ORDER — METOPROLOL TARTRATE 100 MG PO TABS
100.0000 mg | ORAL_TABLET | Freq: Once | ORAL | 0 refills | Status: DC
  Filled 2021-10-07: qty 1, 1d supply, fill #0

## 2021-10-07 MED FILL — Continuous Glucose System Sensor: 28 days supply | Qty: 2 | Fill #10 | Status: AC

## 2021-10-07 NOTE — Addendum Note (Signed)
Addended by: Edwyna Shell I on: 10/07/2021 03:17 PM   Modules accepted: Orders

## 2021-10-07 NOTE — Patient Instructions (Addendum)
.Medication Instructions:  Your physician has recommended you make the following change in your medication:   START: Crestor 20 mg daily  *If you need a refill on your cardiac medications before your next appointment, please call your pharmacy*   Lab Work: LFT, Lipid in 6 weeks If you have labs (blood work) drawn today and your tests are completely normal, you will receive your results only by: [place] (if you have MyChart) OR A paper copy in the mail If you have any lab test that is abnormal or we need to change your treatment, we will call you to review the results.   Testing/Procedures:   Your cardiac CT will be scheduled at one of the below locations:   [facility] [address] [place], [place] [address] [phone]  [place] [address] [place], [place] [address] [phone]  If scheduled at [facility], please arrive at the [facility] main entrance (entrance A) of [facility] 30 minutes prior to test start time. You can use the FREE valet parking offered at the main entrance (encouraged to control the heart rate for the test) Proceed to the [facility] Radiology Department (first floor) to check-in and test prep.  If scheduled at [facility], please arrive 15 mins early for check-in and test prep.  Please follow these instructions carefully (unless otherwise directed):   On the Night Before the Test: Be sure to Drink plenty of water. Do not consume any caffeinated/decaffeinated beverages or chocolate 12 hours prior to your test. Do not take any antihistamines 12 hours prior to your test.   On the Day of the Test: Drink plenty of water until 1 hour prior to the test. Do not eat any food 4 hours prior to the test. You may take your regular medications prior to the test.  Take metoprolol (Lopressor) two hours prior to test. HOLD  Hydrochlorothiazide morning of the test. FEMALES- please wear underwire-free bra if available, avoid dresses & tight clothing       After the Test: Drink plenty of water. After receiving IV contrast, you may experience a mild flushed feeling. This is normal. On occasion, you may experience a mild rash up to 24 hours after the test. This is not dangerous. If this occurs, you can take Benadryl 25 mg and increase your fluid intake. If you experience trouble breathing, this can be serious. If it is severe call 911 IMMEDIATELY. If it is mild, please call our office. If you take any of these medications: Glipizide/Metformin, Avandament, Glucavance, please do not take 48 hours after completing test unless otherwise instructed.  We will call to schedule your test 2-4 weeks out understanding that some insurance companies will need an authorization prior to the service being performed.   For non-scheduling related questions, please contact the cardiac imaging nurse navigator should you have any questions/concerns: Marchia Bond, Cardiac Imaging Nurse Navigator Gordy Clement, Cardiac Imaging Nurse Navigator [facility] Direct Office Dial: [phone]   For scheduling needs, including cancellations and rescheduling, please call [place], [phone].    Follow-Up: At [facility], you and your health needs are our priority.  As part of our continuing mission to provide you with exceptional heart care, we have created designated Provider Care Teams.  These Care Teams include your primary Cardiologist (physician) and Advanced Practice Providers (APPs -  Physician Assistants and Nurse Practitioners) who all work together  to provide you with the care you need, when you need it.  We recommend signing up for the patient portal called "MyChart".  Sign up information is provided on this After Visit Summary.  MyChart is used to connect with patients for Virtual Visits  (Telemedicine).  Patients are able to view lab/test results, encounter notes, upcoming appointments, etc.  Non-urgent messages can be sent to your provider as well.   To learn more about what you can do with MyChart, go to [email or web address].    Your next appointment:   3 month(s)  The format for your next appointment:   In Person  Provider:   Jenne Campus, MD    Other Instructions None

## 2021-10-07 NOTE — Progress Notes (Signed)
Cardiology Consultation:    Date:  10/07/2021   ID:  Eileen Richards, DOB 1961-07-04, MRN [id number]  PCP:  Mosie Lukes, MD  Cardiologist:  Jenne Campus, MD   Referring MD: Mosie Lukes, MD   Chief Complaint  Patient presents with   Chest Pain    History of Present Illness:    Eileen Richards is a 61 y.o. female who is being seen today for the evaluation of chest pain at the request of Mosie Lukes, MD. lady with multiple risk factors for coronary artery disease she is an ex-smoker, quit smoking about 15 years ago, does have history of poorly controlled diabetes but now fairly well controlled, dyslipidemia with poorly controlled LDL she was referred to [place] because of atypical chest pain.  Apparent history of her chest pains quite long in 2019 she did have some chest pain stress test has been done at that time which showed no evidence of ischemia however at this time pain is different.  It is located in the left side of her chest kind of heavy sensation.  There is no sweating no shortness of breath associated with this sensation.  Pain can be on and off almost all day.  There is no provoking no relieving factor.  Interestingly she works physically and she is able to walk and climb stairs she gets short of breath but does not get any chest pain.  Past Medical History:  Diagnosis Date   Achilles tendonitis 10/14/2012   Asthma    childhood   Atypical chest pain    normal stress echo 02/2009 -LV size was normal LV global systolic function was normal- Normal wall motion; no LV regional wall motion abnormalities   Benign paroxysmal positional vertigo 04/26/2015   Chest pain, atypical 08/24/2013   Diabetes mellitus    GERD (gastroesophageal reflux disease)    Hand pain, left 11/10/2015   Hyperlipemia    Hypertension    Neuropathic pain of ankle, left 04/27/2017   Onychomycosis 04/17/2014   Pain of right heel 10/14/2012   Preventative health care 11/22/2015   Sinusitis, acute  01/17/2016   Thoracic outlet syndrome 11/03/2014   Urinary urgency 12/15/2015    Past Surgical History:  Procedure Laterality Date   ABDOMINAL HYSTERECTOMY     CYSTOSCOPY  07/23/2012   Procedure: CYSTOSCOPY;  Surgeon: Marvene Staff, MD;  Location: [facility];  Service: Gynecology;  Laterality: N/A;   DILATION AND CURETTAGE OF UTERUS     Lysis of Adhesions   ROBOTIC ASSISTED LAPAROSCOPIC LYSIS OF ADHESION  07/23/2012   Procedure: ROBOTIC ASSISTED LAPAROSCOPIC LYSIS OF ADHESION;  Surgeon: Marvene Staff, MD;  Location: [facility];  Service: Gynecology;  Laterality: N/A;   ROBOTIC ASSISTED SALPINGO OOPHERECTOMY  07/23/2012   Procedure: ROBOTIC ASSISTED SALPINGO OOPHERECTOMY;  Surgeon: Marvene Staff, MD;  Location: [facility];  Service: Gynecology;  Laterality: Bilateral;    Current Medications: Current Meds  Medication Sig   Accu-Chek FastClix Lancets MISC USE AS DIRECTED TWICE DAILY TO CHECK BLOOD SUGAR. DX E11.9 (Patient taking differently: 1 each by Other route See admin instructions. Use as directed twice daily to check blood sugar.  DX E11.9)   albuterol (VENTOLIN HFA) 108 (90 Base) MCG/ACT inhaler INHALE 2 PUFFS INTO THE LUNGS EVERY 6 (SIX) HOURS AS NEEDED FOR WHEEZING OR SHORTNESS OF BREATH.   aspirin 81 MG tablet Take 81 mg by mouth daily.   blood glucose meter kit and supplies KIT Dispense based on patient and  insurance preference. Use up to four times daily as directed. (FOR ICD-9 250.00, 250.01). (Patient taking differently: Inject 1 each into the skin as directed. Dispense based on patient and insurance preference. Use up to four times daily as directed. (FOR ICD-9 250.00, 250.01).)   Blood Glucose Monitoring Suppl (FREESTYLE FREEDOM LITE) w/Device KIT Use to check sugar twice a day.  Dx Code: E11.9 (Patient taking differently: 1 each by Other route See admin instructions. Use to check sugar twice a day.  Dx Code: E11.9)   carvedilol (COREG) 3.125 MG tablet Take 1 tablet (3.125  mg total) by mouth 2 (two) times daily with a meal   cetirizine (ZYRTEC) 10 MG tablet TAKE 1 TABLET BY MOUTH 2 TIMES DAILY. (Patient taking differently: Take 10 mg by mouth 2 (two) times daily.)   Continuous Blood Gluc Receiver (FREESTYLE LIBRE 2 READER) DEVI USE AS DIRECTED. (Patient taking differently: 1 each by Other route See admin instructions. Glucose reading)   Continuous Blood Gluc Sensor (FREESTYLE LIBRE 2 SENSOR) MISC USE AS DIRECTED EVERY 14 DAYS. (Patient taking differently: 1 each by Other route See admin instructions. Glucose reading)   diclofenac Sodium (VOLTAREN) 1 % GEL Apply 2 g topically 4 (four) times daily. Rub into affected area of foot 2 to 4 times daily (Patient taking differently: Apply 2 g topically as needed (Joint pain). Rub into affected area of foot 2 to 4 times daily)   famotidine (PEPCID) 20 MG tablet TAKE 1 TABLET (20 MG TOTAL) BY MOUTH 2 (TWO) TIMES DAILY. (Patient taking differently: Take 20 mg by mouth 2 (two) times daily.)   gabapentin (NEURONTIN) 300 MG capsule Take 1 capsule (300 mg total) by mouth 3 (three) times daily. (Patient taking differently: Take 300 mg by mouth as needed (Nerve pain).)   insulin glargine-yfgn (SEMGLEE, YFGN,) 100 UNIT/ML Pen Inject 55 Units into the skin every morning. (Patient taking differently: Inject 56 Units into the skin every morning.)   Insulin Pen Needle 31G X 5 MM MISC USE W/ VICTOZA AND BASAGLAR (Patient taking differently: 1 each by Other route See admin instructions. To be used with Victoza and Insulin)   meclizine (ANTIVERT) 25 MG tablet TAKE 1 TABLET (25 MG TOTAL) BY MOUTH 3 (THREE) TIMES DAILY AS NEEDED FOR DIZZINESS. (Patient taking differently: Take 25 mg by mouth 3 (three) times daily as needed for dizziness.)   meloxicam (MOBIC) 15 MG tablet Take 1 tablet (15 mg total) by mouth daily.   metFORMIN (GLUCOPHAGE-XR) 500 MG 24 hr tablet Take 2 tablets by mouth 2 times a day and 1 tablet at noon (Patient taking differently:  Take 1,000 mg by mouth 2 (two) times daily.)   nitroGLYCERIN (NITROSTAT) 0.4 MG SL tablet Place 1 tablet (0.4 mg total) under the tongue every 5 (five) minutes x 3 doses as needed for chest pain.   pantoprazole (PROTONIX) 40 MG tablet Take 1 tablet (40 mg total) by mouth daily.   potassium chloride SA (KLOR-CON) 20 MEQ tablet Take 1 tablet (20 mEq total) by mouth daily.   promethazine (PHENERGAN) 25 MG tablet Take 1 tablet (25 mg total) by mouth every 8 (eight) hours as needed for nausea or vomiting.   rosuvastatin (CRESTOR) 5 MG tablet Take 1 tablet (5 mg total) by mouth daily.   Semaglutide, 2 MG/DOSE, (OZEMPIC, 2 MG/DOSE,) 8 MG/3ML SOPN Inject 2 mg into the skin once a week.   tiZANidine (ZANAFLEX) 4 MG tablet Take 1 tablet (4 mg total) by mouth every 6 (six)  hours as needed for muscle spasms.   traMADol (ULTRAM) 50 MG tablet Take 1 tablet (50 mg total) by mouth every 12 (twelve) hours as needed. (Patient taking differently: Take 50 mg by mouth every 12 (twelve) hours as needed for moderate pain or severe pain.)     Allergies:   [place] [dapagliflozin]   Social History   Socioeconomic History   Marital status: Single    Spouse name: Not on file   Number of children: Not on file   Years of education: Not on file   Highest education level: Not on file  Occupational History   Not on file  Tobacco Use   Smoking status: Former   Smokeless tobacco: Never  Vaping Use   Vaping Use: Never used  Substance and Sexual Activity   Alcohol use: No   Drug use: No   Sexual activity: Not on file  Other Topics Concern   Not on file  Social History Narrative   Not on file   Social Determinants of Health   Financial Resource Strain: Not on file  Food Insecurity: Not on file  Transportation Needs: Not on file  Physical Activity: Not on file  Stress: Not on file  Social Connections: Not on file     Family History: The patient's family history includes Anemia in her father; Asthma in an  other family member; Diabetes in an other family member; GER disease in her father and mother; Heart disease in her father; Hypertension in an other family member. ROS:   Please see the history of present illness.    All 14 point review of systems negative except as described per history of present illness.  EKGs/Labs/Other Studies Reviewed:    The following studies were reviewed today:   EKG:  EKG is  ordered today.  The ekg ordered today demonstrates normal sinus rhythm, low voltage, nonspecific ST segment changes  Recent Labs: 01/14/2021: ALT 15; BUN 14; Creatinine, Ser 0.70; Potassium 4.0; Sodium 139; TSH 1.56 06/01/2021: Hemoglobin 13.2; Platelets 458.0  Recent Lipid Panel    Component Value Date/Time   CHOL 239 (H) 01/14/2021 1429   TRIG 174.0 (H) 01/14/2021 1429   HDL 36.80 (L) 01/14/2021 1429   CHOLHDL 6 01/14/2021 1429   VLDL 34.8 01/14/2021 1429   LDLCALC 167 (H) 01/14/2021 1429   LDLDIRECT 100.0 08/03/2017 1652    Physical Exam:    VS:  BP 122/80 (BP Location: Right Arm, Patient Position: Sitting)    Pulse 97    Ht _0  (1.626 m)    Wt 242 lb (109.8 kg)    BMI 41.54 kg/m     Wt Readings from Last 3 Encounters:  10/07/21 242 lb (109.8 kg)  08/17/21 242 lb 12.8 oz (110.1 kg)  06/09/21 243 lb 6.4 oz (110.4 kg)     GEN:  Well nourished, well developed in no acute distress HEENT: Normal NECK: No JVD; No carotid bruits LYMPHATICS: No lymphadenopathy CARDIAC: RRR, no murmurs, no rubs, no gallops RESPIRATORY:  Clear to auscultation without rales, wheezing or rhonchi  ABDOMEN: Soft, non-tender, non-distended MUSCULOSKELETAL:  No edema; No deformity  SKIN: Warm and dry NEUROLOGIC:  Alert and oriented x 3 PSYCHIATRIC:  Normal affect   ASSESSMENT:    1. Atypical chest pain   2. Hyperlipidemia, mixed   3. Essential hypertension   4. Obesity due to excess calories, unspecified classification, unspecified whether serious comorbidity present    PLAN:    In order  of problems listed above:  Atypical chest pain at this lady with multiple risk factors for coronary artery disease.  We did talk in length about options for the situation I think the best option will be to perform coronary CT angio that she is to see if she has any coronary artery disease.  In the meantime she is already taking antiplatelet therapy which I will continue, she is on beta-blocker which I will continue on statin as well however I will increase the dose of the statin. Diabetes which she used to be very poorly controlled but now with latest hemoglobin A1c 6.5 good control continue present management. Dyslipidemia her cholesterol is absolutely unacceptable.  She is taking only 5 mg rosuvastatin she said she does not remember any problem with higher dosages of this medication, therefore, I will increase rosuvastatin to 20 mg daily fasting lipid profile need to be done within the next few weeks.   Medication Adjustments/Labs and Tests Ordered: Current medicines are reviewed at length with the patient today.  Concerns regarding medicines are outlined above.  No orders of the defined types were placed in this encounter.  No orders of the defined types were placed in this encounter.   Signed, Park Liter, MD, [facility]. 10/07/2021 2:13 PM    [facility]

## 2021-10-07 NOTE — Addendum Note (Signed)
Addended by: Juventino Slovak on: 10/07/2021 02:22 PM   Modules accepted: Orders

## 2021-10-08 ENCOUNTER — Other Ambulatory Visit (HOSPITAL_BASED_OUTPATIENT_CLINIC_OR_DEPARTMENT_OTHER): Payer: Self-pay

## 2021-10-08 LAB — COMPREHENSIVE METABOLIC PANEL
ALT: 15 U/L (ref 0–35)
AST: 16 U/L (ref 0–37)
Albumin: 4 g/dL (ref 3.5–5.2)
Alkaline Phosphatase: 78 U/L (ref 39–117)
BUN: 13 mg/dL (ref 6–23)
CO2: 33 mEq/L — ABNORMAL HIGH (ref 19–32)
Calcium: 9.3 mg/dL (ref 8.4–10.5)
Chloride: 102 mEq/L (ref 96–112)
Creatinine, Ser: 0.77 mg/dL (ref 0.40–1.20)
GFR: 84.72 mL/min (ref >60.00)
Glucose, Bld: 63 mg/dL — ABNORMAL LOW (ref 70–99)
Potassium: 4.1 mEq/L (ref 3.5–5.1)
Sodium: 142 mEq/L (ref 135–145)
Total Bilirubin: 0.4 mg/dL (ref 0.2–1.2)
Total Protein: 6.9 g/dL (ref 6.0–8.3)

## 2021-10-08 LAB — LIPID PANEL
Cholesterol: 174 mg/dL (ref 0–200)
HDL: 39.7 mg/dL (ref >39.00)
LDL Cholesterol: 110 mg/dL — ABNORMAL HIGH (ref 0–99)
NonHDL: 134.37
Total CHOL/HDL Ratio: 4
Triglycerides: 123 mg/dL (ref 0.0–149.0)
VLDL: 24.6 mg/dL (ref 0.0–40.0)

## 2021-10-08 LAB — CBC WITH DIFFERENTIAL/PLATELET
Basophils Absolute: 0.1 10*3/uL (ref 0.0–0.1)
Basophils Relative: 1 % (ref 0.0–3.0)
Eosinophils Absolute: 0.2 10*3/uL (ref 0.0–0.7)
Eosinophils Relative: 2.3 % (ref 0.0–5.0)
HCT: 40.1 % (ref 36.0–46.0)
Hemoglobin: 12.9 g/dL (ref 12.0–15.0)
Lymphocytes Relative: 21.3 % (ref 12.0–46.0)
Lymphs Abs: 2.3 10*3/uL (ref 0.7–4.0)
MCHC: 32.1 g/dL (ref 30.0–36.0)
MCV: 89 fl (ref 78.0–100.0)
Monocytes Absolute: 0.5 10*3/uL (ref 0.1–1.0)
Monocytes Relative: 4.7 % (ref 3.0–12.0)
Neutro Abs: 7.6 10*3/uL (ref 1.4–7.7)
Neutrophils Relative %: 70.7 % (ref 43.0–77.0)
Platelets: 508 10*3/uL — ABNORMAL HIGH (ref 150.0–400.0)
RBC: 4.51 Mil/uL (ref 3.87–5.11)
RDW: 14.1 % (ref 11.5–15.5)
WBC: 10.7 10*3/uL — ABNORMAL HIGH (ref 4.0–10.5)

## 2021-10-08 LAB — TSH: TSH: 1.22 u[IU]/mL (ref 0.35–5.50)

## 2021-10-08 MED ORDER — PFIZER COVID-19 VAC BIVALENT 30 MCG/0.3ML IM SUSP
INTRAMUSCULAR | 0 refills | Status: DC
  Filled 2021-10-08: qty 0.3, 1d supply, fill #0

## 2021-10-08 NOTE — Progress Notes (Signed)
° °  Covid-19 Vaccination Clinic  Name:  Eileen Richards    MRN: [id number] DOB: Jun 13, 1961  10/08/2021  [name] was observed post Covid-19 immunization for 15 minutes without incident. She was provided with Vaccine Information Sheet and instruction to access the V-Safe system.   [name] was instructed to call 911 with any severe reactions post vaccine: Difficulty breathing  Swelling of face and throat  A fast heartbeat  A bad rash all over body  Dizziness and weakness   Immunizations Administered     Name Date Dose VIS Date Route   Pfizer Covid-19 Vaccine Bivalent Booster 10/07/2021  1:18 PM 0.3 mL 05/19/2021 Intramuscular   Manufacturer: [place]   Lot: [id number]   [place]: [phone]

## 2021-10-12 ENCOUNTER — Other Ambulatory Visit: Payer: Self-pay

## 2021-10-12 DIAGNOSIS — D72829 Elevated white blood cell count, unspecified: Secondary | ICD-10-CM

## 2021-10-14 ENCOUNTER — Other Ambulatory Visit (HOSPITAL_COMMUNITY): Payer: Self-pay

## 2021-10-14 DIAGNOSIS — Z9079 Acquired absence of other genital organ(s): Secondary | ICD-10-CM | POA: Diagnosis not present

## 2021-10-14 DIAGNOSIS — Z90710 Acquired absence of both cervix and uterus: Secondary | ICD-10-CM | POA: Diagnosis not present

## 2021-10-14 DIAGNOSIS — Z01419 Encounter for gynecological examination (general) (routine) without abnormal findings: Secondary | ICD-10-CM | POA: Diagnosis not present

## 2021-10-15 ENCOUNTER — Telehealth (HOSPITAL_COMMUNITY): Payer: Self-pay | Admitting: *Deleted

## 2021-10-15 NOTE — Telephone Encounter (Signed)
Reaching out to patient to offer assistance regarding upcoming cardiac imaging study; pt verbalizes understanding of appt date/time. Since patient was at work at [employer], Instructions review with patient in person. Patient has a physical copy of instructions.  She is aware to arrive at 7:15am for her 7:45am scan and will take 100mg  metoprolol tartrate two hours prior to cardiac CT scan.   Gordy Clement RN Navigator Cardiac Imaging [facility] [phone] office [phone] cell

## 2021-10-18 ENCOUNTER — Other Ambulatory Visit: Payer: Self-pay

## 2021-10-18 ENCOUNTER — Ambulatory Visit (HOSPITAL_COMMUNITY)
Admission: RE | Admit: 2021-10-18 | Discharge: 2021-10-18 | Disposition: A | Discharge status: Home / Self Care | Payer: 59 | Source: Ambulatory Visit | Attending: Cardiology | Admitting: Cardiology

## 2021-10-18 ENCOUNTER — Encounter (HOSPITAL_COMMUNITY): Payer: Self-pay

## 2021-10-18 DIAGNOSIS — E782 Mixed hyperlipidemia: Secondary | ICD-10-CM | POA: Insufficient documentation

## 2021-10-18 DIAGNOSIS — R0789 Other chest pain: Secondary | ICD-10-CM | POA: Insufficient documentation

## 2021-10-18 DIAGNOSIS — I10 Essential (primary) hypertension: Secondary | ICD-10-CM | POA: Insufficient documentation

## 2021-10-18 DIAGNOSIS — E6609 Other obesity due to excess calories: Secondary | ICD-10-CM | POA: Insufficient documentation

## 2021-10-18 MED ORDER — METOPROLOL TARTRATE 5 MG/5ML IV SOLN
INTRAVENOUS | Status: AC
  Administered 2021-10-18: 10 mg via INTRAVENOUS
  Filled 2021-10-18: qty 10

## 2021-10-18 MED ORDER — NITROGLYCERIN 0.4 MG SL SUBL
SUBLINGUAL_TABLET | SUBLINGUAL | Status: AC
  Filled 2021-10-18: qty 2

## 2021-10-18 MED ORDER — DILTIAZEM HCL 25 MG/5ML IV SOLN: 10.0000 mg | Freq: Once | INTRAVENOUS | Status: AC

## 2021-10-18 MED ORDER — METOPROLOL TARTRATE 5 MG/5ML IV SOLN
10.0000 mg | INTRAVENOUS | Status: AC | PRN
  Administered 2021-10-18: 10 mg via INTRAVENOUS

## 2021-10-18 MED ORDER — IOHEXOL 350 MG/ML SOLN
95.0000 mL | Freq: Once | INTRAVENOUS | Status: AC | PRN
  Administered 2021-10-18: 95 mL via INTRAVENOUS

## 2021-10-18 MED ORDER — DILTIAZEM HCL 25 MG/5ML IV SOLN
INTRAVENOUS | Status: AC
  Administered 2021-10-18: 10 mg via INTRAVENOUS
  Filled 2021-10-18: qty 5

## 2021-10-18 MED ORDER — NITROGLYCERIN 0.4 MG SL SUBL: 0.8000 mg | SUBLINGUAL_TABLET | Freq: Once | SUBLINGUAL | Status: DC

## 2021-10-18 MED ORDER — METOPROLOL TARTRATE 5 MG/5ML IV SOLN
INTRAVENOUS | Status: AC
  Filled 2021-10-18: qty 10

## 2021-10-20 ENCOUNTER — Other Ambulatory Visit: Payer: Self-pay

## 2021-10-20 ENCOUNTER — Other Ambulatory Visit (HOSPITAL_COMMUNITY): Payer: Self-pay

## 2021-10-20 ENCOUNTER — Ambulatory Visit: Payer: 59 | Admitting: Endocrinology

## 2021-10-20 VITALS — BP 160/84 | HR 93 | Ht 64.0 in | Wt 239.4 lb

## 2021-10-20 DIAGNOSIS — E669 Obesity, unspecified: Secondary | ICD-10-CM

## 2021-10-20 DIAGNOSIS — E1169 Type 2 diabetes mellitus with other specified complication: Secondary | ICD-10-CM

## 2021-10-20 LAB — POCT GLYCOSYLATED HEMOGLOBIN (HGB A1C): Hemoglobin A1C: 6.6 % — AB (ref 4.0–5.6)

## 2021-10-20 MED ORDER — METFORMIN HCL ER 500 MG PO TB24
2000.0000 mg | ORAL_TABLET | Freq: Every day | ORAL | 3 refills | Status: DC
  Filled 2021-10-20 – 2022-01-26 (×2): qty 360, 90d supply, fill #0
  Filled 2022-05-10: qty 360, 90d supply, fill #1
  Filled 2022-08-12: qty 360, 90d supply, fill #2

## 2021-10-20 MED ORDER — INSULIN GLARGINE-YFGN 100 UNIT/ML ~~LOC~~ SOPN
52.0000 [IU] | PEN_INJECTOR | SUBCUTANEOUS | 3 refills | Status: DC
Start: 2021-10-20 — End: 2022-01-17
  Filled 2021-10-20: qty 60, 115d supply, fill #0

## 2021-10-20 NOTE — Progress Notes (Signed)
Subjective:    Patient ID: Eileen Richards, female    DOB: 18-Oct-1960, 61 y.o.   MRN: [id number]  HPI Pt returns for f/u of diabetes mellitus: DM type: Insulin-requiring type 2 Dx'ed: 0973 Complications: none Therapy: insulin since 2018, Ozempic, and metformin.    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   Other: she takes QD insulin, at least for now; she did not tolerate Farxiga (urticaria).   Interval history: we are unable to download continuous glucose monitor data.  Pt says glucose varies from 65-200.  She has mild hypoglycemia almost qd.  This happens if a meal is delayed.  Pt says she stopped all of her meds x a few days, due to a CT scan.   Past Medical History:  Diagnosis Date   Achilles tendonitis 10/14/2012   Asthma    childhood   Atypical chest pain    normal stress echo 02/2009 -LV size was normal LV global systolic function was normal- Normal wall motion; no LV regional wall motion abnormalities   Benign paroxysmal positional vertigo 04/26/2015   Chest pain, atypical 08/24/2013   Diabetes mellitus    GERD (gastroesophageal reflux disease)    Hand pain, left 11/10/2015   Hyperlipemia    Hypertension    Neuropathic pain of ankle, left 04/27/2017   Onychomycosis 04/17/2014   Pain of right heel 10/14/2012   Preventative health care 11/22/2015   Sinusitis, acute 01/17/2016   Thoracic outlet syndrome 11/03/2014   Urinary urgency 12/15/2015    Past Surgical History:  Procedure Laterality Date   ABDOMINAL HYSTERECTOMY     CYSTOSCOPY  07/23/2012   Procedure: CYSTOSCOPY;  Surgeon: Marvene Staff, MD;  Location: [facility];  Service: Gynecology;  Laterality: N/A;   DILATION AND CURETTAGE OF UTERUS     Lysis of Adhesions   ROBOTIC ASSISTED LAPAROSCOPIC LYSIS OF ADHESION  07/23/2012   Procedure: ROBOTIC ASSISTED LAPAROSCOPIC LYSIS OF  ADHESION;  Surgeon: Marvene Staff, MD;  Location: [facility];  Service: Gynecology;  Laterality: N/A;   ROBOTIC ASSISTED SALPINGO OOPHERECTOMY  07/23/2012   Procedure: ROBOTIC ASSISTED SALPINGO OOPHERECTOMY;  Surgeon: Marvene Staff, MD;  Location: [facility];  Service: Gynecology;  Laterality: Bilateral;    Social History   Socioeconomic History   Marital status: Single    Spouse name: Not on file   Number of children: Not on file   Years of education: Not on file   Highest education level: Not on file  Occupational History   Not on file  Tobacco Use   Smoking status: Former   Smokeless tobacco: Never  Vaping Use   Vaping Use: Never used  Substance and Sexual Activity   Alcohol use: No   Drug use: No   Sexual activity: Not on file  Other Topics Concern   Not on file  Social History Narrative   Not on file   Social Determinants of Health   Financial Resource Strain: Not on file  Food Insecurity: Not on file  Transportation Needs: Not on file  Physical Activity: Not on file  Stress: Not on file  Social Connections: Not on file  Intimate Partner Violence: Not on file    Current Outpatient Medications on File Prior to Visit  Medication Sig Dispense Refill   Accu-Chek FastClix Lancets MISC USE AS DIRECTED TWICE DAILY TO CHECK BLOOD SUGAR. DX E11.9 (Patient taking differently:  1 each by Other route See admin instructions. Use as directed twice daily to check blood sugar.  DX E11.9) 102 each 6   aspirin 81 MG tablet Take 81 mg by mouth daily.     blood glucose meter kit and supplies KIT Dispense based on patient and insurance preference. Use up to four times daily as directed. (FOR ICD-9 250.00, 250.01). (Patient taking differently: Inject 1 each into the skin as directed. Dispense based on patient and insurance preference. Use up to four times daily as directed. (FOR ICD-9 250.00, 250.01).) 1 each 0   Blood Glucose Monitoring Suppl (FREESTYLE FREEDOM LITE) w/Device KIT Use  to check sugar twice a day.  Dx Code: E11.9 (Patient taking differently: 1 each by Other route See admin instructions. Use to check sugar twice a day.  Dx Code: E11.9) 1 kit 0   carvedilol (COREG) 3.125 MG tablet Take 1 tablet (3.125 mg total) by mouth 2 (two) times daily with a meal 180 tablet 1   cetirizine (ZYRTEC) 10 MG tablet TAKE 1 TABLET BY MOUTH 2 TIMES DAILY. (Patient taking differently: Take 10 mg by mouth 2 (two) times daily.) 200 tablet 1   Continuous Blood Gluc Receiver (FREESTYLE LIBRE 2 READER) DEVI USE AS DIRECTED. (Patient taking differently: 1 each by Other route See admin instructions. Glucose reading) 1 each 1   Continuous Blood Gluc Sensor (FREESTYLE LIBRE 2 SENSOR) MISC USE AS DIRECTED EVERY 14 DAYS. (Patient taking differently: 1 each by Other route See admin instructions. Glucose reading) 6 each 3   COVID-19 mRNA bivalent vaccine, Pfizer, (PFIZER COVID-19 VAC BIVALENT) injection Inject into the muscle. 0.3 mL 0   diclofenac Sodium (VOLTAREN) 1 % GEL Apply 2 g topically 4 (four) times daily. Rub into affected area of foot 2 to 4 times daily (Patient taking differently: Apply 2 g topically as needed (Joint pain). Rub into affected area of foot 2 to 4 times daily) 100 g 2   famotidine (PEPCID) 20 MG tablet TAKE 1 TABLET (20 MG TOTAL) BY MOUTH 2 (TWO) TIMES DAILY. (Patient taking differently: Take 20 mg by mouth 2 (two) times daily.) 180 tablet 3   gabapentin (NEURONTIN) 300 MG capsule Take 1 capsule (300 mg total) by mouth 3 (three) times daily. (Patient taking differently: Take 300 mg by mouth as needed (Nerve pain).) 90 capsule 1   hydrochlorothiazide (HYDRODIURIL) 25 MG tablet Take 1 tablet (25 mg total) by mouth daily. 90 tablet 1   meclizine (ANTIVERT) 25 MG tablet TAKE 1 TABLET (25 MG TOTAL) BY MOUTH 3 (THREE) TIMES DAILY AS NEEDED FOR DIZZINESS. (Patient taking differently: Take 25 mg by mouth 3 (three) times daily as needed for dizziness.) 30 tablet 0   meloxicam (MOBIC) 15 MG  tablet Take 1 tablet (15 mg total) by mouth daily. 30 tablet 0   nitroGLYCERIN (NITROSTAT) 0.4 MG SL tablet Place 1 tablet (0.4 mg total) under the tongue every 5 (five) minutes x 3 doses as needed for chest pain. 40 tablet 1   pantoprazole (PROTONIX) 40 MG tablet Take 1 tablet (40 mg total) by mouth daily. 90 tablet 1   potassium chloride SA (KLOR-CON) 20 MEQ tablet Take 1 tablet (20 mEq total) by mouth daily. 90 tablet 1   promethazine (PHENERGAN) 25 MG tablet Take 1 tablet (25 mg total) by mouth every 8 (eight) hours as needed for nausea or vomiting. 20 tablet 0   rosuvastatin (CRESTOR) 20 MG tablet Take 1 tablet (20 mg total) by mouth daily.  90 tablet 3   Semaglutide, 2 MG/DOSE, (OZEMPIC, 2 MG/DOSE,) 8 MG/3ML SOPN Inject 2 mg into the skin once a week. 9 mL 3   tiZANidine (ZANAFLEX) 4 MG tablet Take 1 tablet (4 mg total) by mouth every 6 (six) hours as needed for muscle spasms. 40 tablet 1   traMADol (ULTRAM) 50 MG tablet Take 1 tablet (50 mg total) by mouth every 12 (twelve) hours as needed. (Patient taking differently: Take 50 mg by mouth every 12 (twelve) hours as needed for moderate pain or severe pain.) 60 tablet 0   albuterol (VENTOLIN HFA) 108 (90 Base) MCG/ACT inhaler INHALE 2 PUFFS INTO THE LUNGS EVERY 6 (SIX) HOURS AS NEEDED FOR WHEEZING OR SHORTNESS OF BREATH. 18 g 5   metoprolol tartrate (LOPRESSOR) 100 MG tablet Take 1 tablet (100 mg total) by mouth once for 1 dose. Please take 2 hours prior to CT. 1 tablet 0   [DISCONTINUED] insulin glargine (SEMGLEE) 100 UNIT/ML Solostar Pen Inject 55 Units into the skin every morning. And pen needles 2/day 60 mL 3   No current facility-administered medications on file prior to visit.    Allergies  Allergen Reactions   Farxiga [Dapagliflozin] Hives    Family History  Problem Relation Age of Onset   GER disease Mother    GER disease Father    Heart disease Father        father has "heart problems"   Anemia Father    Asthma Other     Hypertension Other    Diabetes Other     BP (!) 160/84    Pulse 93    Ht _0  (1.626 m)    Wt 239 lb 6.4 oz (108.6 kg)    SpO2 96%    BMI 41.09 kg/m    Review of Systems     Objective:   Physical Exam  Lab Results  Component Value Date   CREATININE 0.77 10/07/2021   BUN 13 10/07/2021   NA 142 10/07/2021   K 4.1 10/07/2021   CL 102 10/07/2021   CO2 33 (H) 10/07/2021    Lab Results  Component Value Date   HGBA1C 6.6 (A) 10/20/2021      Assessment & Plan:  Insulin-requiring type 2 DM: overcontrolled Hypoglycemia, due to insulin: this limits aggressiveness of glycemic control  Patient Instructions  check your blood sugar twice a day.  vary the time of day when you check, between before the 3 meals, and at bedtime.  also check if you have symptoms of your blood sugar being too high or too low.  please keep a record of the readings and bring it to your next appointment here (or you can bring the meter itself).  You can write it on any piece of paper.  please call us sooner if your blood sugar goes below 70, or if most of your readings are over 200.   Please reduce the Semglee to 52 units each morning, and:  continue the same other 2 diabetes medications.  You should consider using a V-GO pump or taking a shot of fast-acting insulin 3 times a day (just before each meal).  Please let us know if you decide to do 1 of these options.   Applying benzoin to the site helps the sensor from falling off.   Please come back for a follow-up appointment in 3 months.

## 2021-10-20 NOTE — Patient Instructions (Addendum)
check your blood sugar twice a day.  vary the time of day when you check, between before the 3 meals, and at bedtime.  also check if you have symptoms of your blood sugar being too high or too low.  please keep a record of the readings and bring it to your next appointment here (or you can bring the meter itself).  You can write it on any piece of paper.  please call us sooner if your blood sugar goes below 70, or if most of your readings are over 200.   Please reduce the Semglee to 52 units each morning, and:  continue the same other 2 diabetes medications.  You should consider using a V-GO pump or taking a shot of fast-acting insulin 3 times a day (just before each meal).  Please let us know if you decide to do 1 of these options.   Applying benzoin to the site helps the sensor from falling off.   Please come back for a follow-up appointment in 3 months.

## 2021-10-21 ENCOUNTER — Other Ambulatory Visit (HOSPITAL_COMMUNITY): Payer: Self-pay

## 2021-10-21 MED ORDER — INSULIN PEN NEEDLE 32G X 4 MM MISC
3 refills | Status: DC
  Filled 2021-10-21 – 2021-11-02 (×2): qty 100, 90d supply, fill #0
  Filled 2022-03-26: qty 100, 90d supply, fill #1
  Filled 2022-06-22: qty 100, 90d supply, fill #2

## 2021-10-29 ENCOUNTER — Other Ambulatory Visit (HOSPITAL_COMMUNITY): Payer: Self-pay

## 2021-11-01 MED FILL — Continuous Glucose System Sensor: 28 days supply | Qty: 2 | Fill #11 | Status: AC

## 2021-11-02 ENCOUNTER — Other Ambulatory Visit (HOSPITAL_COMMUNITY): Payer: Self-pay

## 2021-11-09 ENCOUNTER — Other Ambulatory Visit (HOSPITAL_COMMUNITY): Payer: Self-pay

## 2021-11-11 ENCOUNTER — Other Ambulatory Visit: Payer: 59

## 2021-11-29 ENCOUNTER — Other Ambulatory Visit: Payer: Self-pay | Admitting: Endocrinology

## 2021-11-30 ENCOUNTER — Other Ambulatory Visit (HOSPITAL_COMMUNITY): Payer: Self-pay

## 2021-11-30 MED ORDER — FREESTYLE LIBRE 2 SENSOR MISC
3 refills | Status: AC
  Filled 2021-11-30: qty 2, 28d supply, fill #0
  Filled 2022-01-04: qty 2, 28d supply, fill #1
  Filled 2022-01-31: qty 2, 28d supply, fill #2
  Filled 2022-03-01: qty 2, 28d supply, fill #3
  Filled 2022-03-26: qty 2, 28d supply, fill #4
  Filled 2022-04-23: qty 2, 28d supply, fill #5
  Filled 2022-05-20: qty 2, 28d supply, fill #6
  Filled 2022-06-16: qty 2, 28d supply, fill #7
  Filled 2022-07-13: qty 2, 28d supply, fill #8
  Filled 2022-08-12: qty 2, 28d supply, fill #9
  Filled 2022-09-07: qty 2, 28d supply, fill #10
  Filled 2022-10-10: qty 2, 28d supply, fill #11

## 2021-12-03 ENCOUNTER — Other Ambulatory Visit (HOSPITAL_COMMUNITY): Payer: Self-pay

## 2021-12-10 ENCOUNTER — Ambulatory Visit: Payer: 59 | Admitting: Cardiology

## 2021-12-23 ENCOUNTER — Ambulatory Visit: Payer: 59 | Admitting: Cardiology

## 2022-01-05 ENCOUNTER — Other Ambulatory Visit (HOSPITAL_COMMUNITY): Payer: Self-pay

## 2022-01-17 ENCOUNTER — Other Ambulatory Visit (HOSPITAL_COMMUNITY): Payer: Self-pay

## 2022-01-17 ENCOUNTER — Ambulatory Visit: Payer: 59 | Admitting: Endocrinology

## 2022-01-17 ENCOUNTER — Encounter: Payer: Self-pay | Admitting: Endocrinology

## 2022-01-17 ENCOUNTER — Telehealth: Payer: Self-pay | Admitting: Family Medicine

## 2022-01-17 VITALS — BP 116/74 | HR 94 | Ht 64.0 in | Wt 236.8 lb

## 2022-01-17 DIAGNOSIS — E669 Obesity, unspecified: Secondary | ICD-10-CM

## 2022-01-17 DIAGNOSIS — E1169 Type 2 diabetes mellitus with other specified complication: Secondary | ICD-10-CM | POA: Diagnosis not present

## 2022-01-17 LAB — POCT GLYCOSYLATED HEMOGLOBIN (HGB A1C): Hemoglobin A1C: 6.6 % — AB (ref 4.0–5.6)

## 2022-01-17 MED ORDER — OZEMPIC (2 MG/DOSE) 8 MG/3ML ~~LOC~~ SOPN
2.0000 mg | PEN_INJECTOR | SUBCUTANEOUS | 1 refills | Status: DC
  Filled 2022-01-17: qty 9, 84d supply, fill #0

## 2022-01-17 MED ORDER — INSULIN GLARGINE-YFGN 100 UNIT/ML ~~LOC~~ SOPN
48.0000 [IU] | PEN_INJECTOR | SUBCUTANEOUS | 1 refills | Status: DC
Start: 2022-01-17 — End: 2022-09-07
  Filled 2022-01-17: qty 42, 88d supply, fill #0
  Filled 2022-03-21 – 2022-03-24 (×2): qty 42, 88d supply, fill #1
  Filled 2022-03-29: qty 15, 31d supply, fill #1
  Filled 2022-05-10: qty 15, 31d supply, fill #2
  Filled 2022-05-24: qty 42, 87d supply, fill #3
  Filled 2022-09-07: qty 42, 87d supply, fill #4

## 2022-01-17 NOTE — Progress Notes (Signed)
? ?Subjective:  ? ? Patient ID: Eileen Richards, female    DOB: 09-Aug-1961, 61 y.o.   MRN: [id number] ? ?HPI ?Pt returns for f/u of diabetes mellitus: ?DM type: Insulin-requiring type 2 ?Dx'ed: 2009 ?Complications: none ?Therapy: insulin since 2018, Ozempic, and metformin.    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   ?Other: she takes QD insulin, at least for now; she did not tolerate Farxiga (urticaria).   ?Interval history: I reviewed continuous glucose monitor data.  Glucose varies from 80-200.  It is in general highest at 10PM, and less high at [facility] and 7AM.  It is lowest at 3AM.  It decreases slightly overnight.  She still has mild hypoglycemia almost qd.  This happens in the middle of the night. She takes meds as rx'ed.   ?Past Medical History:  ?Diagnosis Date  ? Achilles tendonitis 10/14/2012  ? Asthma   ? childhood  ? Atypical chest pain   ? normal stress echo 02/2009 -LV size was normal LV global systolic function was normal- Normal wall motion; no LV regional wall motion abnormalities  ? Benign paroxysmal positional vertigo 04/26/2015  ? Chest pain, atypical 08/24/2013  ? Diabetes mellitus   ? GERD (gastroesophageal reflux disease)   ? Hand pain, left 11/10/2015  ? Hyperlipemia   ? Hypertension   ? Neuropathic pain of ankle, left 04/27/2017  ? Onychomycosis 04/17/2014  ? Pain of right heel 10/14/2012  ? Preventative health care 11/22/2015  ? Sinusitis, acute 01/17/2016  ? Thoracic outlet syndrome 11/03/2014  ? Urinary urgency 12/15/2015  ? ? ?Past Surgical History:  ?Procedure Laterality Date  ? ABDOMINAL HYSTERECTOMY    ? CYSTOSCOPY  07/23/2012  ? Procedure: CYSTOSCOPY;  Surgeon: Marvene Staff, MD;  Location: [facility];  Service: Gynecology;  Laterality: N/A;  ? DILATION AND CURETTAGE OF UTERUS    ? Lysis of Adhesions  ? ROBOTIC ASSISTED LAPAROSCOPIC LYSIS OF ADHESION   07/23/2012  ? Procedure: ROBOTIC ASSISTED LAPAROSCOPIC LYSIS OF ADHESION;  Surgeon: Marvene Staff, MD;  Location: [facility];  Service: Gynecology;  Laterality: N/A;  ? ROBOTIC ASSISTED SALPINGO OOPHERECTOMY  07/23/2012  ? Procedure: ROBOTIC ASSISTED SALPINGO OOPHERECTOMY;  Surgeon: Marvene Staff, MD;  Location: [facility];  Service: Gynecology;  Laterality: Bilateral;  ? ? ?Social History  ? ?Socioeconomic History  ? Marital status: Single  ?  Spouse name: Not on file  ? Number of children: Not on file  ? Years of education: Not on file  ? Highest education level: Not on file  ?Occupational History  ? Not on file  ?Tobacco Use  ? Smoking status: Former  ? Smokeless tobacco: Never  ?Vaping Use  ? Vaping Use: Never used  ?Substance and Sexual Activity  ? Alcohol use: No  ? Drug use: No  ? Sexual activity: Not on file  ?Other Topics Concern  ? Not on file  ?Social History Narrative  ? Not on file  ? ?Social Determinants of Health  ? ?Financial Resource Strain: Not on file  ?Food Insecurity: Not on file  ?Transportation Needs: Not on file  ?Physical Activity: Not on file  ?Stress: Not on file  ?Social Connections: Not on file  ?Intimate Partner Violence: Not on file  ? ? ?Current Outpatient Medications on File Prior to Visit  ?Medication Sig Dispense Refill  ? Accu-Chek FastClix Lancets MISC USE AS DIRECTED TWICE  DAILY TO CHECK BLOOD SUGAR. DX E11.9 (Patient taking differently: 1 each by Other route See admin instructions. Use as directed twice daily to check blood sugar.  DX E11.9) 102 each 6  ? aspirin 81 MG tablet Take 81 mg by mouth daily.    ? blood glucose meter kit and supplies KIT Dispense based on patient and insurance preference. Use up to four times daily as directed. (FOR ICD-9 250.00, 250.01). (Patient taking differently: Inject 1 each into the skin as directed. Dispense based on patient and insurance preference. Use up to four times daily as directed. (FOR ICD-9 250.00, 250.01).) 1 each 0  ? Blood  Glucose Monitoring Suppl (FREESTYLE FREEDOM LITE) w/Device KIT Use to check sugar twice a day.  Dx Code: E11.9 (Patient taking differently: 1 each by Other route See admin instructions. Use to check sugar twice a day.  Dx Code: E11.9) 1 kit 0  ? carvedilol (COREG) 3.125 MG tablet Take 1 tablet (3.125 mg total) by mouth 2 (two) times daily with a meal 180 tablet 1  ? cetirizine (ZYRTEC) 10 MG tablet TAKE 1 TABLET BY MOUTH 2 TIMES DAILY. (Patient taking differently: Take 10 mg by mouth 2 (two) times daily.) 200 tablet 1  ? Continuous Blood Gluc Receiver (FREESTYLE LIBRE 2 READER) DEVI USE AS DIRECTED. (Patient taking differently: 1 each by Other route See admin instructions. Glucose reading) 1 each 1  ? Continuous Blood Gluc Sensor (FREESTYLE LIBRE 2 SENSOR) MISC USE AS DIRECTED EVERY 14 DAYS. 6 each 3  ? COVID-19 mRNA bivalent vaccine, Pfizer, (PFIZER COVID-19 VAC BIVALENT) injection Inject into the muscle. 0.3 mL 0  ? diclofenac Sodium (VOLTAREN) 1 % GEL Apply 2 g topically 4 (four) times daily. Rub into affected area of foot 2 to 4 times daily (Patient taking differently: Apply 2 g topically as needed (Joint pain). Rub into affected area of foot 2 to 4 times daily) 100 g 2  ? gabapentin (NEURONTIN) 300 MG capsule Take 1 capsule (300 mg total) by mouth 3 (three) times daily. (Patient taking differently: Take 300 mg by mouth as needed (Nerve pain).) 90 capsule 1  ? hydrochlorothiazide (HYDRODIURIL) 25 MG tablet Take 1 tablet (25 mg total) by mouth daily. 90 tablet 1  ? Insulin Pen Needle 32G X 4 MM MISC Use once daily 100 each 3  ? meloxicam (MOBIC) 15 MG tablet Take 1 tablet (15 mg total) by mouth daily. 30 tablet 0  ? metFORMIN (GLUCOPHAGE-XR) 500 MG 24 hr tablet Take 4 tablets (2,000 mg total) by mouth daily. 360 tablet 3  ? nitroGLYCERIN (NITROSTAT) 0.4 MG SL tablet Place 1 tablet (0.4 mg total) under the tongue every 5 (five) minutes x 3 doses as needed for chest pain. 40 tablet 1  ? pantoprazole (PROTONIX) 40  MG tablet Take 1 tablet (40 mg total) by mouth daily. 90 tablet 1  ? potassium chloride SA (KLOR-CON M) 20 MEQ tablet Take 1 tablet (20 mEq total) by mouth daily. 90 tablet 1  ? promethazine (PHENERGAN) 25 MG tablet Take 1 tablet (25 mg total) by mouth every 8 (eight) hours as needed for nausea or vomiting. 20 tablet 0  ? rosuvastatin (CRESTOR) 20 MG tablet Take 1 tablet (20 mg total) by mouth daily. 90 tablet 3  ? tiZANidine (ZANAFLEX) 4 MG tablet Take 1 tablet (4 mg total) by mouth every 6 (six) hours as needed for muscle spasms. 40 tablet 1  ? traMADol (ULTRAM) 50 MG tablet Take 1 tablet (50  mg total) by mouth every 12 (twelve) hours as needed. (Patient taking differently: Take 50 mg by mouth every 12 (twelve) hours as needed for moderate pain or severe pain.) 60 tablet 0  ? albuterol (VENTOLIN HFA) 108 (90 Base) MCG/ACT inhaler INHALE 2 PUFFS INTO THE LUNGS EVERY 6 (SIX) HOURS AS NEEDED FOR WHEEZING OR SHORTNESS OF BREATH. 18 g 5  ? famotidine (PEPCID) 20 MG tablet TAKE 1 TABLET (20 MG TOTAL) BY MOUTH 2 (TWO) TIMES DAILY. (Patient taking differently: Take 20 mg by mouth 2 (two) times daily.) 180 tablet 3  ? metoprolol tartrate (LOPRESSOR) 100 MG tablet Take 1 tablet (100 mg total) by mouth once for 1 dose. Please take 2 hours prior to CT. 1 tablet 0  ? [DISCONTINUED] insulin glargine (SEMGLEE) 100 UNIT/ML Solostar Pen Inject 55 Units into the skin every morning. And pen needles 2/day 60 mL 3  ? ?No current facility-administered medications on file prior to visit.  ? ? ?Allergies  ?Allergen Reactions  ? Wilder Glade [Dapagliflozin] Hives  ? ? ? ? ?BP 116/74 (BP Location: Left Arm, Patient Position: Sitting, Cuff Size: Normal)   Pulse 94   Ht $R'5\' 4"'nx$  (1.626 m)   Wt 236 lb 12.8 oz (107.4 kg)   SpO2 99%   BMI 40.65 kg/m?  ? ? ?Review of Systems ?Denies N/HB ?   ?Objective:  ? Physical Exam ?VITAL SIGNS:  See vs page ?GENERAL: no distress ? ? ?Lab Results  ?Component Value Date  ? HGBA1C 6.6 (A) 01/17/2022  ? ?    ?Assessment & Plan:  ?Insulin-requiring type 2 DM: overcontrolled ?Hypoglycemia, due to insulin: this limits aggressiveness of glycemic control.   ? ?Patient Instructions  ?check your blood sugar twice a day.  vary

## 2022-01-17 NOTE — Telephone Encounter (Signed)
Forms faxed into front office ? ?Placed in bin up front  ?

## 2022-01-17 NOTE — Patient Instructions (Addendum)
check your blood sugar twice a day.  vary the time of day when you check, between before the 3 meals, and at bedtime.  also check if you have symptoms of your blood sugar being too high or too low.  please keep a record of the readings and bring it to your next appointment here (or you can bring the meter itself).  You can write it on any piece of paper.  please call us sooner if your blood sugar goes below 70, or if most of your readings are over 200.   ?Please reduce the Semglee to 48 units each morning, and:  ?continue the same other 2 diabetes medications.  ?You should have an endocrinology follow-up appointment in 3 months.   ? ?

## 2022-01-17 NOTE — Telephone Encounter (Signed)
Forms received.  

## 2022-01-18 ENCOUNTER — Other Ambulatory Visit (HOSPITAL_COMMUNITY): Payer: Self-pay

## 2022-01-19 NOTE — Telephone Encounter (Signed)
Forms completed and faxed.  

## 2022-01-26 ENCOUNTER — Other Ambulatory Visit: Payer: Self-pay | Admitting: Family Medicine

## 2022-01-27 ENCOUNTER — Other Ambulatory Visit (HOSPITAL_COMMUNITY): Payer: Self-pay

## 2022-01-27 MED ORDER — FAMOTIDINE 20 MG PO TABS
20.0000 mg | ORAL_TABLET | Freq: Two times a day (BID) | ORAL | 3 refills | Status: DC
  Filled 2022-01-27: qty 180, 90d supply, fill #0
  Filled 2022-05-10: qty 78, 39d supply, fill #1
  Filled 2022-05-11: qty 102, 51d supply, fill #1
  Filled 2022-08-12: qty 180, 90d supply, fill #2
  Filled 2022-11-15: qty 180, 90d supply, fill #3

## 2022-01-27 MED ORDER — POTASSIUM CHLORIDE CRYS ER 20 MEQ PO TBCR
20.0000 meq | EXTENDED_RELEASE_TABLET | Freq: Every day | ORAL | 1 refills | Status: DC
  Filled 2022-01-27: qty 90, 90d supply, fill #0
  Filled 2022-05-10: qty 90, 90d supply, fill #1

## 2022-01-27 MED ORDER — HYDROCHLOROTHIAZIDE 25 MG PO TABS
25.0000 mg | ORAL_TABLET | Freq: Every day | ORAL | 1 refills | Status: DC
  Filled 2022-01-27: qty 90, 90d supply, fill #0
  Filled 2022-05-10: qty 90, 90d supply, fill #1

## 2022-01-27 MED ORDER — CARVEDILOL 3.125 MG PO TABS
3.1250 mg | ORAL_TABLET | Freq: Two times a day (BID) | ORAL | 1 refills | Status: DC
  Filled 2022-01-27: qty 180, 90d supply, fill #0
  Filled 2022-05-10: qty 180, 90d supply, fill #1

## 2022-01-27 MED ORDER — PANTOPRAZOLE SODIUM 40 MG PO TBEC
40.0000 mg | DELAYED_RELEASE_TABLET | Freq: Every day | ORAL | 1 refills | Status: DC
  Filled 2022-01-27: qty 90, 90d supply, fill #0
  Filled 2022-05-10: qty 90, 90d supply, fill #1

## 2022-01-31 ENCOUNTER — Other Ambulatory Visit (HOSPITAL_COMMUNITY): Payer: Self-pay

## 2022-03-01 ENCOUNTER — Other Ambulatory Visit: Payer: Self-pay | Admitting: Family Medicine

## 2022-03-01 ENCOUNTER — Other Ambulatory Visit (HOSPITAL_COMMUNITY): Payer: Self-pay

## 2022-03-01 ENCOUNTER — Other Ambulatory Visit: Payer: Self-pay | Admitting: Cardiology

## 2022-03-01 MED ORDER — TRAMADOL HCL 50 MG PO TABS
50.0000 mg | ORAL_TABLET | Freq: Two times a day (BID) | ORAL | 0 refills | Status: DC | PRN
Start: 2022-03-01 — End: 2022-03-08
  Filled 2022-03-01: qty 60, 30d supply, fill #0

## 2022-03-01 NOTE — Telephone Encounter (Signed)
Requesting: tramadol '50mg'$   Contract: 2/81/1886 UDS: 10/27/2014 Last Visit: 09/07/21 Next Visit: 03/08/22 Last Refill: 12/27/19 #60 and 0RF  Please Advise

## 2022-03-07 NOTE — Progress Notes (Unsigned)
Subjective:    Patient ID: Eileen Richards, female    DOB: 08-Mar-1961, 61 y.o.   MRN: [id number]  No chief complaint on file.   HPI Patient is in today for a follow up.  Past Medical History:  Diagnosis Date   Achilles tendonitis 10/14/2012   Asthma    childhood   Atypical chest pain    normal stress echo 02/2009 -LV size was normal LV global systolic function was normal- Normal wall motion; no LV regional wall motion abnormalities   Benign paroxysmal positional vertigo 04/26/2015   Chest pain, atypical 08/24/2013   Diabetes mellitus    GERD (gastroesophageal reflux disease)    Hand pain, left 11/10/2015   Hyperlipemia    Hypertension    Neuropathic pain of ankle, left 04/27/2017   Onychomycosis 04/17/2014   Pain of right heel 10/14/2012   Preventative health care 11/22/2015   Sinusitis, acute 01/17/2016   Thoracic outlet syndrome 11/03/2014   Urinary urgency 12/15/2015    Past Surgical History:  Procedure Laterality Date   ABDOMINAL HYSTERECTOMY     CYSTOSCOPY  07/23/2012   Procedure: CYSTOSCOPY;  Surgeon: Marvene Staff, MD;  Location: [facility];  Service: Gynecology;  Laterality: N/A;   DILATION AND CURETTAGE OF UTERUS     Lysis of Adhesions   ROBOTIC ASSISTED LAPAROSCOPIC LYSIS OF ADHESION  07/23/2012   Procedure: ROBOTIC ASSISTED LAPAROSCOPIC LYSIS OF ADHESION;  Surgeon: Marvene Staff, MD;  Location: [facility];  Service: Gynecology;  Laterality: N/A;   ROBOTIC ASSISTED SALPINGO OOPHERECTOMY  07/23/2012   Procedure: ROBOTIC ASSISTED SALPINGO OOPHERECTOMY;  Surgeon: Marvene Staff, MD;  Location: [facility];  Service: Gynecology;  Laterality: Bilateral;    Family History  Problem Relation Age of Onset   GER disease Mother    GER disease Father    Heart disease Father        father has "heart problems"   Anemia Father    Asthma Other    Hypertension Other    Diabetes Other     Social History   Socioeconomic History   Marital status: Single    Spouse name: Not  on file   Number of children: Not on file   Years of education: Not on file   Highest education level: Not on file  Occupational History   Not on file  Tobacco Use   Smoking status: Former   Smokeless tobacco: Never  Vaping Use   Vaping Use: Never used  Substance and Sexual Activity   Alcohol use: No   Drug use: No   Sexual activity: Not on file  Other Topics Concern   Not on file  Social History Narrative   Not on file   Social Determinants of Health   Financial Resource Strain: Not on file  Food Insecurity: Not on file  Transportation Needs: Not on file  Physical Activity: Not on file  Stress: Not on file  Social Connections: Not on file  Intimate Partner Violence: Not on file    Outpatient Medications Prior to Visit  Medication Sig Dispense Refill   Accu-Chek FastClix Lancets MISC USE AS DIRECTED TWICE DAILY TO CHECK BLOOD SUGAR. DX E11.9 (Patient taking differently: 1 each by Other route See admin instructions. Use as directed twice daily to check blood sugar.  DX E11.9) 102 each 6   albuterol (VENTOLIN HFA) 108 (90 Base) MCG/ACT inhaler INHALE 2 PUFFS INTO THE LUNGS EVERY 6 (SIX) HOURS AS NEEDED FOR WHEEZING OR SHORTNESS OF BREATH. 18  g 5   aspirin 81 MG tablet Take 81 mg by mouth daily.     blood glucose meter kit and supplies KIT Dispense based on patient and insurance preference. Use up to four times daily as directed. (FOR ICD-9 250.00, 250.01). (Patient taking differently: Inject 1 each into the skin as directed. Dispense based on patient and insurance preference. Use up to four times daily as directed. (FOR ICD-9 250.00, 250.01).) 1 each 0   Blood Glucose Monitoring Suppl (FREESTYLE FREEDOM LITE) w/Device KIT Use to check sugar twice a day.  Dx Code: E11.9 (Patient taking differently: 1 each by Other route See admin instructions. Use to check sugar twice a day.  Dx Code: E11.9) 1 kit 0   carvedilol (COREG) 3.125 MG tablet Take 1 tablet (3.125 mg total) by mouth 2  (two) times daily with a meal 180 tablet 1   cetirizine (ZYRTEC) 10 MG tablet TAKE 1 TABLET BY MOUTH 2 TIMES DAILY. (Patient taking differently: Take 10 mg by mouth 2 (two) times daily.) 200 tablet 1   Continuous Blood Gluc Receiver (FREESTYLE LIBRE 2 READER) DEVI USE AS DIRECTED. (Patient taking differently: 1 each by Other route See admin instructions. Glucose reading) 1 each 1   Continuous Blood Gluc Sensor (FREESTYLE LIBRE 2 SENSOR) MISC USE AS DIRECTED EVERY 14 DAYS. 6 each 3   COVID-19 mRNA bivalent vaccine, Pfizer, (PFIZER COVID-19 VAC BIVALENT) injection Inject into the muscle. 0.3 mL 0   diclofenac Sodium (VOLTAREN) 1 % GEL Apply 2 g topically 4 (four) times daily. Rub into affected area of foot 2 to 4 times daily (Patient taking differently: Apply 2 g topically as needed (Joint pain). Rub into affected area of foot 2 to 4 times daily) 100 g 2   famotidine (PEPCID) 20 MG tablet Take 1 tablet (20 mg total) by mouth 2 (two) times daily. 180 tablet 3   gabapentin (NEURONTIN) 300 MG capsule Take 1 capsule (300 mg total) by mouth 3 (three) times daily. (Patient taking differently: Take 300 mg by mouth as needed (Nerve pain).) 90 capsule 1   hydrochlorothiazide (HYDRODIURIL) 25 MG tablet Take 1 tablet (25 mg total) by mouth daily. 90 tablet 1   insulin glargine-yfgn (SEMGLEE, YFGN,) 100 UNIT/ML Pen Inject 48 Units into the skin every morning. 60 mL 1   Insulin Pen Needle 32G X 4 MM MISC Use once daily 100 each 3   meloxicam (MOBIC) 15 MG tablet Take 1 tablet (15 mg total) by mouth daily. 30 tablet 0   metFORMIN (GLUCOPHAGE-XR) 500 MG 24 hr tablet Take 4 tablets (2,000 mg total) by mouth daily. 360 tablet 3   metoprolol tartrate (LOPRESSOR) 100 MG tablet Take 1 tablet (100 mg total) by mouth once for 1 dose. Please take 2 hours prior to CT. 1 tablet 0   nitroGLYCERIN (NITROSTAT) 0.4 MG SL tablet Place 1 tablet (0.4 mg total) under the tongue every 5 (five) minutes x 3 doses as needed for chest pain.  40 tablet 1   pantoprazole (PROTONIX) 40 MG tablet Take 1 tablet (40 mg total) by mouth daily. 90 tablet 1   potassium chloride SA (KLOR-CON M) 20 MEQ tablet Take 1 tablet (20 mEq total) by mouth daily. 90 tablet 1   promethazine (PHENERGAN) 25 MG tablet Take 1 tablet (25 mg total) by mouth every 8 (eight) hours as needed for nausea or vomiting. 20 tablet 0   rosuvastatin (CRESTOR) 20 MG tablet Take 1 tablet (20 mg total) by mouth daily.  90 tablet 3   Semaglutide, 2 MG/DOSE, (OZEMPIC, 2 MG/DOSE,) 8 MG/3ML SOPN Inject 2 mg into the skin once a week. 9 mL 1   tiZANidine (ZANAFLEX) 4 MG tablet Take 1 tablet (4 mg total) by mouth every 6 (six) hours as needed for muscle spasms. 40 tablet 1   traMADol (ULTRAM) 50 MG tablet Take 1 tablet (50 mg total) by mouth every 12 (twelve) hours as needed. 60 tablet 0   No facility-administered medications prior to visit.    Allergies  Allergen Reactions   Farxiga [Dapagliflozin] Hives    ROS     Objective:    Physical Exam  There were no vitals taken for this visit. Wt Readings from Last 3 Encounters:  01/17/22 236 lb 12.8 oz (107.4 kg)  10/20/21 239 lb 6.4 oz (108.6 kg)  10/07/21 242 lb (109.8 kg)    Diabetic Foot Exam - Simple   No data filed    Lab Results  Component Value Date   WBC 10.7 (H) 10/07/2021   HGB 12.9 10/07/2021   HCT 40.1 10/07/2021   PLT 508.0 (H) 10/07/2021   GLUCOSE 63 (L) 10/07/2021   CHOL 174 10/07/2021   TRIG 123.0 10/07/2021   HDL 39.70 10/07/2021   LDLDIRECT 100.0 08/03/2017   LDLCALC 110 (H) 10/07/2021   ALT 15 10/07/2021   AST 16 10/07/2021   NA 142 10/07/2021   K 4.1 10/07/2021   CL 102 10/07/2021   CREATININE 0.77 10/07/2021   BUN 13 10/07/2021   CO2 33 (H) 10/07/2021   TSH 1.22 10/07/2021   HGBA1C 6.6 (A) 01/17/2022   MICROALBUR <0.7 01/14/2021    Lab Results  Component Value Date   TSH 1.22 10/07/2021   Lab Results  Component Value Date   WBC 10.7 (H) 10/07/2021   HGB 12.9 10/07/2021    HCT 40.1 10/07/2021   MCV 89.0 10/07/2021   PLT 508.0 (H) 10/07/2021   Lab Results  Component Value Date   NA 142 10/07/2021   K 4.1 10/07/2021   CO2 33 (H) 10/07/2021   GLUCOSE 63 (L) 10/07/2021   BUN 13 10/07/2021   CREATININE 0.77 10/07/2021   BILITOT 0.4 10/07/2021   ALKPHOS 78 10/07/2021   AST 16 10/07/2021   ALT 15 10/07/2021   PROT 6.9 10/07/2021   ALBUMIN 4.0 10/07/2021   CALCIUM 9.3 10/07/2021   GFR 84.72 10/07/2021   Lab Results  Component Value Date   CHOL 174 10/07/2021   Lab Results  Component Value Date   HDL 39.70 10/07/2021   Lab Results  Component Value Date   LDLCALC 110 (H) 10/07/2021   Lab Results  Component Value Date   TRIG 123.0 10/07/2021   Lab Results  Component Value Date   CHOLHDL 4 10/07/2021   Lab Results  Component Value Date   HGBA1C 6.6 (A) 01/17/2022       Assessment & Plan:   COLONOSCOPY: PAP: PSA: DEXA:   Problem List Items Addressed This Visit   None   I am having Pilar Jarvis maintain her aspirin, promethazine, nitroGLYCERIN, tiZANidine, Accu-Chek FastClix Lancets, FreeStyle Freedom Lite, cetirizine, gabapentin, diclofenac Sodium, blood glucose meter kit and supplies, FreeStyle Libre 2 Reader, albuterol, meloxicam, rosuvastatin, metoprolol tartrate, Pfizer COVID-19 Vac Bivalent, metFORMIN, Insulin Pen Needle, FreeStyle Libre 2 Sensor, insulin glargine-yfgn, Ozempic (2 MG/DOSE), famotidine, pantoprazole, potassium chloride SA, carvedilol, hydrochlorothiazide, and traMADol.  No orders of the defined types were placed in this encounter.

## 2022-03-08 ENCOUNTER — Other Ambulatory Visit (HOSPITAL_COMMUNITY): Payer: Self-pay

## 2022-03-08 ENCOUNTER — Ambulatory Visit: Payer: 59 | Admitting: Family Medicine

## 2022-03-08 ENCOUNTER — Encounter: Payer: Self-pay | Admitting: Family Medicine

## 2022-03-08 VITALS — BP 118/68 | HR 88 | Temp 98.0°F | Resp 20 | Ht 64.0 in | Wt 238.2 lb

## 2022-03-08 DIAGNOSIS — R7989 Other specified abnormal findings of blood chemistry: Secondary | ICD-10-CM | POA: Diagnosis not present

## 2022-03-08 DIAGNOSIS — M791 Myalgia, unspecified site: Secondary | ICD-10-CM | POA: Diagnosis not present

## 2022-03-08 DIAGNOSIS — E6609 Other obesity due to excess calories: Secondary | ICD-10-CM

## 2022-03-08 DIAGNOSIS — I10 Essential (primary) hypertension: Secondary | ICD-10-CM

## 2022-03-08 DIAGNOSIS — Z79899 Other long term (current) drug therapy: Secondary | ICD-10-CM

## 2022-03-08 DIAGNOSIS — R32 Unspecified urinary incontinence: Secondary | ICD-10-CM | POA: Diagnosis not present

## 2022-03-08 DIAGNOSIS — E782 Mixed hyperlipidemia: Secondary | ICD-10-CM | POA: Diagnosis not present

## 2022-03-08 DIAGNOSIS — E669 Obesity, unspecified: Secondary | ICD-10-CM | POA: Diagnosis not present

## 2022-03-08 DIAGNOSIS — D72829 Elevated white blood cell count, unspecified: Secondary | ICD-10-CM | POA: Diagnosis not present

## 2022-03-08 DIAGNOSIS — E1169 Type 2 diabetes mellitus with other specified complication: Secondary | ICD-10-CM

## 2022-03-08 MED ORDER — OZEMPIC (2 MG/DOSE) 8 MG/3ML ~~LOC~~ SOPN
2.0000 mg | PEN_INJECTOR | SUBCUTANEOUS | 1 refills | Status: DC
Start: 2022-03-08 — End: 2022-10-19
  Filled 2022-03-08 – 2022-03-26 (×2): qty 9, 84d supply, fill #0
  Filled 2022-06-16: qty 9, 84d supply, fill #1

## 2022-03-08 MED ORDER — TRAMADOL HCL 50 MG PO TABS
50.0000 mg | ORAL_TABLET | Freq: Three times a day (TID) | ORAL | 0 refills | Status: DC | PRN
  Filled 2022-03-08: qty 90, 30d supply, fill #0

## 2022-03-08 NOTE — Assessment & Plan Note (Signed)
Well controlled, no changes to meds. Encouraged heart healthy diet such as the DASH diet and exercise as tolerated.  °

## 2022-03-08 NOTE — Patient Instructions (Addendum)
Kegel Exercises  Kegel exercises can help strengthen your pelvic floor muscles. The pelvic floor is a group of muscles that support your rectum, small intestine, and bladder. In females, pelvic floor muscles also help support the uterus. These muscles help you control the flow of urine and stool (feces). Kegel exercises are painless and simple. They do not require any equipment. Your provider may suggest Kegel exercises to: Improve bladder and bowel control. Improve sexual response. Improve weak pelvic floor muscles after surgery to remove the uterus (hysterectomy) or after pregnancy, in females. Improve weak pelvic floor muscles after prostate gland removal or surgery, in males. Kegel exercises involve squeezing your pelvic floor muscles. These are the same muscles you squeeze when you try to stop the flow of urine or keep from passing gas. The exercises can be done while sitting, standing, or lying down, but it is best to vary your position. Ask your health care provider which exercises are safe for you. Do exercises exactly as told by your health care provider and adjust them as directed. Do not begin these exercises until told by your health care provider. Exercises How to do Kegel exercises: Squeeze your pelvic floor muscles tight. You should feel a tight lift in your rectal area. If you are a female, you should also feel a tightness in your vaginal area. Keep your stomach, buttocks, and legs relaxed. Hold the muscles tight for up to 10 seconds. Breathe normally. Relax your muscles for up to 10 seconds. Repeat as told by your health care provider. Repeat this exercise daily as told by your health care provider. Continue to do this exercise for at least 4-6 weeks, or for as long as told by your health care provider. You may be referred to a physical therapist who can help you learn more about how to do Kegel exercises. Depending on your condition, your health care provider may  recommend: Varying how long you squeeze your muscles. Doing several sets of exercises every day. Doing exercises for several weeks. Making Kegel exercises a part of your regular exercise routine. This information is not intended to replace advice given to you by your health care provider. Make sure you discuss any questions you have with your health care provider. Document Revised: 01/14/2021 Document Reviewed: 01/14/2021 Elsevier Patient Education  [employer] Injection What is this medication? SEMAGLUTIDE (SEM a GLOO tide) treats type 2 diabetes. It works by increasing insulin levels in your body, which decreases your blood sugar (glucose). It also reduces the amount of sugar released into the blood and slows down your digestion. It can also be used to lower the risk of heart attack and stroke in people with type 2 diabetes. Changes to diet and exercise are often combined with this medication. This medicine may be used for other purposes; ask your health care provider or pharmacist if you have questions. COMMON BRAND NAME(S): OZEMPIC What should I tell my care team before I take this medication? They need to know if you have any of these conditions: Endocrine tumors (MEN 2) or if someone in your family had these tumors Eye disease, vision problems History of pancreatitis Kidney disease Stomach problems Thyroid cancer or if someone in your family had thyroid cancer An unusual or allergic reaction to semaglutide, other medications, foods, dyes, or preservatives Pregnant or trying to get pregnant Breast-feeding How should I use this medication? This medication is for injection under the skin of your upper leg (thigh), stomach area, or upper arm.  It is given once every week (every 7 days). You will be taught how to prepare and give this medication. Use exactly as directed. Take your medication at regular intervals. Do not take it more often than directed. If you use this  medication with insulin, you should inject this medication and the insulin separately. Do not mix them together. Do not give the injections right next to each other. Change (rotate) injection sites with each injection. It is important that you put your used needles and syringes in a special sharps container. Do not put them in a trash can. If you do not have a sharps container, call your pharmacist or care team to get one. A special MedGuide will be given to you by the pharmacist with each prescription and refill. Be sure to read this information carefully each time. This medication comes with INSTRUCTIONS FOR USE. Ask your pharmacist for directions on how to use this medication. Read the information carefully. Talk to your pharmacist or care team if you have questions. Talk to your care team about the use of this medication in children. Special care may be needed. Overdosage: If you think you have taken too much of this medicine contact a poison control center or emergency room at once. NOTE: This medicine is only for you. Do not share this medicine with others. What if I miss a dose? If you miss a dose, take it as soon as you can within 5 days after the missed dose. Then take your next dose at your regular weekly time. If it has been longer than 5 days after the missed dose, do not take the missed dose. Take the next dose at your regular time. Do not take double or extra doses. If you have questions about a missed dose, contact your care team for advice. What may interact with this medication? Other medications for diabetes Many medications may cause changes in blood sugar, these include: Alcohol containing beverages Antiviral medications for HIV or AIDS Aspirin and aspirin-like medications Certain medications for blood pressure, heart disease, irregular heart beat Chromium Diuretics Female hormones, such as estrogens or progestins, birth control  pills Fenofibrate Gemfibrozil Isoniazid Lanreotide Female hormones or anabolic steroids MAOIs like Carbex, Eldepryl, Marplan, Nardil, and Parnate Medications for weight loss Medications for allergies, asthma, cold, or cough Medications for depression, anxiety, or psychotic disturbances Niacin Nicotine NSAIDs, medications for pain and inflammation, like ibuprofen or naproxen Octreotide Pasireotide Pentamidine Phenytoin Probenecid Quinolone antibiotics such as ciprofloxacin, levofloxacin, ofloxacin Some herbal dietary supplements Steroid medications such as prednisone or cortisone Sulfamethoxazole; trimethoprim Thyroid hormones Some medications can hide the warning symptoms of low blood sugar (hypoglycemia). You may need to monitor your blood sugar more closely if you are taking one of these medications. These include: Beta-blockers, often used for high blood pressure or heart problems (examples include atenolol, metoprolol, propranolol) Clonidine Guanethidine Reserpine This list may not describe all possible interactions. Give your health care provider a list of all the medicines, herbs, non-prescription drugs, or dietary supplements you use. Also tell them if you smoke, drink alcohol, or use illegal drugs. Some items may interact with your medicine. What should I watch for while using this medication? Visit your care team for regular checks on your progress. Drink plenty of fluids while taking this medication. Check with your care team if you get an attack of severe diarrhea, nausea, and vomiting. The loss of too much body fluid can make it dangerous for you to take this medication. A test  called the HbA1C (A1C) will be monitored. This is a simple blood test. It measures your blood sugar control over the last 2 to 3 months. You will receive this test every 3 to 6 months. Learn how to check your blood sugar. Learn the symptoms of low and high blood sugar and how to manage them. Always  carry a quick-source of sugar with you in case you have symptoms of low blood sugar. Examples include hard sugar candy or glucose tablets. Make sure others know that you can choke if you eat or drink when you develop serious symptoms of low blood sugar, such as seizures or unconsciousness. They must get medical help at once. Tell your care team if you have high blood sugar. You might need to change the dose of your medication. If you are sick or exercising more than usual, you might need to change the dose of your medication. Do not skip meals. Ask your care team if you should avoid alcohol. Many nonprescription cough and cold products contain sugar or alcohol. These can affect blood sugar. Pens should never be shared. Even if the needle is changed, sharing may result in passing of viruses like hepatitis or HIV. Wear a medical ID bracelet or chain, and carry a card that describes your disease and details of your medication and dosage times. Do not become pregnant while taking this medication. Women should inform their care team if they wish to become pregnant or think they might be pregnant. There is a potential for serious side effects to an unborn child. Talk to your care team for more information. What side effects may I notice from receiving this medication? Side effects that you should report to your care team as soon as possible: Allergic reactions--skin rash, itching, hives, swelling of the face, lips, tongue, or throat Change in vision Dehydration--increased thirst, dry mouth, feeling faint or lightheaded, headache, dark yellow or brown urine Gallbladder problems--severe stomach pain, nausea, vomiting, fever Heart palpitations--rapid, pounding, or irregular heartbeat Kidney injury--decrease in the amount of urine, swelling of the ankles, hands, or feet Pancreatitis--severe stomach pain that spreads to your back or gets worse after eating or when touched, fever, nausea, vomiting Thyroid  cancer--new mass or lump in the neck, pain or trouble swallowing, trouble breathing, hoarseness Side effects that usually do not require medical attention (report to your care team if they continue or are bothersome): Diarrhea Loss of appetite Nausea Stomach pain Vomiting This list may not describe all possible side effects. Call your doctor for medical advice about side effects. You may report side effects to FDA at 1-800-FDA-1088. Where should I keep my medication? Keep out of the reach of children. Store unopened pens in a refrigerator between 2 and 8 degrees C (36 and 46 degrees F). Do not freeze. Protect from light and heat. After you first use the pen, it can be stored for 56 days at room temperature between 15 and 30 degrees C (59 and 86 degrees F) or in a refrigerator. Throw away your used pen after 56 days or after the expiration date, whichever comes first. Do not store your pen with the needle attached. If the needle is left on, medication may leak from the pen. NOTE: This sheet is a summary. It may not cover all possible information. If you have questions about this medicine, talk to your doctor, pharmacist, or health care provider.  2023 Elsevier/Gold Standard (2020-12-10 00:00:00)

## 2022-03-08 NOTE — Assessment & Plan Note (Signed)
Encourage heart healthy diet such as MIND or DASH diet, increase exercise, avoid trans fats, simple carbohydrates and processed foods, consider a krill or fish or flaxseed oil cap daily.  °

## 2022-03-09 LAB — URINALYSIS
Bilirubin Urine: NEGATIVE
Hgb urine dipstick: NEGATIVE
Ketones, ur: NEGATIVE
Leukocytes,Ua: NEGATIVE
Nitrite: NEGATIVE
Specific Gravity, Urine: 1.025 (ref 1.000–1.030)
Total Protein, Urine: NEGATIVE
Urine Glucose: NEGATIVE
Urobilinogen, UA: 0.2 (ref 0.0–1.0)
pH: 6 (ref 5.0–8.0)

## 2022-03-09 LAB — CBC
HCT: 40.5 % (ref 36.0–46.0)
Hemoglobin: 13.1 g/dL (ref 12.0–15.0)
MCHC: 32.4 g/dL (ref 30.0–36.0)
MCV: 89.2 fl (ref 78.0–100.0)
Platelets: 448 10*3/uL — ABNORMAL HIGH (ref 150.0–400.0)
RBC: 4.54 Mil/uL (ref 3.87–5.11)
RDW: 15.2 % (ref 11.5–15.5)
WBC: 11.3 10*3/uL — ABNORMAL HIGH (ref 4.0–10.5)

## 2022-03-09 LAB — SEDIMENTATION RATE: Sed Rate: 31 mm/hr — ABNORMAL HIGH (ref 0–30)

## 2022-03-09 LAB — COMPREHENSIVE METABOLIC PANEL
ALT: 25 U/L (ref 0–35)
AST: 22 U/L (ref 0–37)
Albumin: 4.2 g/dL (ref 3.5–5.2)
Alkaline Phosphatase: 86 U/L (ref 39–117)
BUN: 14 mg/dL (ref 6–23)
CO2: 33 mEq/L — ABNORMAL HIGH (ref 19–32)
Calcium: 9.7 mg/dL (ref 8.4–10.5)
Chloride: 99 mEq/L (ref 96–112)
Creatinine, Ser: 0.74 mg/dL (ref 0.40–1.20)
GFR: 88.6 mL/min (ref >60.00)
Glucose, Bld: 81 mg/dL (ref 70–99)
Potassium: 3.9 mEq/L (ref 3.5–5.1)
Sodium: 139 mEq/L (ref 135–145)
Total Bilirubin: 0.4 mg/dL (ref 0.2–1.2)
Total Protein: 7.2 g/dL (ref 6.0–8.3)

## 2022-03-09 LAB — LIPID PANEL
Cholesterol: 142 mg/dL (ref 0–200)
HDL: 41.1 mg/dL (ref >39.00)
LDL Cholesterol: 75 mg/dL (ref 0–99)
NonHDL: 100.64
Total CHOL/HDL Ratio: 3
Triglycerides: 128 mg/dL (ref 0.0–149.0)
VLDL: 25.6 mg/dL (ref 0.0–40.0)

## 2022-03-09 LAB — MICROALBUMIN / CREATININE URINE RATIO
Creatinine,U: 106.1 mg/dL
Microalb Creat Ratio: 1 mg/g (ref 0.0–30.0)
Microalb, Ur: 1 mg/dL (ref 0.0–1.9)

## 2022-03-09 LAB — TSH: TSH: 1.44 u[IU]/mL (ref 0.35–5.50)

## 2022-03-09 LAB — HIGH SENSITIVITY CRP: CRP, High Sensitivity: 1.53 mg/L (ref 0.000–5.000)

## 2022-03-09 LAB — CK: Total CK: 239 U/L — ABNORMAL HIGH (ref 7–177)

## 2022-03-09 LAB — RHEUMATOID FACTOR: Rheumatoid fact SerPl-aCnc: <14 IU/mL (ref <14)

## 2022-03-10 DIAGNOSIS — R32 Unspecified urinary incontinence: Secondary | ICD-10-CM | POA: Insufficient documentation

## 2022-03-10 DIAGNOSIS — R7989 Other specified abnormal findings of blood chemistry: Secondary | ICD-10-CM | POA: Insufficient documentation

## 2022-03-10 DIAGNOSIS — M791 Myalgia, unspecified site: Secondary | ICD-10-CM | POA: Insufficient documentation

## 2022-03-10 DIAGNOSIS — D72829 Elevated white blood cell count, unspecified: Secondary | ICD-10-CM | POA: Insufficient documentation

## 2022-03-10 NOTE — Assessment & Plan Note (Signed)
Check UA and culture and perform kegel exercises as instructed.

## 2022-03-10 NOTE — Assessment & Plan Note (Signed)
Hydrate and monitor, continue tramadol prn can take up to 3 tabs a day.  

## 2022-03-10 NOTE — Assessment & Plan Note (Signed)
Encouraged DASH or MIND diet, decrease po intake and increase exercise as tolerated. Needs 7-8 hours of sleep nightly. Avoid trans fats, eat small, frequent meals every 4-5 hours with lean proteins, complex carbs and healthy fats. Minimize simple carbs, high fat foods and processed foods 

## 2022-03-10 NOTE — Assessment & Plan Note (Signed)
And elevated platelet count, referred to hematology for evaluation

## 2022-03-14 ENCOUNTER — Other Ambulatory Visit: Payer: Self-pay | Admitting: Family

## 2022-03-14 DIAGNOSIS — D75839 Thrombocytosis, unspecified: Secondary | ICD-10-CM

## 2022-03-14 DIAGNOSIS — D72829 Elevated white blood cell count, unspecified: Secondary | ICD-10-CM

## 2022-03-14 DIAGNOSIS — D509 Iron deficiency anemia, unspecified: Secondary | ICD-10-CM

## 2022-03-15 ENCOUNTER — Other Ambulatory Visit: Payer: Self-pay

## 2022-03-15 ENCOUNTER — Inpatient Hospital Stay: Payer: 59 | Attending: Hematology & Oncology

## 2022-03-15 ENCOUNTER — Inpatient Hospital Stay: Payer: 59 | Admitting: Family

## 2022-03-15 ENCOUNTER — Encounter: Payer: Self-pay | Admitting: Family

## 2022-03-15 VITALS — BP 138/67 | HR 74 | Temp 98.2°F | Resp 18 | Ht 64.0 in | Wt 238.0 lb

## 2022-03-15 DIAGNOSIS — I10 Essential (primary) hypertension: Secondary | ICD-10-CM | POA: Diagnosis not present

## 2022-03-15 DIAGNOSIS — D72829 Elevated white blood cell count, unspecified: Secondary | ICD-10-CM | POA: Diagnosis not present

## 2022-03-15 DIAGNOSIS — Z79899 Other long term (current) drug therapy: Secondary | ICD-10-CM

## 2022-03-15 DIAGNOSIS — D509 Iron deficiency anemia, unspecified: Secondary | ICD-10-CM

## 2022-03-15 DIAGNOSIS — E119 Type 2 diabetes mellitus without complications: Secondary | ICD-10-CM | POA: Insufficient documentation

## 2022-03-15 DIAGNOSIS — D75839 Thrombocytosis, unspecified: Secondary | ICD-10-CM | POA: Diagnosis not present

## 2022-03-15 LAB — CBC WITH DIFFERENTIAL (CANCER CENTER ONLY)
Abs Immature Granulocytes: 0.03 10*3/uL (ref 0.00–0.07)
Basophils Absolute: 0.1 10*3/uL (ref 0.0–0.1)
Basophils Relative: 1 %
Eosinophils Absolute: 0.2 10*3/uL (ref 0.0–0.5)
Eosinophils Relative: 2 %
HCT: 42.7 % (ref 36.0–46.0)
Hemoglobin: 13.5 g/dL (ref 12.0–15.0)
Immature Granulocytes: 0 %
Lymphocytes Relative: 27 %
Lymphs Abs: 2.6 10*3/uL (ref 0.7–4.0)
MCH: 28.8 pg (ref 26.0–34.0)
MCHC: 31.6 g/dL (ref 30.0–36.0)
MCV: 91.2 fL (ref 80.0–100.0)
Monocytes Absolute: 0.5 10*3/uL (ref 0.1–1.0)
Monocytes Relative: 5 %
Neutro Abs: 6 10*3/uL (ref 1.7–7.7)
Neutrophils Relative %: 65 %
Platelet Count: 480 10*3/uL — ABNORMAL HIGH (ref 150–400)
RBC: 4.68 MIL/uL (ref 3.87–5.11)
RDW: 14.3 % (ref 11.5–15.5)
WBC Count: 9.4 10*3/uL (ref 4.0–10.5)
nRBC: 0 % (ref 0.0–0.2)

## 2022-03-15 LAB — CMP (CANCER CENTER ONLY)
ALT: 19 U/L (ref 0–44)
AST: 17 U/L (ref 15–41)
Albumin: 4.2 g/dL (ref 3.5–5.0)
Alkaline Phosphatase: 78 U/L (ref 38–126)
Anion gap: 6 (ref 5–15)
BUN: 15 mg/dL (ref 6–20)
CO2: 33 mmol/L — ABNORMAL HIGH (ref 22–32)
Calcium: 9.9 mg/dL (ref 8.9–10.3)
Chloride: 103 mmol/L (ref 98–111)
Creatinine: 0.8 mg/dL (ref 0.44–1.00)
GFR, Estimated: >60 mL/min (ref >60)
Glucose, Bld: 82 mg/dL (ref 70–99)
Potassium: 4.1 mmol/L (ref 3.5–5.1)
Sodium: 142 mmol/L (ref 135–145)
Total Bilirubin: 0.3 mg/dL (ref 0.3–1.2)
Total Protein: 7.5 g/dL (ref 6.5–8.1)

## 2022-03-15 LAB — IRON AND IRON BINDING CAPACITY (CC-WL,HP ONLY)
Iron: 53 ug/dL (ref 28–170)
Saturation Ratios: 15 % (ref 10.4–31.8)
TIBC: 354 ug/dL (ref 250–450)
UIBC: 301 ug/dL (ref 148–442)

## 2022-03-15 LAB — DM TEMPLATE

## 2022-03-15 LAB — RETICULOCYTES
Immature Retic Fract: 15.5 % (ref 2.3–15.9)
RBC.: 4.66 MIL/uL (ref 3.87–5.11)
Retic Count, Absolute: 80.2 10*3/uL (ref 19.0–186.0)
Retic Ct Pct: 1.7 % (ref 0.4–3.1)

## 2022-03-15 LAB — URINE CULTURE

## 2022-03-15 LAB — DRUG MONITORING PANEL 376104, URINE
Amphetamines: NEGATIVE ng/mL (ref <500)
Barbiturates: NEGATIVE ng/mL (ref <300)
Benzodiazepines: NEGATIVE ng/mL (ref <100)
Cocaine Metabolite: NEGATIVE ng/mL (ref <150)
Desmethyltramadol: 349 ng/mL — ABNORMAL HIGH (ref <100)
Opiates: NEGATIVE ng/mL (ref <100)
Oxycodone: NEGATIVE ng/mL (ref <100)
Tramadol: 285 ng/mL — ABNORMAL HIGH (ref <100)

## 2022-03-15 LAB — LACTATE DEHYDROGENASE: LDH: 186 U/L (ref 98–192)

## 2022-03-15 LAB — FERRITIN: Ferritin: 22 ng/mL (ref 11–307)

## 2022-03-15 LAB — SAVE SMEAR(SSMR), FOR PROVIDER SLIDE REVIEW

## 2022-03-16 ENCOUNTER — Encounter: Payer: Self-pay | Admitting: *Deleted

## 2022-03-18 ENCOUNTER — Other Ambulatory Visit (HOSPITAL_COMMUNITY): Payer: Self-pay

## 2022-03-18 ENCOUNTER — Other Ambulatory Visit: Payer: Self-pay

## 2022-03-18 DIAGNOSIS — D509 Iron deficiency anemia, unspecified: Secondary | ICD-10-CM

## 2022-03-18 MED ORDER — FUSION PLUS PO CAPS
1.0000 | ORAL_CAPSULE | Freq: Every day | ORAL | 11 refills | Status: DC
  Filled 2022-03-18: qty 30, 30d supply, fill #0
  Filled 2022-04-13: qty 30, 30d supply, fill #1
  Filled 2022-05-10: qty 30, 30d supply, fill #2
  Filled 2022-06-16: qty 30, 30d supply, fill #3
  Filled 2022-07-13 – 2022-08-12 (×2): qty 30, 30d supply, fill #4
  Filled 2022-09-20 – 2022-10-10 (×3): qty 30, 30d supply, fill #5
  Filled 2022-11-15: qty 30, 30d supply, fill #6
  Filled 2023-01-06: qty 30, 30d supply, fill #7
  Filled 2023-02-21: qty 30, 30d supply, fill #8

## 2022-03-21 ENCOUNTER — Other Ambulatory Visit (HOSPITAL_COMMUNITY): Payer: Self-pay

## 2022-03-24 ENCOUNTER — Other Ambulatory Visit (HOSPITAL_COMMUNITY): Payer: Self-pay

## 2022-03-24 DIAGNOSIS — E119 Type 2 diabetes mellitus without complications: Secondary | ICD-10-CM | POA: Diagnosis not present

## 2022-03-24 DIAGNOSIS — H35033 Hypertensive retinopathy, bilateral: Secondary | ICD-10-CM | POA: Diagnosis not present

## 2022-03-24 DIAGNOSIS — H5211 Myopia, right eye: Secondary | ICD-10-CM | POA: Diagnosis not present

## 2022-03-24 DIAGNOSIS — H524 Presbyopia: Secondary | ICD-10-CM | POA: Diagnosis not present

## 2022-03-24 DIAGNOSIS — H52223 Regular astigmatism, bilateral: Secondary | ICD-10-CM | POA: Diagnosis not present

## 2022-03-24 DIAGNOSIS — H40013 Open angle with borderline findings, low risk, bilateral: Secondary | ICD-10-CM | POA: Diagnosis not present

## 2022-03-24 LAB — HM DIABETES EYE EXAM

## 2022-03-28 ENCOUNTER — Other Ambulatory Visit (HOSPITAL_COMMUNITY): Payer: Self-pay

## 2022-03-29 ENCOUNTER — Other Ambulatory Visit (HOSPITAL_COMMUNITY): Payer: Self-pay

## 2022-04-13 ENCOUNTER — Other Ambulatory Visit (HOSPITAL_COMMUNITY): Payer: Self-pay

## 2022-04-18 ENCOUNTER — Ambulatory Visit: Payer: 59 | Admitting: Endocrinology

## 2022-04-25 ENCOUNTER — Other Ambulatory Visit (HOSPITAL_COMMUNITY): Payer: Self-pay

## 2022-05-04 ENCOUNTER — Other Ambulatory Visit (HOSPITAL_COMMUNITY): Payer: Self-pay

## 2022-05-09 DIAGNOSIS — Z1231 Encounter for screening mammogram for malignant neoplasm of breast: Secondary | ICD-10-CM | POA: Diagnosis not present

## 2022-05-09 LAB — HM MAMMOGRAPHY

## 2022-05-10 ENCOUNTER — Other Ambulatory Visit (HOSPITAL_COMMUNITY): Payer: Self-pay

## 2022-05-11 ENCOUNTER — Other Ambulatory Visit (HOSPITAL_COMMUNITY): Payer: Self-pay

## 2022-05-19 ENCOUNTER — Encounter: Payer: Self-pay | Admitting: Family Medicine

## 2022-05-19 DIAGNOSIS — R928 Other abnormal and inconclusive findings on diagnostic imaging of breast: Secondary | ICD-10-CM | POA: Diagnosis not present

## 2022-05-20 ENCOUNTER — Other Ambulatory Visit (HOSPITAL_COMMUNITY): Payer: Self-pay

## 2022-05-24 ENCOUNTER — Other Ambulatory Visit (HOSPITAL_COMMUNITY): Payer: Self-pay

## 2022-05-31 ENCOUNTER — Other Ambulatory Visit (HOSPITAL_COMMUNITY): Payer: Self-pay

## 2022-06-03 ENCOUNTER — Other Ambulatory Visit (HOSPITAL_COMMUNITY): Payer: Self-pay

## 2022-06-06 ENCOUNTER — Other Ambulatory Visit (HOSPITAL_COMMUNITY): Payer: Self-pay

## 2022-06-15 NOTE — Assessment & Plan Note (Signed)
hgba1c acceptable, minimize simple carbs. Increase exercise as tolerated. Continue current meds 

## 2022-06-15 NOTE — Assessment & Plan Note (Signed)
Avoid offending foods, start probiotics. Do not eat large meals in late evening and consider raising head of bed.  

## 2022-06-15 NOTE — Assessment & Plan Note (Signed)
Well controlled, no changes to meds. Encouraged heart healthy diet such as the DASH diet and exercise as tolerated.  °

## 2022-06-15 NOTE — Assessment & Plan Note (Signed)
Encourage heart healthy diet such as MIND or DASH diet, increase exercise, avoid trans fats, simple carbohydrates and processed foods, consider a krill or fish or flaxseed oil cap daily. Tolerating statins 

## 2022-06-15 NOTE — Assessment & Plan Note (Signed)
Encouraged DASH or MIND diet, decrease po intake and increase exercise as tolerated. Needs 7-8 hours of sleep nightly. Avoid trans fats, eat small, frequent meals every 4-5 hours with lean proteins, complex carbs and healthy fats. Minimize simple carbs, high fat foods and processed foods 

## 2022-06-16 ENCOUNTER — Ambulatory Visit: Payer: 59 | Admitting: Family Medicine

## 2022-06-16 ENCOUNTER — Encounter: Payer: Self-pay | Admitting: Family Medicine

## 2022-06-16 ENCOUNTER — Other Ambulatory Visit (HOSPITAL_COMMUNITY): Payer: Self-pay

## 2022-06-16 VITALS — BP 128/84 | HR 79 | Temp 98.1°F | Resp 18 | Ht 64.0 in | Wt 238.6 lb

## 2022-06-16 DIAGNOSIS — I10 Essential (primary) hypertension: Secondary | ICD-10-CM

## 2022-06-16 DIAGNOSIS — K219 Gastro-esophageal reflux disease without esophagitis: Secondary | ICD-10-CM

## 2022-06-16 DIAGNOSIS — E6609 Other obesity due to excess calories: Secondary | ICD-10-CM

## 2022-06-16 DIAGNOSIS — M25551 Pain in right hip: Secondary | ICD-10-CM | POA: Diagnosis not present

## 2022-06-16 DIAGNOSIS — E782 Mixed hyperlipidemia: Secondary | ICD-10-CM | POA: Diagnosis not present

## 2022-06-16 DIAGNOSIS — E669 Obesity, unspecified: Secondary | ICD-10-CM | POA: Diagnosis not present

## 2022-06-16 DIAGNOSIS — E1169 Type 2 diabetes mellitus with other specified complication: Secondary | ICD-10-CM

## 2022-06-16 DIAGNOSIS — Z23 Encounter for immunization: Secondary | ICD-10-CM

## 2022-06-16 MED ORDER — TIZANIDINE HCL 2 MG PO TABS
1.0000 mg | ORAL_TABLET | Freq: Two times a day (BID) | ORAL | 1 refills | Status: DC | PRN
  Filled 2022-06-16: qty 30, 8d supply, fill #0

## 2022-06-16 NOTE — Progress Notes (Signed)
Subjective:   By signing my name below, I, Kellie Simmering, attest that this documentation has been prepared under the direction and in the presence of Mosie Lukes, MD 06/16/2022.     Patient ID: Eileen Richards, female    DOB: 09/26/1960, 61 y.o.   MRN: [id number]  Chief Complaint  Patient presents with   3 month follow up    Concerns/ questions: Right side pain all the way down the leg Tdap: none in ncir Flu shot today: 06/11/2022    HPI Patient is in today for an office visit.  Fall: She reports that 3 months ago she fell on her staircase and impacted her right posterior hip. She states that she is unable to lay down comfortably and complains of intermittent pain that originates in the right hip and radiates to the right ankle. She denies having incontinence. She states that she has taken a muscle relaxer in the past and is interested in taking Tizanidine to manage her pain.   Immunizations: She has been informed about receiving COVID-19 and RSV immunizations. She received the Flu immunization on 06/11/2022 and will receive a Tetanus immunization today.   Mammogram: She completed a mammogram on 05/09/2022. The grouped calcifications in the right breast are indeterminate. She states that she will have a follow up in 6 months.   Past Medical History:  Diagnosis Date   Achilles tendonitis 10/14/2012   Asthma    childhood   Atypical chest pain    normal stress echo 02/2009 -LV size was normal LV global systolic function was normal- Normal wall motion; no LV regional wall motion abnormalities   Benign paroxysmal positional vertigo 04/26/2015   Chest pain, atypical 08/24/2013   Diabetes mellitus    GERD (gastroesophageal reflux disease)    Hand pain, left 11/10/2015   Hyperlipemia    Hypertension    Neuropathic pain of ankle, left 04/27/2017   Onychomycosis 04/17/2014   Pain of right heel 10/14/2012   Preventative health care 11/22/2015   Sinusitis, acute 01/17/2016   Thoracic outlet  syndrome 11/03/2014   Urinary urgency 12/15/2015   Past Surgical History:  Procedure Laterality Date   ABDOMINAL HYSTERECTOMY     CYSTOSCOPY  07/23/2012   Procedure: CYSTOSCOPY;  Surgeon: Marvene Staff, MD;  Location: [facility];  Service: Gynecology;  Laterality: N/A;   DILATION AND CURETTAGE OF UTERUS     Lysis of Adhesions   ROBOTIC ASSISTED LAPAROSCOPIC LYSIS OF ADHESION  07/23/2012   Procedure: ROBOTIC ASSISTED LAPAROSCOPIC LYSIS OF ADHESION;  Surgeon: Marvene Staff, MD;  Location: [facility];  Service: Gynecology;  Laterality: N/A;   ROBOTIC ASSISTED SALPINGO OOPHERECTOMY  07/23/2012   Procedure: ROBOTIC ASSISTED SALPINGO OOPHERECTOMY;  Surgeon: Marvene Staff, MD;  Location: [facility];  Service: Gynecology;  Laterality: Bilateral;   Family History  Problem Relation Age of Onset   GER disease Mother    GER disease Father    Heart disease Father        father has "heart problems"   Anemia Father    Asthma Other    Hypertension Other    Diabetes Other    Social History   Socioeconomic History   Marital status: Single    Spouse name: Not on file   Number of children: Not on file   Years of education: Not on file   Highest education level: Not on file  Occupational History   Not on file  Tobacco Use   Smoking status: Former  Packs/day: 0.50    Types: Cigarettes    Quit date: 02/17/2014    Years since quitting: 8.3   Smokeless tobacco: Never  Vaping Use   Vaping Use: Never used  Substance and Sexual Activity   Alcohol use: No   Drug use: No   Sexual activity: Not on file  Other Topics Concern   Not on file  Social History Narrative   Not on file   Social Determinants of Health   Financial Resource Strain: Not on file  Food Insecurity: Not on file  Transportation Needs: Not on file  Physical Activity: Not on file  Stress: Not on file  Social Connections: Not on file  Intimate Partner Violence: Not on file   Outpatient Medications Prior to Visit   Medication Sig Dispense Refill   Accu-Chek FastClix Lancets MISC USE AS DIRECTED TWICE DAILY TO CHECK BLOOD SUGAR. DX E11.9 (Patient taking differently: 1 each by Other route See admin instructions. Use as directed twice daily to check blood sugar.  DX E11.9) 102 each 6   aspirin 81 MG tablet Take 81 mg by mouth daily.     blood glucose meter kit and supplies KIT Dispense based on patient and insurance preference. Use up to four times daily as directed. (FOR ICD-9 250.00, 250.01). (Patient taking differently: Inject 1 each into the skin as directed. Dispense based on patient and insurance preference. Use up to four times daily as directed. (FOR ICD-9 250.00, 250.01).) 1 each 0   Blood Glucose Monitoring Suppl (FREESTYLE FREEDOM LITE) w/Device KIT Use to check sugar twice a day.  Dx Code: E11.9 (Patient taking differently: 1 each by Other route See admin instructions. Use to check sugar twice a day.  Dx Code: E11.9) 1 kit 0   carvedilol (COREG) 3.125 MG tablet Take 1 tablet (3.125 mg total) by mouth 2 (two) times daily with a meal 180 tablet 1   cetirizine (ZYRTEC) 10 MG tablet TAKE 1 TABLET BY MOUTH 2 TIMES DAILY. (Patient taking differently: Take 10 mg by mouth 2 (two) times daily.) 200 tablet 1   Continuous Blood Gluc Receiver (FREESTYLE LIBRE 2 READER) DEVI USE AS DIRECTED. (Patient taking differently: 1 each by Other route See admin instructions. Glucose reading) 1 each 1   Continuous Blood Gluc Sensor (FREESTYLE LIBRE 2 SENSOR) MISC USE AS DIRECTED EVERY 14 DAYS. 6 each 3   diclofenac Sodium (VOLTAREN) 1 % GEL Apply 2 g topically 4 (four) times daily. Rub into affected area of foot 2 to 4 times daily (Patient taking differently: Apply 2 g topically as needed (Joint pain). Rub into affected area of foot 2 to 4 times daily) 100 g 2   famotidine (PEPCID) 20 MG tablet Take 1 tablet (20 mg total) by mouth 2 (two) times daily. 180 tablet 3   gabapentin (NEURONTIN) 300 MG capsule Take 1 capsule (300 mg  total) by mouth 3 (three) times daily. (Patient taking differently: Take 300 mg by mouth as needed (Nerve pain).) 90 capsule 1   hydrochlorothiazide (HYDRODIURIL) 25 MG tablet Take 1 tablet (25 mg total) by mouth daily. 90 tablet 1   insulin glargine-yfgn (SEMGLEE, YFGN,) 100 UNIT/ML Pen Inject 48 Units into the skin every morning. 60 mL 1   Insulin Pen Needle 32G X 4 MM MISC Use once daily 100 each 3   Iron-FA-B Cmp-C-Biot-Probiotic (FUSION PLUS) CAPS Take 1 capsule by mouth daily. 30 capsule 11   meloxicam (MOBIC) 15 MG tablet Take 1 tablet (15 mg total)  by mouth daily. 30 tablet 0   metFORMIN (GLUCOPHAGE-XR) 500 MG 24 hr tablet Take 4 tablets (2,000 mg total) by mouth daily. 360 tablet 3   nitroGLYCERIN (NITROSTAT) 0.4 MG SL tablet Place 1 tablet (0.4 mg total) under the tongue every 5 (five) minutes x 3 doses as needed for chest pain. 40 tablet 1   pantoprazole (PROTONIX) 40 MG tablet Take 1 tablet (40 mg total) by mouth daily. 90 tablet 1   potassium chloride SA (KLOR-CON M) 20 MEQ tablet Take 1 tablet (20 mEq total) by mouth daily. 90 tablet 1   promethazine (PHENERGAN) 25 MG tablet Take 1 tablet (25 mg total) by mouth every 8 (eight) hours as needed for nausea or vomiting. 20 tablet 0   rosuvastatin (CRESTOR) 20 MG tablet Take 1 tablet (20 mg total) by mouth daily. 90 tablet 3   Semaglutide, 2 MG/DOSE, (OZEMPIC, 2 MG/DOSE,) 8 MG/3ML SOPN Inject 2 mg into the skin once a week. 9 mL 1   traMADol (ULTRAM) 50 MG tablet Take 1 tablet (50 mg total) by mouth 3 (three) times daily as needed. 90 tablet 0   albuterol (VENTOLIN HFA) 108 (90 Base) MCG/ACT inhaler INHALE 2 PUFFS INTO THE LUNGS EVERY 6 (SIX) HOURS AS NEEDED FOR WHEEZING OR SHORTNESS OF BREATH. 18 g 5   No facility-administered medications prior to visit.   Allergies  Allergen Reactions   Farxiga [Dapagliflozin] Hives   Review of Systems  Genitourinary:        (-) Incontinence.  Musculoskeletal:        (+) Right hip pain that  radiates to the right ankle.      Objective:    Physical Exam Constitutional:      General: She is not in acute distress.    Appearance: Normal appearance. She is not ill-appearing.  HENT:     Head: Normocephalic and atraumatic.     Right Ear: External ear normal.     Left Ear: External ear normal.     Mouth/Throat:     Mouth: Mucous membranes are moist.     Pharynx: Oropharynx is clear.  Eyes:     Extraocular Movements: Extraocular movements intact.     Pupils: Pupils are equal, round, and reactive to light.  Cardiovascular:     Rate and Rhythm: Normal rate and regular rhythm.     Pulses: Normal pulses.     Heart sounds: Normal heart sounds. No murmur heard.    No gallop.  Pulmonary:     Effort: Pulmonary effort is normal. No respiratory distress.     Breath sounds: Normal breath sounds. No wheezing or rales.  Abdominal:     General: Bowel sounds are normal.  Skin:    General: Skin is warm and dry.  Neurological:     Mental Status: She is alert and oriented to person, place, and time.  Psychiatric:        Mood and Affect: Mood normal.        Behavior: Behavior normal.        Judgment: Judgment normal.    BP 128/84 (BP Location: Left Arm, Patient Position: Sitting, Cuff Size: Normal)   Pulse 79   Temp 98.1 F (36.7 C) (Oral)   Resp 18   Ht _0  (1.626 m)   Wt 238 lb 9.6 oz (108.2 kg)   SpO2 99%   BMI 40.96 kg/m  Wt Readings from Last 3 Encounters:  06/16/22 238 lb 9.6 oz (108.2 kg)  03/15/22  238 lb (108 kg)  03/08/22 238 lb 3.2 oz (108 kg)   Diabetic Foot Exam - Simple   No data filed    Lab Results  Component Value Date   WBC 11.0 (H) 06/16/2022   HGB 13.3 06/16/2022   HCT 40.5 06/16/2022   PLT 483.0 (H) 06/16/2022   GLUCOSE 61 (L) 06/16/2022   CHOL 141 06/16/2022   TRIG 120.0 06/16/2022   HDL 41.40 06/16/2022   LDLDIRECT 100.0 08/03/2017   LDLCALC 75 06/16/2022   ALT 31 06/16/2022   AST 23 06/16/2022   NA 143 06/16/2022   K 4.3 06/16/2022    CL 103 06/16/2022   CREATININE 0.79 06/16/2022   BUN 12 06/16/2022   CO2 32 06/16/2022   TSH 1.08 06/16/2022   HGBA1C 7.4 (H) 06/16/2022   MICROALBUR 1.0 03/08/2022   Lab Results  Component Value Date   TSH 1.08 06/16/2022   Lab Results  Component Value Date   WBC 11.0 (H) 06/16/2022   HGB 13.3 06/16/2022   HCT 40.5 06/16/2022   MCV 90.2 06/16/2022   PLT 483.0 (H) 06/16/2022   Lab Results  Component Value Date   NA 143 06/16/2022   K 4.3 06/16/2022   CO2 32 06/16/2022   GLUCOSE 61 (L) 06/16/2022   BUN 12 06/16/2022   CREATININE 0.79 06/16/2022   BILITOT 0.4 06/16/2022   ALKPHOS 76 06/16/2022   AST 23 06/16/2022   ALT 31 06/16/2022   PROT 6.7 06/16/2022   ALBUMIN 3.9 06/16/2022   CALCIUM 9.3 06/16/2022   ANIONGAP 6 03/15/2022   GFR 81.76 06/16/2022   Lab Results  Component Value Date   CHOL 141 06/16/2022   Lab Results  Component Value Date   HDL 41.40 06/16/2022   Lab Results  Component Value Date   LDLCALC 75 06/16/2022   Lab Results  Component Value Date   TRIG 120.0 06/16/2022   Lab Results  Component Value Date   CHOLHDL 3 06/16/2022   Lab Results  Component Value Date   HGBA1C 7.4 (H) 06/16/2022      Assessment & Plan:   Problem List Items Addressed This Visit     Diabetes mellitus type 2 in obese ([place])    hgba1c acceptable, minimize simple carbs. Increase exercise as tolerated. Continue current meds      Relevant Orders   Hemoglobin A1c (Completed)   Hyperlipidemia, mixed    Encourage heart healthy diet such as MIND or DASH diet, increase exercise, avoid trans fats, simple carbohydrates and processed foods, consider a krill or fish or flaxseed oil cap daily. Tolerating statins      Relevant Orders   Lipid panel (Completed)   Obesity    Encouraged DASH or MIND diet, decrease po intake and increase exercise as tolerated. Needs 7-8 hours of sleep nightly. Avoid trans fats, eat small, frequent meals every 4-5 hours with lean proteins,  complex carbs and healthy fats. Minimize simple carbs, high fat foods and processed foods      Essential hypertension    Well controlled, no changes to meds. Encouraged heart healthy diet such as the DASH diet and exercise as tolerated.       Relevant Orders   CBC (Completed)   Comprehensive metabolic panel (Completed)   TSH (Completed)   GERD (gastroesophageal reflux disease)    Avoid offending foods, start probiotics. Do not eat large meals in late evening and consider raising head of bed.       Right hip pain  She had a fall recently and is now struggling with right hip pain, initially she had radicular pain down her right leg but that has been improved the past few days. Slowly improved. Encouraged moist heat and gentle stretching as tolerated. May try NSAIDs and prescription meds as directed and report if symptoms worsen or seek immediate care. Given a prescription for Tizanidine 2 mg tabs to use 1-4 mg bid prn to try      Other Visit Diagnoses     Need for Tdap vaccination    -  Primary   Relevant Orders   Tdap vaccine greater than or equal to 7yo IM (Completed)      Meds ordered this encounter  Medications   tiZANidine (ZANAFLEX) 2 MG tablet    Sig: Take 0.5-2 tablets (1-4 mg total) by mouth 2 (two) times daily as needed for muscle spasms.    Dispense:  30 tablet    Refill:  1   I, Penni Homans, MD, personally preformed the services described in this documentation.  All medical record entries made by the scribe were at my direction and in my presence.  I have reviewed the chart and discharge instructions (if applicable) and agree that the record reflects my personal performance and is accurate and complete. 06/16/2022  I,Mohammed Iqbal,acting as a scribe for Penni Homans, MD.,have documented all relevant documentation on the behalf of Penni Homans, MD,as directed by  Penni Homans, MD while in the presence of Penni Homans, MD.  Penni Homans, MD

## 2022-06-16 NOTE — Patient Instructions (Signed)
Hypertension, Adult High blood pressure (hypertension) is when the force of blood pumping through the arteries is too strong. The arteries are the blood vessels that carry blood from the heart throughout the body. Hypertension forces the heart to work harder to pump blood and may cause arteries to become narrow or stiff. Untreated or uncontrolled hypertension can lead to a heart attack, heart failure, a stroke, kidney disease, and other problems. A blood pressure reading consists of a higher number over a lower number. Ideally, your blood pressure should be below 120/80. The first ("top") number is called the systolic pressure. It is a measure of the pressure in your arteries as your heart beats. The second ("bottom") number is called the diastolic pressure. It is a measure of the pressure in your arteries as the heart relaxes. What are the causes? The exact cause of this condition is not known. There are some conditions that result in high blood pressure. What increases the risk? Certain factors may make you more likely to develop high blood pressure. Some of these risk factors are under your control, including: Smoking. Not getting enough exercise or physical activity. Being overweight. Having too much fat, sugar, calories, or salt (sodium) in your diet. Drinking too much alcohol. Other risk factors include: Having a personal history of heart disease, diabetes, high cholesterol, or kidney disease. Stress. Having a family history of high blood pressure and high cholesterol. Having obstructive sleep apnea. Age. The risk increases with age. What are the signs or symptoms? High blood pressure may not cause symptoms. Very high blood pressure (hypertensive crisis) may cause: Headache. Fast or irregular heartbeats (palpitations). Shortness of breath. Nosebleed. Nausea and vomiting. Vision changes. Severe chest pain, dizziness, and seizures. How is this diagnosed? This condition is diagnosed by  measuring your blood pressure while you are seated, with your arm resting on a flat surface, your legs uncrossed, and your feet flat on the floor. The cuff of the blood pressure monitor will be placed directly against the skin of your upper arm at the level of your heart. Blood pressure should be measured at least twice using the same arm. Certain conditions can cause a difference in blood pressure between your right and left arms. If you have a high blood pressure reading during one visit or you have normal blood pressure with other risk factors, you may be asked to: Return on a different day to have your blood pressure checked again. Monitor your blood pressure at home for 1 week or longer. If you are diagnosed with hypertension, you may have other blood or imaging tests to help your health care provider understand your overall risk for other conditions. How is this treated? This condition is treated by making healthy lifestyle changes, such as eating healthy foods, exercising more, and reducing your alcohol intake. You may be referred for counseling on a healthy diet and physical activity. Your health care provider may prescribe medicine if lifestyle changes are not enough to get your blood pressure under control and if: Your systolic blood pressure is above 130. Your diastolic blood pressure is above 80. Your personal target blood pressure may vary depending on your medical conditions, your age, and other factors. Follow these instructions at home: Eating and drinking  Eat a diet that is high in fiber and potassium, and low in sodium, added sugar, and fat. An example of this eating plan is called the DASH diet. DASH stands for Dietary Approaches to Stop Hypertension. To eat this way: Eat   plenty of fresh fruits and vegetables. Try to fill one half of your plate at each meal with fruits and vegetables. Eat whole grains, such as whole-wheat pasta, brown rice, or whole-grain bread. Fill about one  fourth of your plate with whole grains. Eat or drink low-fat dairy products, such as skim milk or low-fat yogurt. Avoid fatty cuts of meat, processed or cured meats, and poultry with skin. Fill about one fourth of your plate with lean proteins, such as fish, chicken without skin, beans, eggs, or tofu. Avoid pre-made and processed foods. These tend to be higher in sodium, added sugar, and fat. Reduce your daily sodium intake. Many people with hypertension should eat less than 1,500 mg of sodium a day. Do not drink alcohol if: Your health care provider tells you not to drink. You are pregnant, may be pregnant, or are planning to become pregnant. If you drink alcohol: Limit how much you have to: 0-1 drink a day for women. 0-2 drinks a day for men. Know how much alcohol is in your drink. In the [place] one drink equals one 12 oz bottle of beer (355 mL), one 5 oz glass of wine (148 mL), or one 1 oz glass of hard liquor (44 mL). Lifestyle  Work with your health care provider to maintain a healthy body weight or to lose weight. Ask what an ideal weight is for you. Get at least 30 minutes of exercise that causes your heart to beat faster (aerobic exercise) most days of the week. Activities may include walking, swimming, or biking. Include exercise to strengthen your muscles (resistance exercise), such as Pilates or lifting weights, as part of your weekly exercise routine. Try to do these types of exercises for 30 minutes at least 3 days a week. Do not use any products that contain nicotine or tobacco. These products include cigarettes, chewing tobacco, and vaping devices, such as e-cigarettes. If you need help quitting, ask your health care provider. Monitor your blood pressure at home as told by your health care provider. Keep all follow-up visits. This is important. Medicines Take over-the-counter and prescription medicines only as told by your health care provider. Follow directions carefully. Blood  pressure medicines must be taken as prescribed. Do not skip doses of blood pressure medicine. Doing this puts you at risk for problems and can make the medicine less effective. Ask your health care provider about side effects or reactions to medicines that you should watch for. Contact a health care provider if you: Think you are having a reaction to a medicine you are taking. Have headaches that keep coming back (recurring). Feel dizzy. Have swelling in your ankles. Have trouble with your vision. Get help right away if you: Develop a severe headache or confusion. Have unusual weakness or numbness. Feel faint. Have severe pain in your chest or abdomen. Vomit repeatedly. Have trouble breathing. These symptoms may be an emergency. Get help right away. Call 911. Do not wait to see if the symptoms will go away. Do not drive yourself to the hospital. Summary Hypertension is when the force of blood pumping through your arteries is too strong. If this condition is not controlled, it may put you at risk for serious complications. Your personal target blood pressure may vary depending on your medical conditions, your age, and other factors. For most people, a normal blood pressure is less than 120/80. Hypertension is treated with lifestyle changes, medicines, or a combination of both. Lifestyle changes include losing weight, eating a healthy,   low-sodium diet, exercising more, and limiting alcohol. This information is not intended to replace advice given to you by your health care provider. Make sure you discuss any questions you have with your health care provider. Document Revised: 07/13/2021 Document Reviewed: 07/13/2021 Elsevier Patient Education  2023 [employer]  

## 2022-06-17 ENCOUNTER — Other Ambulatory Visit (HOSPITAL_COMMUNITY): Payer: Self-pay

## 2022-06-17 LAB — COMPREHENSIVE METABOLIC PANEL
ALT: 31 U/L (ref 0–35)
AST: 23 U/L (ref 0–37)
Albumin: 3.9 g/dL (ref 3.5–5.2)
Alkaline Phosphatase: 76 U/L (ref 39–117)
BUN: 12 mg/dL (ref 6–23)
CO2: 32 mEq/L (ref 19–32)
Calcium: 9.3 mg/dL (ref 8.4–10.5)
Chloride: 103 mEq/L (ref 96–112)
Creatinine, Ser: 0.79 mg/dL (ref 0.40–1.20)
GFR: 81.76 mL/min (ref >60.00)
Glucose, Bld: 61 mg/dL — ABNORMAL LOW (ref 70–99)
Potassium: 4.3 mEq/L (ref 3.5–5.1)
Sodium: 143 mEq/L (ref 135–145)
Total Bilirubin: 0.4 mg/dL (ref 0.2–1.2)
Total Protein: 6.7 g/dL (ref 6.0–8.3)

## 2022-06-17 LAB — CBC
HCT: 40.5 % (ref 36.0–46.0)
Hemoglobin: 13.3 g/dL (ref 12.0–15.0)
MCHC: 32.9 g/dL (ref 30.0–36.0)
MCV: 90.2 fl (ref 78.0–100.0)
Platelets: 483 10*3/uL — ABNORMAL HIGH (ref 150.0–400.0)
RBC: 4.49 Mil/uL (ref 3.87–5.11)
RDW: 14.5 % (ref 11.5–15.5)
WBC: 11 10*3/uL — ABNORMAL HIGH (ref 4.0–10.5)

## 2022-06-17 LAB — LIPID PANEL
Cholesterol: 141 mg/dL (ref 0–200)
HDL: 41.4 mg/dL
LDL Cholesterol: 75 mg/dL (ref 0–99)
NonHDL: 99.13
Total CHOL/HDL Ratio: 3
Triglycerides: 120 mg/dL (ref 0.0–149.0)
VLDL: 24 mg/dL (ref 0.0–40.0)

## 2022-06-17 LAB — HEMOGLOBIN A1C: Hgb A1c MFr Bld: 7.4 % — ABNORMAL HIGH (ref 4.6–6.5)

## 2022-06-17 LAB — TSH: TSH: 1.08 u[IU]/mL (ref 0.35–5.50)

## 2022-06-17 NOTE — Assessment & Plan Note (Signed)
She had a fall recently and is now struggling with right hip pain, initially she had radicular pain down her right leg but that has been improved the past few days. Slowly improved. Encouraged moist heat and gentle stretching as tolerated. May try NSAIDs and prescription meds as directed and report if symptoms worsen or seek immediate care. Given a prescription for Tizanidine 2 mg tabs to use 1-4 mg bid prn to try

## 2022-06-21 ENCOUNTER — Encounter: Payer: Self-pay | Admitting: *Deleted

## 2022-06-22 ENCOUNTER — Other Ambulatory Visit (HOSPITAL_COMMUNITY): Payer: Self-pay

## 2022-06-29 ENCOUNTER — Other Ambulatory Visit: Payer: Self-pay | Admitting: Family Medicine

## 2022-06-29 ENCOUNTER — Other Ambulatory Visit (HOSPITAL_COMMUNITY): Payer: Self-pay

## 2022-06-29 MED ORDER — ALBUTEROL SULFATE HFA 108 (90 BASE) MCG/ACT IN AERS
2.0000 | INHALATION_SPRAY | Freq: Four times a day (QID) | RESPIRATORY_TRACT | 5 refills | Status: DC | PRN
  Filled 2022-06-29: qty 6.7, 25d supply, fill #0

## 2022-07-05 ENCOUNTER — Other Ambulatory Visit (HOSPITAL_COMMUNITY): Payer: Self-pay

## 2022-07-13 ENCOUNTER — Other Ambulatory Visit (HOSPITAL_COMMUNITY): Payer: Self-pay

## 2022-08-12 ENCOUNTER — Other Ambulatory Visit: Payer: Self-pay | Admitting: Family Medicine

## 2022-08-12 ENCOUNTER — Other Ambulatory Visit (HOSPITAL_COMMUNITY): Payer: Self-pay

## 2022-08-13 ENCOUNTER — Other Ambulatory Visit (HOSPITAL_COMMUNITY): Payer: Self-pay

## 2022-08-13 MED ORDER — POTASSIUM CHLORIDE CRYS ER 20 MEQ PO TBCR
20.0000 meq | EXTENDED_RELEASE_TABLET | Freq: Every day | ORAL | 1 refills | Status: DC
  Filled 2022-08-13: qty 90, 90d supply, fill #0
  Filled 2022-11-15: qty 90, 90d supply, fill #1

## 2022-08-13 MED ORDER — CARVEDILOL 3.125 MG PO TABS
3.1250 mg | ORAL_TABLET | Freq: Two times a day (BID) | ORAL | 1 refills | Status: DC
  Filled 2022-08-13: qty 180, 90d supply, fill #0
  Filled 2022-11-15: qty 180, 90d supply, fill #1

## 2022-08-13 MED ORDER — HYDROCHLOROTHIAZIDE 25 MG PO TABS
25.0000 mg | ORAL_TABLET | Freq: Every day | ORAL | 1 refills | Status: DC
  Filled 2022-08-13: qty 90, 90d supply, fill #0
  Filled 2022-11-15: qty 90, 90d supply, fill #1

## 2022-08-13 MED ORDER — PANTOPRAZOLE SODIUM 40 MG PO TBEC
40.0000 mg | DELAYED_RELEASE_TABLET | Freq: Every day | ORAL | 1 refills | Status: DC
  Filled 2022-08-13: qty 90, 90d supply, fill #0
  Filled 2022-11-15: qty 90, 90d supply, fill #1

## 2022-08-14 ENCOUNTER — Other Ambulatory Visit (HOSPITAL_COMMUNITY): Payer: Self-pay

## 2022-08-15 ENCOUNTER — Other Ambulatory Visit (HOSPITAL_COMMUNITY): Payer: Self-pay

## 2022-09-07 ENCOUNTER — Other Ambulatory Visit: Payer: Self-pay | Admitting: Endocrinology

## 2022-09-07 ENCOUNTER — Other Ambulatory Visit: Payer: Self-pay

## 2022-09-07 ENCOUNTER — Other Ambulatory Visit (HOSPITAL_COMMUNITY): Payer: Self-pay

## 2022-09-08 ENCOUNTER — Other Ambulatory Visit (HOSPITAL_COMMUNITY): Payer: Self-pay

## 2022-09-08 MED ORDER — INSULIN GLARGINE-YFGN 100 UNIT/ML ~~LOC~~ SOPN
48.0000 [IU] | PEN_INJECTOR | SUBCUTANEOUS | 1 refills | Status: DC
  Filled 2022-09-08: qty 42, 87d supply, fill #0
  Filled 2022-11-29: qty 42, 87d supply, fill #1
  Filled 2023-04-04: qty 36, 75d supply, fill #2

## 2022-09-22 ENCOUNTER — Other Ambulatory Visit (HOSPITAL_COMMUNITY): Payer: Self-pay

## 2022-10-07 ENCOUNTER — Other Ambulatory Visit (HOSPITAL_COMMUNITY): Payer: Self-pay

## 2022-10-10 ENCOUNTER — Other Ambulatory Visit (HOSPITAL_COMMUNITY): Payer: Self-pay

## 2022-10-11 ENCOUNTER — Other Ambulatory Visit (HOSPITAL_COMMUNITY): Payer: Self-pay

## 2022-10-11 ENCOUNTER — Other Ambulatory Visit: Payer: Self-pay

## 2022-10-16 NOTE — Assessment & Plan Note (Signed)
Encouraged DASH or MIND diet, decrease po intake and increase exercise as tolerated. Needs 7-8 hours of sleep nightly. Avoid trans fats, eat small, frequent meals every 4-5 hours with lean proteins, complex carbs and healthy fats. Minimize simple carbs, high fat foods and processed foods 

## 2022-10-16 NOTE — Assessment & Plan Note (Signed)
hgba1c acceptable, minimize simple carbs. Increase exercise as tolerated. Continue current meds 

## 2022-10-16 NOTE — Assessment & Plan Note (Signed)
Well controlled, no changes to meds. Encouraged heart healthy diet such as the DASH diet and exercise as tolerated.  °

## 2022-10-16 NOTE — Assessment & Plan Note (Signed)
Hydrate and monitor, continue tramadol prn can take up to 3 tabs a day.

## 2022-10-16 NOTE — Assessment & Plan Note (Signed)
Encourage heart healthy diet such as MIND or DASH diet, increase exercise, avoid trans fats, simple carbohydrates and processed foods, consider a krill or fish or flaxseed oil cap daily. Tolerating statins

## 2022-10-16 NOTE — Assessment & Plan Note (Signed)
Mild and asymptomatic at last check, repeat labs today

## 2022-10-17 ENCOUNTER — Other Ambulatory Visit (HOSPITAL_COMMUNITY): Payer: Self-pay

## 2022-10-17 ENCOUNTER — Ambulatory Visit: Payer: Commercial Managed Care - PPO | Admitting: Family Medicine

## 2022-10-17 VITALS — BP 136/80 | HR 89 | Temp 98.0°F | Resp 16 | Ht 64.0 in | Wt 229.0 lb

## 2022-10-17 DIAGNOSIS — I10 Essential (primary) hypertension: Secondary | ICD-10-CM

## 2022-10-17 DIAGNOSIS — M25551 Pain in right hip: Secondary | ICD-10-CM | POA: Diagnosis not present

## 2022-10-17 DIAGNOSIS — E782 Mixed hyperlipidemia: Secondary | ICD-10-CM | POA: Diagnosis not present

## 2022-10-17 DIAGNOSIS — E1169 Type 2 diabetes mellitus with other specified complication: Secondary | ICD-10-CM | POA: Diagnosis not present

## 2022-10-17 DIAGNOSIS — E6609 Other obesity due to excess calories: Secondary | ICD-10-CM

## 2022-10-17 DIAGNOSIS — M25559 Pain in unspecified hip: Secondary | ICD-10-CM

## 2022-10-17 DIAGNOSIS — E669 Obesity, unspecified: Secondary | ICD-10-CM | POA: Diagnosis not present

## 2022-10-17 DIAGNOSIS — M791 Myalgia, unspecified site: Secondary | ICD-10-CM

## 2022-10-17 DIAGNOSIS — D72829 Elevated white blood cell count, unspecified: Secondary | ICD-10-CM

## 2022-10-17 DIAGNOSIS — M79671 Pain in right foot: Secondary | ICD-10-CM

## 2022-10-17 MED ORDER — QVAR REDIHALER 40 MCG/ACT IN AERB
1.0000 | INHALATION_SPRAY | Freq: Two times a day (BID) | RESPIRATORY_TRACT | 5 refills | Status: DC
  Filled 2022-10-17: qty 10.6, 25d supply, fill #0
  Filled 2022-10-21: qty 10.6, 30d supply, fill #0

## 2022-10-17 MED ORDER — GABAPENTIN 300 MG PO CAPS
300.0000 mg | ORAL_CAPSULE | Freq: Three times a day (TID) | ORAL | 3 refills | Status: DC
  Filled 2022-10-17: qty 180, 30d supply, fill #0
  Filled 2022-11-15: qty 180, 30d supply, fill #1
  Filled 2023-01-06: qty 180, 30d supply, fill #2

## 2022-10-17 MED ORDER — ALBUTEROL SULFATE HFA 108 (90 BASE) MCG/ACT IN AERS
2.0000 | INHALATION_SPRAY | Freq: Four times a day (QID) | RESPIRATORY_TRACT | 5 refills | Status: DC | PRN
  Filled 2022-10-17: qty 6.7, 25d supply, fill #0

## 2022-10-17 NOTE — Patient Instructions (Signed)
Acute Back Pain, Adult Acute back pain is sudden and usually short-lived. It is often caused by an injury to the muscles and tissues in the back. The injury may result from: A muscle, tendon, or ligament getting overstretched or torn. Ligaments are tissues that connect bones to each other. Lifting something improperly can cause a back strain. Wear and tear (degeneration) of the spinal disks. Spinal disks are circular tissue that provide cushioning between the bones of the spine (vertebrae). Twisting motions, such as while playing sports or doing yard work. A hit to the back. Arthritis. You may have a physical exam, lab tests, and imaging tests to find the cause of your pain. Acute back pain usually goes away with rest and home care. Follow these instructions at home: Managing pain, stiffness, and swelling Take over-the-counter and prescription medicines only as told by your health care provider. Treatment may include medicines for pain and inflammation that are taken by mouth or applied to the skin, or muscle relaxants. Your health care provider may recommend applying ice during the first 24-48 hours after your pain starts. To do this: Put ice in a plastic bag. Place a towel between your skin and the bag. Leave the ice on for 20 minutes, 2-3 times a day. Remove the ice if your skin turns bright red. This is very important. If you cannot feel pain, heat, or cold, you have a greater risk of damage to the area. If directed, apply heat to the affected area as often as told by your health care provider. Use the heat source that your health care provider recommends, such as a moist heat pack or a heating pad. Place a towel between your skin and the heat source. Leave the heat on for 20-30 minutes. Remove the heat if your skin turns bright red. This is especially important if you are unable to feel pain, heat, or cold. You have a greater risk of getting burned. Activity  Do not stay in bed. Staying in  bed for more than 1-2 days can delay your recovery. Sit up and stand up straight. Avoid leaning forward when you sit or hunching over when you stand. If you work at a desk, sit close to it so you do not need to lean over. Keep your chin tucked in. Keep your neck drawn back, and keep your elbows bent at a 90-degree angle (right angle). Sit high and close to the steering wheel when you drive. Add lower back (lumbar) support to your car seat, if needed. Take short walks on even surfaces as soon as you are able. Try to increase the length of time you walk each day. Do not sit, drive, or stand in one place for more than 30 minutes at a time. Sitting or standing for long periods of time can put stress on your back. Do not drive or use heavy machinery while taking prescription pain medicine. Use proper lifting techniques. When you bend and lift, use positions that put less stress on your back: [place] your knees. Keep the load close to your body. Avoid twisting. Exercise regularly as told by your health care provider. Exercising helps your back heal faster and helps prevent back injuries by keeping muscles strong and flexible. Work with a physical therapist to make a safe exercise program, as recommended by your health care provider. Do any exercises as told by your physical therapist. Lifestyle Maintain a healthy weight. Extra weight puts stress on your back and makes it difficult to have good  posture. Avoid activities or situations that make you feel anxious or stressed. Stress and anxiety increase muscle tension and can make back pain worse. Learn ways to manage anxiety and stress, such as through exercise. General instructions Sleep on a firm mattress in a comfortable position. Try lying on your side with your knees slightly bent. If you lie on your back, put a pillow under your knees. Keep your head and neck in a straight line with your spine (neutral position) when using electronic equipment like  smartphones or pads. To do this: Raise your smartphone or pad to look at it instead of bending your head or neck to look down. Put the smartphone or pad at the level of your face while looking at the screen. Follow your treatment plan as told by your health care provider. This may include: Cognitive or behavioral therapy. Acupuncture or massage therapy. Meditation or yoga. Contact a health care provider if: You have pain that is not relieved with rest or medicine. You have increasing pain going down into your legs or buttocks. Your pain does not improve after 2 weeks. You have pain at night. You lose weight without trying. You have a fever or chills. You develop nausea or vomiting. You develop abdominal pain. Get help right away if: You develop new bowel or bladder control problems. You have unusual weakness or numbness in your arms or legs. You feel faint. These symptoms may represent a serious problem that is an emergency. Do not wait to see if the symptoms will go away. Get medical help right away. Call your local emergency services (911 in the [place] Do not drive yourself to the hospital. Summary Acute back pain is sudden and usually short-lived. Use proper lifting techniques. When you bend and lift, use positions that put less stress on your back. Take over-the-counter and prescription medicines only as told by your health care provider, and apply heat or ice as told. This information is not intended to replace advice given to you by your health care provider. Make sure you discuss any questions you have with your health care provider. Document Revised: 11/27/2020 Document Reviewed: 11/27/2020 Elsevier Patient Education  2023 [employer]  

## 2022-10-19 ENCOUNTER — Other Ambulatory Visit (HOSPITAL_COMMUNITY): Payer: Self-pay

## 2022-10-19 ENCOUNTER — Other Ambulatory Visit: Payer: Self-pay | Admitting: Family Medicine

## 2022-10-19 MED ORDER — OZEMPIC (2 MG/DOSE) 8 MG/3ML ~~LOC~~ SOPN
2.0000 mg | PEN_INJECTOR | SUBCUTANEOUS | 1 refills | Status: DC
  Filled 2022-10-19: qty 3, 28d supply, fill #0
  Filled 2022-11-15: qty 3, 28d supply, fill #1
  Filled 2022-11-22: qty 9, 84d supply, fill #1
  Filled 2023-02-21: qty 6, 56d supply, fill #2

## 2022-10-20 ENCOUNTER — Other Ambulatory Visit (INDEPENDENT_AMBULATORY_CARE_PROVIDER_SITE_OTHER): Payer: Commercial Managed Care - PPO

## 2022-10-20 ENCOUNTER — Other Ambulatory Visit (HOSPITAL_BASED_OUTPATIENT_CLINIC_OR_DEPARTMENT_OTHER): Payer: Self-pay

## 2022-10-20 ENCOUNTER — Other Ambulatory Visit: Payer: Self-pay

## 2022-10-20 ENCOUNTER — Other Ambulatory Visit (HOSPITAL_COMMUNITY): Payer: Self-pay

## 2022-10-20 DIAGNOSIS — I10 Essential (primary) hypertension: Secondary | ICD-10-CM

## 2022-10-20 DIAGNOSIS — E669 Obesity, unspecified: Secondary | ICD-10-CM | POA: Diagnosis not present

## 2022-10-20 DIAGNOSIS — E1169 Type 2 diabetes mellitus with other specified complication: Secondary | ICD-10-CM

## 2022-10-20 DIAGNOSIS — E782 Mixed hyperlipidemia: Secondary | ICD-10-CM | POA: Diagnosis not present

## 2022-10-20 LAB — LIPID PANEL
Cholesterol: 127 mg/dL (ref 0–200)
HDL: 38.1 mg/dL — ABNORMAL LOW (ref >39.00)
LDL Cholesterol: 66 mg/dL (ref 0–99)
NonHDL: 88.69
Total CHOL/HDL Ratio: 3
Triglycerides: 114 mg/dL (ref 0.0–149.0)
VLDL: 22.8 mg/dL (ref 0.0–40.0)

## 2022-10-20 LAB — COMPREHENSIVE METABOLIC PANEL
ALT: 23 U/L (ref 0–35)
AST: 19 U/L (ref 0–37)
Albumin: 3.8 g/dL (ref 3.5–5.2)
Alkaline Phosphatase: 73 U/L (ref 39–117)
BUN: 13 mg/dL (ref 6–23)
CO2: 30 mEq/L (ref 19–32)
Calcium: 9.2 mg/dL (ref 8.4–10.5)
Chloride: 105 mEq/L (ref 96–112)
Creatinine, Ser: 0.68 mg/dL (ref 0.40–1.20)
GFR: 94.96 mL/min (ref >60.00)
Glucose, Bld: 118 mg/dL — ABNORMAL HIGH (ref 70–99)
Potassium: 4.2 mEq/L (ref 3.5–5.1)
Sodium: 142 mEq/L (ref 135–145)
Total Bilirubin: 0.3 mg/dL (ref 0.2–1.2)
Total Protein: 6.4 g/dL (ref 6.0–8.3)

## 2022-10-20 LAB — CBC WITH DIFFERENTIAL/PLATELET
Basophils Absolute: 0 10*3/uL (ref 0.0–0.1)
Basophils Relative: 0.4 % (ref 0.0–3.0)
Eosinophils Absolute: 0.3 10*3/uL (ref 0.0–0.7)
Eosinophils Relative: 2.8 % (ref 0.0–5.0)
HCT: 40.4 % (ref 36.0–46.0)
Hemoglobin: 13.4 g/dL (ref 12.0–15.0)
Lymphocytes Relative: 21.7 % (ref 12.0–46.0)
Lymphs Abs: 2 10*3/uL (ref 0.7–4.0)
MCHC: 33 g/dL (ref 30.0–36.0)
MCV: 90.9 fl (ref 78.0–100.0)
Monocytes Absolute: 0.6 10*3/uL (ref 0.1–1.0)
Monocytes Relative: 6.1 % (ref 3.0–12.0)
Neutro Abs: 6.4 10*3/uL (ref 1.4–7.7)
Neutrophils Relative %: 69 % (ref 43.0–77.0)
Platelets: 542 10*3/uL — ABNORMAL HIGH (ref 150.0–400.0)
RBC: 4.45 Mil/uL (ref 3.87–5.11)
RDW: 14.2 % (ref 11.5–15.5)
WBC: 9.3 10*3/uL (ref 4.0–10.5)

## 2022-10-20 LAB — HEMOGLOBIN A1C: Hgb A1c MFr Bld: 6.9 % — ABNORMAL HIGH (ref 4.6–6.5)

## 2022-10-20 LAB — TSH: TSH: 1.58 u[IU]/mL (ref 0.35–5.50)

## 2022-10-20 MED ORDER — COMIRNATY 30 MCG/0.3ML IM SUSY
PREFILLED_SYRINGE | INTRAMUSCULAR | 0 refills | Status: DC
  Filled 2022-10-20: qty 0.3, 1d supply, fill #0

## 2022-10-21 ENCOUNTER — Other Ambulatory Visit (HOSPITAL_COMMUNITY): Payer: Self-pay

## 2022-10-22 ENCOUNTER — Other Ambulatory Visit: Payer: Self-pay | Admitting: Family Medicine

## 2022-10-22 MED ORDER — ARNUITY ELLIPTA 100 MCG/ACT IN AEPB
1.0000 | INHALATION_SPRAY | Freq: Every day | RESPIRATORY_TRACT | 3 refills | Status: DC
  Filled 2022-10-22: qty 30, 30d supply, fill #0
  Filled 2023-01-06: qty 30, 30d supply, fill #1

## 2022-10-23 NOTE — Progress Notes (Signed)
Subjective:    Patient ID: Eileen Richards, female    DOB: November 13, 1960, 62 y.o.   MRN: [id number]  Chief Complaint  Patient presents with   Follow-up    Follow up    HPI Patient is in today for follow up on chronic medical concerns. No recent febrile illness or acute hospitalizations. No complaints of polyuria or polydipsia. She continues to struggle with right hip and right foot pain frequently and this limits her ability to stay active. Tries to maintain a heart healthy diet. Denies CP/palp/SOB/HA/congestion/fevers/GI or GU c/o. Taking meds as prescribed. She had recent URI symptoms but they have resolved.   Past Medical History:  Diagnosis Date   Achilles tendonitis 10/14/2012   Asthma    childhood   Atypical chest pain    normal stress echo 02/2009 -LV size was normal LV global systolic function was normal- Normal wall motion; no LV regional wall motion abnormalities   Benign paroxysmal positional vertigo 04/26/2015   Chest pain, atypical 08/24/2013   Diabetes mellitus    GERD (gastroesophageal reflux disease)    Hand pain, left 11/10/2015   Hyperlipemia    Hypertension    Neuropathic pain of ankle, left 04/27/2017   Onychomycosis 04/17/2014   Pain of right heel 10/14/2012   Preventative health care 11/22/2015   Sinusitis, acute 01/17/2016   Thoracic outlet syndrome 11/03/2014   Urinary urgency 12/15/2015    Past Surgical History:  Procedure Laterality Date   ABDOMINAL HYSTERECTOMY     CYSTOSCOPY  07/23/2012   Procedure: CYSTOSCOPY;  Surgeon: Marvene Staff, MD;  Location: [facility];  Service: Gynecology;  Laterality: N/A;   DILATION AND CURETTAGE OF UTERUS     Lysis of Adhesions   ROBOTIC ASSISTED LAPAROSCOPIC LYSIS OF ADHESION  07/23/2012   Procedure: ROBOTIC ASSISTED LAPAROSCOPIC LYSIS OF ADHESION;  Surgeon: Marvene Staff, MD;  Location: [facility];  Service: Gynecology;  Laterality: N/A;   ROBOTIC ASSISTED SALPINGO OOPHERECTOMY  07/23/2012   Procedure: ROBOTIC ASSISTED  SALPINGO OOPHERECTOMY;  Surgeon: Marvene Staff, MD;  Location: [facility];  Service: Gynecology;  Laterality: Bilateral;    Family History  Problem Relation Age of Onset   GER disease Mother    GER disease Father    Heart disease Father        father has "heart problems"   Anemia Father    Asthma Other    Hypertension Other    Diabetes Other     Social History   Socioeconomic History   Marital status: Single    Spouse name: Not on file   Number of children: Not on file   Years of education: Not on file   Highest education level: Not on file  Occupational History   Not on file  Tobacco Use   Smoking status: Former    Packs/day: 0.50    Types: Cigarettes    Quit date: 02/17/2014    Years since quitting: 8.6   Smokeless tobacco: Never  Vaping Use   Vaping Use: Never used  Substance and Sexual Activity   Alcohol use: No   Drug use: No   Sexual activity: Not on file  Other Topics Concern   Not on file  Social History Narrative   Not on file   Social Determinants of Health   Financial Resource Strain: Not on file  Food Insecurity: Not on file  Transportation Needs: Not on file  Physical Activity: Not on file  Stress: Not on file  Social Connections:  Not on file  Intimate Partner Violence: Not on file    Outpatient Medications Prior to Visit  Medication Sig Dispense Refill   Accu-Chek FastClix Lancets MISC USE AS DIRECTED TWICE DAILY TO CHECK BLOOD SUGAR. DX E11.9 (Patient taking differently: 1 each by Other route See admin instructions. Use as directed twice daily to check blood sugar.  DX E11.9) 102 each 6   aspirin 81 MG tablet Take 81 mg by mouth daily.     blood glucose meter kit and supplies KIT Dispense based on patient and insurance preference. Use up to four times daily as directed. (FOR ICD-9 250.00, 250.01). (Patient taking differently: Inject 1 each into the skin as directed. Dispense based on patient and insurance preference. Use up to four times daily  as directed. (FOR ICD-9 250.00, 250.01).) 1 each 0   Blood Glucose Monitoring Suppl (FREESTYLE FREEDOM LITE) w/Device KIT Use to check sugar twice a day.  Dx Code: E11.9 (Patient taking differently: 1 each by Other route See admin instructions. Use to check sugar twice a day.  Dx Code: E11.9) 1 kit 0   carvedilol (COREG) 3.125 MG tablet Take 1 tablet (3.125 mg total) by mouth 2 (two) times daily with a meal 180 tablet 1   cetirizine (ZYRTEC) 10 MG tablet TAKE 1 TABLET BY MOUTH 2 TIMES DAILY. (Patient taking differently: Take 10 mg by mouth 2 (two) times daily.) 200 tablet 1   Continuous Blood Gluc Receiver (FREESTYLE LIBRE 2 READER) DEVI USE AS DIRECTED. (Patient taking differently: 1 each by Other route See admin instructions. Glucose reading) 1 each 1   Continuous Blood Gluc Sensor (FREESTYLE LIBRE 2 SENSOR) MISC USE AS DIRECTED EVERY 14 DAYS. 6 each 3   diclofenac Sodium (VOLTAREN) 1 % GEL Apply 2 g topically 4 (four) times daily. Rub into affected area of foot 2 to 4 times daily (Patient taking differently: Apply 2 g topically as needed (Joint pain). Rub into affected area of foot 2 to 4 times daily) 100 g 2   famotidine (PEPCID) 20 MG tablet Take 1 tablet (20 mg total) by mouth 2 (two) times daily. 180 tablet 3   hydrochlorothiazide (HYDRODIURIL) 25 MG tablet Take 1 tablet (25 mg total) by mouth daily. 90 tablet 1   insulin glargine-yfgn (SEMGLEE, YFGN,) 100 UNIT/ML Pen Inject 48 Units into the skin every morning. 60 mL 1   Insulin Pen Needle 32G X 4 MM MISC Use once daily 100 each 3   Iron-FA-B Cmp-C-Biot-Probiotic (FUSION PLUS) CAPS Take 1 capsule by mouth daily. 30 capsule 11   meloxicam (MOBIC) 15 MG tablet Take 1 tablet (15 mg total) by mouth daily. 30 tablet 0   metFORMIN (GLUCOPHAGE-XR) 500 MG 24 hr tablet Take 4 tablets (2,000 mg total) by mouth daily. 360 tablet 3   nitroGLYCERIN (NITROSTAT) 0.4 MG SL tablet Place 1 tablet (0.4 mg total) under the tongue every 5 (five) minutes x 3 doses  as needed for chest pain. 40 tablet 1   pantoprazole (PROTONIX) 40 MG tablet Take 1 tablet (40 mg total) by mouth daily. 90 tablet 1   potassium chloride SA (KLOR-CON M) 20 MEQ tablet Take 1 tablet (20 mEq total) by mouth daily. 90 tablet 1   promethazine (PHENERGAN) 25 MG tablet Take 1 tablet (25 mg total) by mouth every 8 (eight) hours as needed for nausea or vomiting. 20 tablet 0   rosuvastatin (CRESTOR) 20 MG tablet Take 1 tablet (20 mg total) by mouth daily. 90 tablet 3  tiZANidine (ZANAFLEX) 2 MG tablet Take 0.5-2 tablets (1-4 mg total) by mouth 2 (two) times daily as needed for muscle spasms. 30 tablet 1   traMADol (ULTRAM) 50 MG tablet Take 1 tablet (50 mg total) by mouth 3 (three) times daily as needed. 90 tablet 0   albuterol (VENTOLIN HFA) 108 (90 Base) MCG/ACT inhaler Inhale 2 puffs into the lungs every 6 (six) hours as needed for wheezing or shortness of breath. 6.7 g 5   gabapentin (NEURONTIN) 300 MG capsule Take 1 capsule (300 mg total) by mouth 3 (three) times daily. (Patient taking differently: Take 300 mg by mouth as needed (Nerve pain).) 90 capsule 1   Semaglutide, 2 MG/DOSE, (OZEMPIC, 2 MG/DOSE,) 8 MG/3ML SOPN Inject 2 mg into the skin once a week. 9 mL 1   No facility-administered medications prior to visit.    Allergies  Allergen Reactions   Farxiga [Dapagliflozin] Hives    Review of Systems  Constitutional:  Positive for malaise/fatigue. Negative for fever.  HENT:  Negative for congestion.   Eyes:  Negative for blurred vision.  Respiratory:  Negative for shortness of breath.   Cardiovascular:  Negative for chest pain, palpitations and leg swelling.  Gastrointestinal:  Negative for abdominal pain, blood in stool and nausea.  Genitourinary:  Negative for dysuria and frequency.  Musculoskeletal:  Positive for joint pain. Negative for falls.  Skin:  Negative for rash.  Neurological:  Negative for dizziness, loss of consciousness and headaches.  Endo/Heme/Allergies:   Negative for environmental allergies.  Psychiatric/Behavioral:  Negative for depression. The patient is not nervous/anxious.        Objective:    Physical Exam Constitutional:      General: She is not in acute distress.    Appearance: Normal appearance. She is well-developed. She is not toxic-appearing.  HENT:     Head: Normocephalic and atraumatic.     Right Ear: External ear normal.     Left Ear: External ear normal.     Nose: Nose normal.  Eyes:     General:        Right eye: No discharge.        Left eye: No discharge.     Conjunctiva/sclera: Conjunctivae normal.  Neck:     Thyroid: No thyromegaly.  Cardiovascular:     Rate and Rhythm: Normal rate and regular rhythm.     Heart sounds: Normal heart sounds. No murmur heard. Pulmonary:     Effort: Pulmonary effort is normal. No respiratory distress.     Breath sounds: Normal breath sounds.  Abdominal:     General: Bowel sounds are normal.     Palpations: Abdomen is soft.     Tenderness: There is no abdominal tenderness. There is no guarding.  Musculoskeletal:        General: Normal range of motion.     Cervical back: Neck supple.  Lymphadenopathy:     Cervical: No cervical adenopathy.  Skin:    General: Skin is warm and dry.  Neurological:     Mental Status: She is alert and oriented to person, place, and time.  Psychiatric:        Mood and Affect: Mood normal.        Behavior: Behavior normal.        Thought Content: Thought content normal.        Judgment: Judgment normal.     BP 136/80 (BP Location: Right Arm, Patient Position: Sitting, Cuff Size: Normal)   Pulse 89  Temp 98 F (36.7 C) (Oral)   Resp 16   Ht '5\' 4"'$  (1.626 m)   Wt 229 lb (103.9 kg)   SpO2 98%   BMI 39.31 kg/m  Wt Readings from Last 3 Encounters:  10/17/22 229 lb (103.9 kg)  06/16/22 238 lb 9.6 oz (108.2 kg)  03/15/22 238 lb (108 kg)    Diabetic Foot Exam - Simple   No data filed    Lab Results  Component Value Date   WBC 9.3  10/20/2022   HGB 13.4 10/20/2022   HCT 40.4 10/20/2022   PLT 542.0 (H) 10/20/2022   GLUCOSE 118 (H) 10/20/2022   CHOL 127 10/20/2022   TRIG 114.0 10/20/2022   HDL 38.10 (L) 10/20/2022   LDLDIRECT 100.0 08/03/2017   LDLCALC 66 10/20/2022   ALT 23 10/20/2022   AST 19 10/20/2022   NA 142 10/20/2022   K 4.2 10/20/2022   CL 105 10/20/2022   CREATININE 0.68 10/20/2022   BUN 13 10/20/2022   CO2 30 10/20/2022   TSH 1.58 10/20/2022   HGBA1C 6.9 (H) 10/20/2022   MICROALBUR 1.0 03/08/2022    Lab Results  Component Value Date   TSH 1.58 10/20/2022   Lab Results  Component Value Date   WBC 9.3 10/20/2022   HGB 13.4 10/20/2022   HCT 40.4 10/20/2022   MCV 90.9 10/20/2022   PLT 542.0 (H) 10/20/2022   Lab Results  Component Value Date   NA 142 10/20/2022   K 4.2 10/20/2022   CO2 30 10/20/2022   GLUCOSE 118 (H) 10/20/2022   BUN 13 10/20/2022   CREATININE 0.68 10/20/2022   BILITOT 0.3 10/20/2022   ALKPHOS 73 10/20/2022   AST 19 10/20/2022   ALT 23 10/20/2022   PROT 6.4 10/20/2022   ALBUMIN 3.8 10/20/2022   CALCIUM 9.2 10/20/2022   ANIONGAP 6 03/15/2022   GFR 94.96 10/20/2022   Lab Results  Component Value Date   CHOL 127 10/20/2022   Lab Results  Component Value Date   HDL 38.10 (L) 10/20/2022   Lab Results  Component Value Date   LDLCALC 66 10/20/2022   Lab Results  Component Value Date   TRIG 114.0 10/20/2022   Lab Results  Component Value Date   CHOLHDL 3 10/20/2022   Lab Results  Component Value Date   HGBA1C 6.9 (H) 10/20/2022       Assessment & Plan:  Diabetes mellitus type 2 in obese [facility]) Assessment & Plan: hgba1c acceptable, minimize simple carbs. Increase exercise as tolerated. Continue current meds  Orders: -     Hemoglobin A1c; Future  Essential hypertension Assessment & Plan: Well controlled, no changes to meds. Encouraged heart healthy diet such as the DASH diet and exercise as tolerated.   Orders: -     CBC with  Differential/Platelet; Future -     Comprehensive metabolic panel; Future -     TSH; Future  Hyperlipidemia, mixed Assessment & Plan: Encourage heart healthy diet such as MIND or DASH diet, increase exercise, avoid trans fats, simple carbohydrates and processed foods, consider a krill or fish or flaxseed oil cap daily. Tolerating statins  Orders: -     Lipid panel; Future  Leukocytosis, unspecified type Assessment & Plan: Mild and asymptomatic at last check, repeat labs today   Obesity due to excess calories, unspecified classification, unspecified whether serious comorbidity present Assessment & Plan: Encouraged DASH or MIND diet, decrease po intake and increase exercise as tolerated. Needs 7-8 hours of sleep nightly.  Avoid trans fats, eat small, frequent meals every 4-5 hours with lean proteins, complex carbs and healthy fats. Minimize simple carbs, high fat foods and processed foods    Myalgia Assessment & Plan: Hydrate and monitor, continue tramadol prn can take up to 3 tabs a day.    Hip pain -     Gabapentin; Take 1-2 capsules (300-600 mg total) by mouth 3 (three) times daily.  Dispense: 180 capsule; Refill: 3  Right foot pain Assessment & Plan: Encouraged moist heat and gentle stretching as tolerated. May try NSAIDs and prescription meds as directed and report if symptoms worsen or seek immediate care, try ice and stretching replace foot wear every 6 months  Orders: -     Gabapentin; Take 1-2 capsules (300-600 mg total) by mouth 3 (three) times daily.  Dispense: 180 capsule; Refill: 3  Right hip pain Assessment & Plan: Encouraged moist heat and gentle stretching as tolerated. May try NSAIDs and prescription meds as directed and report if symptoms worsen or seek immediate care    Other orders -     Albuterol Sulfate HFA; Inhale 2 puffs into the lungs every 6 (six) hours as needed for wheezing or shortness of breath.  Dispense: 6.7 g; Refill: 5    Penni Homans, MD

## 2022-10-23 NOTE — Assessment & Plan Note (Signed)
Encouraged moist heat and gentle stretching as tolerated. May try NSAIDs and prescription meds as directed and report if symptoms worsen or seek immediate care 

## 2022-10-23 NOTE — Assessment & Plan Note (Signed)
Encouraged moist heat and gentle stretching as tolerated. May try NSAIDs and prescription meds as directed and report if symptoms worsen or seek immediate care, try ice and stretching replace foot wear every 6 months

## 2022-10-24 ENCOUNTER — Other Ambulatory Visit (HOSPITAL_COMMUNITY): Payer: Self-pay

## 2022-10-24 ENCOUNTER — Other Ambulatory Visit: Payer: Self-pay

## 2022-11-01 ENCOUNTER — Other Ambulatory Visit: Payer: Commercial Managed Care - PPO

## 2022-11-01 DIAGNOSIS — Z01419 Encounter for gynecological examination (general) (routine) without abnormal findings: Secondary | ICD-10-CM | POA: Diagnosis not present

## 2022-11-01 DIAGNOSIS — L80 Vitiligo: Secondary | ICD-10-CM | POA: Diagnosis not present

## 2022-11-01 DIAGNOSIS — Z90710 Acquired absence of both cervix and uterus: Secondary | ICD-10-CM | POA: Diagnosis not present

## 2022-11-01 DIAGNOSIS — Z9079 Acquired absence of other genital organ(s): Secondary | ICD-10-CM | POA: Diagnosis not present

## 2022-11-01 DIAGNOSIS — E119 Type 2 diabetes mellitus without complications: Secondary | ICD-10-CM | POA: Diagnosis not present

## 2022-11-01 DIAGNOSIS — E785 Hyperlipidemia, unspecified: Secondary | ICD-10-CM | POA: Diagnosis not present

## 2022-11-01 DIAGNOSIS — I10 Essential (primary) hypertension: Secondary | ICD-10-CM | POA: Diagnosis not present

## 2022-11-15 ENCOUNTER — Other Ambulatory Visit: Payer: Self-pay | Admitting: Cardiology

## 2022-11-15 ENCOUNTER — Other Ambulatory Visit: Payer: Self-pay

## 2022-11-15 ENCOUNTER — Other Ambulatory Visit (HOSPITAL_COMMUNITY): Payer: Self-pay

## 2022-11-15 MED ORDER — ROSUVASTATIN CALCIUM 20 MG PO TABS
20.0000 mg | ORAL_TABLET | Freq: Every day | ORAL | 0 refills | Status: DC
  Filled 2022-11-15: qty 30, 30d supply, fill #0

## 2022-11-22 ENCOUNTER — Other Ambulatory Visit (HOSPITAL_COMMUNITY): Payer: Self-pay

## 2022-11-22 DIAGNOSIS — R921 Mammographic calcification found on diagnostic imaging of breast: Secondary | ICD-10-CM | POA: Diagnosis not present

## 2022-11-22 DIAGNOSIS — R922 Inconclusive mammogram: Secondary | ICD-10-CM | POA: Diagnosis not present

## 2022-11-22 DIAGNOSIS — R928 Other abnormal and inconclusive findings on diagnostic imaging of breast: Secondary | ICD-10-CM | POA: Diagnosis not present

## 2022-11-22 LAB — HM MAMMOGRAPHY

## 2022-11-23 ENCOUNTER — Encounter: Payer: Self-pay | Admitting: Family Medicine

## 2022-11-29 ENCOUNTER — Other Ambulatory Visit: Payer: Self-pay

## 2022-11-29 ENCOUNTER — Other Ambulatory Visit (HOSPITAL_COMMUNITY): Payer: Self-pay

## 2022-12-01 ENCOUNTER — Other Ambulatory Visit: Payer: Self-pay | Admitting: Family Medicine

## 2022-12-01 ENCOUNTER — Other Ambulatory Visit (HOSPITAL_COMMUNITY): Payer: Self-pay

## 2022-12-01 ENCOUNTER — Other Ambulatory Visit (HOSPITAL_BASED_OUTPATIENT_CLINIC_OR_DEPARTMENT_OTHER): Payer: Self-pay

## 2022-12-04 ENCOUNTER — Encounter: Payer: Self-pay | Admitting: Family Medicine

## 2022-12-05 ENCOUNTER — Other Ambulatory Visit: Payer: Self-pay

## 2022-12-05 ENCOUNTER — Other Ambulatory Visit (HOSPITAL_COMMUNITY): Payer: Self-pay

## 2022-12-05 MED ORDER — FREESTYLE LIBRE 2 SENSOR MISC
3 refills | Status: DC
  Filled 2022-12-05: qty 2, 28d supply, fill #0
  Filled 2023-01-06: qty 2, 28d supply, fill #1
  Filled 2023-02-03: qty 2, 28d supply, fill #2
  Filled 2023-03-13: qty 2, 28d supply, fill #3

## 2022-12-12 ENCOUNTER — Other Ambulatory Visit (HOSPITAL_COMMUNITY): Payer: Self-pay

## 2022-12-20 ENCOUNTER — Telehealth: Payer: Self-pay | Admitting: Family Medicine

## 2022-12-20 NOTE — Telephone Encounter (Signed)
Form completed and mailed to pt  Address.

## 2022-12-20 NOTE — Telephone Encounter (Signed)
Pt dropped off document to be filled out by provider ([employer] 1 page) Pt would like to have document mailed to her home address ([address], [place], [place] [address])  when document done. Document put  at front office tray under providers name.

## 2023-01-04 ENCOUNTER — Other Ambulatory Visit: Payer: Self-pay

## 2023-01-05 ENCOUNTER — Other Ambulatory Visit (HOSPITAL_COMMUNITY): Payer: Self-pay

## 2023-01-06 ENCOUNTER — Other Ambulatory Visit (HOSPITAL_COMMUNITY): Payer: Self-pay

## 2023-01-06 ENCOUNTER — Other Ambulatory Visit: Payer: Self-pay | Admitting: Cardiology

## 2023-01-06 ENCOUNTER — Other Ambulatory Visit: Payer: Self-pay

## 2023-01-06 MED ORDER — ROSUVASTATIN CALCIUM 20 MG PO TABS
20.0000 mg | ORAL_TABLET | Freq: Every day | ORAL | 0 refills | Status: DC
  Filled 2023-01-06: qty 30, 30d supply, fill #0

## 2023-01-06 NOTE — Telephone Encounter (Signed)
Refill for 30 tablets only, patient needs appointment for future refills, 2nd attempt

## 2023-01-09 ENCOUNTER — Other Ambulatory Visit: Payer: Self-pay

## 2023-01-18 NOTE — Assessment & Plan Note (Signed)
Encourage heart healthy diet such as MIND or DASH diet, increase exercise, avoid trans fats, simple carbohydrates and processed foods, consider a krill or fish or flaxseed oil cap daily. Tolerating statins 

## 2023-01-18 NOTE — Assessment & Plan Note (Signed)
Well controlled, no changes to meds. Encouraged heart healthy diet such as the DASH diet and exercise as tolerated.  °

## 2023-01-18 NOTE — Assessment & Plan Note (Signed)
Avoid offending foods, start probiotics. Do not eat large meals in late evening and consider raising head of bed.  

## 2023-01-18 NOTE — Assessment & Plan Note (Signed)
Hydrate and monitor 

## 2023-01-18 NOTE — Assessment & Plan Note (Signed)
hgba1c acceptable, minimize simple carbs. Increase exercise as tolerated. Continue current meds 

## 2023-01-19 ENCOUNTER — Other Ambulatory Visit (HOSPITAL_COMMUNITY): Payer: Self-pay

## 2023-01-19 ENCOUNTER — Ambulatory Visit: Payer: Commercial Managed Care - PPO | Admitting: Family Medicine

## 2023-01-19 VITALS — BP 130/84 | HR 82 | Temp 97.4°F | Resp 16 | Ht 64.0 in | Wt 234.0 lb

## 2023-01-19 DIAGNOSIS — E669 Obesity, unspecified: Secondary | ICD-10-CM | POA: Diagnosis not present

## 2023-01-19 DIAGNOSIS — J029 Acute pharyngitis, unspecified: Secondary | ICD-10-CM | POA: Diagnosis not present

## 2023-01-19 DIAGNOSIS — I10 Essential (primary) hypertension: Secondary | ICD-10-CM

## 2023-01-19 DIAGNOSIS — Z79899 Other long term (current) drug therapy: Secondary | ICD-10-CM | POA: Diagnosis not present

## 2023-01-19 DIAGNOSIS — R700 Elevated erythrocyte sedimentation rate: Secondary | ICD-10-CM | POA: Diagnosis not present

## 2023-01-19 DIAGNOSIS — Z7985 Long-term (current) use of injectable non-insulin antidiabetic drugs: Secondary | ICD-10-CM | POA: Diagnosis not present

## 2023-01-19 DIAGNOSIS — K219 Gastro-esophageal reflux disease without esophagitis: Secondary | ICD-10-CM

## 2023-01-19 DIAGNOSIS — E1169 Type 2 diabetes mellitus with other specified complication: Secondary | ICD-10-CM

## 2023-01-19 DIAGNOSIS — Z7984 Long term (current) use of oral hypoglycemic drugs: Secondary | ICD-10-CM | POA: Diagnosis not present

## 2023-01-19 DIAGNOSIS — E119 Type 2 diabetes mellitus without complications: Secondary | ICD-10-CM

## 2023-01-19 DIAGNOSIS — E782 Mixed hyperlipidemia: Secondary | ICD-10-CM | POA: Diagnosis not present

## 2023-01-19 DIAGNOSIS — M791 Myalgia, unspecified site: Secondary | ICD-10-CM | POA: Diagnosis not present

## 2023-01-19 DIAGNOSIS — M25561 Pain in right knee: Secondary | ICD-10-CM

## 2023-01-19 DIAGNOSIS — M25551 Pain in right hip: Secondary | ICD-10-CM

## 2023-01-19 DIAGNOSIS — Z794 Long term (current) use of insulin: Secondary | ICD-10-CM

## 2023-01-19 MED ORDER — TIZANIDINE HCL 2 MG PO TABS
1.0000 mg | ORAL_TABLET | Freq: Two times a day (BID) | ORAL | 1 refills | Status: AC | PRN
  Filled 2023-01-19: qty 30, 8d supply, fill #0

## 2023-01-19 MED ORDER — CEFDINIR 300 MG PO CAPS
300.0000 mg | ORAL_CAPSULE | Freq: Two times a day (BID) | ORAL | 0 refills | Status: DC
  Filled 2023-01-19: qty 14, 7d supply, fill #0

## 2023-01-19 NOTE — Progress Notes (Addendum)
Subjective:   By signing my name below, I, Vickey Sages, attest that this documentation has been prepared under the direction and in the presence of Bradd Canary, MD., 01/19/2023.   Patient ID: Eileen Richards, female    DOB: [birth year], 62 y.o.   MRN: [id number]  Chief Complaint  Patient presents with   Follow-up    Follow up   HPI Patient is in today for an office visit.  Cough/Sore Throat Patient presents today with sore throat and cough that started yesterday morning. She suspects this is due to her allergies and has not taken anything to manage this. She denies CP/palpitations/SOB/HA/fever/chills/GI or GU symptoms.   Diabetes Mellitus Type 2 Patient reports that she has been using a continuous blood glucose sensor but the measurements have been fluctuating and inaccurate. Additionally, the sensor has caused her upper right arm to become erythematic and itchy.  Lab Results  Component Value Date   HGBA1C 7.1 (H) 01/19/2023   Hypertension Patient's hypertension is well-controlled and her blood pressure and pulse are normal today. BP Readings from Last 3 Encounters:  01/19/23 130/84  10/17/22 136/80  06/16/22 128/84   Pulse Readings from Last 3 Encounters:  01/19/23 82  10/17/22 89  06/16/22 79   Right Knee Pain Patient complains of worsening pain that originates in the right hip and radiates down to the right knee. She states the pain spikes when laying down is making it difficult to walk. She is interested in following with an x-ray of the right hip and right knee and starting a muscle relaxer.  Past Medical History:  Diagnosis Date   Achilles tendonitis 10/14/2012   Asthma    childhood   Atypical chest pain    normal stress echo 02/2009 -LV size was normal LV global systolic function was normal- Normal wall motion; no LV regional wall motion abnormalities   Benign paroxysmal positional vertigo 04/26/2015   Chest pain, atypical 08/24/2013   Diabetes mellitus     GERD (gastroesophageal reflux disease)    Hand pain, left 11/10/2015   Hyperlipemia    Hypertension    Neuropathic pain of ankle, left 04/27/2017   Onychomycosis 04/17/2014   Pain of right heel 10/14/2012   Preventative health care 11/22/2015   Sinusitis, acute 01/17/2016   Thoracic outlet syndrome 11/03/2014   Urinary urgency 12/15/2015    Past Surgical History:  Procedure Laterality Date   ABDOMINAL HYSTERECTOMY     CYSTOSCOPY  07/23/2012   Procedure: CYSTOSCOPY;  Surgeon: Serita Kyle, MD;  Location: [facility];  Service: Gynecology;  Laterality: N/A;   DILATION AND CURETTAGE OF UTERUS     Lysis of Adhesions   ROBOTIC ASSISTED LAPAROSCOPIC LYSIS OF ADHESION  07/23/2012   Procedure: ROBOTIC ASSISTED LAPAROSCOPIC LYSIS OF ADHESION;  Surgeon: Serita Kyle, MD;  Location: [facility];  Service: Gynecology;  Laterality: N/A;   ROBOTIC ASSISTED SALPINGO OOPHERECTOMY  07/23/2012   Procedure: ROBOTIC ASSISTED SALPINGO OOPHERECTOMY;  Surgeon: Serita Kyle, MD;  Location: [facility];  Service: Gynecology;  Laterality: Bilateral;    Family History  Problem Relation Age of Onset   GER disease Mother    GER disease Father    Heart disease Father        father has "heart problems"   Anemia Father    Asthma Other    Hypertension Other    Diabetes Other     Social History   Socioeconomic History   Marital status: Single    Spouse  name: Not on file   Number of children: Not on file   Years of education: Not on file   Highest education level: Not on file  Occupational History   Not on file  Tobacco Use   Smoking status: Former    Packs/day: .5    Types: Cigarettes    Quit date: 02/17/2014    Years since quitting: 8.9   Smokeless tobacco: Never  Vaping Use   Vaping Use: Never used  Substance and Sexual Activity   Alcohol use: No   Drug use: No   Sexual activity: Not on file  Other Topics Concern   Not on file  Social History Narrative   Not on file   Social Determinants  of Health   Financial Resource Strain: Not on file  Food Insecurity: Not on file  Transportation Needs: Not on file  Physical Activity: Not on file  Stress: Not on file  Social Connections: Not on file  Intimate Partner Violence: Not on file    Outpatient Medications Prior to Visit  Medication Sig Dispense Refill   Accu-Chek FastClix Lancets MISC USE AS DIRECTED TWICE DAILY TO CHECK BLOOD SUGAR. DX E11.9 (Patient taking differently: 1 each by Other route See admin instructions. Use as directed twice daily to check blood sugar.  DX E11.9) 102 each 6   albuterol (VENTOLIN HFA) 108 (90 Base) MCG/ACT inhaler Inhale 2 puffs into the lungs every 6 (six) hours as needed for wheezing or shortness of breath. 6.7 g 5   aspirin 81 MG tablet Take 81 mg by mouth daily.     carvedilol (COREG) 3.125 MG tablet Take 1 tablet (3.125 mg total) by mouth 2 (two) times daily with a meal 180 tablet 1   cetirizine (ZYRTEC) 10 MG tablet TAKE 1 TABLET BY MOUTH 2 TIMES DAILY. (Patient taking differently: Take 10 mg by mouth 2 (two) times daily.) 200 tablet 1   Continuous Blood Gluc Receiver (FREESTYLE LIBRE 2 READER) DEVI USE AS DIRECTED. (Patient taking differently: 1 each by Other route See admin instructions. Glucose reading) 1 each 1   Continuous Blood Gluc Sensor (FREESTYLE LIBRE 2 SENSOR) MISC Use as directed 2 each 3   COVID-19 mRNA vaccine 2023-2024 (COMIRNATY) syringe Inject into the muscle. 0.3 mL 0   diclofenac Sodium (VOLTAREN) 1 % GEL Apply 2 g topically 4 (four) times daily. Rub into affected area of foot 2 to 4 times daily (Patient taking differently: Apply 2 g topically as needed (Joint pain). Rub into affected area of foot 2 to 4 times daily) 100 g 2   famotidine (PEPCID) 20 MG tablet Take 1 tablet (20 mg total) by mouth 2 (two) times daily. 180 tablet 3   Fluticasone Furoate (ARNUITY ELLIPTA) 100 MCG/ACT AEPB Inhale 1 puff into the lungs daily. 30 each 3   gabapentin (NEURONTIN) 300 MG capsule Take  1-2 capsules (300-600 mg total) by mouth 3 (three) times daily. 180 capsule 3   hydrochlorothiazide (HYDRODIURIL) 25 MG tablet Take 1 tablet (25 mg total) by mouth daily. 90 tablet 1   insulin glargine-yfgn (SEMGLEE, YFGN,) 100 UNIT/ML Pen Inject 48 Units into the skin every morning. 60 mL 1   Insulin Pen Needle 32G X 4 MM MISC Use once daily 100 each 3   Iron-FA-B Cmp-C-Biot-Probiotic (FUSION PLUS) CAPS Take 1 capsule by mouth daily. 30 capsule 11   meloxicam (MOBIC) 15 MG tablet Take 1 tablet (15 mg total) by mouth daily. 30 tablet 0   metFORMIN (GLUCOPHAGE-XR)  500 MG 24 hr tablet Take 4 tablets (2,000 mg total) by mouth daily. 360 tablet 3   nitroGLYCERIN (NITROSTAT) 0.4 MG SL tablet Place 1 tablet (0.4 mg total) under the tongue every 5 (five) minutes x 3 doses as needed for chest pain. 40 tablet 1   pantoprazole (PROTONIX) 40 MG tablet Take 1 tablet (40 mg total) by mouth daily. 90 tablet 1   potassium chloride SA (KLOR-CON M) 20 MEQ tablet Take 1 tablet (20 mEq total) by mouth daily. 90 tablet 1   promethazine (PHENERGAN) 25 MG tablet Take 1 tablet (25 mg total) by mouth every 8 (eight) hours as needed for nausea or vomiting. 20 tablet 0   rosuvastatin (CRESTOR) 20 MG tablet Take 1 tablet (20 mg total) by mouth daily. 30 tablet 0   Semaglutide, 2 MG/DOSE, (OZEMPIC, 2 MG/DOSE,) 8 MG/3ML SOPN Inject 2 mg into the skin once a week. 9 mL 1   traMADol (ULTRAM) 50 MG tablet Take 1 tablet (50 mg total) by mouth 3 (three) times daily as needed. 90 tablet 0   tiZANidine (ZANAFLEX) 2 MG tablet Take 0.5-2 tablets (1-4 mg total) by mouth 2 (two) times daily as needed for muscle spasms. 30 tablet 1   No facility-administered medications prior to visit.    Allergies  Allergen Reactions   Farxiga [Dapagliflozin] Hives    Review of Systems  Constitutional:  Negative for chills and fever.  HENT:  Positive for sore throat.   Respiratory:  Positive for cough. Negative for shortness of breath.    Cardiovascular:  Negative for chest pain and palpitations.  Gastrointestinal:  Negative for abdominal pain, blood in stool, constipation, diarrhea, nausea and vomiting.  Genitourinary:  Negative for dysuria, frequency, hematuria and urgency.  Skin:           Neurological:  Negative for headaches.       Objective:    Physical Exam Constitutional:      General: She is not in acute distress.    Appearance: Normal appearance. She is not ill-appearing.  HENT:     Head: Normocephalic and atraumatic.     Right Ear: External ear normal.     Left Ear: External ear normal.     Nose: Nose normal.     Mouth/Throat:     Mouth: Mucous membranes are moist.     Pharynx: Oropharynx is clear. Posterior oropharyngeal erythema present. No pharyngeal swelling.  Eyes:     General:        Right eye: No discharge.        Left eye: No discharge.     Extraocular Movements: Extraocular movements intact.     Conjunctiva/sclera: Conjunctivae normal.     Pupils: Pupils are equal, round, and reactive to light.  Cardiovascular:     Rate and Rhythm: Normal rate and regular rhythm.     Pulses: Normal pulses.     Heart sounds: Normal heart sounds. No murmur heard.    No gallop.  Pulmonary:     Effort: Pulmonary effort is normal. No respiratory distress.     Breath sounds: Normal breath sounds. No wheezing or rales.  Abdominal:     General: Bowel sounds are normal.     Palpations: Abdomen is soft.     Tenderness: There is no abdominal tenderness. There is no guarding.  Musculoskeletal:        General: Normal range of motion.     Cervical back: Normal range of motion.  Right lower leg: No edema.     Left lower leg: No edema.  Skin:    General: Skin is warm and dry.     Comments: Patient's continuous glucose sensor has caused a circular erythematic patch on the upper right arm.  Neurological:     Mental Status: She is alert and oriented to person, place, and time.  Psychiatric:        Mood and  Affect: Mood normal.        Behavior: Behavior normal.        Judgment: Judgment normal.     BP 130/84 (BP Location: Right Arm, Patient Position: Sitting, Cuff Size: Normal)   Pulse 82   Temp (!) 97.4 F (36.3 C) (Oral)   Resp 16   Ht 5\' 4"  (1.626 m)   Wt 234 lb (106.1 kg)   SpO2 96%   BMI 40.17 kg/m  Wt Readings from Last 3 Encounters:  01/19/23 234 lb (106.1 kg)  10/17/22 229 lb (103.9 kg)  06/16/22 238 lb 9.6 oz (108.2 kg)    Diabetic Foot Exam - Simple   No data filed    Lab Results  Component Value Date   WBC 12.7 (H) 01/19/2023   HGB 13.9 01/19/2023   HCT 42.2 01/19/2023   PLT 528.0 (H) 01/19/2023   GLUCOSE 93 01/19/2023   CHOL 138 01/19/2023   TRIG 109.0 01/19/2023   HDL 44.80 01/19/2023   LDLDIRECT 100.0 08/03/2017   LDLCALC 71 01/19/2023   ALT 32 01/19/2023   AST 32 01/19/2023   NA 140 01/19/2023   K 4.2 01/19/2023   CL 99 01/19/2023   CREATININE 0.85 01/19/2023   BUN 14 01/19/2023   CO2 34 (H) 01/19/2023   TSH 1.73 01/19/2023   HGBA1C 7.1 (H) 01/19/2023   MICROALBUR <0.7 01/19/2023    Lab Results  Component Value Date   TSH 1.73 01/19/2023   Lab Results  Component Value Date   WBC 12.7 (H) 01/19/2023   HGB 13.9 01/19/2023   HCT 42.2 01/19/2023   MCV 91.7 01/19/2023   PLT 528.0 (H) 01/19/2023   Lab Results  Component Value Date   NA 140 01/19/2023   K 4.2 01/19/2023   CO2 34 (H) 01/19/2023   GLUCOSE 93 01/19/2023   BUN 14 01/19/2023   CREATININE 0.85 01/19/2023   BILITOT 0.3 01/19/2023   ALKPHOS 87 01/19/2023   AST 32 01/19/2023   ALT 32 01/19/2023   PROT 7.8 01/19/2023   ALBUMIN 4.2 01/19/2023   CALCIUM 9.9 01/19/2023   ANIONGAP 6 03/15/2022   GFR 74.57 01/19/2023   Lab Results  Component Value Date   CHOL 138 01/19/2023   Lab Results  Component Value Date   HDL 44.80 01/19/2023   Lab Results  Component Value Date   LDLCALC 71 01/19/2023   Lab Results  Component Value Date   TRIG 109.0 01/19/2023   Lab Results   Component Value Date   CHOLHDL 3 01/19/2023   Lab Results  Component Value Date   HGBA1C 7.1 (H) 01/19/2023      Assessment & Plan:  Hypertension: Hypertension is well-controlled and there were no medication adjustments today.  Immunizations: Encouraged patient to consider annual COVID-19 and Influenza vaccinations as well as RSV vaccination.  Labs: Routine blood work ordered today.  Cellulitis: Recommended as-needed use of neosporin and witch-hazel to reduce redness caused by glucose sensor.   Cough/Sore Throat: Prescribed Cefdinir 300 mg.  Right Knee Pain: X-ray of the right  hip and right knee ordered. Prescribed Tizanidine 2 mg.  Problem List Items Addressed This Visit     Right hip pain   Relevant Orders   DG Hip Unilat W OR W/O Pelvis 2-3 Views Right   Diabetes mellitus type 2 in obese - Primary    hgba1c acceptable, minimize simple carbs. Increase exercise as tolerated. Continue current meds      Relevant Orders   Hemoglobin A1c (Completed)   Urine Microalbumin w/creat. ratio (Completed)   Essential hypertension    Well controlled, no changes to meds. Encouraged heart healthy diet such as the DASH diet and exercise as tolerated.       Relevant Orders   CBC with Differential/Platelet (Completed)   Comprehensive metabolic panel (Completed)   TSH (Completed)   GERD (gastroesophageal reflux disease)    Avoid offending foods, start probiotics. Do not eat large meals in late evening and consider raising head of bed.        Hyperlipidemia, mixed    Encourage heart healthy diet such as MIND or DASH diet, increase exercise, avoid trans fats, simple carbohydrates and processed foods, consider a krill or fish or flaxseed oil cap daily. Tolerating statins      Relevant Orders   Lipid panel (Completed)   Myalgia    Hydrate and monitor       Elevated sed rate    Improving, continue to monitor      Relevant Orders   Sedimentation rate (Completed)   Pharyngitis     Some mild sinus congestion and sore throat for a couple of days. Also some possible early cellulitis on upper right arm where her CGM was she is advised to cleanse the area well with witch Hazel Astringent several times a day and apply Neosporin but if worsens is given a prescription for Cefdinir to take bid.       Right knee pain    Encouraged moist heat and gentle stretching as tolerated. May try NSAIDs and prescription meds as directed and report if symptoms worsen or seek immediate care. Check xray and given Tizanidine to try prn especially at night as it keeps her up at night at times.       Relevant Orders   DG Knee Complete 4 Views Right   Other Visit Diagnoses     High risk medication use       Relevant Orders   Drug Monitoring Panel [id number] , Urine (Completed)   DM TEMPLATE (Completed)      Meds ordered this encounter  Medications   cefdinir (OMNICEF) 300 MG capsule    Sig: Take 1 capsule (300 mg total) by mouth 2 (two) times daily.    Dispense:  14 capsule    Refill:  0   tiZANidine (ZANAFLEX) 2 MG tablet    Sig: Take 1/2 - 2 tablets (1-4 mg total) by mouth 2 (two) times daily as needed for muscle spasms.    Dispense:  30 tablet    Refill:  1   I, Danise Edge, MD, personally preformed the services described in this documentation.  All medical record entries made by the scribe were at my direction and in my presence.  I have reviewed the chart and discharge instructions (if applicable) and agree that the record reflects my personal performance and is accurate and complete. 01/19/2023  I,Mohammed Iqbal,acting as a scribe for Danise Edge, MD.,have documented all relevant documentation on the behalf of Danise Edge, MD,as directed by  Danise Edge, MD while  in the presence of Penni Homans, MD.  Penni Homans, MD

## 2023-01-19 NOTE — Patient Instructions (Addendum)
Witch Hazel Astringent cleanse several times a day Neosporin a couple times a day  RSV, Respiratory Syncitial Virus Vaccine, Arexvy vaccine at pharmacy  Cellulitis, Adult  Cellulitis is a skin infection. The infected area is usually warm, red, swollen, and tender. It most commonly occurs on the lower body, such as the legs, feet, and toes, but this condition can occur on any part of the body. The infection can travel to the muscles, blood, and underlying tissue and become life-threatening without treatment. It is important to get medical treatment right away for this condition. What are the causes? Cellulitis is caused by bacteria. The bacteria enter through a break in the skin, such as a cut, burn, insect or animal bite, open sore, or crack. What increases the risk? This condition is more likely to occur in people who: Have a weak body's defense system (immune system). Are older than 60 years old. Have diabetes. Have a type of long-term (chronic) liver disease (cirrhosis) or kidney disease. Are obese. Have a skin condition such as: An itchy rash, such as eczema or psoriasis. A fungal rash on the feet or in skinfolds. Blistering rashes, such as shingles or chickenpox. Slow movement of blood in the veins (venous stasis). Fluid buildup below the skin (edema). Have open wounds on the skin, such as cuts, puncture wounds, burns, bites, scrapes, tattoos, piercings, or wounds from surgery. Have had radiation therapy. Use IV drugs. What are the signs or symptoms? Symptoms of this condition include: Skin that looks red, purple, or slightly darker than your usual skin color. Streaks or spots on the skin. Swollen area of the skin. Tenderness or pain when an area of the skin is touched. Warm skin. Fever or chills. Blisters. Tiredness (fatigue). How is this diagnosed? This condition is diagnosed based on a medical history and physical exam. You may also have tests, including: Blood  tests. Imaging tests. Tests on a sample of fluid taken from the wound (wound culture). How is this treated? Treatment for this condition may include: Medicines. These may include antibiotics or medicines to treat allergies (antihistamines). Rest. Applying cold or warm wet cloths (compresses) to the skin. If the condition is severe, you may need to stay in the hospital and get antibiotics through an IV. The infection usually starts to get better within 1-2 days of treatment. Follow these instructions at home: Medicines Take over-the-counter and prescription medicines only as told by your health care provider. If you were prescribed antibiotics, take them as told by your provider. Do not stop using the antibiotic even if you start to feel better. General instructions Drink enough fluid to keep your pee (urine) pale yellow. Do not touch or rub the infected area. Raise (elevate) the infected area above the level of your heart while you are sitting or lying down. Return to your normal activities as told by your provider. Ask your provider what activities are safe for you. Apply warm or cold compresses to the affected area as told by your provider. Keep all follow-up visits. Your provider will need to make sure that a more serious infection is not developing. Contact a health care provider if: You have a fever. Your symptoms do not improve within 1-2 days of starting treatment or you develop new symptoms. Your bone or joint underneath the infected area becomes painful after the skin has healed. Your infection returns in the same area or another area. Signs of this may include: You notice a swollen bump in the infected area. Your  red area gets larger, turns dark in color, or becomes more painful. Drainage increases. Pus or a bad smell develops in your infected area. You have more pain. You feel ill and have muscle aches and weakness. You develop vomiting or diarrhea that will not go  away. Get help right away if: You notice red streaks coming from the infected area. You notice the skin turns purple or black and falls off. This symptom may be an emergency. Get help right away. Call 911. Do not wait to see if the symptom will go away. Do not drive yourself to the hospital. This information is not intended to replace advice given to you by your health care provider. Make sure you discuss any questions you have with your health care provider. Document Revised: 05/03/2022 Document Reviewed: 05/03/2022 Elsevier Patient Education  2023 [employer].

## 2023-01-20 LAB — COMPREHENSIVE METABOLIC PANEL
ALT: 32 U/L (ref 0–35)
AST: 32 U/L (ref 0–37)
Albumin: 4.2 g/dL (ref 3.5–5.2)
Alkaline Phosphatase: 87 U/L (ref 39–117)
BUN: 14 mg/dL (ref 6–23)
CO2: 34 mEq/L — ABNORMAL HIGH (ref 19–32)
Calcium: 9.9 mg/dL (ref 8.4–10.5)
Chloride: 99 mEq/L (ref 96–112)
Creatinine, Ser: 0.85 mg/dL (ref 0.40–1.20)
GFR: 74.57 mL/min (ref >60.00)
Glucose, Bld: 93 mg/dL (ref 70–99)
Potassium: 4.2 mEq/L (ref 3.5–5.1)
Sodium: 140 mEq/L (ref 135–145)
Total Bilirubin: 0.3 mg/dL (ref 0.2–1.2)
Total Protein: 7.8 g/dL (ref 6.0–8.3)

## 2023-01-20 LAB — CBC WITH DIFFERENTIAL/PLATELET
Basophils Absolute: 0.1 10*3/uL (ref 0.0–0.1)
Basophils Relative: 0.8 % (ref 0.0–3.0)
Eosinophils Absolute: 0.3 10*3/uL (ref 0.0–0.7)
Eosinophils Relative: 2.5 % (ref 0.0–5.0)
HCT: 42.2 % (ref 36.0–46.0)
Hemoglobin: 13.9 g/dL (ref 12.0–15.0)
Lymphocytes Relative: 26.9 % (ref 12.0–46.0)
Lymphs Abs: 3.4 10*3/uL (ref 0.7–4.0)
MCHC: 32.9 g/dL (ref 30.0–36.0)
MCV: 91.7 fl (ref 78.0–100.0)
Monocytes Absolute: 0.8 10*3/uL (ref 0.1–1.0)
Monocytes Relative: 6 % (ref 3.0–12.0)
Neutro Abs: 8.1 10*3/uL — ABNORMAL HIGH (ref 1.4–7.7)
Neutrophils Relative %: 63.8 % (ref 43.0–77.0)
Platelets: 528 10*3/uL — ABNORMAL HIGH (ref 150.0–400.0)
RBC: 4.6 Mil/uL (ref 3.87–5.11)
RDW: 14.3 % (ref 11.5–15.5)
WBC: 12.7 10*3/uL — ABNORMAL HIGH (ref 4.0–10.5)

## 2023-01-20 LAB — TSH: TSH: 1.73 u[IU]/mL (ref 0.35–5.50)

## 2023-01-20 LAB — HEMOGLOBIN A1C: Hgb A1c MFr Bld: 7.1 % — ABNORMAL HIGH (ref 4.6–6.5)

## 2023-01-20 LAB — LIPID PANEL
Cholesterol: 138 mg/dL (ref 0–200)
HDL: 44.8 mg/dL (ref >39.00)
LDL Cholesterol: 71 mg/dL (ref 0–99)
NonHDL: 92.79
Total CHOL/HDL Ratio: 3
Triglycerides: 109 mg/dL (ref 0.0–149.0)
VLDL: 21.8 mg/dL (ref 0.0–40.0)

## 2023-01-20 LAB — MICROALBUMIN / CREATININE URINE RATIO
Creatinine,U: 55.1 mg/dL
Microalb Creat Ratio: 1.3 mg/g (ref 0.0–30.0)
Microalb, Ur: <0.7 mg/dL (ref 0.0–1.9)

## 2023-01-20 LAB — SEDIMENTATION RATE: Sed Rate: 16 mm/hr (ref 0–30)

## 2023-01-21 DIAGNOSIS — R700 Elevated erythrocyte sedimentation rate: Secondary | ICD-10-CM | POA: Insufficient documentation

## 2023-01-21 DIAGNOSIS — J029 Acute pharyngitis, unspecified: Secondary | ICD-10-CM | POA: Insufficient documentation

## 2023-01-21 LAB — DRUG MONITORING PANEL 376104, URINE
Amphetamines: NEGATIVE ng/mL (ref <500)
Barbiturates: NEGATIVE ng/mL (ref <300)
Benzodiazepines: NEGATIVE ng/mL (ref <100)
Cocaine Metabolite: NEGATIVE ng/mL (ref <150)
Desmethyltramadol: NEGATIVE ng/mL (ref <100)
Opiates: NEGATIVE ng/mL (ref <100)
Oxycodone: NEGATIVE ng/mL (ref <100)
Tramadol: NEGATIVE ng/mL (ref <100)

## 2023-01-21 LAB — DM TEMPLATE

## 2023-01-21 NOTE — Assessment & Plan Note (Signed)
Improving, continue to monitor

## 2023-01-21 NOTE — Assessment & Plan Note (Signed)
Some mild sinus congestion and sore throat for a couple of days. Also some possible early cellulitis on upper right arm where her CGM was she is advised to cleanse the area well with witch Hazel Astringent several times a day and apply Neosporin but if worsens is given a prescription for Cefdinir to take bid.

## 2023-01-21 NOTE — Assessment & Plan Note (Signed)
Encouraged moist heat and gentle stretching as tolerated. May try NSAIDs and prescription meds as directed and report if symptoms worsen or seek immediate care. Check xray and given Tizanidine to try prn especially at night as it keeps her up at night at times.

## 2023-02-10 ENCOUNTER — Telehealth: Payer: Self-pay | Admitting: Family Medicine

## 2023-02-10 NOTE — Telephone Encounter (Signed)
Patient would like to know if her FMLA forms have been received. Please advise.

## 2023-02-10 NOTE — Telephone Encounter (Signed)
Called pt lvm we did received Fmla forms  And will be working on them. Dr.Blyth is out of the office today.

## 2023-02-15 ENCOUNTER — Telehealth: Payer: Self-pay

## 2023-02-15 NOTE — Telephone Encounter (Signed)
Called pt lvm needing to ask few questions  Regarding FMLA forms and dates.

## 2023-02-16 NOTE — Telephone Encounter (Signed)
Pt called back to go over info.

## 2023-02-16 NOTE — Telephone Encounter (Signed)
Called pt back regarding FMLA forms and its completed. Faxed forms back

## 2023-02-21 ENCOUNTER — Other Ambulatory Visit: Payer: Self-pay | Admitting: Family Medicine

## 2023-02-21 ENCOUNTER — Encounter: Payer: Self-pay | Admitting: Family Medicine

## 2023-02-21 ENCOUNTER — Other Ambulatory Visit: Payer: Self-pay | Admitting: Cardiology

## 2023-02-21 ENCOUNTER — Other Ambulatory Visit (HOSPITAL_COMMUNITY): Payer: Self-pay

## 2023-02-21 MED ORDER — TRAMADOL HCL 50 MG PO TABS
50.0000 mg | ORAL_TABLET | Freq: Three times a day (TID) | ORAL | 0 refills | Status: DC | PRN
  Filled 2023-02-21: qty 90, 30d supply, fill #0

## 2023-02-21 MED ORDER — ROSUVASTATIN CALCIUM 20 MG PO TABS
20.0000 mg | ORAL_TABLET | Freq: Every day | ORAL | 0 refills | Status: DC
  Filled 2023-02-21: qty 15, 15d supply, fill #0

## 2023-02-21 MED ORDER — METFORMIN HCL ER 500 MG PO TB24
2000.0000 mg | ORAL_TABLET | Freq: Every day | ORAL | 0 refills | Status: DC
  Filled 2023-02-21: qty 360, 90d supply, fill #0

## 2023-02-21 MED ORDER — ALBUTEROL SULFATE HFA 108 (90 BASE) MCG/ACT IN AERS
2.0000 | INHALATION_SPRAY | Freq: Four times a day (QID) | RESPIRATORY_TRACT | 5 refills | Status: DC | PRN
  Filled 2023-02-21: qty 6.7, 25d supply, fill #0
  Filled 2023-07-05: qty 6.7, 25d supply, fill #1

## 2023-02-21 NOTE — Telephone Encounter (Signed)
Requesting: tramadol 50mg   Contract:  01/19/23 UDS: 01/19/23 Last Visit: 10/17/22 Next Visit: 05/23/23 Last Refill: 03/08/22 #90 and 0RF   Please Advise

## 2023-02-22 ENCOUNTER — Other Ambulatory Visit: Payer: Self-pay

## 2023-02-22 ENCOUNTER — Other Ambulatory Visit (HOSPITAL_COMMUNITY): Payer: Self-pay

## 2023-02-22 MED ORDER — FAMOTIDINE 20 MG PO TABS
20.0000 mg | ORAL_TABLET | Freq: Two times a day (BID) | ORAL | 1 refills | Status: DC
  Filled 2023-02-22 – 2023-04-04 (×2): qty 180, 90d supply, fill #0
  Filled 2023-08-09: qty 180, 90d supply, fill #1

## 2023-02-22 MED ORDER — CARVEDILOL 3.125 MG PO TABS
3.1250 mg | ORAL_TABLET | Freq: Two times a day (BID) | ORAL | 1 refills | Status: DC
  Filled 2023-02-22 – 2023-04-04 (×2): qty 180, 90d supply, fill #0
  Filled 2023-08-09: qty 180, 90d supply, fill #1

## 2023-02-22 MED ORDER — HYDROCHLOROTHIAZIDE 25 MG PO TABS
25.0000 mg | ORAL_TABLET | Freq: Every day | ORAL | 1 refills | Status: DC
  Filled 2023-02-22 – 2023-04-04 (×2): qty 90, 90d supply, fill #0

## 2023-02-22 MED ORDER — PANTOPRAZOLE SODIUM 40 MG PO TBEC
40.0000 mg | DELAYED_RELEASE_TABLET | Freq: Every day | ORAL | 1 refills | Status: DC
  Filled 2023-02-22 – 2023-04-04 (×2): qty 90, 90d supply, fill #0
  Filled 2023-08-09: qty 90, 90d supply, fill #1

## 2023-02-23 ENCOUNTER — Other Ambulatory Visit: Payer: Self-pay

## 2023-02-23 DIAGNOSIS — M79671 Pain in right foot: Secondary | ICD-10-CM

## 2023-03-02 ENCOUNTER — Ambulatory Visit: Payer: Commercial Managed Care - PPO | Admitting: Podiatrist

## 2023-03-02 ENCOUNTER — Ambulatory Visit (INDEPENDENT_AMBULATORY_CARE_PROVIDER_SITE_OTHER): Payer: Commercial Managed Care - PPO

## 2023-03-02 ENCOUNTER — Encounter: Payer: Self-pay | Admitting: Podiatrist

## 2023-03-02 DIAGNOSIS — M722 Plantar fascial fibromatosis: Secondary | ICD-10-CM

## 2023-03-02 MED ORDER — TRIAMCINOLONE ACETONIDE 10 MG/ML IJ SUSP
10.0000 mg | Freq: Once | INTRAMUSCULAR | Status: AC
Start: 2023-03-02 — End: 2023-03-02
  Administered 2023-03-02: 10 mg

## 2023-03-02 NOTE — Patient Instructions (Signed)

## 2023-03-02 NOTE — Progress Notes (Signed)
  Subjective: Eileen Richards is a 62 y.o. female patient presents to office with complaint of moderate heel pain on the right  heel. Patient admits to post static dyskinesia for 3-4 weeks in duration. Patient has treated this problem with stretching and icing with no relief.  She works at [employer] and relates she is on her feet during her work shift.  Denies any other pedal complaints.    Allergies  Allergen Reactions   Farxiga [Dapagliflozin] Hives    Objective: Physical Exam General: The patient is alert and oriented x3 in no acute distress.  Dermatology: Skin is warm, dry and supple bilateral lower extremities. Nails 1-10 are normal. There is no erythema, edema, no eccymosis, no open lesions present. Integument is otherwise unremarkable.  Vascular: Dorsalis Pedis pulse and Posterior Tibial pulse are 2/4 bilateral. Capillary fill time is immediate to all digits.  Neurological: Grossly intact to light touch with an achilles reflex of +2/5 and a  negative Tinel's sign bilateral.  Musculoskeletal: Tenderness to palpation at the medial calcaneal tubercale and through the insertion of the plantar fascia on the Right foot. No pain with compression of calcaneus bilateral. No pain with tuning fork to calcaneus bilateral. No pain with calf compression bilateral. There is decreased Ankle joint range of motion bilateral. All other joints range of motion within normal limits bilateral. Strength 5/5 in all groups bilateral.   Xray, rightfoot:  Normal osseous mineralization. Joint spaces preserved. No fracture/dislocation/boney destruction.  Tiny calcaneal spur present with mild thickening of plantar fascia. No other soft tissue abnormalities or radiopaque foreign bodies.   Assessment and Plan: Problem List Items Addressed This Visit   None Visit Diagnoses     Plantar fasciitis of right foot    -  Primary   Relevant Orders   DG Foot Complete Right       -Complete examination  performed.  -Xrays reviewed - Discussed etiology and pathology of plantar fasciitis along with conservative approach to treatment.   -After oral consent and aseptic prep, injected a mixture containing 20mg  Kenalog and 5mg  marcaine plain was infiltrated into the area of maximal tenderness of the Right heel. Post-injection care discussed with patient.  -Recommended good supportive shoes  -Explained and dispensed to patient daily stretching exercises.. -Patient to return to office in 4 weeks for recheck

## 2023-03-07 ENCOUNTER — Other Ambulatory Visit (HOSPITAL_COMMUNITY): Payer: Self-pay

## 2023-03-16 ENCOUNTER — Other Ambulatory Visit: Payer: Self-pay

## 2023-03-28 DIAGNOSIS — H2513 Age-related nuclear cataract, bilateral: Secondary | ICD-10-CM | POA: Diagnosis not present

## 2023-03-28 DIAGNOSIS — H35033 Hypertensive retinopathy, bilateral: Secondary | ICD-10-CM | POA: Diagnosis not present

## 2023-03-28 DIAGNOSIS — H02535 Eyelid retraction left lower eyelid: Secondary | ICD-10-CM | POA: Diagnosis not present

## 2023-03-28 DIAGNOSIS — E119 Type 2 diabetes mellitus without complications: Secondary | ICD-10-CM | POA: Diagnosis not present

## 2023-03-28 DIAGNOSIS — H524 Presbyopia: Secondary | ICD-10-CM | POA: Diagnosis not present

## 2023-03-28 DIAGNOSIS — Z9889 Other specified postprocedural states: Secondary | ICD-10-CM | POA: Diagnosis not present

## 2023-03-28 DIAGNOSIS — H02532 Eyelid retraction right lower eyelid: Secondary | ICD-10-CM | POA: Diagnosis not present

## 2023-03-28 LAB — HM DIABETES EYE EXAM

## 2023-03-29 ENCOUNTER — Other Ambulatory Visit: Payer: Self-pay

## 2023-03-29 ENCOUNTER — Other Ambulatory Visit (HOSPITAL_COMMUNITY): Payer: Self-pay

## 2023-03-29 MED ORDER — CYCLOSPORINE 0.05 % OP EMUL
1.0000 [drp] | Freq: Two times a day (BID) | OPHTHALMIC | 3 refills | Status: AC
  Filled 2023-03-29: qty 180, 90d supply, fill #0
  Filled 2023-08-09: qty 180, 90d supply, fill #1

## 2023-03-29 MED ORDER — FLUOROMETHOLONE 0.1 % OP SUSP
OPHTHALMIC | 0 refills | Status: AC
  Filled 2023-03-29: qty 10, 28d supply, fill #0

## 2023-04-04 ENCOUNTER — Other Ambulatory Visit: Payer: Self-pay

## 2023-04-04 ENCOUNTER — Other Ambulatory Visit (HOSPITAL_COMMUNITY): Payer: Self-pay

## 2023-04-04 ENCOUNTER — Other Ambulatory Visit: Payer: Self-pay | Admitting: Family Medicine

## 2023-04-04 MED ORDER — POTASSIUM CHLORIDE CRYS ER 20 MEQ PO TBCR
20.0000 meq | EXTENDED_RELEASE_TABLET | Freq: Every day | ORAL | 1 refills | Status: DC
  Filled 2023-04-04: qty 90, 90d supply, fill #0
  Filled 2023-10-19: qty 90, 90d supply, fill #1

## 2023-04-06 ENCOUNTER — Other Ambulatory Visit (HOSPITAL_COMMUNITY): Payer: Self-pay

## 2023-04-11 ENCOUNTER — Encounter: Payer: Self-pay | Admitting: Podiatry

## 2023-04-11 ENCOUNTER — Ambulatory Visit: Payer: Commercial Managed Care - PPO | Admitting: Podiatry

## 2023-04-11 DIAGNOSIS — M722 Plantar fascial fibromatosis: Secondary | ICD-10-CM

## 2023-04-11 NOTE — Patient Instructions (Signed)

## 2023-04-11 NOTE — Progress Notes (Signed)
Subjective: Chief Complaint  Patient presents with   Foot Pain    Still having pain in right heel months after injection, stopped for about a month afterwards   62 year old female presents above concerns.  She was last seen by Dr. Irving Shows and had a steroid injection performed which only lasted for about a week.  States that it feels better when she is off of her feet. It aches and throbs.   Objective: AAO x3, NAD DP/PT pulses palpable bilaterally, CRT less than 3 seconds There are some tenderness palpation along plantar medial tubercle of the calcaneus at the insertion of the plantar fascia on the right side.  There is no pain with lateral compression of the calcaneus.  No edema, erythema.  No pain with lateral compression of the calcaneus.  Lacks approximately tenderness.  Intact.  No pain Achilles tendon. No pain with calf compression, swelling, warmth, erythema  Assessment: Plan fasciitis, right heel pain  Plan: -All treatment options discussed with the patient including all alternatives, risks, complications.  -A second steroid injection was performed.  See procedure note below. -Cane for assistance for mobilization episode that healing.  As her symptoms improve she can gradually transition back to a good, supportive shoe with an insert.  Discussed resting, icing daily. -If no improvement consider MRI. -Patient encouraged to call the office with any questions, concerns, change in symptoms.   Vivi Barrack DPM

## 2023-04-15 ENCOUNTER — Other Ambulatory Visit: Payer: Self-pay | Admitting: Family Medicine

## 2023-04-17 ENCOUNTER — Other Ambulatory Visit (HOSPITAL_COMMUNITY): Payer: Self-pay

## 2023-04-17 MED ORDER — OZEMPIC (2 MG/DOSE) 8 MG/3ML ~~LOC~~ SOPN
2.0000 mg | PEN_INJECTOR | SUBCUTANEOUS | 1 refills | Status: DC
  Filled 2023-04-17: qty 9, 84d supply, fill #0

## 2023-04-17 MED ORDER — FREESTYLE LIBRE 2 SENSOR MISC
3 refills | Status: DC
  Filled 2023-04-17: qty 2, 28d supply, fill #0
  Filled 2023-05-15: qty 2, 28d supply, fill #1
  Filled 2023-06-14: qty 2, 28d supply, fill #2
  Filled 2023-07-18 (×2): qty 2, 28d supply, fill #3

## 2023-04-18 ENCOUNTER — Other Ambulatory Visit (HOSPITAL_COMMUNITY): Payer: Self-pay

## 2023-04-28 ENCOUNTER — Other Ambulatory Visit: Payer: Self-pay | Admitting: Family Medicine

## 2023-04-28 ENCOUNTER — Other Ambulatory Visit: Payer: Self-pay | Admitting: Family

## 2023-04-28 ENCOUNTER — Other Ambulatory Visit (HOSPITAL_COMMUNITY): Payer: Self-pay

## 2023-04-28 DIAGNOSIS — D509 Iron deficiency anemia, unspecified: Secondary | ICD-10-CM

## 2023-04-28 MED ORDER — FUSION PLUS PO CAPS
1.0000 | ORAL_CAPSULE | Freq: Every day | ORAL | 11 refills | Status: DC
Start: 2023-04-28 — End: 2024-05-21
  Filled 2023-04-28: qty 30, 30d supply, fill #0
  Filled 2023-05-29 – 2023-06-02 (×2): qty 30, 30d supply, fill #1
  Filled 2023-07-17: qty 30, 30d supply, fill #2
  Filled 2023-08-09: qty 30, 30d supply, fill #3
  Filled 2023-09-19: qty 30, 30d supply, fill #4
  Filled 2023-10-16: qty 30, 30d supply, fill #5
  Filled 2023-12-02: qty 30, 30d supply, fill #6
  Filled 2024-01-03: qty 30, 30d supply, fill #7
  Filled 2024-02-14: qty 30, 30d supply, fill #8
  Filled 2024-04-08: qty 30, 30d supply, fill #9

## 2023-05-02 ENCOUNTER — Other Ambulatory Visit (HOSPITAL_COMMUNITY): Payer: Self-pay

## 2023-05-02 MED ORDER — TECHLITE PEN NEEDLES 32G X 4 MM MISC
3 refills | Status: AC
  Filled 2023-05-02: qty 100, 90d supply, fill #0
  Filled 2023-08-09: qty 100, 90d supply, fill #1
  Filled 2024-01-26: qty 100, 90d supply, fill #2

## 2023-05-04 ENCOUNTER — Encounter (INDEPENDENT_AMBULATORY_CARE_PROVIDER_SITE_OTHER): Payer: Self-pay

## 2023-05-11 ENCOUNTER — Ambulatory Visit: Payer: Commercial Managed Care - PPO | Admitting: Podiatry

## 2023-05-11 DIAGNOSIS — M722 Plantar fascial fibromatosis: Secondary | ICD-10-CM

## 2023-05-11 NOTE — Patient Instructions (Signed)

## 2023-05-11 NOTE — Progress Notes (Signed)
Subjective: Chief Complaint  Patient presents with   Follow-up    Plantar fasciitis to right foot. Continues to have heel pain. Pain comes and goes. She is currently not taking any medications for the pain.    62 year old female presents the office with above concerns.  She states that she is doing a lot better.  The injection did help.  She has an intermittent burning localized to the heel but does not radiate.  No recent injury or changes otherwise.  Objective: AAO x3, NAD DP/PT pulses palpable bilaterally, CRT less than 3 seconds There is still tenderness palpation along the plantar medial tubercle of the calcaneus at the insertion of the plantar fascia on the right foot.  There is no pain with lateral compression of the calcaneus.  There is no edema, erythema.  There is a negative Tinel's sign today.  MMT respiratory. No pain with calf compression, swelling, warmth, erythema  Assessment: Plantar fasciitis  Plan: -All treatment options discussed with the patient including all alternatives, risks, complications.  -She has been making some progress.  She has some nerve symptoms that feels more localized from inflammation.  She has negative Tinel sign.  If this persist would recommend getting nerve conduction test.  We did do 1 more steroid injection today.  See procedure note below.  Continue stretching, icing a regular basis all shoes, good arch support.  Since she seems to be getting better may hold off on advanced imaging but we will reevaluate if needed. -Patient encouraged to call the office with any questions, concerns, change in symptoms.   Procedure: Injection Tendon/Ligament Discussed alternatives, risks, complications and verbal consent was obtained.  Location: Right plantar fascia at the glabrous junction; medial approach. Skin Prep: Alcohol. Injectate: 0.5cc 0.5% marcaine plain, 0.5 cc 2% lidocaine plain and, 1 cc kenalog 10. Disposition: Patient tolerated procedure well.  Injection site dressed with a band-aid.  Post-injection care was discussed and return precautions discussed.   Return if symptoms worsen or fail to improve.  Vivi Barrack DPM

## 2023-05-15 ENCOUNTER — Other Ambulatory Visit (HOSPITAL_COMMUNITY): Payer: Self-pay

## 2023-05-15 DIAGNOSIS — Z1231 Encounter for screening mammogram for malignant neoplasm of breast: Secondary | ICD-10-CM | POA: Diagnosis not present

## 2023-05-15 LAB — HM MAMMOGRAPHY

## 2023-05-15 MED ORDER — OMRON 3 SERIES BP MONITOR DEVI
0 refills | Status: AC
  Filled 2023-05-15: qty 1, 1d supply, fill #0

## 2023-05-22 NOTE — Assessment & Plan Note (Signed)
hgba1c acceptable, minimize simple carbs. Increase exercise as tolerated. Continue current meds 

## 2023-05-22 NOTE — Assessment & Plan Note (Signed)
Patient encouraged to maintain heart healthy diet, regular exercise, adequate sleep. Consider daily probiotics. Take medications as prescribed. Labs ordered and reviewed. Colonoscopy was 2019 repeat in 2024. Follows with OB/GYN for paps and MGM

## 2023-05-22 NOTE — Assessment & Plan Note (Signed)
monitor

## 2023-05-22 NOTE — Assessment & Plan Note (Signed)
Well controlled, no changes to meds. Encouraged heart healthy diet such as the DASH diet and exercise as tolerated.  °

## 2023-05-22 NOTE — Assessment & Plan Note (Signed)
Hydrate and monitor 

## 2023-05-22 NOTE — Assessment & Plan Note (Signed)
Encourage heart healthy diet such as MIND or DASH diet, increase exercise, avoid trans fats, simple carbohydrates and processed foods, consider a krill or fish or flaxseed oil cap daily. Tolerating statins 

## 2023-05-23 ENCOUNTER — Ambulatory Visit (INDEPENDENT_AMBULATORY_CARE_PROVIDER_SITE_OTHER): Payer: Commercial Managed Care - PPO | Admitting: Family Medicine

## 2023-05-23 ENCOUNTER — Encounter: Payer: Self-pay | Admitting: Family Medicine

## 2023-05-23 ENCOUNTER — Other Ambulatory Visit (HOSPITAL_COMMUNITY): Payer: Self-pay

## 2023-05-23 VITALS — BP 128/78 | HR 84 | Temp 98.0°F | Resp 16 | Ht 64.0 in | Wt 234.0 lb

## 2023-05-23 DIAGNOSIS — Z9889 Other specified postprocedural states: Secondary | ICD-10-CM

## 2023-05-23 DIAGNOSIS — E669 Obesity, unspecified: Secondary | ICD-10-CM

## 2023-05-23 DIAGNOSIS — E782 Mixed hyperlipidemia: Secondary | ICD-10-CM

## 2023-05-23 DIAGNOSIS — M791 Myalgia, unspecified site: Secondary | ICD-10-CM

## 2023-05-23 DIAGNOSIS — Z23 Encounter for immunization: Secondary | ICD-10-CM | POA: Diagnosis not present

## 2023-05-23 DIAGNOSIS — I10 Essential (primary) hypertension: Secondary | ICD-10-CM

## 2023-05-23 DIAGNOSIS — Z780 Asymptomatic menopausal state: Secondary | ICD-10-CM

## 2023-05-23 DIAGNOSIS — E1169 Type 2 diabetes mellitus with other specified complication: Secondary | ICD-10-CM

## 2023-05-23 DIAGNOSIS — Z6841 Body Mass Index (BMI) 40.0 and over, adult: Secondary | ICD-10-CM

## 2023-05-23 DIAGNOSIS — R700 Elevated erythrocyte sedimentation rate: Secondary | ICD-10-CM

## 2023-05-23 DIAGNOSIS — E2839 Other primary ovarian failure: Secondary | ICD-10-CM

## 2023-05-23 DIAGNOSIS — Z0000 Encounter for general adult medical examination without abnormal findings: Secondary | ICD-10-CM

## 2023-05-23 MED ORDER — TIRZEPATIDE 10 MG/0.5ML ~~LOC~~ SOAJ
10.0000 mg | SUBCUTANEOUS | 1 refills | Status: DC
  Filled 2023-05-23: qty 2, 28d supply, fill #0
  Filled 2023-08-17: qty 2, 28d supply, fill #1
  Filled 2023-09-19: qty 2, 28d supply, fill #2

## 2023-05-23 NOTE — Patient Instructions (Addendum)
Mounjaro 10 mg weekly x 4-8 weeks let us know when you want to increase to 12.5 mg and then to 15 mg   Netflix [employer]  4000, 8000 steps       Preventive Care 77-60 Years Old, Female Preventive care refers to lifestyle choices and visits with your health care provider that can promote health and wellness. Preventive care visits are also called wellness exams. What can I expect for my preventive care visit? Counseling Your health care provider may ask you questions about your: Medical history, including: Past medical problems. Family medical history. Pregnancy history. Current health, including: Menstrual cycle. Method of birth control. Emotional well-being. Home life and relationship well-being. Sexual activity and sexual health. Lifestyle, including: Alcohol, nicotine or tobacco, and drug use. Access to firearms. Diet, exercise, and sleep habits. Work and work Astronomer. Sunscreen use. Safety issues such as seatbelt and bike helmet use. Physical exam Your health care provider will check your: Height and weight. These may be used to calculate your BMI (body mass index). BMI is a measurement that tells if you are at a healthy weight. Waist circumference. This measures the distance around your waistline. This measurement also tells if you are at a healthy weight and may help predict your risk of certain diseases, such as type 2 diabetes and high blood pressure. Heart rate and blood pressure. Body temperature. Skin for abnormal spots. What immunizations do I need?  Vaccines are usually given at various ages, according to a schedule. Your health care provider will recommend vaccines for you based on your age, medical history, and lifestyle or other factors, such as travel or where you work. What tests do I need? Screening Your health care provider may recommend screening tests for certain conditions. This may include: Lipid and cholesterol levels. Diabetes  screening. This is done by checking your blood sugar (glucose) after you have not eaten for a while (fasting). Pelvic exam and Pap test. Hepatitis B test. Hepatitis C test. HIV (human immunodeficiency virus) test. STI (sexually transmitted infection) testing, if you are at risk. Lung cancer screening. Colorectal cancer screening. Mammogram. Talk with your health care provider about when you should start having regular mammograms. This may depend on whether you have a family history of breast cancer. BRCA-related cancer screening. This may be done if you have a family history of breast, ovarian, tubal, or peritoneal cancers. Bone density scan. This is done to screen for osteoporosis. Talk with your health care provider about your test results, treatment options, and if necessary, the need for more tests. Follow these instructions at home: Eating and drinking  Eat a diet that includes fresh fruits and vegetables, whole grains, lean protein, and low-fat dairy products. Take vitamin and mineral supplements as recommended by your health care provider. Do not drink alcohol if: Your health care provider tells you not to drink. You are pregnant, may be pregnant, or are planning to become pregnant. If you drink alcohol: Limit how much you have to 0-1 drink a day. Know how much alcohol is in your drink. In the [place] one drink equals one 12 oz bottle of beer (355 mL), one 5 oz glass of wine (148 mL), or one 1 oz glass of hard liquor (44 mL). Lifestyle Brush your teeth every morning and night with fluoride toothpaste. Floss one time each day. Exercise for at least 30 minutes 5 or more days each week. Do not use any products that contain nicotine or tobacco. These products include cigarettes,  chewing tobacco, and vaping devices, such as e-cigarettes. If you need help quitting, ask your health care provider. Do not use drugs. If you are sexually active, practice safe sex. Use a condom or other form of  protection to prevent STIs. If you do not wish to become pregnant, use a form of birth control. If you plan to become pregnant, see your health care provider for a prepregnancy visit. Take aspirin only as told by your health care provider. Make sure that you understand how much to take and what form to take. Work with your health care provider to find out whether it is safe and beneficial for you to take aspirin daily. Find healthy ways to manage stress, such as: Meditation, yoga, or listening to music. Journaling. Talking to a trusted person. Spending time with friends and family. Minimize exposure to UV radiation to reduce your risk of skin cancer. Safety Always wear your seat belt while driving or riding in a vehicle. Do not drive: If you have been drinking alcohol. Do not ride with someone who has been drinking. When you are tired or distracted. While texting. If you have been using any mind-altering substances or drugs. Wear a helmet and other protective equipment during sports activities. If you have firearms in your house, make sure you follow all gun safety procedures. Seek help if you have been physically or sexually abused. What's next? Visit your health care provider once a year for an annual wellness visit. Ask your health care provider how often you should have your eyes and teeth checked. Stay up to date on all vaccines. This information is not intended to replace advice given to you by your health care provider. Make sure you discuss any questions you have with your health care provider. Document Revised: 03/03/2021 Document Reviewed: 03/03/2021 Elsevier Patient Education  2024 [employer].

## 2023-05-24 ENCOUNTER — Other Ambulatory Visit (HOSPITAL_COMMUNITY): Payer: Self-pay

## 2023-05-24 ENCOUNTER — Encounter: Payer: Self-pay | Admitting: Family Medicine

## 2023-05-24 LAB — COMPREHENSIVE METABOLIC PANEL
ALT: 33 U/L (ref 0–35)
AST: 25 U/L (ref 0–37)
Albumin: 4 g/dL (ref 3.5–5.2)
Alkaline Phosphatase: 87 U/L (ref 39–117)
BUN: 12 mg/dL (ref 6–23)
CO2: 32 meq/L (ref 19–32)
Calcium: 9.6 mg/dL (ref 8.4–10.5)
Chloride: 102 meq/L (ref 96–112)
Creatinine, Ser: 0.74 mg/dL (ref 0.40–1.20)
GFR: 87.85 mL/min (ref >60.00)
Glucose, Bld: 84 mg/dL (ref 70–99)
Potassium: 4.4 meq/L (ref 3.5–5.1)
Sodium: 141 meq/L (ref 135–145)
Total Bilirubin: 0.4 mg/dL (ref 0.2–1.2)
Total Protein: 7.1 g/dL (ref 6.0–8.3)

## 2023-05-24 LAB — LIPID PANEL
Cholesterol: 158 mg/dL (ref 0–200)
HDL: 41.8 mg/dL (ref >39.00)
LDL Cholesterol: 94 mg/dL (ref 0–99)
NonHDL: 116.29
Total CHOL/HDL Ratio: 4
Triglycerides: 110 mg/dL (ref 0.0–149.0)
VLDL: 22 mg/dL (ref 0.0–40.0)

## 2023-05-24 LAB — CBC WITH DIFFERENTIAL/PLATELET
Basophils Absolute: 0.1 10*3/uL (ref 0.0–0.1)
Basophils Relative: 1.1 % (ref 0.0–3.0)
Eosinophils Absolute: 0.2 10*3/uL (ref 0.0–0.7)
Eosinophils Relative: 1.8 % (ref 0.0–5.0)
HCT: 42.3 % (ref 36.0–46.0)
Hemoglobin: 13.4 g/dL (ref 12.0–15.0)
Lymphocytes Relative: 22.1 % (ref 12.0–46.0)
Lymphs Abs: 2.8 10*3/uL (ref 0.7–4.0)
MCHC: 31.8 g/dL (ref 30.0–36.0)
MCV: 92.6 fl (ref 78.0–100.0)
Monocytes Absolute: 0.7 10*3/uL (ref 0.1–1.0)
Monocytes Relative: 5.3 % (ref 3.0–12.0)
Neutro Abs: 8.7 10*3/uL — ABNORMAL HIGH (ref 1.4–7.7)
Neutrophils Relative %: 69.7 % (ref 43.0–77.0)
Platelets: 528 10*3/uL — ABNORMAL HIGH (ref 150.0–400.0)
RBC: 4.57 Mil/uL (ref 3.87–5.11)
RDW: 14.6 % (ref 11.5–15.5)
WBC: 12.5 10*3/uL — ABNORMAL HIGH (ref 4.0–10.5)

## 2023-05-24 LAB — TSH: TSH: 1.01 u[IU]/mL (ref 0.35–5.50)

## 2023-05-24 LAB — HEMOGLOBIN A1C: Hgb A1c MFr Bld: 7.3 % — ABNORMAL HIGH (ref 4.6–6.5)

## 2023-05-24 NOTE — Progress Notes (Signed)
Subjective:    Patient ID: Eileen Richards, female    DOB: 06/02/1961, 62 y.o.   MRN: [id number]  Chief Complaint  Patient presents with   Annual Exam    Annual Exam    HPI Discussed the use of AI scribe software for clinical note transcription with the patient, who gave verbal consent to proceed.  History of Present Illness   The patient, with a past medical history of diabetes, plantar fasciitis, and polyps, presents with a skin tag on the right side of the neck. The patient's family member noticed the skin tag and advised the patient to consult with the doctor.  The patient also reports a recent flare-up of plantar fasciitis, which was so severe that they were unable to walk. The patient received a cortisone shot from Dr. Ardelle Anton, which provided temporary relief. No polyuria or polydipsia The patient has been keeping up with their mammograms, with the most recent one showing no evidence of concern. The patient has experienced recent bereavements in the family, with the passing of a maternal aunt and a paternal uncle. The patient's maternal aunt had dementia and was in a care facility for several years before her passing. Is trying to stay active and eat a heart healthy diet.        Past Medical History:  Diagnosis Date   Achilles tendonitis 10/14/2012   Asthma    childhood   Atypical chest pain    normal stress echo 02/2009 -LV size was normal LV global systolic function was normal- Normal wall motion; no LV regional wall motion abnormalities   Benign paroxysmal positional vertigo 04/26/2015   Chest pain, atypical 08/24/2013   Diabetes mellitus    GERD (gastroesophageal reflux disease)    Hand pain, left 11/10/2015   Hyperlipemia    Hypertension    Neuropathic pain of ankle, left 04/27/2017   Onychomycosis 04/17/2014   Pain of right heel 10/14/2012   Preventative health care 11/22/2015   Sinusitis, acute 01/17/2016   Thoracic outlet syndrome 11/03/2014   Urinary urgency 12/15/2015     Past Surgical History:  Procedure Laterality Date   ABDOMINAL HYSTERECTOMY     CYSTOSCOPY  07/23/2012   Procedure: CYSTOSCOPY;  Surgeon: Serita Kyle, MD;  Location: [facility];  Service: Gynecology;  Laterality: N/A;   DILATION AND CURETTAGE OF UTERUS     Lysis of Adhesions   ROBOTIC ASSISTED LAPAROSCOPIC LYSIS OF ADHESION  07/23/2012   Procedure: ROBOTIC ASSISTED LAPAROSCOPIC LYSIS OF ADHESION;  Surgeon: Serita Kyle, MD;  Location: [facility];  Service: Gynecology;  Laterality: N/A;   ROBOTIC ASSISTED SALPINGO OOPHERECTOMY  07/23/2012   Procedure: ROBOTIC ASSISTED SALPINGO OOPHERECTOMY;  Surgeon: Serita Kyle, MD;  Location: [facility];  Service: Gynecology;  Laterality: Bilateral;    Family History  Problem Relation Age of Onset   GER disease Mother    GER disease Father    Heart disease Father        father has "heart problems"   Anemia Father    Dementia Maternal Aunt    Miscarriages / Stillbirths Maternal Uncle    Depression Paternal Grandmother    Asthma Other    Hypertension Other    Diabetes Other     Social History   Socioeconomic History   Marital status: Single    Spouse name: Not on file   Number of children: Not on file   Years of education: Not on file   Highest education level: Not on file  Occupational History   Not on file  Tobacco Use   Smoking status: Former    Current packs/day: 0.00    Types: Cigarettes    Quit date: 02/17/2014    Years since quitting: 9.2   Smokeless tobacco: Never  Vaping Use   Vaping status: Never Used  Substance and Sexual Activity   Alcohol use: No   Drug use: No   Sexual activity: Not on file  Other Topics Concern   Not on file  Social History Narrative   Not on file   Social Determinants of Health   Financial Resource Strain: Not on file  Food Insecurity: Not on file  Transportation Needs: Not on file  Physical Activity: Not on file  Stress: Not on file  Social Connections: Not on file   Intimate Partner Violence: Not on file    Outpatient Medications Prior to Visit  Medication Sig Dispense Refill   albuterol (VENTOLIN HFA) 108 (90 Base) MCG/ACT inhaler Inhale 2 puffs into the lungs every 6 (six) hours as needed for wheezing or shortness of breath. 6.7 g 5   aspirin 81 MG tablet Take 81 mg by mouth daily.     Blood Pressure Monitoring (OMRON 3 SERIES BP MONITOR) DEVI Use as directed 1 each 0   carvedilol (COREG) 3.125 MG tablet Take 1 tablet (3.125 mg total) by mouth 2 (two) times daily. 180 tablet 1   cetirizine (ZYRTEC) 10 MG tablet TAKE 1 TABLET BY MOUTH 2 TIMES DAILY. (Patient taking differently: Take 10 mg by mouth 2 (two) times daily.) 200 tablet 1   Continuous Blood Gluc Receiver (FREESTYLE LIBRE 2 READER) DEVI USE AS DIRECTED. (Patient taking differently: 1 each by Other route See admin instructions. Glucose reading) 1 each 1   Continuous Glucose Sensor (FREESTYLE LIBRE 2 SENSOR) MISC Use as directed 2 each 3   cycloSPORINE (RESTASIS) 0.05 % ophthalmic emulsion Instill 1 drop into both eyes twice a day 180 each 3   diclofenac Sodium (VOLTAREN) 1 % GEL Apply 2 g topically 4 (four) times daily. Rub into affected area of foot 2 to 4 times daily (Patient taking differently: Apply 2 g topically as needed (Joint pain). Rub into affected area of foot 2 to 4 times daily) 100 g 2   famotidine (PEPCID) 20 MG tablet Take 1 tablet (20 mg total) by mouth 2 (two) times daily. 180 tablet 1   Fluticasone Furoate (ARNUITY ELLIPTA) 100 MCG/ACT AEPB Inhale 1 puff into the lungs daily. 30 each 3   gabapentin (NEURONTIN) 300 MG capsule Take 1-2 capsules (300-600 mg total) by mouth 3 (three) times daily. 180 capsule 3   insulin glargine-yfgn (SEMGLEE, YFGN,) 100 UNIT/ML Pen Inject 48 Units into the skin every morning. 60 mL 1   Insulin Pen Needle (TECHLITE PEN NEEDLES) 32G X 4 MM MISC Use once daily 100 each 3   Iron-FA-B Cmp-C-Biot-Probiotic (FUSION PLUS) CAPS Take 1 capsule by mouth daily.  30 capsule 11   meloxicam (MOBIC) 15 MG tablet Take 1 tablet (15 mg total) by mouth daily. 30 tablet 0   metFORMIN (GLUCOPHAGE-XR) 500 MG 24 hr tablet Take 4 tablets (2,000 mg total) by mouth daily. 360 tablet 0   nitroGLYCERIN (NITROSTAT) 0.4 MG SL tablet Place 1 tablet (0.4 mg total) under the tongue every 5 (five) minutes x 3 doses as needed for chest pain. 40 tablet 1   pantoprazole (PROTONIX) 40 MG tablet Take 1 tablet (40 mg total) by mouth daily. 90 tablet 1  potassium chloride SA (KLOR-CON M) 20 MEQ tablet Take 1 tablet (20 mEq total) by mouth daily. 90 tablet 1   promethazine (PHENERGAN) 25 MG tablet Take 1 tablet (25 mg total) by mouth every 8 (eight) hours as needed for nausea or vomiting. 20 tablet 0   rosuvastatin (CRESTOR) 20 MG tablet Take 1 tablet (20 mg total) by mouth daily. Patient needs an appointment for further refills. 3 rd/final attempt 15 tablet 0   tiZANidine (ZANAFLEX) 2 MG tablet Take 1/2 - 2 tablets (1-4 mg total) by mouth 2 (two) times daily as needed for muscle spasms. 30 tablet 1   traMADol (ULTRAM) 50 MG tablet Take 1 tablet (50 mg total) by mouth 3 (three) times daily as needed. 90 tablet 0   Accu-Chek FastClix Lancets MISC USE AS DIRECTED TWICE DAILY TO CHECK BLOOD SUGAR. DX E11.9 (Patient taking differently: 1 each by Other route See admin instructions. Use as directed twice daily to check blood sugar.  DX E11.9) 102 each 6   cefdinir (OMNICEF) 300 MG capsule Take 1 capsule (300 mg total) by mouth 2 (two) times daily. 14 capsule 0   COVID-19 mRNA vaccine 2023-2024 (COMIRNATY) syringe Inject into the muscle. 0.3 mL 0   hydrochlorothiazide (HYDRODIURIL) 25 MG tablet Take 1 tablet (25 mg total) by mouth daily. 90 tablet 1   Semaglutide, 2 MG/DOSE, (OZEMPIC, 2 MG/DOSE,) 8 MG/3ML SOPN Inject 2 mg into the skin once a week. 9 mL 1   No facility-administered medications prior to visit.    Allergies  Allergen Reactions   Farxiga [Dapagliflozin] Hives    Review of  Systems  Constitutional:  Negative for chills, fever and malaise/fatigue.  HENT:  Negative for congestion and hearing loss.   Eyes:  Negative for discharge.  Respiratory:  Negative for cough, sputum production and shortness of breath.   Cardiovascular:  Negative for chest pain, palpitations and leg swelling.  Gastrointestinal:  Negative for abdominal pain, blood in stool, constipation, diarrhea, heartburn, nausea and vomiting.  Genitourinary:  Negative for dysuria, frequency, hematuria and urgency.  Musculoskeletal:  Positive for joint pain. Negative for back pain, falls and myalgias.  Skin:  Negative for rash.  Neurological:  Negative for dizziness, sensory change, loss of consciousness, weakness and headaches.  Endo/Heme/Allergies:  Negative for environmental allergies. Does not bruise/bleed easily.  Psychiatric/Behavioral:  Negative for depression and suicidal ideas. The patient is not nervous/anxious and does not have insomnia.        Objective:    Physical Exam Constitutional:      General: She is not in acute distress.    Appearance: Normal appearance. She is not diaphoretic.  HENT:     Head: Normocephalic and atraumatic.     Right Ear: Tympanic membrane, ear canal and external ear normal.     Left Ear: Tympanic membrane, ear canal and external ear normal.     Nose: Nose normal.     Mouth/Throat:     Mouth: Mucous membranes are moist.     Pharynx: Oropharynx is clear. No oropharyngeal exudate.  Eyes:     General: No scleral icterus.       Right eye: No discharge.        Left eye: No discharge.     Conjunctiva/sclera: Conjunctivae normal.     Pupils: Pupils are equal, round, and reactive to light.  Neck:     Thyroid: No thyromegaly.  Cardiovascular:     Rate and Rhythm: Normal rate and regular rhythm.  Heart sounds: Normal heart sounds. No murmur heard. Pulmonary:     Effort: Pulmonary effort is normal. No respiratory distress.     Breath sounds: Normal breath  sounds. No wheezing or rales.  Abdominal:     General: Bowel sounds are normal. There is no distension.     Palpations: Abdomen is soft. There is no mass.     Tenderness: There is no abdominal tenderness.  Musculoskeletal:        General: No tenderness. Normal range of motion.     Cervical back: Normal range of motion and neck supple.  Lymphadenopathy:     Cervical: No cervical adenopathy.  Skin:    General: Skin is warm and dry.     Findings: No rash.  Neurological:     General: No focal deficit present.     Mental Status: She is alert and oriented to person, place, and time.     Cranial Nerves: No cranial nerve deficit.     Coordination: Coordination normal.     Deep Tendon Reflexes: Reflexes are normal and symmetric. Reflexes normal.  Psychiatric:        Mood and Affect: Mood normal.        Behavior: Behavior normal.        Thought Content: Thought content normal.        Judgment: Judgment normal.     BP 128/78 (BP Location: Left Arm, Patient Position: Sitting, Cuff Size: Normal)   Pulse 84   Temp 98 F (36.7 C) (Oral)   Resp 16   Ht 5\' 4"  (1.626 m)   Wt 234 lb (106.1 kg)   SpO2 96%   BMI 40.17 kg/m  Wt Readings from Last 3 Encounters:  05/23/23 234 lb (106.1 kg)  01/19/23 234 lb (106.1 kg)  10/17/22 229 lb (103.9 kg)    Diabetic Foot Exam - Simple   No data filed    Lab Results  Component Value Date   WBC 12.7 (H) 01/19/2023   HGB 13.9 01/19/2023   HCT 42.2 01/19/2023   PLT 528.0 (H) 01/19/2023   GLUCOSE 93 01/19/2023   CHOL 138 01/19/2023   TRIG 109.0 01/19/2023   HDL 44.80 01/19/2023   LDLDIRECT 100.0 08/03/2017   LDLCALC 71 01/19/2023   ALT 32 01/19/2023   AST 32 01/19/2023   NA 140 01/19/2023   K 4.2 01/19/2023   CL 99 01/19/2023   CREATININE 0.85 01/19/2023   BUN 14 01/19/2023   CO2 34 (H) 01/19/2023   TSH 1.73 01/19/2023   HGBA1C 7.1 (H) 01/19/2023   MICROALBUR <0.7 01/19/2023    Lab Results  Component Value Date   TSH 1.73  01/19/2023   Lab Results  Component Value Date   WBC 12.7 (H) 01/19/2023   HGB 13.9 01/19/2023   HCT 42.2 01/19/2023   MCV 91.7 01/19/2023   PLT 528.0 (H) 01/19/2023   Lab Results  Component Value Date   NA 140 01/19/2023   K 4.2 01/19/2023   CO2 34 (H) 01/19/2023   GLUCOSE 93 01/19/2023   BUN 14 01/19/2023   CREATININE 0.85 01/19/2023   BILITOT 0.3 01/19/2023   ALKPHOS 87 01/19/2023   AST 32 01/19/2023   ALT 32 01/19/2023   PROT 7.8 01/19/2023   ALBUMIN 4.2 01/19/2023   CALCIUM 9.9 01/19/2023   ANIONGAP 6 03/15/2022   GFR 74.57 01/19/2023   Lab Results  Component Value Date   CHOL 138 01/19/2023   Lab Results  Component Value Date  HDL 44.80 01/19/2023   Lab Results  Component Value Date   LDLCALC 71 01/19/2023   Lab Results  Component Value Date   TRIG 109.0 01/19/2023   Lab Results  Component Value Date   CHOLHDL 3 01/19/2023   Lab Results  Component Value Date   HGBA1C 7.1 (H) 01/19/2023       Assessment & Plan:  Diabetes mellitus type 2 in obese Assessment & Plan: hgba1c acceptable, minimize simple carbs. Increase exercise as tolerated. Continue current meds  Orders: -     Hemoglobin A1c  Essential hypertension Assessment & Plan: Well controlled, no changes to meds. Encouraged heart healthy diet such as the DASH diet and exercise as tolerated.   Orders: -     Comprehensive metabolic panel -     CBC with Differential/Platelet -     TSH  Elevated sed rate Assessment & Plan: monitor   Hyperlipidemia, mixed Assessment & Plan: Encourage heart healthy diet such as MIND or DASH diet, increase exercise, avoid trans fats, simple carbohydrates and processed foods, consider a krill or fish or flaxseed oil cap daily. Tolerating statins  Orders: -     Lipid panel  Myalgia Assessment & Plan: Hydrate and monitor    Preventative health care Assessment & Plan: Patient encouraged to maintain heart healthy diet, regular exercise, adequate  sleep. Consider daily probiotics. Take medications as prescribed. Labs ordered and reviewed. Colonoscopy was 2019 repeat in 2024 referred back to Dr Loreta Ave. Follows with OB/GYN for paps with Dr Cherly Hensen, wil request records, [facility] August 2024 repeat in 1 year.    H/O colonoscopy with polypectomy -     Ambulatory referral to Gastroenterology  Estrogen deficiency -     DG Bone Density; Future  Post-menopausal -     DG Bone Density; Future  Need for pneumococcal 20-valent conjugate vaccination -     Pneumococcal conjugate vaccine 20-valent  Other orders -     Tirzepatide; Inject 10 mg into the skin once a week.  Dispense: 6 mL; Refill: 1    Assessment and Plan    Skin Tag Located on the right side of the neck at the base. Not serious, but causing discomfort. Discussed options for removal including over-the-counter kits and twisting to cut off blood supply. -No referral to dermatology needed at this time.  Diabetes Recent blood glucose readings in the 200s, currently at 134. Discussed the impact of steroids on blood glucose levels and the importance of dietary management, particularly carbohydrate intake. -Check A1c today. Switch to [facility]. response to Ozempic has been suboptimal. start at 10 mg weekly then increase to 12.5 mg weekly as needed and as tolerated after 4-8 weeks   Hyperlipidemia Discussed potential addition of Welchol to current regimen of Lipitor, Zetia, and fish oil. -Consider starting Welchol, repeat labs in a few months.  Preventive Health Due for colonoscopy this year (last one in 2019 with polyps found), recent mammogram, and due for bone density scan. -Schedule colonoscopy.  -Check labs in three months.   Vaccinations Up to date on shingles vaccine, recently had mammogram, due for flu shot in about a month. -Plan to get flu shot at work. -Consider Prevnar 20 vaccine.         Danise Edge, MD

## 2023-05-25 ENCOUNTER — Other Ambulatory Visit: Payer: Self-pay

## 2023-05-25 DIAGNOSIS — I10 Essential (primary) hypertension: Secondary | ICD-10-CM

## 2023-05-26 ENCOUNTER — Telehealth (HOSPITAL_BASED_OUTPATIENT_CLINIC_OR_DEPARTMENT_OTHER): Payer: Self-pay

## 2023-06-01 ENCOUNTER — Ambulatory Visit (HOSPITAL_BASED_OUTPATIENT_CLINIC_OR_DEPARTMENT_OTHER)
Admission: RE | Admit: 2023-06-01 | Discharge: 2023-06-01 | Disposition: A | Discharge status: Home / Self Care | Payer: Commercial Managed Care - PPO | Source: Ambulatory Visit | Attending: Family Medicine | Admitting: Family Medicine

## 2023-06-01 DIAGNOSIS — E2839 Other primary ovarian failure: Secondary | ICD-10-CM | POA: Diagnosis not present

## 2023-06-01 DIAGNOSIS — Z780 Asymptomatic menopausal state: Secondary | ICD-10-CM | POA: Insufficient documentation

## 2023-06-02 ENCOUNTER — Other Ambulatory Visit (HOSPITAL_COMMUNITY): Payer: Self-pay

## 2023-06-12 ENCOUNTER — Telehealth: Payer: Self-pay | Admitting: Family Medicine

## 2023-06-12 NOTE — Telephone Encounter (Signed)
Initial Comment Caller states she has been having pain under her arm for a week now. Translation No Nurse Assessment Nurse: Howell Pringle, RN, Cala Bradford Date/Time (Eastern Time): 06/12/2023 12:44:04 PM Confirm and document reason for call. If symptomatic, describe symptoms. ---Caller states she has been having pain under her left arm x1 week now. S/S: went away now- No new or worsening symptoms currently. Does the patient have any new or worsening symptoms? ---No Please document clinical information provided and list any resource used. ---Clinical call Advise to call the office back and ask to be examined Appointment in November Disp. Time Lamount Cohen Time) Disposition Final User 06/12/2023 12:46:39 PM Clinical Call Yes Howell Pringle, RN, Cala Bradford Final Disposition 06/12/2023 12:46:39 PM Clinical Call Yes Stripling, RN, Cala Bradford

## 2023-06-12 NOTE — Telephone Encounter (Signed)
FYI: This call has been transferred to triage nurse: the Triage Nurse. Once the result note has been entered staff can address the message at that time.  Patient called in with the following symptoms:  Red Word:chest pain and Headache   Please advise at Mobile [phone] (mobile)  Message is routed to Provider Pool.

## 2023-06-12 NOTE — Telephone Encounter (Signed)
Called pt to follow up on pain under her arm and she scheduled for tomorrow with  Efraim Kaufmann, NP.

## 2023-06-13 ENCOUNTER — Ambulatory Visit: Payer: Commercial Managed Care - PPO | Admitting: Family

## 2023-06-13 ENCOUNTER — Other Ambulatory Visit (HOSPITAL_COMMUNITY): Payer: Self-pay

## 2023-06-13 ENCOUNTER — Other Ambulatory Visit (HOSPITAL_BASED_OUTPATIENT_CLINIC_OR_DEPARTMENT_OTHER): Payer: Self-pay

## 2023-06-13 VITALS — BP 136/75 | HR 79 | Temp 97.8°F | Resp 16 | Wt 235.0 lb

## 2023-06-13 DIAGNOSIS — R0789 Other chest pain: Secondary | ICD-10-CM | POA: Insufficient documentation

## 2023-06-13 MED ORDER — COVID-19 MRNA VAC-TRIS(PFIZER) 30 MCG/0.3ML IM SUSY
0.3000 mL | PREFILLED_SYRINGE | Freq: Once | INTRAMUSCULAR | 0 refills | Status: AC
  Filled 2023-06-13: qty 0.3, 1d supply, fill #0

## 2023-06-13 MED ORDER — MELOXICAM 7.5 MG PO TABS
7.5000 mg | ORAL_TABLET | Freq: Every day | ORAL | 0 refills | Status: DC
Start: 2023-06-13 — End: 2023-09-28
  Filled 2023-06-13: qty 14, 14d supply, fill #0

## 2023-06-13 NOTE — Assessment & Plan Note (Signed)
New.  Presentation does not appear cardiac- appears musculoskeletal. Of note she had a cardiac CT calcium score last year of 0 percent. Trial of meloxicam, salon pas patches. Call if symptoms worsen or if symptoms do not improve.

## 2023-06-13 NOTE — Progress Notes (Signed)
Subjective:     Patient ID: Eileen Richards, female    DOB: [birth year], 62 y.o.   MRN: [id number]  Chief Complaint  Patient presents with   Pain    Patient complains of pain above left breast since last Friday     HPI  Discussed the use of AI scribe software for clinical note transcription with the patient, who gave verbal consent to proceed.  History of Present Illness   [name], an employee at a hospital, presents with left-sided chest pain that she first noticed last Friday. The pain is intermittent and was not present over the weekend. The pain is located high on her chest, near her armpit, and is not tender to touch or exacerbated by movement. She denies any recent heavy lifting or strenuous activity that could have caused a muscle strain. She has no history of heart disease and a recent CT scan showed no calcification on her coronary arteries, significantly reducing the likelihood of a cardiac cause for her pain.          Health Maintenance Due  Topic Date Due   FOOT EXAM  06/09/2022    Past Medical History:  Diagnosis Date   Achilles tendonitis 10/14/2012   Asthma    childhood   Atypical chest pain    normal stress echo 02/2009 -LV size was normal LV global systolic function was normal- Normal wall motion; no LV regional wall motion abnormalities   Benign paroxysmal positional vertigo 04/26/2015   Chest pain, atypical 08/24/2013   Diabetes mellitus    GERD (gastroesophageal reflux disease)    Hand pain, left 11/10/2015   Hyperlipemia    Hypertension    Neuropathic pain of ankle, left 04/27/2017   Onychomycosis 04/17/2014   Pain of right heel 10/14/2012   Preventative health care 11/22/2015   Sinusitis, acute 01/17/2016   Thoracic outlet syndrome 11/03/2014   Urinary urgency 12/15/2015    Past Surgical History:  Procedure Laterality Date   ABDOMINAL HYSTERECTOMY     CYSTOSCOPY  07/23/2012   Procedure: CYSTOSCOPY;  Surgeon: Serita Kyle, MD;  Location: [facility];   Service: Gynecology;  Laterality: N/A;   DILATION AND CURETTAGE OF UTERUS     Lysis of Adhesions   ROBOTIC ASSISTED LAPAROSCOPIC LYSIS OF ADHESION  07/23/2012   Procedure: ROBOTIC ASSISTED LAPAROSCOPIC LYSIS OF ADHESION;  Surgeon: Serita Kyle, MD;  Location: [facility];  Service: Gynecology;  Laterality: N/A;   ROBOTIC ASSISTED SALPINGO OOPHERECTOMY  07/23/2012   Procedure: ROBOTIC ASSISTED SALPINGO OOPHERECTOMY;  Surgeon: Serita Kyle, MD;  Location: [facility];  Service: Gynecology;  Laterality: Bilateral;    Family History  Problem Relation Age of Onset   GER disease Mother    GER disease Father    Heart disease Father        father has "heart problems"   Anemia Father    Dementia Maternal Aunt    Miscarriages / Stillbirths Maternal Uncle    Depression Paternal Grandmother    Asthma Other    Hypertension Other    Diabetes Other     Social History   Socioeconomic History   Marital status: Single    Spouse name: Not on file   Number of children: Not on file   Years of education: Not on file   Highest education level: Not on file  Occupational History   Not on file  Tobacco Use   Smoking status: Former    Current packs/day: 0.00    Types:  Cigarettes    Quit date: 02/17/2014    Years since quitting: 9.3   Smokeless tobacco: Never  Vaping Use   Vaping status: Never Used  Substance and Sexual Activity   Alcohol use: No   Drug use: No   Sexual activity: Not on file  Other Topics Concern   Not on file  Social History Narrative   Not on file   Social Determinants of Health   Financial Resource Strain: Not on file  Food Insecurity: Not on file  Transportation Needs: Not on file  Physical Activity: Not on file  Stress: Not on file  Social Connections: Not on file  Intimate Partner Violence: Not on file    Outpatient Medications Prior to Visit  Medication Sig Dispense Refill   albuterol (VENTOLIN HFA) 108 (90 Base) MCG/ACT inhaler Inhale 2 puffs into the  lungs every 6 (six) hours as needed for wheezing or shortness of breath. 6.7 g 5   aspirin 81 MG tablet Take 81 mg by mouth daily.     Blood Pressure Monitoring (OMRON 3 SERIES BP MONITOR) DEVI Use as directed 1 each 0   carvedilol (COREG) 3.125 MG tablet Take 1 tablet (3.125 mg total) by mouth 2 (two) times daily. 180 tablet 1   cetirizine (ZYRTEC) 10 MG tablet TAKE 1 TABLET BY MOUTH 2 TIMES DAILY. (Patient taking differently: Take 10 mg by mouth 2 (two) times daily.) 200 tablet 1   Continuous Blood Gluc Receiver (FREESTYLE LIBRE 2 READER) DEVI USE AS DIRECTED. (Patient taking differently: 1 each by Other route See admin instructions. Glucose reading) 1 each 1   Continuous Glucose Sensor (FREESTYLE LIBRE 2 SENSOR) MISC Use as directed 2 each 3   COVID-19 mRNA vaccine, Pfizer, (COMIRNATY) syringe Inject 0.3 mLs into the muscle once for 1 dose. 0.3 mL 0   cycloSPORINE (RESTASIS) 0.05 % ophthalmic emulsion Instill 1 drop into both eyes twice a day 180 each 3   diclofenac Sodium (VOLTAREN) 1 % GEL Apply 2 g topically 4 (four) times daily. Rub into affected area of foot 2 to 4 times daily (Patient taking differently: Apply 2 g topically as needed (Joint pain). Rub into affected area of foot 2 to 4 times daily) 100 g 2   famotidine (PEPCID) 20 MG tablet Take 1 tablet (20 mg total) by mouth 2 (two) times daily. 180 tablet 1   Fluticasone Furoate (ARNUITY ELLIPTA) 100 MCG/ACT AEPB Inhale 1 puff into the lungs daily. 30 each 3   gabapentin (NEURONTIN) 300 MG capsule Take 1-2 capsules (300-600 mg total) by mouth 3 (three) times daily. 180 capsule 3   insulin glargine-yfgn (SEMGLEE, YFGN,) 100 UNIT/ML Pen Inject 48 Units into the skin every morning. 60 mL 1   Insulin Pen Needle (TECHLITE PEN NEEDLES) 32G X 4 MM MISC Use once daily 100 each 3   Iron-FA-B Cmp-C-Biot-Probiotic (FUSION PLUS) CAPS Take 1 capsule by mouth daily. 30 capsule 11   metFORMIN (GLUCOPHAGE-XR) 500 MG 24 hr tablet Take 4 tablets (2,000 mg  total) by mouth daily. 360 tablet 0   nitroGLYCERIN (NITROSTAT) 0.4 MG SL tablet Place 1 tablet (0.4 mg total) under the tongue every 5 (five) minutes x 3 doses as needed for chest pain. 40 tablet 1   pantoprazole (PROTONIX) 40 MG tablet Take 1 tablet (40 mg total) by mouth daily. 90 tablet 1   potassium chloride SA (KLOR-CON M) 20 MEQ tablet Take 1 tablet (20 mEq total) by mouth daily. 90 tablet 1  promethazine (PHENERGAN) 25 MG tablet Take 1 tablet (25 mg total) by mouth every 8 (eight) hours as needed for nausea or vomiting. 20 tablet 0   rosuvastatin (CRESTOR) 20 MG tablet Take 1 tablet (20 mg total) by mouth daily. Patient needs an appointment for further refills. 3 rd/final attempt 15 tablet 0   tirzepatide (MOUNJARO) 10 MG/0.5ML Pen Inject 10 mg into the skin once a week. 6 mL 1   tiZANidine (ZANAFLEX) 2 MG tablet Take 1/2 - 2 tablets (1-4 mg total) by mouth 2 (two) times daily as needed for muscle spasms. 30 tablet 1   traMADol (ULTRAM) 50 MG tablet Take 1 tablet (50 mg total) by mouth 3 (three) times daily as needed. 90 tablet 0   meloxicam (MOBIC) 15 MG tablet Take 1 tablet (15 mg total) by mouth daily. 30 tablet 0   No facility-administered medications prior to visit.    Allergies  Allergen Reactions   Farxiga [Dapagliflozin] Hives    ROS     Objective:    Physical Exam Constitutional:      General: She is not in acute distress.    Appearance: Normal appearance. She is well-developed.  HENT:     Head: Normocephalic and atraumatic.     Right Ear: External ear normal.     Left Ear: External ear normal.  Eyes:     General: No scleral icterus. Neck:     Thyroid: No thyromegaly.  Cardiovascular:     Rate and Rhythm: Normal rate and regular rhythm.     Heart sounds: Normal heart sounds. No murmur heard. Pulmonary:     Effort: Pulmonary effort is normal. No respiratory distress.     Breath sounds: Normal breath sounds. No wheezing.  Chest:       Comments: Large  pendulous breasts Tenderness to palpation near left anterior axilla, no masses no lymphadenopathy noted.  Musculoskeletal:     Cervical back: Neck supple.  Skin:    General: Skin is warm and dry.  Neurological:     Mental Status: She is alert and oriented to person, place, and time.  Psychiatric:        Mood and Affect: Mood normal.        Behavior: Behavior normal.        Thought Content: Thought content normal.        Judgment: Judgment normal.      BP 136/75   Pulse 79   Temp 97.8 F (36.6 C) (Oral)   Resp 16   Wt 235 lb (106.6 kg)   SpO2 100%   BMI 40.34 kg/m  Wt Readings from Last 3 Encounters:  06/13/23 235 lb (106.6 kg)  05/23/23 234 lb (106.1 kg)  01/19/23 234 lb (106.1 kg)       Assessment & Plan:   Problem List Items Addressed This Visit       Unprioritized   Musculoskeletal chest pain - Primary    New.  Presentation does not appear cardiac- appears musculoskeletal. Of note she had a cardiac CT calcium score last year of 0 percent. Trial of meloxicam, salon pas patches. Call if symptoms worsen or if symptoms do not improve.        I have discontinued Ellinore Hermann's meloxicam. I am also having her start on meloxicam. Additionally, I am having her maintain her aspirin, promethazine, nitroGLYCERIN, cetirizine, diclofenac Sodium, FreeStyle Libre 2 Reader, insulin glargine-yfgn, gabapentin, Arnuity Ellipta, tiZANidine, albuterol, metFORMIN, famotidine, pantoprazole, carvedilol, rosuvastatin, traMADol, cycloSPORINE, potassium chloride SA,  FreeStyle Libre 2 Sensor, Fusion Plus, TechLite Pen Needles, Omron 3 Series BP Monitor, tirzepatide, and COVID-19 mRNA vaccine [employer]).  Meds ordered this encounter  Medications   meloxicam (MOBIC) 7.5 MG tablet    Sig: Take 1 tablet (7.5 mg total) by mouth daily.    Dispense:  14 tablet    Refill:  0    Order Specific Question:   Supervising Provider    Answer:   Danise Edge A [[id number]]

## 2023-06-13 NOTE — Patient Instructions (Signed)
VISIT SUMMARY:  Dear Eileen Richards, during your visit, we discussed the left-sided chest pain you've been experiencing since last Friday. The pain is located high on your chest, near your armpit, and does not worsen with movement or touch.  Your pain is most likely due to a muscle strain.   YOUR PLAN:  -CHEST PAIN: We believe your chest pain is likely due to muscle strain, not a heart condition. To help manage this, we've prescribed Meloxicam, an anti-inflammatory medication, for two weeks. If your symptoms improve within 3-4 days, you can stop taking the medication and keep the remainder for future use. We also recommend using over-the-counter Salon pas patches for local relief during work hours. If your symptoms worsen or you develop shortness of breath, please seek emergency care immediately.  INSTRUCTIONS:  You have a follow-up appointment scheduled with Dr. Abner Greenspan in January. Please continue to monitor your symptoms and report any changes or concerns at your next appointment.

## 2023-06-15 ENCOUNTER — Other Ambulatory Visit: Payer: Self-pay | Admitting: Family Medicine

## 2023-06-16 ENCOUNTER — Other Ambulatory Visit (HOSPITAL_COMMUNITY): Payer: Self-pay

## 2023-06-16 MED ORDER — METFORMIN HCL ER 500 MG PO TB24
2000.0000 mg | ORAL_TABLET | Freq: Every day | ORAL | 0 refills | Status: DC
  Filled 2023-06-16: qty 360, 90d supply, fill #0

## 2023-07-05 ENCOUNTER — Other Ambulatory Visit (HOSPITAL_COMMUNITY): Payer: Self-pay

## 2023-07-17 ENCOUNTER — Other Ambulatory Visit (HOSPITAL_COMMUNITY): Payer: Self-pay

## 2023-07-18 ENCOUNTER — Other Ambulatory Visit (HOSPITAL_COMMUNITY): Payer: Self-pay

## 2023-07-19 ENCOUNTER — Other Ambulatory Visit: Payer: Self-pay

## 2023-07-19 ENCOUNTER — Other Ambulatory Visit (HOSPITAL_COMMUNITY): Payer: Self-pay

## 2023-07-27 ENCOUNTER — Ambulatory Visit: Payer: Commercial Managed Care - PPO | Admitting: Family Medicine

## 2023-08-09 ENCOUNTER — Other Ambulatory Visit: Payer: Self-pay | Admitting: Internal Medicine

## 2023-08-09 ENCOUNTER — Other Ambulatory Visit: Payer: Self-pay

## 2023-08-09 ENCOUNTER — Other Ambulatory Visit (HOSPITAL_COMMUNITY): Payer: Self-pay

## 2023-08-09 ENCOUNTER — Other Ambulatory Visit: Payer: Self-pay | Admitting: Family Medicine

## 2023-08-09 MED ORDER — FREESTYLE LIBRE 2 SENSOR MISC
3 refills | Status: DC
  Filled 2023-08-09: qty 2, 28d supply, fill #0
  Filled 2023-09-19: qty 2, 28d supply, fill #1
  Filled 2023-10-16: qty 2, 28d supply, fill #2
  Filled 2023-11-16: qty 2, 28d supply, fill #3

## 2023-08-09 MED ORDER — INSULIN GLARGINE-YFGN 100 UNIT/ML ~~LOC~~ SOPN
48.0000 [IU] | PEN_INJECTOR | SUBCUTANEOUS | 1 refills | Status: DC
  Filled 2023-08-09: qty 42, 87d supply, fill #0
  Filled 2023-11-06: qty 15, 31d supply, fill #1
  Filled 2023-11-07: qty 42, 87d supply, fill #1
  Filled 2023-11-07 – 2023-11-08 (×2): qty 15, 31d supply, fill #1

## 2023-08-09 NOTE — Telephone Encounter (Signed)
Semglee refill request complete

## 2023-09-09 ENCOUNTER — Other Ambulatory Visit: Payer: Self-pay | Admitting: Family Medicine

## 2023-09-11 ENCOUNTER — Other Ambulatory Visit (HOSPITAL_COMMUNITY): Payer: Self-pay

## 2023-09-11 MED ORDER — METFORMIN HCL ER 500 MG PO TB24
2000.0000 mg | ORAL_TABLET | Freq: Every day | ORAL | 0 refills | Status: DC
  Filled 2023-09-19: qty 360, 90d supply, fill #0

## 2023-09-19 ENCOUNTER — Other Ambulatory Visit (HOSPITAL_COMMUNITY): Payer: Self-pay

## 2023-09-19 ENCOUNTER — Other Ambulatory Visit: Payer: Self-pay

## 2023-09-27 NOTE — Assessment & Plan Note (Signed)
Encourage heart healthy diet such as MIND or DASH diet, increase exercise, avoid trans fats, simple carbohydrates and processed foods, consider a krill or fish or flaxseed oil cap daily. Tolerating statins 

## 2023-09-27 NOTE — Assessment & Plan Note (Signed)
 hgba1c acceptable, minimize simple carbs. Increase exercise as tolerated. Continue current meds

## 2023-09-27 NOTE — Assessment & Plan Note (Signed)
 Hydrate and monitor

## 2023-09-28 ENCOUNTER — Encounter: Payer: Self-pay | Admitting: Family Medicine

## 2023-09-28 ENCOUNTER — Other Ambulatory Visit (HOSPITAL_COMMUNITY): Payer: Self-pay

## 2023-09-28 ENCOUNTER — Other Ambulatory Visit (HOSPITAL_BASED_OUTPATIENT_CLINIC_OR_DEPARTMENT_OTHER): Payer: Self-pay

## 2023-09-28 ENCOUNTER — Ambulatory Visit: Payer: Commercial Managed Care - PPO | Admitting: Family Medicine

## 2023-09-28 VITALS — BP 132/78 | HR 86 | Temp 98.2°F | Resp 18 | Ht 64.0 in | Wt 230.8 lb

## 2023-09-28 DIAGNOSIS — E1169 Type 2 diabetes mellitus with other specified complication: Secondary | ICD-10-CM

## 2023-09-28 DIAGNOSIS — E669 Obesity, unspecified: Secondary | ICD-10-CM | POA: Diagnosis not present

## 2023-09-28 DIAGNOSIS — E782 Mixed hyperlipidemia: Secondary | ICD-10-CM | POA: Diagnosis not present

## 2023-09-28 DIAGNOSIS — M791 Myalgia, unspecified site: Secondary | ICD-10-CM

## 2023-09-28 DIAGNOSIS — Z7984 Long term (current) use of oral hypoglycemic drugs: Secondary | ICD-10-CM

## 2023-09-28 DIAGNOSIS — R7989 Other specified abnormal findings of blood chemistry: Secondary | ICD-10-CM | POA: Diagnosis not present

## 2023-09-28 MED ORDER — AREXVY 120 MCG/0.5ML IM SUSR
0.5000 mL | Freq: Once | INTRAMUSCULAR | 0 refills | Status: AC
  Filled 2023-09-28: qty 0.5, 1d supply, fill #0

## 2023-09-28 MED ORDER — FLUTICASONE PROPIONATE HFA 110 MCG/ACT IN AERO
1.0000 | INHALATION_SPRAY | Freq: Two times a day (BID) | RESPIRATORY_TRACT | 5 refills | Status: DC
  Filled 2023-09-28: qty 12, 60d supply, fill #0
  Filled 2024-02-02: qty 12, 60d supply, fill #1
  Filled 2024-06-24: qty 12, 60d supply, fill #2
  Filled 2024-07-14 – 2024-08-12 (×3): qty 12, 60d supply, fill #3

## 2023-09-28 MED ORDER — MELOXICAM 7.5 MG PO TABS
7.5000 mg | ORAL_TABLET | Freq: Every day | ORAL | 2 refills | Status: DC
  Filled 2023-09-28: qty 60, 30d supply, fill #0
  Filled 2024-03-07: qty 60, 30d supply, fill #1
  Filled 2024-04-24: qty 60, 30d supply, fill #2

## 2023-09-28 MED ORDER — TIRZEPATIDE 12.5 MG/0.5ML ~~LOC~~ SOAJ
12.5000 mg | SUBCUTANEOUS | 2 refills | Status: DC
  Filled 2023-09-28: qty 6, 84d supply, fill #0
  Filled 2023-10-19: qty 2, 28d supply, fill #0
  Filled 2023-11-09 – 2023-11-13 (×2): qty 2, 28d supply, fill #1
  Filled 2023-12-10: qty 2, 28d supply, fill #2
  Filled 2024-01-03: qty 2, 28d supply, fill #3
  Filled 2024-02-02: qty 2, 28d supply, fill #4
  Filled 2024-03-07: qty 2, 28d supply, fill #5
  Filled 2024-04-08: qty 2, 28d supply, fill #6
  Filled 2024-05-10 – 2024-05-30 (×2): qty 2, 28d supply, fill #7
  Filled 2024-06-24: qty 2, 28d supply, fill #8

## 2023-09-28 NOTE — Progress Notes (Signed)
 Subjective:    Patient ID: Eileen Richards, female    DOB: 24-Jul-1961, 63 y.o.   MRN: [id number]  Chief Complaint  Patient presents with   Follow-up    HPI Discussed the use of AI scribe software for clinical note transcription with the patient, who gave verbal consent to proceed.  History of Present Illness   The patient, with a history of asthma and diabetes, presents for a follow-up visit. They have been experiencing difficulty with their current asthma medication, a powdered inhaler, and are seeking an alternative. They have previously tolerated inhalers like albuterol  well. They have also been experiencing frequent flares of their asthma symptoms, particularly during strenuous shifts at work.  In addition to their asthma, the patient has been experiencing loose bowel movements. They are unsure if this is due to their diabetes medication, metformin , or their new medication, Mounjaro . The patient reports that the loose bowel movements have been worsening over time. They have been taking two metformin  pills in the morning and two at night.        Past Medical History:  Diagnosis Date   Achilles tendonitis 10/14/2012   Asthma    childhood   Atypical chest pain    normal stress echo 02/2009 -LV size was normal LV global systolic function was normal- Normal wall motion; no LV regional wall motion abnormalities   Benign paroxysmal positional vertigo 04/26/2015   Chest pain, atypical 08/24/2013   Diabetes mellitus    GERD (gastroesophageal reflux disease)    Hand pain, left 11/10/2015   Hyperlipemia    Hypertension    Neuropathic pain of ankle, left 04/27/2017   Onychomycosis 04/17/2014   Pain of right heel 10/14/2012   Preventative health care 11/22/2015   Sinusitis, acute 01/17/2016   Thoracic outlet syndrome 11/03/2014   Urinary urgency 12/15/2015    Past Surgical History:  Procedure Laterality Date   ABDOMINAL HYSTERECTOMY     CYSTOSCOPY  07/23/2012   Procedure: CYSTOSCOPY;   Surgeon: Dickie DELENA Carder, MD;  Location: [facility];  Service: Gynecology;  Laterality: N/A;   DILATION AND CURETTAGE OF UTERUS     Lysis of Adhesions   ROBOTIC ASSISTED LAPAROSCOPIC LYSIS OF ADHESION  07/23/2012   Procedure: ROBOTIC ASSISTED LAPAROSCOPIC LYSIS OF ADHESION;  Surgeon: Dickie DELENA Carder, MD;  Location: [facility];  Service: Gynecology;  Laterality: N/A;   ROBOTIC ASSISTED SALPINGO OOPHERECTOMY  07/23/2012   Procedure: ROBOTIC ASSISTED SALPINGO OOPHERECTOMY;  Surgeon: Dickie DELENA Carder, MD;  Location: [facility];  Service: Gynecology;  Laterality: Bilateral;    Family History  Problem Relation Age of Onset   GER disease Mother    GER disease Father    Heart disease Father        father has heart problems   Anemia Father    Dementia Maternal Aunt    Miscarriages / Stillbirths Maternal Uncle    Depression Paternal Grandmother    Asthma Other    Hypertension Other    Diabetes Other     Social History   Socioeconomic History   Marital status: Single    Spouse name: Not on file   Number of children: Not on file   Years of education: Not on file   Highest education level: Not on file  Occupational History   Not on file  Tobacco Use   Smoking status: Former    Current packs/day: 0.00    Types: Cigarettes    Quit date: 02/17/2014    Years since quitting: 9.6  Smokeless tobacco: Never  Vaping Use   Vaping status: Never Used  Substance and Sexual Activity   Alcohol use: No   Drug use: No   Sexual activity: Not on file  Other Topics Concern   Not on file  Social History Narrative   Not on file   Social Drivers of Health   Financial Resource Strain: Not on file  Food Insecurity: Not on file  Transportation Needs: Not on file  Physical Activity: Not on file  Stress: Not on file  Social Connections: Not on file  Intimate Partner Violence: Not on file    Outpatient Medications Prior to Visit  Medication Sig Dispense Refill   albuterol  (VENTOLIN  HFA) 108  (90 Base) MCG/ACT inhaler Inhale 2 puffs into the lungs every 6 (six) hours as needed for wheezing or shortness of breath. 6.7 g 5   aspirin 81 MG tablet Take 81 mg by mouth daily.     Blood Pressure Monitoring (OMRON 3 SERIES BP MONITOR) DEVI Use as directed 1 each 0   carvedilol  (COREG ) 3.125 MG tablet Take 1 tablet (3.125 mg total) by mouth 2 (two) times daily. 180 tablet 1   cetirizine  (ZYRTEC ) 10 MG tablet TAKE 1 TABLET BY MOUTH 2 TIMES DAILY. (Patient taking differently: Take 10 mg by mouth 2 (two) times daily.) 200 tablet 1   Continuous Blood Gluc Receiver (FREESTYLE LIBRE 2 READER) DEVI USE AS DIRECTED. (Patient taking differently: 1 each by Other route See admin instructions. Glucose reading) 1 each 1   Continuous Glucose Sensor (FREESTYLE LIBRE 2 SENSOR) MISC Check blood sugars as directed 2 each 3   cycloSPORINE  (RESTASIS ) 0.05 % ophthalmic emulsion Instill 1 drop into both eyes twice a day 180 each 3   diclofenac  Sodium (VOLTAREN ) 1 % GEL Apply 2 g topically 4 (four) times daily. Rub into affected area of foot 2 to 4 times daily (Patient taking differently: Apply 2 g topically as needed (Joint pain). Rub into affected area of foot 2 to 4 times daily) 100 g 2   famotidine  (PEPCID ) 20 MG tablet Take 1 tablet (20 mg total) by mouth 2 (two) times daily. 180 tablet 1   gabapentin  (NEURONTIN ) 300 MG capsule Take 1-2 capsules (300-600 mg total) by mouth 3 (three) times daily. 180 capsule 3   insulin  glargine-yfgn (SEMGLEE , YFGN,) 100 UNIT/ML Pen Inject 48 Units into the skin every morning. 60 mL 1   Insulin  Pen Needle (TECHLITE PEN NEEDLES) 32G X 4 MM MISC Use once daily 100 each 3   Iron -FA-B Cmp-C-Biot-Probiotic (FUSION PLUS) CAPS Take 1 capsule by mouth daily. 30 capsule 11   metFORMIN  (GLUCOPHAGE -XR) 500 MG 24 hr tablet Take 4 tablets (2,000 mg total) by mouth daily. 360 tablet 0   nitroGLYCERIN  (NITROSTAT ) 0.4 MG SL tablet Place 1 tablet (0.4 mg total) under the tongue every 5 (five) minutes  x 3 doses as needed for chest pain. 40 tablet 1   pantoprazole  (PROTONIX ) 40 MG tablet Take 1 tablet (40 mg total) by mouth daily. 90 tablet 1   potassium chloride  SA (KLOR-CON  M) 20 MEQ tablet Take 1 tablet (20 mEq total) by mouth daily. 90 tablet 1   promethazine  (PHENERGAN ) 25 MG tablet Take 1 tablet (25 mg total) by mouth every 8 (eight) hours as needed for nausea or vomiting. 20 tablet 0   rosuvastatin  (CRESTOR ) 20 MG tablet Take 1 tablet (20 mg total) by mouth daily. Patient needs an appointment for further refills. 3 rd/final attempt 15 tablet  0   tiZANidine  (ZANAFLEX ) 2 MG tablet Take 1/2 - 2 tablets (1-4 mg total) by mouth 2 (two) times daily as needed for muscle spasms. 30 tablet 1   traMADol  (ULTRAM ) 50 MG tablet Take 1 tablet (50 mg total) by mouth 3 (three) times daily as needed. 90 tablet 0   Fluticasone  Furoate (ARNUITY ELLIPTA ) 100 MCG/ACT AEPB Inhale 1 puff into the lungs daily. 30 each 3   meloxicam  (MOBIC ) 7.5 MG tablet Take 1 tablet (7.5 mg total) by mouth daily. 14 tablet 0   tirzepatide  (MOUNJARO ) 10 MG/0.5ML Pen Inject 10 mg into the skin once a week. 6 mL 1   No facility-administered medications prior to visit.    Allergies  Allergen Reactions   Farxiga  [Dapagliflozin ] Hives    Review of Systems  Constitutional:  Negative for fever and malaise/fatigue.  HENT:  Negative for congestion.   Eyes:  Negative for blurred vision.  Respiratory:  Negative for shortness of breath.   Cardiovascular:  Negative for chest pain, palpitations and leg swelling.  Gastrointestinal:  Negative for abdominal pain, blood in stool and nausea.  Genitourinary:  Negative for dysuria, frequency and hematuria.  Musculoskeletal:  Positive for neck pain. Negative for falls.  Skin:  Negative for rash.  Neurological:  Positive for tingling. Negative for dizziness, loss of consciousness and headaches.  Endo/Heme/Allergies:  Negative for environmental allergies.  Psychiatric/Behavioral:  Negative  for depression. The patient is not nervous/anxious.        Objective:    Physical Exam Constitutional:      General: She is not in acute distress.    Appearance: Normal appearance. She is well-developed. She is not toxic-appearing.  HENT:     Head: Normocephalic and atraumatic.     Right Ear: External ear normal.     Left Ear: External ear normal.     Nose: Nose normal.  Eyes:     General:        Right eye: No discharge.        Left eye: No discharge.     Conjunctiva/sclera: Conjunctivae normal.  Neck:     Thyroid : No thyromegaly.  Cardiovascular:     Rate and Rhythm: Normal rate and regular rhythm.     Heart sounds: Normal heart sounds. No murmur heard. Pulmonary:     Effort: Pulmonary effort is normal. No respiratory distress.     Breath sounds: Normal breath sounds.  Abdominal:     General: Bowel sounds are normal.     Palpations: Abdomen is soft.     Tenderness: There is no abdominal tenderness. There is no guarding.  Musculoskeletal:        General: Normal range of motion.     Cervical back: Neck supple.  Lymphadenopathy:     Cervical: No cervical adenopathy.  Skin:    General: Skin is warm and dry.  Neurological:     Mental Status: She is alert and oriented to person, place, and time.  Psychiatric:        Mood and Affect: Mood normal.        Behavior: Behavior normal.        Thought Content: Thought content normal.        Judgment: Judgment normal.     BP 132/78 (BP Location: Left Arm, Patient Position: Sitting, Cuff Size: Large)   Pulse 86   Temp 98.2 F (36.8 C) (Oral)   Resp 18   Ht 5' 4 (1.626 m)   Wt 230 lb  12.8 oz (104.7 kg)   SpO2 98%   BMI 39.62 kg/m  Wt Readings from Last 3 Encounters:  09/28/23 230 lb 12.8 oz (104.7 kg)  06/13/23 235 lb (106.6 kg)  05/23/23 234 lb (106.1 kg)    Diabetic Foot Exam - Simple   No data filed    Lab Results  Component Value Date   WBC 10.9 (H) 09/28/2023   HGB 13.8 09/28/2023   HCT 42.2 09/28/2023    PLT 576.0 (H) 09/28/2023   GLUCOSE 92 09/28/2023   CHOL 262 (H) 09/28/2023   TRIG 158.0 (H) 09/28/2023   HDL 47.50 09/28/2023   LDLDIRECT 100.0 08/03/2017   LDLCALC 182 (H) 09/28/2023   ALT 16 09/28/2023   AST 18 09/28/2023   NA 140 09/28/2023   K 3.8 09/28/2023   CL 98 09/28/2023   CREATININE 0.70 09/28/2023   BUN 22 09/28/2023   CO2 33 (H) 09/28/2023   TSH 1.34 09/28/2023   HGBA1C 6.7 (H) 09/28/2023   MICROALBUR 0.7 09/28/2023    Lab Results  Component Value Date   TSH 1.34 09/28/2023   Lab Results  Component Value Date   WBC 10.9 (H) 09/28/2023   HGB 13.8 09/28/2023   HCT 42.2 09/28/2023   MCV 92.6 09/28/2023   PLT 576.0 (H) 09/28/2023   Lab Results  Component Value Date   NA 140 09/28/2023   K 3.8 09/28/2023   CO2 33 (H) 09/28/2023   GLUCOSE 92 09/28/2023   BUN 22 09/28/2023   CREATININE 0.70 09/28/2023   BILITOT 0.3 09/28/2023   ALKPHOS 88 09/28/2023   AST 18 09/28/2023   ALT 16 09/28/2023   PROT 7.4 09/28/2023   ALBUMIN 4.4 09/28/2023   CALCIUM  9.6 09/28/2023   ANIONGAP 6 03/15/2022   GFR 93.68 09/28/2023   Lab Results  Component Value Date   CHOL 262 (H) 09/28/2023   Lab Results  Component Value Date   HDL 47.50 09/28/2023   Lab Results  Component Value Date   LDLCALC 182 (H) 09/28/2023   Lab Results  Component Value Date   TRIG 158.0 (H) 09/28/2023   Lab Results  Component Value Date   CHOLHDL 6 09/28/2023   Lab Results  Component Value Date   HGBA1C 6.7 (H) 09/28/2023       Assessment & Plan:  Diabetes mellitus type 2 in obese Assessment & Plan: hgba1c acceptable, minimize simple carbs. Increase exercise as tolerated. Continue current meds  Orders: -     Comprehensive metabolic panel -     Hemoglobin A1c -     TSH -     Microalbumin / creatinine urine ratio  Hyperlipidemia, mixed Assessment & Plan: Encourage heart healthy diet such as MIND or DASH diet, increase exercise, avoid trans fats, simple carbohydrates and  processed foods, consider a krill or fish or flaxseed oil cap daily. Tolerating statins  Orders: -     Lipid panel -     TSH  Myalgia Assessment & Plan: Hydrate and monitor   Orders: -     Magnesium  Elevated platelet count -     CBC with Differential/Platelet  Other orders -     Meloxicam ; Take 1-2 tablets (7.5-15 mg total) by mouth daily.  Dispense: 60 tablet; Refill: 2 -     Tirzepatide ; Inject 12.5 mg into the skin once a week.  Dispense: 6 mL; Refill: 2 -     Fluticasone  Propionate HFA; Inhale 1 puff into the lungs 2 (two) times daily.  Dispense: 12 g; Refill: 5    Assessment and Plan    Asthma Difficulty with powdered inhaler. Tolerates albuterol  inhaler well. Discussed switching to Flovent  inhaler. -Attempt to switch to Flovent  inhaler, pending insurance approval. -Continue use of albuterol  as rescue inhaler and prior to strenuous activity.  Diabetes Loose bowel movements, possibly related to metformin  or Mounjaro . Discussed dietary impact on Mounjaro -related GI symptoms. -Reduce metformin  from 2 tablets BID to 1 tablet BID and monitor blood sugars. -If symptoms persist, further reduce metformin  to 1 tablet daily. -If symptoms still persist, consider discontinuing metformin . -Check A1C today and monitor continuous glucose readings over the next few weeks. -Encouraged healthier dietary choices to potentially improve GI symptoms.  General Health Maintenance -Recommended vaccination today.         Harlene Horton, MD

## 2023-09-28 NOTE — Patient Instructions (Addendum)
 RSV, Respiratory Syncitial Virus Vaccine, Arexvy vaccine  at pharmacy

## 2023-09-29 LAB — MAGNESIUM: Magnesium: 2 mg/dL (ref 1.5–2.5)

## 2023-09-29 LAB — CBC WITH DIFFERENTIAL/PLATELET
Basophils Absolute: 0.1 10*3/uL (ref 0.0–0.1)
Basophils Relative: 1.3 % (ref 0.0–3.0)
Eosinophils Absolute: 0.3 10*3/uL (ref 0.0–0.7)
Eosinophils Relative: 2.6 % (ref 0.0–5.0)
HCT: 42.2 % (ref 36.0–46.0)
Hemoglobin: 13.8 g/dL (ref 12.0–15.0)
Lymphocytes Relative: 26.2 % (ref 12.0–46.0)
Lymphs Abs: 2.9 10*3/uL (ref 0.7–4.0)
MCHC: 32.7 g/dL (ref 30.0–36.0)
MCV: 92.6 fL (ref 78.0–100.0)
Monocytes Absolute: 0.8 10*3/uL (ref 0.1–1.0)
Monocytes Relative: 6.9 % (ref 3.0–12.0)
Neutro Abs: 6.9 10*3/uL (ref 1.4–7.7)
Neutrophils Relative %: 63 % (ref 43.0–77.0)
Platelets: 576 10*3/uL — ABNORMAL HIGH (ref 150.0–400.0)
RBC: 4.56 Mil/uL (ref 3.87–5.11)
RDW: 14 % (ref 11.5–15.5)
WBC: 10.9 10*3/uL — ABNORMAL HIGH (ref 4.0–10.5)

## 2023-09-29 LAB — COMPREHENSIVE METABOLIC PANEL
ALT: 16 U/L (ref 0–35)
AST: 18 U/L (ref 0–37)
Albumin: 4.4 g/dL (ref 3.5–5.2)
Alkaline Phosphatase: 88 U/L (ref 39–117)
BUN: 22 mg/dL (ref 6–23)
CO2: 33 meq/L — ABNORMAL HIGH (ref 19–32)
Calcium: 9.6 mg/dL (ref 8.4–10.5)
Chloride: 98 meq/L (ref 96–112)
Creatinine, Ser: 0.7 mg/dL (ref 0.40–1.20)
GFR: 93.68 mL/min (ref >60.00)
Glucose, Bld: 92 mg/dL (ref 70–99)
Potassium: 3.8 meq/L (ref 3.5–5.1)
Sodium: 140 meq/L (ref 135–145)
Total Bilirubin: 0.3 mg/dL (ref 0.2–1.2)
Total Protein: 7.4 g/dL (ref 6.0–8.3)

## 2023-09-29 LAB — LIPID PANEL
Cholesterol: 262 mg/dL — ABNORMAL HIGH (ref 0–200)
HDL: 47.5 mg/dL (ref >39.00)
LDL Cholesterol: 182 mg/dL — ABNORMAL HIGH (ref 0–99)
NonHDL: 214.06
Total CHOL/HDL Ratio: 6
Triglycerides: 158 mg/dL — ABNORMAL HIGH (ref 0.0–149.0)
VLDL: 31.6 mg/dL (ref 0.0–40.0)

## 2023-09-29 LAB — MICROALBUMIN / CREATININE URINE RATIO
Creatinine,U: 99.8 mg/dL
Microalb Creat Ratio: 0.7 mg/g (ref 0.0–30.0)
Microalb, Ur: 0.7 mg/dL (ref 0.0–1.9)

## 2023-09-29 LAB — HEMOGLOBIN A1C: Hgb A1c MFr Bld: 6.7 % — ABNORMAL HIGH (ref 4.6–6.5)

## 2023-09-29 LAB — TSH: TSH: 1.34 u[IU]/mL (ref 0.35–5.50)

## 2023-10-02 ENCOUNTER — Other Ambulatory Visit: Payer: Self-pay | Admitting: Emergency Medicine

## 2023-10-02 ENCOUNTER — Other Ambulatory Visit (HOSPITAL_COMMUNITY): Payer: Self-pay

## 2023-10-02 DIAGNOSIS — E1169 Type 2 diabetes mellitus with other specified complication: Secondary | ICD-10-CM

## 2023-10-02 DIAGNOSIS — R7989 Other specified abnormal findings of blood chemistry: Secondary | ICD-10-CM

## 2023-10-02 DIAGNOSIS — E782 Mixed hyperlipidemia: Secondary | ICD-10-CM

## 2023-10-02 MED ORDER — ROSUVASTATIN CALCIUM 40 MG PO TABS
40.0000 mg | ORAL_TABLET | Freq: Every day | ORAL | 1 refills | Status: DC
  Filled 2023-10-02: qty 90, 90d supply, fill #0
  Filled 2024-01-03: qty 90, 90d supply, fill #1

## 2023-10-03 ENCOUNTER — Encounter: Payer: Self-pay | Admitting: *Deleted

## 2023-10-03 ENCOUNTER — Telehealth: Payer: Self-pay

## 2023-10-03 ENCOUNTER — Other Ambulatory Visit (HOSPITAL_COMMUNITY): Payer: Self-pay

## 2023-10-03 NOTE — Telephone Encounter (Signed)
 Copied from CRM [id number]. Topic: General - Other >> Oct 03, 2023 11:43 AM Truddie Crumble wrote: Reason for CRM: Pt would like a call from the Dr. Abner Greenspan nurse regarding something that was discussed on yesterday

## 2023-10-04 ENCOUNTER — Other Ambulatory Visit (HOSPITAL_COMMUNITY): Payer: Self-pay

## 2023-10-04 ENCOUNTER — Other Ambulatory Visit: Payer: Self-pay | Admitting: Family Medicine

## 2023-10-04 ENCOUNTER — Other Ambulatory Visit: Payer: Self-pay | Admitting: Emergency Medicine

## 2023-10-04 DIAGNOSIS — Z9889 Other specified postprocedural states: Secondary | ICD-10-CM

## 2023-10-04 MED ORDER — HYDROCHLOROTHIAZIDE 25 MG PO TABS
25.0000 mg | ORAL_TABLET | Freq: Every day | ORAL | 1 refills | Status: DC
  Filled 2023-10-04: qty 90, 90d supply, fill #0
  Filled 2024-01-03: qty 90, 90d supply, fill #1

## 2023-10-04 NOTE — Telephone Encounter (Signed)
 Called to follow up with patient. She called [facility] to follow up with her GI referral with Dr. Tova Fresh from last September. Patient said Dr. Tova Fresh would not see her because she was already a patient at [facility] from 2020 with Dr. Leonia Raman. She would like to know if she could get another referral. Also, she is almost out of hydrochlorothiazide  and would like to know if she can get a refill. When I looked at the med list it wasn't on there

## 2023-10-04 NOTE — Telephone Encounter (Signed)
 Called patient. Made her aware of prescription refill and let her know a new referral has been made with [facility].

## 2023-10-16 ENCOUNTER — Other Ambulatory Visit (HOSPITAL_COMMUNITY): Payer: Self-pay

## 2023-10-16 ENCOUNTER — Other Ambulatory Visit: Payer: Self-pay

## 2023-10-16 ENCOUNTER — Other Ambulatory Visit (INDEPENDENT_AMBULATORY_CARE_PROVIDER_SITE_OTHER): Payer: Commercial Managed Care - PPO

## 2023-10-16 DIAGNOSIS — R7989 Other specified abnormal findings of blood chemistry: Secondary | ICD-10-CM | POA: Diagnosis not present

## 2023-10-17 ENCOUNTER — Encounter: Payer: Self-pay | Admitting: Family Medicine

## 2023-10-17 LAB — CBC WITH DIFFERENTIAL/PLATELET
Basophils Absolute: 0.1 10*3/uL (ref 0.0–0.1)
Basophils Relative: 1.2 % (ref 0.0–3.0)
Eosinophils Absolute: 0.2 10*3/uL (ref 0.0–0.7)
Eosinophils Relative: 1.8 % (ref 0.0–5.0)
HCT: 42.4 % (ref 36.0–46.0)
Hemoglobin: 13.5 g/dL (ref 12.0–15.0)
Lymphocytes Relative: 19.9 % (ref 12.0–46.0)
Lymphs Abs: 2.4 10*3/uL (ref 0.7–4.0)
MCHC: 31.9 g/dL (ref 30.0–36.0)
MCV: 92.3 fL (ref 78.0–100.0)
Monocytes Absolute: 0.7 10*3/uL (ref 0.1–1.0)
Monocytes Relative: 5.8 % (ref 3.0–12.0)
Neutro Abs: 8.7 10*3/uL — ABNORMAL HIGH (ref 1.4–7.7)
Neutrophils Relative %: 71.3 % (ref 43.0–77.0)
Platelets: 535 10*3/uL — ABNORMAL HIGH (ref 150.0–400.0)
RBC: 4.59 Mil/uL (ref 3.87–5.11)
RDW: 14.1 % (ref 11.5–15.5)
WBC: 12.2 10*3/uL — ABNORMAL HIGH (ref 4.0–10.5)

## 2023-10-19 ENCOUNTER — Other Ambulatory Visit: Payer: Self-pay

## 2023-10-19 ENCOUNTER — Other Ambulatory Visit (HOSPITAL_COMMUNITY): Payer: Self-pay

## 2023-11-06 ENCOUNTER — Other Ambulatory Visit (HOSPITAL_COMMUNITY): Payer: Self-pay

## 2023-11-06 ENCOUNTER — Other Ambulatory Visit: Payer: Self-pay | Admitting: Family Medicine

## 2023-11-06 MED ORDER — CARVEDILOL 3.125 MG PO TABS
3.1250 mg | ORAL_TABLET | Freq: Two times a day (BID) | ORAL | 1 refills | Status: DC
  Filled 2023-11-06 (×2): qty 180, 90d supply, fill #0
  Filled 2024-02-02: qty 180, 90d supply, fill #1

## 2023-11-06 MED ORDER — PANTOPRAZOLE SODIUM 40 MG PO TBEC
40.0000 mg | DELAYED_RELEASE_TABLET | Freq: Every day | ORAL | 1 refills | Status: DC
  Filled 2023-11-06: qty 90, 90d supply, fill #0
  Filled 2024-02-02: qty 90, 90d supply, fill #1

## 2023-11-06 MED ORDER — FAMOTIDINE 20 MG PO TABS
20.0000 mg | ORAL_TABLET | Freq: Two times a day (BID) | ORAL | 1 refills | Status: DC
  Filled 2023-11-06: qty 180, 90d supply, fill #0
  Filled 2024-01-03 – 2024-02-02 (×2): qty 180, 90d supply, fill #1

## 2023-11-07 ENCOUNTER — Other Ambulatory Visit: Payer: Self-pay

## 2023-11-07 ENCOUNTER — Other Ambulatory Visit (HOSPITAL_COMMUNITY): Payer: Self-pay

## 2023-11-08 ENCOUNTER — Other Ambulatory Visit (HOSPITAL_COMMUNITY): Payer: Self-pay

## 2023-11-09 ENCOUNTER — Other Ambulatory Visit (HOSPITAL_COMMUNITY): Payer: Self-pay

## 2023-11-09 ENCOUNTER — Other Ambulatory Visit: Payer: Self-pay | Admitting: Internal Medicine

## 2023-11-10 ENCOUNTER — Encounter (HOSPITAL_COMMUNITY): Payer: Self-pay

## 2023-11-10 ENCOUNTER — Other Ambulatory Visit (HOSPITAL_COMMUNITY): Payer: Self-pay

## 2023-11-10 ENCOUNTER — Other Ambulatory Visit: Payer: Self-pay | Admitting: Family Medicine

## 2023-11-10 NOTE — Telephone Encounter (Signed)
 Pharmacy comment: Patient can no longer afford Semglee (manufacturer coupon discontinued), please send for Lantus solostar for patient savings if appropriate.

## 2023-11-11 MED ORDER — INSULIN GLARGINE-YFGN 100 UNIT/ML ~~LOC~~ SOPN
48.0000 [IU] | PEN_INJECTOR | SUBCUTANEOUS | 1 refills | Status: DC
Start: 2023-11-11 — End: 2023-11-13
  Filled 2023-11-11: qty 42, 87d supply, fill #0

## 2023-11-13 ENCOUNTER — Other Ambulatory Visit (HOSPITAL_COMMUNITY): Payer: Self-pay

## 2023-11-13 ENCOUNTER — Other Ambulatory Visit: Payer: Self-pay | Admitting: Family Medicine

## 2023-11-13 ENCOUNTER — Other Ambulatory Visit: Payer: Self-pay | Admitting: Family

## 2023-11-13 MED ORDER — LANTUS SOLOSTAR 100 UNIT/ML ~~LOC~~ SOPN
48.0000 [IU] | PEN_INJECTOR | Freq: Every day | SUBCUTANEOUS | 1 refills | Status: DC
  Filled 2023-11-13: qty 15, 31d supply, fill #0

## 2023-11-14 MED ORDER — INSULIN GLARGINE-YFGN 100 UNIT/ML ~~LOC~~ SOPN
48.0000 [IU] | PEN_INJECTOR | SUBCUTANEOUS | 1 refills | Status: DC
  Filled 2023-11-14: qty 42, 88d supply, fill #0
  Filled 2023-11-15: qty 42, 87d supply, fill #0
  Filled 2024-02-14: qty 42, 87d supply, fill #1
  Filled 2024-05-16 – 2024-06-24 (×4): qty 36, 75d supply, fill #2

## 2023-11-14 NOTE — Telephone Encounter (Signed)
 See patient note:  Pharmacy comment: Can we switch back to [facility]? There's a newly released coupon for the generic that will bring the copay down to zero. Thanks and sorry for the confusion.   Okay to switch?

## 2023-11-15 ENCOUNTER — Other Ambulatory Visit: Payer: Self-pay

## 2023-11-15 ENCOUNTER — Other Ambulatory Visit (HOSPITAL_COMMUNITY): Payer: Self-pay

## 2023-11-16 DIAGNOSIS — E119 Type 2 diabetes mellitus without complications: Secondary | ICD-10-CM | POA: Diagnosis not present

## 2023-11-16 DIAGNOSIS — Z90710 Acquired absence of both cervix and uterus: Secondary | ICD-10-CM | POA: Diagnosis not present

## 2023-11-16 DIAGNOSIS — E785 Hyperlipidemia, unspecified: Secondary | ICD-10-CM | POA: Diagnosis not present

## 2023-11-16 DIAGNOSIS — I10 Essential (primary) hypertension: Secondary | ICD-10-CM | POA: Diagnosis not present

## 2023-11-16 DIAGNOSIS — Z01419 Encounter for gynecological examination (general) (routine) without abnormal findings: Secondary | ICD-10-CM | POA: Diagnosis not present

## 2023-11-19 ENCOUNTER — Other Ambulatory Visit: Payer: Self-pay | Admitting: Family Medicine

## 2023-11-19 DIAGNOSIS — M79671 Pain in right foot: Secondary | ICD-10-CM

## 2023-11-19 DIAGNOSIS — M25559 Pain in unspecified hip: Secondary | ICD-10-CM

## 2023-11-20 ENCOUNTER — Other Ambulatory Visit (HOSPITAL_COMMUNITY): Payer: Self-pay

## 2023-11-20 MED ORDER — GABAPENTIN 300 MG PO CAPS
300.0000 mg | ORAL_CAPSULE | Freq: Three times a day (TID) | ORAL | 3 refills | Status: AC
  Filled 2023-11-20: qty 180, 30d supply, fill #0

## 2023-11-20 MED ORDER — TRAMADOL HCL 50 MG PO TABS
50.0000 mg | ORAL_TABLET | Freq: Three times a day (TID) | ORAL | 0 refills | Status: AC | PRN
  Filled 2023-11-20: qty 90, 30d supply, fill #0

## 2023-11-20 NOTE — Telephone Encounter (Signed)
 Requesting: tramadol 50mg   Contract:  01/19/23 UDS: 01/19/23 Last Visit: 09/28/23 Next Visit: 02/08/24 Last Refill: 02/21/23 #90 and 0RF   Please Advise

## 2023-12-04 ENCOUNTER — Other Ambulatory Visit (HOSPITAL_COMMUNITY): Payer: Self-pay

## 2023-12-07 ENCOUNTER — Encounter: Payer: Self-pay | Admitting: Podiatry

## 2023-12-07 ENCOUNTER — Ambulatory Visit (INDEPENDENT_AMBULATORY_CARE_PROVIDER_SITE_OTHER)

## 2023-12-07 ENCOUNTER — Ambulatory Visit: Payer: Commercial Managed Care - PPO | Admitting: Podiatry

## 2023-12-07 DIAGNOSIS — M7731 Calcaneal spur, right foot: Secondary | ICD-10-CM

## 2023-12-07 DIAGNOSIS — M79671 Pain in right foot: Secondary | ICD-10-CM

## 2023-12-07 DIAGNOSIS — M79672 Pain in left foot: Secondary | ICD-10-CM

## 2023-12-07 DIAGNOSIS — M2042 Other hammer toe(s) (acquired), left foot: Secondary | ICD-10-CM | POA: Diagnosis not present

## 2023-12-07 DIAGNOSIS — M21619 Bunion of unspecified foot: Secondary | ICD-10-CM

## 2023-12-07 DIAGNOSIS — M2041 Other hammer toe(s) (acquired), right foot: Secondary | ICD-10-CM | POA: Diagnosis not present

## 2023-12-07 DIAGNOSIS — G629 Polyneuropathy, unspecified: Secondary | ICD-10-CM | POA: Diagnosis not present

## 2023-12-07 DIAGNOSIS — M722 Plantar fascial fibromatosis: Secondary | ICD-10-CM | POA: Diagnosis not present

## 2023-12-07 NOTE — Progress Notes (Signed)
    Subjective:  Patient ID: Eileen Richards, female    DOB: May 14, 1961,  MRN: [id number]  Chief Complaint  Patient presents with   Foot Pain    RM#12 Bilateral foot pain for several months now. Has tried foot cream no relief.    Discussed the use of AI scribe software for clinical note transcription with the patient, who gave verbal consent to proceed.  History of Present Illness The patient, with a history of foot spurs and diabetes, presents with bilateral foot pain, described as a burning sensation. 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 The pain is severe enough to wake the patient at night and makes it difficult for her to walk. Despite being on gabapentin for nerve pain, the patient reports no significant relief. The patient's diabetes is well-controlled, with a recent A1c of 6.7.      Objective:    Physical Exam General: AAO x3, NAD  Dermatological: Skin is warm, dry and supple bilateral. There are no open sores, no preulcerative lesions, no rash or signs of infection present.  Vascular: Dorsalis Pedis artery and Posterior Tibial artery pedal pulses are 2/4 bilateral with immedate capillary fill time.  There is no pain with calf compression, swelling, warmth, erythema.   Neruologic: Grossly intact via light touch bilateral. Negative tinel sign.   Musculoskeletal: Tenderness palpation along the plantar medial tubercle of the calcaneus at the insertion of plantar fascia bilaterally.  There is discomfort in the arch of the foot on the right side.  Not able to appreciate any area pinpoint tenderness bilaterally.  Bunion, hammertoes present.  There is no edema no erythema.  Flexor, extensor tendons intact.  MMT 5/5.  Gait: Unassisted, Nonantalgic.     No images are attached to the encounter.    Results LABS A1c: 6.7% (09/28/2023)  RADIOLOGY Foot X-ray: Hallux valgus,  hammer toes, calcaneal spur on right foot, pes planus on right foot   Assessment:   1. Heel spur, right   2. Plantar fasciitis      Plan:  Patient was evaluated and treated and all questions answered.  Assessment and Plan Assessment & Plan Plantar fasciitis with heel spurs Chronic plantar fasciitis with heel spurs on the right foot causing significant pain. X-rays confirm spurs. Previous corticosteroid injections provided temporary relief. Emphasized daily icing and stretching. Recommended supportive shoes. Physical therapy or ultrasound may aid healing. - Consider corticosteroid injection for inflammation if desired.  Discussed a half today as they have not providing significant improvement. - Advise daily icing and stretching exercises. - Evaluate insurance coverage for custom orthotics. - Recommend shoes with good arch support and a thicker heel. - Consider physical therapy or shockwave ultrasound therapy  - Provide compression anklets to reduce swelling.  Compression anklet dispensed.  Peripheral neuropathy Burning sensation in both feet, more pronounced in the right, suggestive of peripheral neuropathy. Current gabapentin regimen insufficient. Discussed increasing nighttime dose to 600 mg with monitoring for side effects. - Increase gabapentin dose at night to 600 mg as a trial. - Monitor for drowsiness or other side effects with increased dose.  Bunion and hammer toes Bunion and hammer toes present, more pronounced on the right foot, contributing to discomfort.  -Continue shoes, offloading. - Monitor for any changes or worsening of symptoms.  No follow-ups on file.   Vivi Barrack DPM

## 2023-12-07 NOTE — Patient Instructions (Signed)
 Plantar Fasciitis (Heel Spur Syndrome) with Rehab The plantar fascia is a fibrous, ligament-like, soft-tissue structure that spans the bottom of the foot. Plantar fasciitis is a condition that causes pain in the foot due to inflammation of the tissue. SYMPTOMS  Pain and tenderness on the underneath side of the foot. Pain that worsens with standing or walking. CAUSES  Plantar fasciitis is caused by irritation and injury to the plantar fascia on the underneath side of the foot. Common mechanisms of injury include: Direct trauma to bottom of the foot. Damage to a small nerve that runs under the foot where the main fascia attaches to the heel bone. Stress placed on the plantar fascia due to bone spurs. RISK INCREASES WITH:  Activities that place stress on the plantar fascia (running, jumping, pivoting, or cutting). Poor strength and flexibility. Improperly fitted shoes. Tight calf muscles. Flat feet. Failure to warm-up properly before activity. Obesity. PREVENTION Warm up and stretch properly before activity. Allow for adequate recovery between workouts. Maintain physical fitness: Strength, flexibility, and endurance. Cardiovascular fitness. Maintain a health body weight. Avoid stress on the plantar fascia. Wear properly fitted shoes, including arch supports for individuals who have flat feet.  PROGNOSIS  If treated properly, then the symptoms of plantar fasciitis usually resolve without surgery. However, occasionally surgery is necessary.  RELATED COMPLICATIONS  Recurrent symptoms that may result in a chronic condition. Problems of the lower back that are caused by compensating for the injury, such as limping. Pain or weakness of the foot during push-off following surgery. Chronic inflammation, scarring, and partial or complete fascia tear, occurring more often from repeated injections.  TREATMENT  Treatment initially involves the use of ice and medication to help reduce pain and  inflammation. The use of strengthening and stretching exercises may help reduce pain with activity, especially stretches of the Achilles tendon. These exercises may be performed at home or with a therapist. Your caregiver may recommend that you use heel cups of arch supports to help reduce stress on the plantar fascia. Occasionally, corticosteroid injections are given to reduce inflammation. If symptoms persist for greater than 6 months despite non-surgical (conservative), then surgery may be recommended.   MEDICATION  If pain medication is necessary, then nonsteroidal anti-inflammatory medications, such as aspirin and ibuprofen, or other minor pain relievers, such as acetaminophen, are often recommended. Do not take pain medication within 7 days before surgery. Prescription pain relievers may be given if deemed necessary by your caregiver. Use only as directed and only as much as you need. Corticosteroid injections may be given by your caregiver. These injections should be reserved for the most serious cases, because they may only be given a certain number of times.  HEAT AND COLD Cold treatment (icing) relieves pain and reduces inflammation. Cold treatment should be applied for 10 to 15 minutes every 2 to 3 hours for inflammation and pain and immediately after any activity that aggravates your symptoms. Use ice packs or massage the area with a piece of ice (ice massage). Heat treatment may be used prior to performing the stretching and strengthening activities prescribed by your caregiver, physical therapist, or athletic trainer. Use a heat pack or soak the injury in warm water.  SEEK IMMEDIATE MEDICAL CARE IF: Treatment seems to offer no benefit, or the condition worsens. Any medications produce adverse side effects.  EXERCISES- RANGE OF MOTION (ROM) AND STRETCHING EXERCISES - Plantar Fasciitis (Heel Spur Syndrome) These exercises may help you when beginning to rehabilitate your injury. Your  symptoms may resolve with or without further involvement from your physician, physical therapist or athletic trainer. While completing these exercises, remember:  Restoring tissue flexibility helps normal motion to return to the joints. This allows healthier, less painful movement and activity. An effective stretch should be held for at least 30 seconds. A stretch should never be painful. You should only feel a gentle lengthening or release in the stretched tissue.  RANGE OF MOTION - Toe Extension, Flexion Sit with your right / left leg crossed over your opposite knee. Grasp your toes and gently pull them back toward the top of your foot. You should feel a stretch on the bottom of your toes and/or foot. Hold this stretch for 10 seconds. Now, gently pull your toes toward the bottom of your foot. You should feel a stretch on the top of your toes and or foot. Hold this stretch for 10 seconds. Repeat  times. Complete this stretch 3 times per day.   RANGE OF MOTION - Ankle Dorsiflexion, Active Assisted Remove shoes and sit on a chair that is preferably not on a carpeted surface. Place right / left foot under knee. Extend your opposite leg for support. Keeping your heel down, slide your right / left foot back toward the chair until you feel a stretch at your ankle or calf. If you do not feel a stretch, slide your bottom forward to the edge of the chair, while still keeping your heel down. Hold this stretch for 10 seconds. Repeat 3 times. Complete this stretch 2 times per day.   STRETCH  Gastroc, Standing Place hands on wall. Extend right / left leg, keeping the front knee somewhat bent. Slightly point your toes inward on your back foot. Keeping your right / left heel on the floor and your knee straight, shift your weight toward the wall, not allowing your back to arch. You should feel a gentle stretch in the right / left calf. Hold this position for 10 seconds. Repeat 3 times. Complete this  stretch 2 times per day.  STRETCH  Soleus, Standing Place hands on wall. Extend right / left leg, keeping the other knee somewhat bent. Slightly point your toes inward on your back foot. Keep your right / left heel on the floor, bend your back knee, and slightly shift your weight over the back leg so that you feel a gentle stretch deep in your back calf. Hold this position for 10 seconds. Repeat 3 times. Complete this stretch 2 times per day.  STRETCH  Gastrocsoleus, Standing  Note: This exercise can place a lot of stress on your foot and ankle. Please complete this exercise only if specifically instructed by your caregiver.  Place the ball of your right / left foot on a step, keeping your other foot firmly on the same step. Hold on to the wall or a rail for balance. Slowly lift your other foot, allowing your body weight to press your heel down over the edge of the step. You should feel a stretch in your right / left calf. Hold this position for 10 seconds. Repeat this exercise with a slight bend in your right / left knee. Repeat 3 times. Complete this stretch 2 times per day.   STRENGTHENING EXERCISES - Plantar Fasciitis (Heel Spur Syndrome)  These exercises may help you when beginning to rehabilitate your injury. They may resolve your symptoms with or without further involvement from your physician, physical therapist or athletic trainer. While completing these exercises, remember:  Muscles can  gain both the endurance and the strength needed for everyday activities through controlled exercises. Complete these exercises as instructed by your physician, physical therapist or athletic trainer. Progress the resistance and repetitions only as guided.  STRENGTH - Towel Curls Sit in a chair positioned on a non-carpeted surface. Place your foot on a towel, keeping your heel on the floor. Pull the towel toward your heel by only curling your toes. Keep your heel on the floor. Repeat 3 times.  Complete this exercise 2 times per day.  STRENGTH - Ankle Inversion Secure one end of a rubber exercise band/tubing to a fixed object (table, pole). Loop the other end around your foot just before your toes. Place your fists between your knees. This will focus your strengthening at your ankle. Slowly, pull your big toe up and in, making sure the band/tubing is positioned to resist the entire motion. Hold this position for 10 seconds. Have your muscles resist the band/tubing as it slowly pulls your foot back to the starting position. Repeat 3 times. Complete this exercises 2 times per day.  Document Released: 09/05/2005 Document Revised: 11/28/2011 Document Reviewed: 12/18/2008 ExitCare Patient Information 2014 Exit[employer], [place].   --  Gabapentin Capsules or Tablets What is this medication? GABAPENTIN (GA ba pen tin) treats nerve pain. It may also be used to prevent and control seizures in people with epilepsy. It works by calming overactive nerves in your body. This medicine may be used for other purposes; ask your health care provider or pharmacist if you have questions. COMMON BRAND NAME(S): Active-PAC with Gabapentin, Ascencion Dike, Gralise, Neurontin What should I tell my care team before I take this medication? They need to know if you have any of these conditions: Kidney disease Lung or breathing disease Substance use disorder Suicidal thoughts, plans, or attempt by you or a family member An unusual or allergic reaction to gabapentin, other medications, foods, dyes, or preservatives Pregnant or trying to get pregnant Breastfeeding How should I use this medication? Take this medication by mouth with a glass of water. Follow the directions on the prescription label. You can take it with or without food. If it upsets your stomach, take it with food. Take your medication at regular intervals. Do not take it more often than directed. Do not stop taking except on your care team's advice. If  you are directed to break the 600 or 800 mg tablets in half as part of your dose, the extra half tablet should be used for the next dose. If you have not used the extra half tablet within 28 days, it should be thrown away. A special MedGuide will be given to you by the pharmacist with each prescription and refill. Be sure to read this information carefully each time. Talk to your care team about the use of this medication in children. While this medication may be prescribed for children as young as 3 years for selected conditions, precautions do apply. Overdosage: If you think you have taken too much of this medicine contact a poison control center or emergency room at once. NOTE: This medicine is only for you. Do not share this medicine with others. What if I miss a dose? If you miss a dose, take it as soon as you can. If it is almost time for your next dose, take only that dose. Do not take double or extra doses. What may interact with this medication? Alcohol Antihistamines for allergy, cough, and cold Certain medications for anxiety or sleep Certain medications for  depression like amitriptyline, fluoxetine, sertraline Certain medications for seizures like phenobarbital, primidone Certain medications for stomach problems General anesthetics like halothane, isoflurane, methoxyflurane, propofol Local anesthetics like lidocaine, pramoxine, tetracaine Medications that relax muscles for surgery Opioid medications for pain Phenothiazines like chlorpromazine, mesoridazine, prochlorperazine, thioridazine This list may not describe all possible interactions. Give your health care provider a list of all the medicines, herbs, non-prescription drugs, or dietary supplements you use. Also tell them if you smoke, drink alcohol, or use illegal drugs. Some items may interact with your medicine. What should I watch for while using this medication? Visit your care team for regular checks on your progress. You  may want to keep a record at home of how you feel your condition is responding to treatment. You may want to share this information with your care team at each visit. You should contact your care team if your seizures get worse or if you have any new types of seizures. Do not stop taking this medication or any of your seizure medications unless instructed by your care team. Stopping your medication suddenly can increase your seizures or their severity. This medication may cause serious skin reactions. They can happen weeks to months after starting the medication. Contact your care team right away if you notice fevers or flu-like symptoms with a rash. The rash may be red or purple and then turn into blisters or peeling of the skin. Or, you might notice a red rash with swelling of the face, lips or lymph nodes in your neck or under your arms. Wear a medical identification bracelet or chain if you are taking this medication for seizures. Carry a card that lists all your medications. This medication may affect your coordination, reaction time, or judgment. Do not drive or operate machinery until you know how this medication affects you. Sit up or stand slowly to reduce the risk of dizzy or fainting spells. Drinking alcohol with this medication can increase the risk of these side effects. Your mouth may get dry. Chewing sugarless gum or sucking hard candy, and drinking plenty of water may help. Watch for new or worsening thoughts of suicide or depression. This includes sudden changes in mood, behaviors, or thoughts. These changes can happen at any time but are more common in the beginning of treatment or after a change in dose. Call your care team right away if you experience these thoughts or worsening depression. If you become pregnant while using this medication, you may enroll in the [place] American Antiepileptic Drug Pregnancy Registry by calling [phone]. This registry collects information about the  safety of antiepileptic medication use during pregnancy. What side effects may I notice from receiving this medication? Side effects that you should report to your care team as soon as possible: Allergic reactions or angioedema--skin rash, itching, hives, swelling of the face, eyes, lips, tongue, arms, or legs, trouble swallowing or breathing Rash, fever, and swollen lymph nodes Thoughts of suicide or self harm, worsening mood, feelings of depression Trouble breathing Unusual changes in mood or behavior in children after use such as difficulty concentrating, hostility, or restlessness Side effects that usually do not require medical attention (report to your care team if they continue or are bothersome): Dizziness Drowsiness Nausea Swelling of ankles, feet, or hands Vomiting This list may not describe all possible side effects. Call your doctor for medical advice about side effects. You may report side effects to FDA at 1-800-FDA-1088. Where should I keep my medication? Keep out of reach of  children and pets. Store at room temperature between 15 and 30 degrees C (59 and 86 degrees F). Get rid of any unused medication after the expiration date. This medication may cause accidental overdose and death if taken by other adults, children, or pets. To get rid of medications that are no longer needed or have expired: Take the medication to a medication take-back program. Check with your pharmacy or law enforcement to find a location. If you cannot return the medication, check the label or package insert to see if the medication should be thrown out in the garbage or flushed down the toilet. If you are not sure, ask your care team. If it is safe to put it in the trash, empty the medication out of the container. Mix the medication with cat litter, dirt, coffee grounds, or other unwanted substance. Seal the mixture in a bag or container. Put it in the trash. NOTE: This sheet is a summary. It may not  cover all possible information. If you have questions about this medicine, talk to your doctor, pharmacist, or health care provider.  2024 Elsevier/Gold Standard (2022-06-21 00:00:00)

## 2023-12-10 ENCOUNTER — Other Ambulatory Visit: Payer: Self-pay | Admitting: Family Medicine

## 2023-12-11 ENCOUNTER — Other Ambulatory Visit (HOSPITAL_COMMUNITY): Payer: Self-pay

## 2023-12-11 ENCOUNTER — Other Ambulatory Visit: Payer: Self-pay

## 2023-12-11 MED ORDER — FREESTYLE LIBRE 2 SENSOR MISC
3 refills | Status: DC
  Filled 2023-12-11: qty 2, 28d supply, fill #0
  Filled 2024-01-03: qty 2, 28d supply, fill #1
  Filled 2024-02-14: qty 2, 28d supply, fill #2

## 2023-12-12 ENCOUNTER — Other Ambulatory Visit (HOSPITAL_COMMUNITY): Payer: Self-pay

## 2023-12-14 ENCOUNTER — Other Ambulatory Visit: Payer: Commercial Managed Care - PPO

## 2023-12-27 ENCOUNTER — Other Ambulatory Visit (HOSPITAL_COMMUNITY): Payer: Self-pay

## 2024-01-03 ENCOUNTER — Other Ambulatory Visit: Payer: Self-pay

## 2024-01-03 ENCOUNTER — Other Ambulatory Visit: Payer: Self-pay | Admitting: Family Medicine

## 2024-01-03 ENCOUNTER — Other Ambulatory Visit (HOSPITAL_COMMUNITY): Payer: Self-pay

## 2024-01-03 MED ORDER — METFORMIN HCL ER 500 MG PO TB24
2000.0000 mg | ORAL_TABLET | Freq: Every day | ORAL | 0 refills | Status: DC
  Filled 2024-01-03 (×2): qty 360, 90d supply, fill #0

## 2024-01-11 ENCOUNTER — Ambulatory Visit: Admitting: Podiatry

## 2024-01-11 ENCOUNTER — Encounter: Payer: Self-pay | Admitting: Podiatry

## 2024-01-11 DIAGNOSIS — M722 Plantar fascial fibromatosis: Secondary | ICD-10-CM | POA: Diagnosis not present

## 2024-01-11 MED ORDER — TRIAMCINOLONE ACETONIDE 10 MG/ML IJ SUSP
5.0000 mg | Freq: Once | INTRAMUSCULAR | Status: AC
  Administered 2024-01-11: 5 mg via INTRAMUSCULAR

## 2024-01-11 NOTE — Patient Instructions (Signed)
 Plantar Fasciitis (Heel Spur Syndrome) with Rehab The plantar fascia is a fibrous, ligament-like, soft-tissue structure that spans the bottom of the foot. Plantar fasciitis is a condition that causes pain in the foot due to inflammation of the tissue. SYMPTOMS  Pain and tenderness on the underneath side of the foot. Pain that worsens with standing or walking. CAUSES  Plantar fasciitis is caused by irritation and injury to the plantar fascia on the underneath side of the foot. Common mechanisms of injury include: Direct trauma to bottom of the foot. Damage to a small nerve that runs under the foot where the main fascia attaches to the heel bone. Stress placed on the plantar fascia due to bone spurs. RISK INCREASES WITH:  Activities that place stress on the plantar fascia (running, jumping, pivoting, or cutting). Poor strength and flexibility. Improperly fitted shoes. Tight calf muscles. Flat feet. Failure to warm-up properly before activity. Obesity. PREVENTION Warm up and stretch properly before activity. Allow for adequate recovery between workouts. Maintain physical fitness: Strength, flexibility, and endurance. Cardiovascular fitness. Maintain a health body weight. Avoid stress on the plantar fascia. Wear properly fitted shoes, including arch supports for individuals who have flat feet.  PROGNOSIS  If treated properly, then the symptoms of plantar fasciitis usually resolve without surgery. However, occasionally surgery is necessary.  RELATED COMPLICATIONS  Recurrent symptoms that may result in a chronic condition. Problems of the lower back that are caused by compensating for the injury, such as limping. Pain or weakness of the foot during push-off following surgery. Chronic inflammation, scarring, and partial or complete fascia tear, occurring more often from repeated injections.  TREATMENT  Treatment initially involves the use of ice and medication to help reduce pain and  inflammation. The use of strengthening and stretching exercises may help reduce pain with activity, especially stretches of the Achilles tendon. These exercises may be performed at home or with a therapist. Your caregiver may recommend that you use heel cups of arch supports to help reduce stress on the plantar fascia. Occasionally, corticosteroid injections are given to reduce inflammation. If symptoms persist for greater than 6 months despite non-surgical (conservative), then surgery may be recommended.   MEDICATION  If pain medication is necessary, then nonsteroidal anti-inflammatory medications, such as aspirin and ibuprofen , or other minor pain relievers, such as acetaminophen , are often recommended. Do not take pain medication within 7 days before surgery. Prescription pain relievers may be given if deemed necessary by your caregiver. Use only as directed and only as much as you need. Corticosteroid injections may be given by your caregiver. These injections should be reserved for the most serious cases, because they may only be given a certain number of times.  HEAT AND COLD Cold treatment (icing) relieves pain and reduces inflammation. Cold treatment should be applied for 10 to 15 minutes every 2 to 3 hours for inflammation and pain and immediately after any activity that aggravates your symptoms. Use ice packs or massage the area with a piece of ice (ice massage). Heat treatment may be used prior to performing the stretching and strengthening activities prescribed by your caregiver, physical therapist, or athletic trainer. Use a heat pack or soak the injury in warm water.  SEEK IMMEDIATE MEDICAL CARE IF: Treatment seems to offer no benefit, or the condition worsens. Any medications produce adverse side effects.  EXERCISES- RANGE OF MOTION (ROM) AND STRETCHING EXERCISES - Plantar Fasciitis (Heel Spur Syndrome) These exercises may help you when beginning to rehabilitate your injury. Your  symptoms may resolve with or without further involvement from your physician, physical therapist or athletic trainer. While completing these exercises, remember:  Restoring tissue flexibility helps normal motion to return to the joints. This allows healthier, less painful movement and activity. An effective stretch should be held for at least 30 seconds. A stretch should never be painful. You should only feel a gentle lengthening or release in the stretched tissue.  RANGE OF MOTION - Toe Extension, Flexion Sit with your right / left leg crossed over your opposite knee. Grasp your toes and gently pull them back toward the top of your foot. You should feel a stretch on the bottom of your toes and/or foot. Hold this stretch for 10 seconds. Now, gently pull your toes toward the bottom of your foot. You should feel a stretch on the top of your toes and or foot. Hold this stretch for 10 seconds. Repeat  times. Complete this stretch 3 times per day.   RANGE OF MOTION - Ankle Dorsiflexion, Active Assisted Remove shoes and sit on a chair that is preferably not on a carpeted surface. Place right / left foot under knee. Extend your opposite leg for support. Keeping your heel down, slide your right / left foot back toward the chair until you feel a stretch at your ankle or calf. If you do not feel a stretch, slide your bottom forward to the edge of the chair, while still keeping your heel down. Hold this stretch for 10 seconds. Repeat 3 times. Complete this stretch 2 times per day.   STRETCH  Gastroc, Standing Place hands on wall. Extend right / left leg, keeping the front knee somewhat bent. Slightly point your toes inward on your back foot. Keeping your right / left heel on the floor and your knee straight, shift your weight toward the wall, not allowing your back to arch. You should feel a gentle stretch in the right / left calf. Hold this position for 10 seconds. Repeat 3 times. Complete this  stretch 2 times per day.  STRETCH  Soleus, Standing Place hands on wall. Extend right / left leg, keeping the other knee somewhat bent. Slightly point your toes inward on your back foot. Keep your right / left heel on the floor, bend your back knee, and slightly shift your weight over the back leg so that you feel a gentle stretch deep in your back calf. Hold this position for 10 seconds. Repeat 3 times. Complete this stretch 2 times per day.  STRETCH  Gastrocsoleus, Standing  Note: This exercise can place a lot of stress on your foot and ankle. Please complete this exercise only if specifically instructed by your caregiver.  Place the ball of your right / left foot on a step, keeping your other foot firmly on the same step. Hold on to the wall or a rail for balance. Slowly lift your other foot, allowing your body weight to press your heel down over the edge of the step. You should feel a stretch in your right / left calf. Hold this position for 10 seconds. Repeat this exercise with a slight bend in your right / left knee. Repeat 3 times. Complete this stretch 2 times per day.   STRENGTHENING EXERCISES - Plantar Fasciitis (Heel Spur Syndrome)  These exercises may help you when beginning to rehabilitate your injury. They may resolve your symptoms with or without further involvement from your physician, physical therapist or athletic trainer. While completing these exercises, remember:  Muscles can  gain both the endurance and the strength needed for everyday activities through controlled exercises. Complete these exercises as instructed by your physician, physical therapist or athletic trainer. Progress the resistance and repetitions only as guided.  STRENGTH - Towel Curls Sit in a chair positioned on a non-carpeted surface. Place your foot on a towel, keeping your heel on the floor. Pull the towel toward your heel by only curling your toes. Keep your heel on the floor. Repeat 3 times.  Complete this exercise 2 times per day.  STRENGTH - Ankle Inversion Secure one end of a rubber exercise band/tubing to a fixed object (table, pole). Loop the other end around your foot just before your toes. Place your fists between your knees. This will focus your strengthening at your ankle. Slowly, pull your big toe up and in, making sure the band/tubing is positioned to resist the entire motion. Hold this position for 10 seconds. Have your muscles resist the band/tubing as it slowly pulls your foot back to the starting position. Repeat 3 times. Complete this exercises 2 times per day.  Document Released: 09/05/2005 Document Revised: 11/28/2011 Document Reviewed: 12/18/2008 [employer] Patient Information 2014 [employer], [place].  --  Plantar Fasciitis (Heel Spur Syndrome) with Rehab The plantar fascia is a fibrous, ligament-like, soft-tissue structure that spans the bottom of the foot. Plantar fasciitis is a condition that causes pain in the foot due to inflammation of the tissue. SYMPTOMS  Pain and tenderness on the underneath side of the foot. Pain that worsens with standing or walking. CAUSES  Plantar fasciitis is caused by irritation and injury to the plantar fascia on the underneath side of the foot. Common mechanisms of injury include: Direct trauma to bottom of the foot. Damage to a small nerve that runs under the foot where the main fascia attaches to the heel bone. Stress placed on the plantar fascia due to bone spurs. RISK INCREASES WITH:  Activities that place stress on the plantar fascia (running, jumping, pivoting, or cutting). Poor strength and flexibility. Improperly fitted shoes. Tight calf muscles. Flat feet. Failure to warm-up properly before activity. Obesity. PREVENTION Warm up and stretch properly before activity. Allow for adequate recovery between workouts. Maintain physical fitness: Strength, flexibility, and endurance. Cardiovascular fitness. Maintain a  health body weight. Avoid stress on the plantar fascia. Wear properly fitted shoes, including arch supports for individuals who have flat feet.  PROGNOSIS  If treated properly, then the symptoms of plantar fasciitis usually resolve without surgery. However, occasionally surgery is necessary.  RELATED COMPLICATIONS  Recurrent symptoms that may result in a chronic condition. Problems of the lower back that are caused by compensating for the injury, such as limping. Pain or weakness of the foot during push-off following surgery. Chronic inflammation, scarring, and partial or complete fascia tear, occurring more often from repeated injections.  TREATMENT  Treatment initially involves the use of ice and medication to help reduce pain and inflammation. The use of strengthening and stretching exercises may help reduce pain with activity, especially stretches of the Achilles tendon. These exercises may be performed at home or with a therapist. Your caregiver may recommend that you use heel cups of arch supports to help reduce stress on the plantar fascia. Occasionally, corticosteroid injections are given to reduce inflammation. If symptoms persist for greater than 6 months despite non-surgical (conservative), then surgery may be recommended.   MEDICATION  If pain medication is necessary, then nonsteroidal anti-inflammatory medications, such as aspirin and ibuprofen , or other minor pain relievers, such  as acetaminophen , are often recommended. Do not take pain medication within 7 days before surgery. Prescription pain relievers may be given if deemed necessary by your caregiver. Use only as directed and only as much as you need. Corticosteroid injections may be given by your caregiver. These injections should be reserved for the most serious cases, because they may only be given a certain number of times.  HEAT AND COLD Cold treatment (icing) relieves pain and reduces inflammation. Cold treatment should  be applied for 10 to 15 minutes every 2 to 3 hours for inflammation and pain and immediately after any activity that aggravates your symptoms. Use ice packs or massage the area with a piece of ice (ice massage). Heat treatment may be used prior to performing the stretching and strengthening activities prescribed by your caregiver, physical therapist, or athletic trainer. Use a heat pack or soak the injury in warm water.  SEEK IMMEDIATE MEDICAL CARE IF: Treatment seems to offer no benefit, or the condition worsens. Any medications produce adverse side effects.  EXERCISES- RANGE OF MOTION (ROM) AND STRETCHING EXERCISES - Plantar Fasciitis (Heel Spur Syndrome) These exercises may help you when beginning to rehabilitate your injury. Your symptoms may resolve with or without further involvement from your physician, physical therapist or athletic trainer. While completing these exercises, remember:  Restoring tissue flexibility helps normal motion to return to the joints. This allows healthier, less painful movement and activity. An effective stretch should be held for at least 30 seconds. A stretch should never be painful. You should only feel a gentle lengthening or release in the stretched tissue.  RANGE OF MOTION - Toe Extension, Flexion Sit with your right / left leg crossed over your opposite knee. Grasp your toes and gently pull them back toward the top of your foot. You should feel a stretch on the bottom of your toes and/or foot. Hold this stretch for 10 seconds. Now, gently pull your toes toward the bottom of your foot. You should feel a stretch on the top of your toes and or foot. Hold this stretch for 10 seconds. Repeat  times. Complete this stretch 3 times per day.   RANGE OF MOTION - Ankle Dorsiflexion, Active Assisted Remove shoes and sit on a chair that is preferably not on a carpeted surface. Place right / left foot under knee. Extend your opposite leg for support. Keeping your  heel down, slide your right / left foot back toward the chair until you feel a stretch at your ankle or calf. If you do not feel a stretch, slide your bottom forward to the edge of the chair, while still keeping your heel down. Hold this stretch for 10 seconds. Repeat 3 times. Complete this stretch 2 times per day.   STRETCH  Gastroc, Standing Place hands on wall. Extend right / left leg, keeping the front knee somewhat bent. Slightly point your toes inward on your back foot. Keeping your right / left heel on the floor and your knee straight, shift your weight toward the wall, not allowing your back to arch. You should feel a gentle stretch in the right / left calf. Hold this position for 10 seconds. Repeat 3 times. Complete this stretch 2 times per day.  STRETCH  Soleus, Standing Place hands on wall. Extend right / left leg, keeping the other knee somewhat bent. Slightly point your toes inward on your back foot. Keep your right / left heel on the floor, bend your back knee, and slightly shift your  weight over the back leg so that you feel a gentle stretch deep in your back calf. Hold this position for 10 seconds. Repeat 3 times. Complete this stretch 2 times per day.  STRETCH  Gastrocsoleus, Standing  Note: This exercise can place a lot of stress on your foot and ankle. Please complete this exercise only if specifically instructed by your caregiver.  Place the ball of your right / left foot on a step, keeping your other foot firmly on the same step. Hold on to the wall or a rail for balance. Slowly lift your other foot, allowing your body weight to press your heel down over the edge of the step. You should feel a stretch in your right / left calf. Hold this position for 10 seconds. Repeat this exercise with a slight bend in your right / left knee. Repeat 3 times. Complete this stretch 2 times per day.   STRENGTHENING EXERCISES - Plantar Fasciitis (Heel Spur Syndrome)  These exercises  may help you when beginning to rehabilitate your injury. They may resolve your symptoms with or without further involvement from your physician, physical therapist or athletic trainer. While completing these exercises, remember:  Muscles can gain both the endurance and the strength needed for everyday activities through controlled exercises. Complete these exercises as instructed by your physician, physical therapist or athletic trainer. Progress the resistance and repetitions only as guided.  STRENGTH - Towel Curls Sit in a chair positioned on a non-carpeted surface. Place your foot on a towel, keeping your heel on the floor. Pull the towel toward your heel by only curling your toes. Keep your heel on the floor. Repeat 3 times. Complete this exercise 2 times per day.  STRENGTH - Ankle Inversion Secure one end of a rubber exercise band/tubing to a fixed object (table, pole). Loop the other end around your foot just before your toes. Place your fists between your knees. This will focus your strengthening at your ankle. Slowly, pull your big toe up and in, making sure the band/tubing is positioned to resist the entire motion. Hold this position for 10 seconds. Have your muscles resist the band/tubing as it slowly pulls your foot back to the starting position. Repeat 3 times. Complete this exercises 2 times per day.  Document Released: 09/05/2005 Document Revised: 11/28/2011 Document Reviewed: 12/18/2008 [employer] Patient Information 2014 [employer], [place].  --  WEARING INSTRUCTIONS FOR ORTHOTICS  Don't expect to be comfortable wearing your orthotic devices for the first time.  Like eyeglasses, you may be aware of them as time passes, they will not be uncomfortable and you will enjoy wearing them.  FOLLOW THESE INSTRUCTIONS EXACTLY!  Wear your orthotic devices for:       Not more than 1 hour the first day.       Not more than 2 hours the second day.       Not more than 3 hours the third day  and so on.        Or wear them for as long as they feel comfortable.       If you experience discomfort in your feet or legs take them out.  When feet & legs feel       better, put them back in.  You do need to be consistent and wear them a little        everyday. 2.   If at any time the orthotic devices become acutely uncomfortable before the       time  for that particular day, STOP WEARING THEM. 3.   On the next day, do not increase the wearing time. 4.   Subsequently, increase the wearing time by 15-30 minutes only if comfortable to do       so. 5.   You will be seen by your doctor about 2-4 weeks after you receive your orthotic       devices, at which time you will probably be wearing your devices comfortably        for about 8 hours or more a day. 6.   Some patients occasionally report mild aches or discomfort in other parts of the of       body such as the knees, hips or back after 3 or 4 consecutive hours of wear.  If this       is the case with you, do not extend your wearing time.  Instead, cut it back an hour or       two.  In all likelihood, these symptoms will disappear in a short period of time as your       body posture realigns itself and functions more efficiently. 7.   It is possible that your orthotic device may require some small changes or adjustment       to improve their function or make them more comfortable.   This is usually not done       before one to three months have elapsed.  These adjustments are made in        accordance with the changed position your feet are assuming as a result of       improved biomechanical function. 8.   In women's shoes, it's not unusual for your heel to slip out of the shoe, particularly if       they are step-in-shoes.  If this is the case, try other shoes or other styles.  Try to       purchase shoes which have deeper heal seats or higher heel counters. 9.   Squeaking of orthotics devices in the shoes is due to the movement of the  devices       when they are functioning normally.  To eliminate squeaking, simply dust some       baby powder into your shoes before inserting the devices.  If this does not work,        apply soap or wax to the edges of the orthotic devices or put a tissue into the shoes. 10. It is important that you follow these directions explicitly.  Failure to do so will simply       prolong the adjustment period or create problems which are easily avoided.  It makes       no difference if you are wearing your orthotic devices for only a few hours after        several months, so long as you are wearing them comfortably for those hours. 11. If you have any questions or complaints, contact our office.  We have no way of       knowing about your problems unless you tell us .  If we do not hear from you, we will       assume that you are proceeding well.

## 2024-01-15 ENCOUNTER — Telehealth: Payer: Self-pay | Admitting: Family Medicine

## 2024-01-15 NOTE — Telephone Encounter (Signed)
 Copied from CRM [id number]. Topic: General - Other >> Jan 15, 2024 12:04 PM Allyne Areola wrote: Reason for CRM: Patient is calling to verify if FMLA paper work has been received from [facility], She would also like to know the status on when it's be sent back.

## 2024-01-16 NOTE — Telephone Encounter (Signed)
 Patient was advised that FMLA was faxed today,01/16/2024.

## 2024-01-17 NOTE — Progress Notes (Signed)
    Subjective:  Patient ID: Eileen Richards, female    DOB: 1961/07/29,  MRN: [id number]  Chief Complaint  Patient presents with   Plantar Fasciitis    RM#[id number] Follow up on plantar fasciitis no improvement patient still experiencing pain.    Discussed the use of AI scribe software for clinical note transcription with the patient, who gave verbal consent to proceed.  History of Present Illness The patient, with a history of diabetes, presents with bilateral foot pain, described as a burning sensation and pain to the heel.  She does not report any recent injuries but she denies any recent improvement since I saw her last.    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  Despite being on gabapentin  for nerve pain, the patient reports no significant relief. The patient's diabetes is well-controlled, with a recent A1c of 6.7.      Objective:    Physical Exam General: AAO x3, NAD  Dermatological: Skin is warm, dry and supple bilateral. There are no open sores, no preulcerative lesions, no rash or signs of infection present.  Vascular: Dorsalis Pedis artery and Posterior Tibial artery pedal pulses are 2/4 bilateral with immedate capillary fill time.  There is no pain with calf compression, swelling, warmth, erythema.   Neruologic: Grossly intact via light touch bilateral. Negative tinel sign noted again today.  Musculoskeletal: There is still tenderness to palpation along the plantar medial tubercle of the calcaneus at the insertion of plantar fascia bilaterally.  There is discomfort in the arch of the foot on the right side.  Not able to appreciate any area pinpoint tenderness bilaterally.  Bunion, hammertoes present.  There is no edema no erythema.  Flexor, extensor tendons intact.  MMT 5/5.    Results LABS A1c: 6.7% (09/28/2023)    Assessment:   1. Heel spur,  right   2. Plantar fasciitis      Plan:  Patient was evaluated and treated and all questions answered.  Assessment and Plan Assessment & Plan Plantar fasciitis with heel spurs - Corticosteroid injection administered today.  See procedure note below.  - Advise daily icing and stretching exercises. - Discussed custom orthotics. - Recommend shoes with good arch support and a thicker heel. - Consider physical therapy or shockwave therapy  - If symptoms persist recommend advanced imaging.  Procedure: Injection Tendon/Ligament Discussed alternatives, risks, complications and verbal consent was obtained.  Location: Right plantar fascia at the glabrous junction; medial approach. Skin Prep: Alcohol. Injectate: 0.5cc 0.5% marcaine  plain, 0.5 cc 2% lidocaine  plain and, 1 cc kenalog  10. Disposition: Patient tolerated procedure well. Injection site dressed with a band-aid.  Post-injection care was discussed and return precautions discussed.    Peripheral neuropathy - Current gabapentin  regimen insufficient. Discussed increasing nighttime dose to 600 mg with monitoring for side effects. - Increase gabapentin  dose at night to 600 mg as a trial. - Monitor for drowsiness or other side effects with increased dose.   No follow-ups on file.   Charity Conch DPM

## 2024-01-26 ENCOUNTER — Other Ambulatory Visit: Payer: Self-pay | Admitting: Family Medicine

## 2024-01-26 ENCOUNTER — Other Ambulatory Visit (HOSPITAL_COMMUNITY): Payer: Self-pay

## 2024-01-26 ENCOUNTER — Other Ambulatory Visit: Payer: Self-pay

## 2024-01-26 MED ORDER — POTASSIUM CHLORIDE CRYS ER 20 MEQ PO TBCR
20.0000 meq | EXTENDED_RELEASE_TABLET | Freq: Every day | ORAL | 0 refills | Status: DC
  Filled 2024-01-26: qty 90, 90d supply, fill #0

## 2024-02-08 ENCOUNTER — Ambulatory Visit: Admitting: Student

## 2024-02-08 ENCOUNTER — Encounter: Payer: Self-pay | Admitting: Student

## 2024-02-08 ENCOUNTER — Other Ambulatory Visit (HOSPITAL_COMMUNITY): Payer: Self-pay

## 2024-02-08 ENCOUNTER — Ambulatory Visit: Payer: Commercial Managed Care - PPO | Admitting: Family Medicine

## 2024-02-08 ENCOUNTER — Encounter: Payer: Self-pay | Admitting: *Deleted

## 2024-02-08 VITALS — BP 144/90 | HR 84 | Ht 64.0 in | Wt 222.0 lb

## 2024-02-08 DIAGNOSIS — I10 Essential (primary) hypertension: Secondary | ICD-10-CM | POA: Diagnosis not present

## 2024-02-08 DIAGNOSIS — E669 Obesity, unspecified: Secondary | ICD-10-CM | POA: Diagnosis not present

## 2024-02-08 DIAGNOSIS — E1169 Type 2 diabetes mellitus with other specified complication: Secondary | ICD-10-CM

## 2024-02-08 DIAGNOSIS — R7989 Other specified abnormal findings of blood chemistry: Secondary | ICD-10-CM | POA: Diagnosis not present

## 2024-02-08 DIAGNOSIS — E782 Mixed hyperlipidemia: Secondary | ICD-10-CM | POA: Diagnosis not present

## 2024-02-08 MED ORDER — ROSUVASTATIN CALCIUM 40 MG PO TABS
40.0000 mg | ORAL_TABLET | Freq: Every day | ORAL | 1 refills | Status: DC
  Filled 2024-02-08 – 2024-04-08 (×2): qty 90, 90d supply, fill #0
  Filled 2024-05-16 – 2024-07-09 (×2): qty 90, 90d supply, fill #1

## 2024-02-08 NOTE — Assessment & Plan Note (Signed)
 Lab Results  Component Value Date   PLT 535.0 (H) 10/16/2023

## 2024-02-08 NOTE — Assessment & Plan Note (Signed)
 Recheck A1c. Encourge Pt to minimize simple carbs. Increase exercise as tolerated. Continue current meds.

## 2024-02-08 NOTE — Assessment & Plan Note (Addendum)
 On 40 mg Crestor  qd. Tolerating Well. Encouraged. Encourage heart healthy diet such as DASH diet, increase exercise, avoid trans fats, simple carbohydrates and processed foods, consider a krill or fish or flaxseed oil cap daily.

## 2024-02-08 NOTE — Addendum Note (Signed)
 Addended by: Jackqueline Mason on: 02/08/2024 03:01 PM   Modules accepted: Level of Service

## 2024-02-08 NOTE — Progress Notes (Addendum)
 Subjective:     Patient ID: Eileen Richards, female    DOB: 1961-04-17, 63 y.o.   MRN: [id number]  Chief Complaint  Patient presents with   Medical Management of Chronic Issues    HPI    History of Present Illness      Eileen Richards a 63 y.o. female presents for follow-up on chronic conditions.  HYPERTENSION- followed by Cardiology Chest pain- denies    Dyspnea- denies Medications: Carvedilol  3.125 mg twice daily, HCTZ 25 mg daily Compliance- yes Lightheadedness- Denies  Edema- denies  DIABETES-  Medications: Metformin  2000mg  (500mg  tab x4 daily), insulin  glargine, and Mounjaro  12.5 MG  Compliance- yes compliant with medications Hypoglycemic symptoms- Denies GI Symptoms: Denies  Lab Results  Component Value Date   HGBA1C 6.7 (H) 09/28/2023    Wt Readings from Last 3 Encounters:  02/08/24 222 lb (100.7 kg)  09/28/23 230 lb 12.8 oz (104.7 kg)  06/13/23 235 lb (106.6 kg)     Lab Results  Component Value Date   HGBA1C 6.7 (H) 09/28/2023     HYPERLIPIDEMIA- Medications: Rosuvastatin  (Crestor ) 40 mg Compliance- yes  RUQ pain- denies Muscle aches- denies Denies adverse SEs  ASTHMA Medications: Flovent  110MG /ACT inh and Albuterol . Breathing well controlled. Using: rare use, >3 times per months.  Patient denies fever, chills, SOB, CP, palpitations, dyspnea, edema, HA, vision changes, N/V/D, abdominal pain, urinary symptoms, rash, weight changes, and recent illness or hospitalizations.      Health Maintenance Due  Topic Date Due   FOOT EXAM  06/09/2022    Past Medical History:  Diagnosis Date   Achilles tendonitis 10/14/2012   Asthma    childhood   Atypical chest pain    normal stress echo 02/2009 -LV size was normal LV global systolic function was normal- Normal wall motion; no LV regional wall motion abnormalities   Benign paroxysmal positional vertigo 04/26/2015   Chest pain, atypical 08/24/2013   Diabetes mellitus    GERD (gastroesophageal reflux  disease)    Hand pain, left 11/10/2015   Hyperlipemia    Hypertension    Neuropathic pain of ankle, left 04/27/2017   Onychomycosis 04/17/2014   Pain of right heel 10/14/2012   Preventative health care 11/22/2015   Sinusitis, acute 01/17/2016   Thoracic outlet syndrome 11/03/2014   Urinary urgency 12/15/2015    Past Surgical History:  Procedure Laterality Date   ABDOMINAL HYSTERECTOMY     CYSTOSCOPY  07/23/2012   Procedure: CYSTOSCOPY;  Surgeon: Kandra Orn, MD;  Location: [facility];  Service: Gynecology;  Laterality: N/A;   DILATION AND CURETTAGE OF UTERUS     Lysis of Adhesions   ROBOTIC ASSISTED LAPAROSCOPIC LYSIS OF ADHESION  07/23/2012   Procedure: ROBOTIC ASSISTED LAPAROSCOPIC LYSIS OF ADHESION;  Surgeon: Kandra Orn, MD;  Location: [facility];  Service: Gynecology;  Laterality: N/A;   ROBOTIC ASSISTED SALPINGO OOPHERECTOMY  07/23/2012   Procedure: ROBOTIC ASSISTED SALPINGO OOPHERECTOMY;  Surgeon: Kandra Orn, MD;  Location: [facility];  Service: Gynecology;  Laterality: Bilateral;    Family History  Problem Relation Age of Onset   GER disease Mother    GER disease Father    Heart disease Father        father has "heart problems"   Anemia Father    Dementia Maternal Aunt    Miscarriages / Stillbirths Maternal Uncle    Depression Paternal Grandmother    Asthma Other    Hypertension Other    Diabetes Other  Social History   Socioeconomic History   Marital status: Single    Spouse name: Not on file   Number of children: Not on file   Years of education: Not on file   Highest education level: Not on file  Occupational History   Not on file  Tobacco Use   Smoking status: Former    Current packs/day: 0.00    Types: Cigarettes    Quit date: 02/17/2014    Years since quitting: 9.9   Smokeless tobacco: Never  Vaping Use   Vaping status: Never Used  Substance and Sexual Activity   Alcohol use: No   Drug use: No   Sexual activity: Not on file  Other  Topics Concern   Not on file  Social History Narrative   Not on file   Social Drivers of Health   Financial Resource Strain: Not on file  Food Insecurity: Not on file  Transportation Needs: Not on file  Physical Activity: Not on file  Stress: Not on file  Social Connections: Not on file  Intimate Partner Violence: Not on file    Outpatient Medications Prior to Visit  Medication Sig Dispense Refill   albuterol  (VENTOLIN  HFA) 108 (90 Base) MCG/ACT inhaler Inhale 2 puffs into the lungs every 6 (six) hours as needed for wheezing or shortness of breath. 6.7 g 5   aspirin 81 MG tablet Take 81 mg by mouth daily.     Blood Pressure Monitoring (OMRON 3 SERIES BP MONITOR) DEVI Use as directed 1 each 0   carvedilol  (COREG ) 3.125 MG tablet Take 1 tablet (3.125 mg total) by mouth 2 (two) times daily. 180 tablet 1   cetirizine  (ZYRTEC ) 10 MG tablet TAKE 1 TABLET BY MOUTH 2 TIMES DAILY. (Patient taking differently: Take 10 mg by mouth 2 (two) times daily.) 200 tablet 1   Continuous Blood Gluc Receiver (FREESTYLE LIBRE 2 READER) DEVI USE AS DIRECTED. (Patient taking differently: 1 each by Other route See admin instructions. Glucose reading) 1 each 1   Continuous Glucose Sensor (FREESTYLE LIBRE 2 SENSOR) MISC Check blood sugars as directed 2 each 3   cycloSPORINE  (RESTASIS ) 0.05 % ophthalmic emulsion Instill 1 drop into both eyes twice a day 180 each 3   diclofenac  Sodium (VOLTAREN ) 1 % GEL Apply 2 g topically 4 (four) times daily. Rub into affected area of foot 2 to 4 times daily (Patient taking differently: Apply 2 g topically as needed (Joint pain). Rub into affected area of foot 2 to 4 times daily) 100 g 2   famotidine  (PEPCID ) 20 MG tablet Take 1 tablet (20 mg total) by mouth 2 (two) times daily. 180 tablet 1   fluticasone  (FLOVENT  HFA) 110 MCG/ACT inhaler Inhale 1 puff into the lungs 2 (two) times daily. 12 g 5   gabapentin  (NEURONTIN ) 300 MG capsule Take 1-2 capsules (300-600 mg total) by mouth 3  (three) times daily. 180 capsule 3   hydrochlorothiazide  (HYDRODIURIL ) 25 MG tablet Take 1 tablet (25 mg total) by mouth daily. 90 tablet 1   insulin  glargine-yfgn (SEMGLEE , YFGN,) 100 UNIT/ML Pen Inject 48 Units into the skin every morning. 60 mL 1   Insulin  Pen Needle (TECHLITE PEN NEEDLES) 32G X 4 MM MISC Use once daily 100 each 3   Iron -FA-B Cmp-C-Biot-Probiotic (FUSION PLUS) CAPS Take 1 capsule by mouth daily. 30 capsule 11   meloxicam  (MOBIC ) 7.5 MG tablet Take 1-2 tablets (7.5-15 mg total) by mouth daily. 60 tablet 2   metFORMIN  (GLUCOPHAGE -XR) 500 MG  24 hr tablet Take 4 tablets (2,000 mg total) by mouth daily. 360 tablet 0   nitroGLYCERIN  (NITROSTAT ) 0.4 MG SL tablet Place 1 tablet (0.4 mg total) under the tongue every 5 (five) minutes x 3 doses as needed for chest pain. 40 tablet 1   pantoprazole  (PROTONIX ) 40 MG tablet Take 1 tablet (40 mg total) by mouth daily. 90 tablet 1   potassium chloride  SA (KLOR-CON  M) 20 MEQ tablet Take 1 tablet (20 mEq total) by mouth daily. 90 tablet 0   promethazine  (PHENERGAN ) 25 MG tablet Take 1 tablet (25 mg total) by mouth every 8 (eight) hours as needed for nausea or vomiting. 20 tablet 0   tirzepatide  (MOUNJARO ) 12.5 MG/0.5ML Pen Inject 12.5 mg into the skin once a week. 6 mL 2   tiZANidine  (ZANAFLEX ) 2 MG tablet Take 1/2 - 2 tablets (1-4 mg total) by mouth 2 (two) times daily as needed for muscle spasms. 30 tablet 1   traMADol  (ULTRAM ) 50 MG tablet Take 1 tablet (50 mg total) by mouth 3 (three) times daily as needed. 90 tablet 0   rosuvastatin  (CRESTOR ) 40 MG tablet Take 1 tablet (40 mg total) by mouth daily at bedtime 90 tablet 1   rosuvastatin  (CRESTOR ) 20 MG tablet Take 1 tablet (20 mg total) by mouth daily. Patient needs an appointment for further refills. 3 rd/final attempt 15 tablet 0   No facility-administered medications prior to visit.    Allergies  Allergen Reactions   Farxiga  [Dapagliflozin ] Hives    ROS See HPI    Objective:      Physical Exam  General: No acute distress. Awake and conversant.  Eyes: Normal conjunctiva, anicteric. Round symmetric pupils.  Respiratory: WNL, clear. Respirations are non-labored. No wheezing.  Skin: Warm. No rashes or ulcers.  Psych: Alert and oriented. Cooperative, Appropriate mood and affect, Normal judgment.  CV: RRR, No lower extremity edema.  MSK: Normal ambulation. No clubbing or cyanosis.      BP (!) 144/90   Pulse 84   Ht 5\' 4"  (1.626 m)   Wt 222 lb (100.7 kg)   SpO2 93%   BMI 38.11 kg/m  Wt Readings from Last 3 Encounters:  02/08/24 222 lb (100.7 kg)  09/28/23 230 lb 12.8 oz (104.7 kg)  06/13/23 235 lb (106.6 kg)       Assessment & Plan:   Problem List Items Addressed This Visit     Diabetes mellitus type 2 in obese - Primary   Recheck A1c. Encourge Pt to minimize simple carbs. Increase exercise as tolerated. Continue current meds.      Relevant Medications   rosuvastatin  (CRESTOR ) 40 MG tablet   Other Relevant Orders   Hemoglobin A1c   Elevated platelet count   Lab Results  Component Value Date   PLT 535.0 (H) 10/16/2023        Relevant Orders   CBC with Differential/Platelet   Essential hypertension    BP today above goal at 144/90. Patient was encouraged to monitor blood pressure at home. Discussed blood pressure goals and the associated risks of uncontrolled hypertension. Will consider medication adjustment at upcoming comprehensive physical exam if blood pressure remains elevated. Encouraged heart healthy diet such as the DASH diet, limit salt intake, and exercise as tolerated.   BP Readings from Last 3 Encounters:  02/08/24 (!) 144/90  09/28/23 132/78  06/13/23 136/75         Relevant Medications   rosuvastatin  (CRESTOR ) 40 MG tablet   Hyperlipidemia, mixed  On 40 mg Crestor  qd. Tolerating Well. Encouraged. Encourage heart healthy diet such as DASH diet, increase exercise, avoid trans fats, simple carbohydrates and processed foods,  consider a krill or fish or flaxseed oil cap daily.         Relevant Medications   rosuvastatin  (CRESTOR ) 40 MG tablet   Other Relevant Orders   Lipid panel   Asthma Breathing well controlled with ICS inhaler, using >3 times per month. Albuterol  prn.   I am having Dannial Duty maintain her aspirin, promethazine , nitroGLYCERIN , cetirizine , diclofenac  Sodium, FreeStyle Libre 2 Reader, tiZANidine , albuterol , cycloSPORINE , Fusion Plus, TechLite Pen Needles, Omron 3 Series BP Monitor, meloxicam , tirzepatide , fluticasone , hydrochlorothiazide , famotidine , pantoprazole , carvedilol , insulin  glargine-yfgn, gabapentin , traMADol , FreeStyle Libre 2 Sensor, metFORMIN , potassium chloride  SA, and rosuvastatin .  Meds ordered this encounter  Medications   rosuvastatin  (CRESTOR ) 40 MG tablet    Sig: Take 1 tablet (40 mg total) by mouth daily at bedtime    Dispense:  90 tablet    Refill:  1    Supervising Provider:   Randie Bustle A [[id number]]

## 2024-02-08 NOTE — Assessment & Plan Note (Addendum)
 BP today above goal at 144/90. Patient was encouraged to monitor blood pressure at home. Discussed blood pressure goals and the associated risks of uncontrolled hypertension. Will consider medication adjustment at upcoming comprehensive physical exam if blood pressure remains elevated. Encouraged heart healthy diet such as the DASH diet, limit salt intake, and exercise as tolerated.   BP Readings from Last 3 Encounters:  02/08/24 (!) 144/90  09/28/23 132/78  06/13/23 136/75

## 2024-02-09 ENCOUNTER — Ambulatory Visit: Payer: Self-pay | Admitting: Student

## 2024-02-09 LAB — CBC WITH DIFFERENTIAL/PLATELET
Basophils Absolute: 0.1 10*3/uL (ref 0.0–0.1)
Basophils Relative: 1 % (ref 0.0–3.0)
Eosinophils Absolute: 0.2 10*3/uL (ref 0.0–0.7)
Eosinophils Relative: 2.2 % (ref 0.0–5.0)
HCT: 40.9 % (ref 36.0–46.0)
Hemoglobin: 13.3 g/dL (ref 12.0–15.0)
Lymphocytes Relative: 22.6 % (ref 12.0–46.0)
Lymphs Abs: 2.4 10*3/uL (ref 0.7–4.0)
MCHC: 32.6 g/dL (ref 30.0–36.0)
MCV: 89.1 fl (ref 78.0–100.0)
Monocytes Absolute: 0.6 10*3/uL (ref 0.1–1.0)
Monocytes Relative: 6.1 % (ref 3.0–12.0)
Neutro Abs: 7.2 10*3/uL (ref 1.4–7.7)
Neutrophils Relative %: 68.1 % (ref 43.0–77.0)
Platelets: 499 10*3/uL — ABNORMAL HIGH (ref 150.0–400.0)
RBC: 4.59 Mil/uL (ref 3.87–5.11)
RDW: 14.8 % (ref 11.5–15.5)
WBC: 10.6 10*3/uL — ABNORMAL HIGH (ref 4.0–10.5)

## 2024-02-09 LAB — LIPID PANEL
Cholesterol: 139 mg/dL (ref 0–200)
HDL: 41 mg/dL (ref >39.00)
LDL Cholesterol: 79 mg/dL (ref 0–99)
NonHDL: 98.49
Total CHOL/HDL Ratio: 3
Triglycerides: 96 mg/dL (ref 0.0–149.0)
VLDL: 19.2 mg/dL (ref 0.0–40.0)

## 2024-02-09 LAB — HEMOGLOBIN A1C: Hgb A1c MFr Bld: 6.6 % — ABNORMAL HIGH (ref 4.6–6.5)

## 2024-02-14 ENCOUNTER — Other Ambulatory Visit (HOSPITAL_COMMUNITY): Payer: Self-pay

## 2024-03-01 ENCOUNTER — Encounter: Payer: Self-pay | Admitting: Family Medicine

## 2024-03-01 DIAGNOSIS — E1169 Type 2 diabetes mellitus with other specified complication: Secondary | ICD-10-CM

## 2024-03-04 ENCOUNTER — Other Ambulatory Visit (HOSPITAL_COMMUNITY): Payer: Self-pay

## 2024-03-04 MED ORDER — FREESTYLE LIBRE 2 PLUS SENSOR MISC
1.0000 | 2 refills | Status: DC
  Filled 2024-03-04 – 2024-03-07 (×2): qty 3, 45d supply, fill #0
  Filled 2024-04-24: qty 3, 45d supply, fill #1
  Filled 2024-06-13: qty 3, 45d supply, fill #2

## 2024-03-07 ENCOUNTER — Other Ambulatory Visit: Payer: Self-pay

## 2024-03-07 ENCOUNTER — Other Ambulatory Visit (HOSPITAL_COMMUNITY): Payer: Self-pay

## 2024-03-12 ENCOUNTER — Ambulatory Visit: Admitting: Podiatry

## 2024-03-28 ENCOUNTER — Other Ambulatory Visit: Payer: Self-pay

## 2024-04-03 DIAGNOSIS — E119 Type 2 diabetes mellitus without complications: Secondary | ICD-10-CM | POA: Diagnosis not present

## 2024-04-03 DIAGNOSIS — H35033 Hypertensive retinopathy, bilateral: Secondary | ICD-10-CM | POA: Diagnosis not present

## 2024-04-03 DIAGNOSIS — H02535 Eyelid retraction left lower eyelid: Secondary | ICD-10-CM | POA: Diagnosis not present

## 2024-04-03 DIAGNOSIS — H2513 Age-related nuclear cataract, bilateral: Secondary | ICD-10-CM | POA: Diagnosis not present

## 2024-04-03 DIAGNOSIS — H02532 Eyelid retraction right lower eyelid: Secondary | ICD-10-CM | POA: Diagnosis not present

## 2024-04-03 LAB — HM DIABETES EYE EXAM

## 2024-04-08 ENCOUNTER — Other Ambulatory Visit: Payer: Self-pay | Admitting: Family Medicine

## 2024-04-08 ENCOUNTER — Other Ambulatory Visit (HOSPITAL_COMMUNITY): Payer: Self-pay

## 2024-04-08 MED ORDER — HYDROCHLOROTHIAZIDE 25 MG PO TABS
25.0000 mg | ORAL_TABLET | Freq: Every day | ORAL | 1 refills | Status: DC
  Filled 2024-04-08: qty 90, 90d supply, fill #0
  Filled 2024-07-09: qty 90, 90d supply, fill #1

## 2024-04-15 ENCOUNTER — Other Ambulatory Visit (HOSPITAL_COMMUNITY): Payer: Self-pay

## 2024-04-22 ENCOUNTER — Encounter (HOSPITAL_COMMUNITY): Payer: Self-pay

## 2024-04-22 ENCOUNTER — Encounter: Payer: Self-pay | Admitting: Family Medicine

## 2024-04-22 ENCOUNTER — Ambulatory Visit: Admitting: Internal Medicine

## 2024-04-22 ENCOUNTER — Other Ambulatory Visit: Payer: Self-pay

## 2024-04-22 ENCOUNTER — Ambulatory Visit: Payer: Self-pay | Admitting: *Deleted

## 2024-04-22 ENCOUNTER — Emergency Department (HOSPITAL_COMMUNITY)
Admission: EM | Admit: 2024-04-22 | Discharge: 2024-04-22 | Disposition: A | Discharge status: Home / Self Care | Source: Ambulatory Visit

## 2024-04-22 DIAGNOSIS — I10 Essential (primary) hypertension: Secondary | ICD-10-CM | POA: Insufficient documentation

## 2024-04-22 DIAGNOSIS — Z7982 Long term (current) use of aspirin: Secondary | ICD-10-CM | POA: Insufficient documentation

## 2024-04-22 DIAGNOSIS — E119 Type 2 diabetes mellitus without complications: Secondary | ICD-10-CM | POA: Diagnosis not present

## 2024-04-22 DIAGNOSIS — J45909 Unspecified asthma, uncomplicated: Secondary | ICD-10-CM | POA: Insufficient documentation

## 2024-04-22 DIAGNOSIS — Z794 Long term (current) use of insulin: Secondary | ICD-10-CM | POA: Diagnosis not present

## 2024-04-22 DIAGNOSIS — Z7984 Long term (current) use of oral hypoglycemic drugs: Secondary | ICD-10-CM | POA: Insufficient documentation

## 2024-04-22 DIAGNOSIS — Z7951 Long term (current) use of inhaled steroids: Secondary | ICD-10-CM | POA: Insufficient documentation

## 2024-04-22 DIAGNOSIS — I83899 Varicose veins of unspecified lower extremities with other complications: Secondary | ICD-10-CM

## 2024-04-22 DIAGNOSIS — I83892 Varicose veins of left lower extremities with other complications: Secondary | ICD-10-CM | POA: Diagnosis not present

## 2024-04-22 DIAGNOSIS — I83891 Varicose veins of right lower extremities with other complications: Secondary | ICD-10-CM | POA: Diagnosis not present

## 2024-04-22 NOTE — ED Provider Notes (Signed)
  EMERGENCY DEPARTMENT AT [facility] Provider Note   CSN: [id number] Arrival date & time: 04/22/24  1229     Patient presents with: Wound Check   Eileen Richards is a 63 y.o. female with medical history significant for type 2 diabetes, hypertension, hypercholesterolemia and asthma who presents emergency department for varicocele bleed.  Patient states that last night she was stepping out of the shower when the varicose veins on her right thigh began bleeding however this self resolved after 10 minutes.  Patient states that she was going to follow-up with her PCP however PCP advised she present to the emergency department for evaluation.  Patient denies active bleeding, fever, trauma or falls.  Denies any pain at this time    Wound Check       Prior to Admission medications   Medication Sig Start Date End Date Taking? Authorizing Provider  albuterol  (VENTOLIN  HFA) 108 (90 Base) MCG/ACT inhaler Inhale 2 puffs into the lungs every 6 (six) hours as needed for wheezing or shortness of breath. 02/21/23   Domenica Harlene LABOR, MD  aspirin 81 MG tablet Take 81 mg by mouth daily.    [provider]  Blood Pressure Monitoring (OMRON 3 SERIES BP MONITOR) DEVI Use as directed 05/15/23     carvedilol  (COREG ) 3.125 MG tablet Take 1 tablet (3.125 mg total) by mouth 2 (two) times daily. 11/06/23   Domenica Harlene LABOR, MD  cetirizine  (ZYRTEC ) 10 MG tablet TAKE 1 TABLET BY MOUTH 2 TIMES DAILY. Patient taking differently: Take 10 mg by mouth 2 (two) times daily. 02/14/20   Domenica Harlene LABOR, MD  Continuous Blood Gluc Receiver (FREESTYLE LIBRE 2 READER) DEVI USE AS DIRECTED. Patient taking differently: 1 each by Other route See admin instructions. Glucose reading 12/17/20   Kassie Mallick, MD  Continuous Glucose Sensor (FREESTYLE LIBRE 2 PLUS SENSOR) MISC Use to continuously monitor blood glucose, changing the sensor every 15 days. 03/04/24   Domenica Harlene LABOR, MD  cycloSPORINE  (RESTASIS ) 0.05 %  ophthalmic emulsion Instill 1 drop into both eyes twice a day 03/28/23     diclofenac  Sodium (VOLTAREN ) 1 % GEL Apply 2 g topically 4 (four) times daily. Rub into affected area of foot 2 to 4 times daily Patient taking differently: Apply 2 g topically as needed (Joint pain). Rub into affected area of foot 2 to 4 times daily 05/12/20   Gershon Donnice SAUNDERS, DPM  famotidine  (PEPCID ) 20 MG tablet Take 1 tablet (20 mg total) by mouth 2 (two) times daily. 11/06/23   Domenica Harlene LABOR, MD  fluticasone  (FLOVENT  HFA) 110 MCG/ACT inhaler Inhale 1 puff into the lungs 2 (two) times daily. 09/28/23   Domenica Harlene LABOR, MD  gabapentin  (NEURONTIN ) 300 MG capsule Take 1-2 capsules (300-600 mg total) by mouth 3 (three) times daily. 11/20/23   Domenica Harlene LABOR, MD  hydrochlorothiazide  (HYDRODIURIL ) 25 MG tablet Take 1 tablet (25 mg total) by mouth daily. 04/08/24   Domenica Harlene LABOR, MD  insulin  glargine-yfgn (SEMGLEE , YFGN,) 100 UNIT/ML Pen Inject 48 Units into the skin every morning. 11/14/23   Webb, Padonda B, FNP  Insulin  Pen Needle (TECHLITE PEN NEEDLES) 32G X 4 MM MISC Use once daily 05/02/23   Domenica Harlene LABOR, MD  Iron -FA-B Cmp-C-Biot-Probiotic (FUSION PLUS) CAPS Take 1 capsule by mouth daily. 04/28/23   Franchot Lauraine HERO, NP  meloxicam  (MOBIC ) 7.5 MG tablet Take 1-2 tablets (7.5-15 mg total) by mouth daily. 09/28/23   Domenica Harlene LABOR, MD  metFORMIN  (  GLUCOPHAGE -XR) 500 MG 24 hr tablet Take 4 tablets (2,000 mg total) by mouth daily. 01/03/24   Domenica Harlene LABOR, MD  nitroGLYCERIN  (NITROSTAT ) 0.4 MG SL tablet Place 1 tablet (0.4 mg total) under the tongue every 5 (five) minutes x 3 doses as needed for chest pain. 06/26/18   Amon Aloysius BRAVO, MD  pantoprazole  (PROTONIX ) 40 MG tablet Take 1 tablet (40 mg total) by mouth daily. 11/06/23   Domenica Harlene LABOR, MD  potassium chloride  SA (KLOR-CON  M) 20 MEQ tablet Take 1 tablet (20 mEq total) by mouth daily. 01/26/24   Domenica Harlene LABOR, MD  promethazine  (PHENERGAN ) 25 MG tablet Take 1 tablet (25 mg total) by  mouth every 8 (eight) hours as needed for nausea or vomiting. 02/01/18   Domenica Harlene LABOR, MD  rosuvastatin  (CRESTOR ) 40 MG tablet Take 1 tablet (40 mg total) by mouth daily at bedtime 02/08/24   Yacopino, Jessica L, NP  tirzepatide  (MOUNJARO ) 12.5 MG/0.5ML Pen Inject 12.5 mg into the skin once a week. 09/28/23   Domenica Harlene LABOR, MD  tiZANidine  (ZANAFLEX ) 2 MG tablet Take 1/2 - 2 tablets (1-4 mg total) by mouth 2 (two) times daily as needed for muscle spasms. 01/19/23   Domenica Harlene LABOR, MD  traMADol  (ULTRAM ) 50 MG tablet Take 1 tablet (50 mg total) by mouth 3 (three) times daily as needed. 11/20/23   Domenica Harlene LABOR, MD    Allergies: Farxiga  [dapagliflozin ]    Review of Systems  Updated Vital Signs BP 137/79   Pulse 82   Temp 97.7 F (36.5 C)   Resp 18   SpO2 100%   Physical Exam Vitals reviewed.  Constitutional:      General: She is not in acute distress.    Appearance: She is not ill-appearing.  Cardiovascular:     Rate and Rhythm: Normal rate and regular rhythm.     Pulses:          Femoral pulses are 2+ on the right side.      Dorsalis pedis pulses are 2+ on the right side and 2+ on the left side.     Heart sounds: Normal heart sounds. No murmur heard. Pulmonary:     Effort: Pulmonary effort is normal. No tachypnea.  Musculoskeletal:     Right lower leg: No edema.     Left lower leg: No edema.       Legs:  Neurological:     Mental Status: She is alert.  Psychiatric:        Behavior: Behavior is cooperative.     (all labs ordered are listed, but only abnormal results are displayed) Labs Reviewed - No data to display  EKG: None  Radiology: No results found.   Procedures   Medications Ordered in the ED - No data to display                                  Medical Decision Making  On initial evaluation patient is hemodynamically stable, afebrile and not in acute distress.  Physical examination performed with evidence of varicose veins on right thigh.  After  palpating, varicose veins began bleeding however was hemostatic after direct pressure was held for approximately 10 minutes.  Patient's varicose vein bandaged.  At this time believe patient is safe to be discharged home as she is neurovascularly intact, hemodynamically stable with palpable distal pulses and varicose veins hemostatic after direct pressure.  Will give strict return precautions, outpatient ambulatory referral to vascular surgery and recommendations to follow-up with the patient's primary care provider.  At the time of discharge patient agreed with and understood plan of care and had no further questions     Final diagnoses:  Bleeding from varicose vein    ED Discharge Orders     None       Lavanda Bolster DO Emergency Medicine PGY2   Bolster Lavanda, DO 04/22/24 1636    Simon Lavonia SAILOR, MD 04/23/24 [phone]

## 2024-04-22 NOTE — ED Notes (Signed)
 Pt gives verbal consent for mse

## 2024-04-22 NOTE — ED Notes (Signed)
 Quick clot applied to varicose vein at provider request.

## 2024-04-22 NOTE — ED Triage Notes (Signed)
 Pt to er, pt states that she has a veracious vein the was bleeding.  States that it started bleeding last night, states that the bleeding has stopped now.  States that she called her PMD and was told to come in an get checked out.

## 2024-04-22 NOTE — Telephone Encounter (Signed)
 FYI Only or Action Required?: FYI only for provider.  Patient was last seen in primary care on 02/08/2024 by Wheeler Harlene CROME, NP.  Called Nurse Triage reporting Bleeding/Bruising.  Symptoms began yesterday.  Interventions attempted: Other: applied pressure x 10 minutes .  Symptoms are: stable.  Triage Disposition: Go to ED Now (or PCP Triage)  Patient/caregiver understands and will follow disposition?: Yes             Copied from CRM [id number]. Topic: Clinical - Red Word Triage >> Apr 22, 2024 11:46 AM Robinson H wrote: [facility] that prompted transfer to Nurse Triage: Veins in leg are bleeding Reason for Disposition  Patient sounds very sick or weak to the triager  Answer Assessment - Initial Assessment Questions Patient reports last night bleeding noted from varicose vein on top of right thigh and spurting out. Took 10 minutes to stop bleeding after applying pressure. No bleeding now. Patient did not want to go to ED and sit for hours. Patient only wanted appt. After review, NT called patient back to instruct to go to ED for evaluation due to possible medical emergency if bleeding reoccurs. Patient answered on 2nd call and agreed to go to ED.     1. ONSET: When did the pain start?      Last night after noting varicose vein bleeding and spurting out blood. No bleeding now.  2. LOCATION: Where is the pain located?      Right upper top of thigh 3. PAIN: How bad is the pain?    (Scale 1-10; or mild, moderate, severe)     na 4. WORK OR EXERCISE: Has there been any recent work or exercise that involved this part of the body?      na 5. CAUSE: What do you think is causing the leg pain?     Na  6. OTHER SYMPTOMS: Do you have any other symptoms? (e.g., chest pain, back pain, breathing difficulty, swelling, rash, fever, numbness, weakness)     Denies bleeding now , took 10 minutes last night for bleeding to stop. 7. PREGNANCY: Is there any chance you are  pregnant? When was your last menstrual period?     na  Protocols used: Leg Pain-A-AH

## 2024-04-22 NOTE — Discharge Instructions (Signed)
 Thank you for allowing us  to care for you today.  Please call the number provided for vascular surgery and follow-up with your primary care provider.  If this recurs please be sure to apply pressure for 10 to 15 minutes.  Please return to the emergency department if after holding pressure for 15 minutes you are unable to control the bleeding.

## 2024-04-24 ENCOUNTER — Other Ambulatory Visit: Payer: Self-pay | Admitting: Family Medicine

## 2024-04-24 ENCOUNTER — Other Ambulatory Visit (HOSPITAL_COMMUNITY): Payer: Self-pay

## 2024-04-24 ENCOUNTER — Other Ambulatory Visit: Payer: Self-pay

## 2024-04-24 MED ORDER — METFORMIN HCL ER 500 MG PO TB24
2000.0000 mg | ORAL_TABLET | Freq: Every day | ORAL | 0 refills | Status: DC
  Filled 2024-04-24: qty 360, 90d supply, fill #0

## 2024-05-06 ENCOUNTER — Other Ambulatory Visit: Payer: Self-pay | Admitting: Family Medicine

## 2024-05-07 ENCOUNTER — Other Ambulatory Visit: Payer: Self-pay

## 2024-05-07 ENCOUNTER — Other Ambulatory Visit (HOSPITAL_COMMUNITY): Payer: Self-pay

## 2024-05-07 MED ORDER — FAMOTIDINE 20 MG PO TABS
20.0000 mg | ORAL_TABLET | Freq: Two times a day (BID) | ORAL | 1 refills | Status: AC
  Filled 2024-05-07: qty 180, 90d supply, fill #0
  Filled 2024-08-10: qty 180, 90d supply, fill #1

## 2024-05-07 MED ORDER — PANTOPRAZOLE SODIUM 40 MG PO TBEC
40.0000 mg | DELAYED_RELEASE_TABLET | Freq: Every day | ORAL | 1 refills | Status: AC
  Filled 2024-05-07: qty 90, 90d supply, fill #0
  Filled 2024-08-10: qty 90, 90d supply, fill #1

## 2024-05-07 MED ORDER — CARVEDILOL 3.125 MG PO TABS
3.1250 mg | ORAL_TABLET | Freq: Two times a day (BID) | ORAL | 1 refills | Status: AC
  Filled 2024-05-07: qty 180, 90d supply, fill #0
  Filled 2024-08-10: qty 180, 90d supply, fill #1

## 2024-05-07 MED ORDER — POTASSIUM CHLORIDE CRYS ER 20 MEQ PO TBCR
20.0000 meq | EXTENDED_RELEASE_TABLET | Freq: Every day | ORAL | 0 refills | Status: DC
  Filled 2024-05-07: qty 90, 90d supply, fill #0

## 2024-05-10 ENCOUNTER — Other Ambulatory Visit: Payer: Self-pay

## 2024-05-16 ENCOUNTER — Other Ambulatory Visit (HOSPITAL_COMMUNITY): Payer: Self-pay

## 2024-05-16 ENCOUNTER — Other Ambulatory Visit: Payer: Self-pay

## 2024-05-16 DIAGNOSIS — Z1231 Encounter for screening mammogram for malignant neoplasm of breast: Secondary | ICD-10-CM | POA: Diagnosis not present

## 2024-05-16 LAB — HM MAMMOGRAPHY

## 2024-05-21 ENCOUNTER — Other Ambulatory Visit: Payer: Self-pay | Admitting: Family

## 2024-05-21 ENCOUNTER — Other Ambulatory Visit (HOSPITAL_COMMUNITY): Payer: Self-pay

## 2024-05-21 DIAGNOSIS — D509 Iron deficiency anemia, unspecified: Secondary | ICD-10-CM

## 2024-05-22 ENCOUNTER — Other Ambulatory Visit (HOSPITAL_COMMUNITY): Payer: Self-pay

## 2024-05-22 MED ORDER — FUSION PLUS PO CAPS
1.0000 | ORAL_CAPSULE | Freq: Every day | ORAL | 11 refills | Status: AC
  Filled 2024-05-30: qty 30, 30d supply, fill #0
  Filled 2024-07-09: qty 30, 30d supply, fill #1
  Filled 2024-08-10: qty 30, 30d supply, fill #2
  Filled 2024-09-25: qty 30, 30d supply, fill #3
  Filled 2024-10-22: qty 30, 30d supply, fill #4

## 2024-05-28 ENCOUNTER — Other Ambulatory Visit (HOSPITAL_COMMUNITY): Payer: Self-pay

## 2024-05-30 ENCOUNTER — Other Ambulatory Visit: Payer: Self-pay

## 2024-05-30 ENCOUNTER — Other Ambulatory Visit (HOSPITAL_COMMUNITY): Payer: Self-pay

## 2024-05-30 ENCOUNTER — Encounter (HOSPITAL_COMMUNITY): Payer: Self-pay

## 2024-05-31 ENCOUNTER — Other Ambulatory Visit (HOSPITAL_COMMUNITY): Payer: Self-pay

## 2024-06-02 NOTE — Assessment & Plan Note (Deleted)
 Well controlled, no changes to meds. Encouraged heart healthy diet such as the DASH diet and exercise as tolerated.

## 2024-06-02 NOTE — Assessment & Plan Note (Deleted)
Encourage heart healthy diet such as MIND or DASH diet, increase exercise, avoid trans fats, simple carbohydrates and processed foods, consider a krill or fish or flaxseed oil cap daily. Tolerating statins 

## 2024-06-02 NOTE — Assessment & Plan Note (Deleted)
 Hydrate and monitor

## 2024-06-02 NOTE — Assessment & Plan Note (Deleted)
 monitor

## 2024-06-02 NOTE — Assessment & Plan Note (Deleted)
 Patient encouraged to maintain heart healthy diet, regular exercise, adequate sleep. Consider daily probiotics. Take medications as prescribed. Labs ordered and reviewed. Colonoscopy was 2019 repeat in 2024 referred back to Dr Kristie. Follows with OB/GYN for paps with Dr Rutherford, wil request records, [facility] August 2024 repeatevery 1-2 years

## 2024-06-02 NOTE — Assessment & Plan Note (Deleted)
 Encouraged DASH or MIND diet, decrease po intake and increase exercise as tolerated. Needs 7-8 hours of sleep nightly. Avoid trans fats, eat small, frequent meals every 4-5 hours with lean proteins, complex carbs and healthy fats. Minimize simple carbs, high fat foods and processed foods

## 2024-06-06 ENCOUNTER — Encounter: Payer: Commercial Managed Care - PPO | Admitting: Family Medicine

## 2024-06-06 DIAGNOSIS — R700 Elevated erythrocyte sedimentation rate: Secondary | ICD-10-CM

## 2024-06-06 DIAGNOSIS — E6609 Other obesity due to excess calories: Secondary | ICD-10-CM

## 2024-06-06 DIAGNOSIS — M791 Myalgia, unspecified site: Secondary | ICD-10-CM

## 2024-06-06 DIAGNOSIS — I10 Essential (primary) hypertension: Secondary | ICD-10-CM

## 2024-06-06 DIAGNOSIS — E782 Mixed hyperlipidemia: Secondary | ICD-10-CM

## 2024-06-06 DIAGNOSIS — Z0000 Encounter for general adult medical examination without abnormal findings: Secondary | ICD-10-CM

## 2024-06-10 DIAGNOSIS — Z0000 Encounter for general adult medical examination without abnormal findings: Secondary | ICD-10-CM | POA: Insufficient documentation

## 2024-06-10 NOTE — Assessment & Plan Note (Signed)
 Well controlled, no changes to meds. Encouraged heart healthy diet such as the DASH diet and exercise as tolerated.

## 2024-06-10 NOTE — Assessment & Plan Note (Signed)
 HCM -Colonoscopy: Last 02/2018, due 02/2028 -Pap: Follows with Dr. Rutherford, request records - MGM: Last 04/2024, due 04/2026 - Immunizations: Influenza due

## 2024-06-10 NOTE — Progress Notes (Unsigned)
 Subjective:     Patient ID: Eileen Richards, female    DOB: 06/25/1961, 62 y.o.   MRN: [id number]  No chief complaint on file.   HPI  Discussed the use of AI scribe software for clinical note transcription with the patient, who gave verbal consent to proceed.  History of Present Illness Eileen Richards is a 63 year old female with hypertension and diabetes who presents for medication management and follow-up.  She monitors her blood pressure at home with systolic readings consistently around 120-130 mmHg. She takes carvedilol  and hydrochlorothiazide . Her diabetes management includes long-acting insulin ,  metformin , and moujaro. Morning hypoglycemia is noted with blood sugar levels of 66, 67, and 60 mg/dL around 9 AM. A morning blood sugar of 175 mg/dL was recorded at 4:94 AM, attributed to dietary intake the previous night. Her last A1c is 6.6%.  She has a vein issue on her leg and has been referred to a vein specialist but has not yet attended an appointment. Upcoming appointment soon. She is physically active at work, works in Training and development officer at [employer]. She cooks most meals but occasionally eats out. She does not engage in regular exercise beyond work activities. She is current with dental and eye exams and plans to receive a flu vaccine at work.  Follows with podiatry  HYPERTENSION Carvedilol  3.125 mg twice daily, HCTZ 25 mg daily BP at home: Denies  DM Type 2 Metformin  2000 mg; insulin  glargine, and Mounjaro  12.5 MG BS at home: 70s-140s Denies hypoglycemic events, polydipsia, polyuria  Wt Readings from Last 3 Encounters:  06/13/24 221 lb 12.8 oz (100.6 kg)  02/08/24 222 lb (100.7 kg)  09/28/23 230 lb 12.8 oz (104.7 kg)     HLD Rosuvastatin  40 mg at bedtime  GERD Pantoprazole  40 mg daily, Pepcid  20 mg daily  Reports taking medications as prescribed, denies adverse side effects.  HCM -Colonoscopy: Last 02/2018, due 02/2028 -Pap: Recently updated, WNL-Dr.  Cousins/GYN - MGM: Last 04/2024, due 04/2026 - Immunizations: Influenza due   Patient denies fever, chills, SOB, CP, palpitations, dyspnea, edema, HA, vision changes, N/V/D, abdominal pain, urinary symptoms, rash, weight changes, and recent illness or hospitalizations.    History of Present Illness              Health Maintenance Due  Topic Date Due   Diabetic kidney evaluation - Urine ACR  01/01/2013    Past Medical History:  Diagnosis Date   Achilles tendonitis 10/14/2012   Asthma    childhood   Atypical chest pain    normal stress echo 02/2009 -LV size was normal LV global systolic function was normal- Normal wall motion; no LV regional wall motion abnormalities   Benign paroxysmal positional vertigo 04/26/2015   Chest pain, atypical 08/24/2013   Diabetes mellitus    GERD (gastroesophageal reflux disease)    Hand pain, left 11/10/2015   Hyperlipemia    Hypertension    Neuropathic pain of ankle, left 04/27/2017   Onychomycosis 04/17/2014   Pain of right heel 10/14/2012   Preventative health care 11/22/2015   Sinusitis, acute 01/17/2016   Thoracic outlet syndrome 11/03/2014   Urinary urgency 12/15/2015    Past Surgical History:  Procedure Laterality Date   ABDOMINAL HYSTERECTOMY     CYSTOSCOPY  07/23/2012   Procedure: CYSTOSCOPY;  Surgeon: Dickie DELENA Carder, MD;  Location: [facility];  Service: Gynecology;  Laterality: N/A;   DILATION AND CURETTAGE OF UTERUS     Lysis of Adhesions   ROBOTIC ASSISTED  LAPAROSCOPIC LYSIS OF ADHESION  07/23/2012   Procedure: ROBOTIC ASSISTED LAPAROSCOPIC LYSIS OF ADHESION;  Surgeon: Dickie DELENA Carder, MD;  Location: [facility];  Service: Gynecology;  Laterality: N/A;   ROBOTIC ASSISTED SALPINGO OOPHERECTOMY  07/23/2012   Procedure: ROBOTIC ASSISTED SALPINGO OOPHERECTOMY;  Surgeon: Dickie DELENA Carder, MD;  Location: [facility];  Service: Gynecology;  Laterality: Bilateral;    Family History  Problem Relation Age of Onset   GER disease Mother    GER  disease Father    Heart disease Father        father has heart problems   Anemia Father    Dementia Maternal Aunt    Miscarriages / Stillbirths Maternal Uncle    Depression Paternal Grandmother    Asthma Other    Hypertension Other    Diabetes Other     Social History   Socioeconomic History   Marital status: Single    Spouse name: Not on file   Number of children: Not on file   Years of education: Not on file   Highest education level: Not on file  Occupational History   Not on file  Tobacco Use   Smoking status: Former    Current packs/day: 0.00    Types: Cigarettes    Quit date: 02/17/2014    Years since quitting: 10.3   Smokeless tobacco: Never  Vaping Use   Vaping status: Never Used  Substance and Sexual Activity   Alcohol use: No   Drug use: No   Sexual activity: Not on file  Other Topics Concern   Not on file  Social History Narrative   Not on file   Social Drivers of Health   Financial Resource Strain: Not on file  Food Insecurity: Not on file  Transportation Needs: Not on file  Physical Activity: Not on file  Stress: Not on file  Social Connections: Not on file  Intimate Partner Violence: Not on file    Outpatient Medications Prior to Visit  Medication Sig Dispense Refill   albuterol  (VENTOLIN  HFA) 108 (90 Base) MCG/ACT inhaler Inhale 2 puffs into the lungs every 6 (six) hours as needed for wheezing or shortness of breath. 6.7 g 5   aspirin 81 MG tablet Take 81 mg by mouth daily.     Blood Pressure Monitoring (OMRON 3 SERIES BP MONITOR) DEVI Use as directed 1 each 0   carvedilol  (COREG ) 3.125 MG tablet Take 1 tablet (3.125 mg total) by mouth 2 (two) times daily. 180 tablet 1   cetirizine  (ZYRTEC ) 10 MG tablet TAKE 1 TABLET BY MOUTH 2 TIMES DAILY. (Patient taking differently: Take 10 mg by mouth 2 (two) times daily.) 200 tablet 1   Continuous Blood Gluc Receiver (FREESTYLE LIBRE 2 READER) DEVI USE AS DIRECTED. (Patient taking differently: 1 each by  Other route See admin instructions. Glucose reading) 1 each 1   Continuous Glucose Sensor (FREESTYLE LIBRE 2 PLUS SENSOR) MISC Use to continuously monitor blood glucose, changing the sensor every 15 days. 3 each 2   cycloSPORINE  (RESTASIS ) 0.05 % ophthalmic emulsion Instill 1 drop into both eyes twice a day 180 each 3   diclofenac  Sodium (VOLTAREN ) 1 % GEL Apply 2 g topically 4 (four) times daily. Rub into affected area of foot 2 to 4 times daily (Patient taking differently: Apply 2 g topically as needed (Joint pain). Rub into affected area of foot 2 to 4 times daily) 100 g 2   famotidine  (PEPCID ) 20 MG tablet Take 1 tablet (20 mg  total) by mouth 2 (two) times daily. 180 tablet 1   fluticasone  (FLOVENT  HFA) 110 MCG/ACT inhaler Inhale 1 puff into the lungs 2 (two) times daily. 12 g 5   gabapentin  (NEURONTIN ) 300 MG capsule Take 1-2 capsules (300-600 mg total) by mouth 3 (three) times daily. 180 capsule 3   hydrochlorothiazide  (HYDRODIURIL ) 25 MG tablet Take 1 tablet (25 mg total) by mouth daily. 90 tablet 1   insulin  glargine-yfgn (SEMGLEE , YFGN,) 100 UNIT/ML Pen Inject 48 Units into the skin every morning. 60 mL 1   Insulin  Pen Needle (TECHLITE PEN NEEDLES) 32G X 4 MM MISC Use once daily 100 each 3   Iron -FA-B Cmp-C-Biot-Probiotic (FUSION PLUS) CAPS Take 1 capsule by mouth daily. 30 capsule 11   meloxicam  (MOBIC ) 7.5 MG tablet Take 1-2 tablets (7.5-15 mg total) by mouth daily. 60 tablet 2   metFORMIN  (GLUCOPHAGE -XR) 500 MG 24 hr tablet Take 4 tablets (2,000 mg total) by mouth daily. 360 tablet 0   nitroGLYCERIN  (NITROSTAT ) 0.4 MG SL tablet Place 1 tablet (0.4 mg total) under the tongue every 5 (five) minutes x 3 doses as needed for chest pain. 40 tablet 1   pantoprazole  (PROTONIX ) 40 MG tablet Take 1 tablet (40 mg total) by mouth daily. 90 tablet 1   potassium chloride  SA (KLOR-CON  M) 20 MEQ tablet Take 1 tablet (20 mEq total) by mouth daily. 90 tablet 0   promethazine  (PHENERGAN ) 25 MG tablet Take 1  tablet (25 mg total) by mouth every 8 (eight) hours as needed for nausea or vomiting. 20 tablet 0   rosuvastatin  (CRESTOR ) 40 MG tablet Take 1 tablet (40 mg total) by mouth daily at bedtime 90 tablet 1   tirzepatide  (MOUNJARO ) 12.5 MG/0.5ML Pen Inject 12.5 mg into the skin once a week. 6 mL 2   tiZANidine  (ZANAFLEX ) 2 MG tablet Take 1/2 - 2 tablets (1-4 mg total) by mouth 2 (two) times daily as needed for muscle spasms. 30 tablet 1   traMADol  (ULTRAM ) 50 MG tablet Take 1 tablet (50 mg total) by mouth 3 (three) times daily as needed. 90 tablet 0   No facility-administered medications prior to visit.    Allergies  Allergen Reactions   Farxiga  [Dapagliflozin ] Hives    ROS    See HPI Objective:    Physical Exam Constitutional:      General: She is not in acute distress.    Appearance: She is obese. She is not ill-appearing, toxic-appearing or diaphoretic.  HENT:     Head: Normocephalic and atraumatic.     Right Ear: Tympanic membrane, ear canal and external ear normal.     Left Ear: Tympanic membrane, ear canal and external ear normal. There is impacted cerumen.     Nose: Nose normal. No congestion.     Mouth/Throat:     Mouth: Mucous membranes are moist.     Pharynx: Oropharynx is clear.  Eyes:     Extraocular Movements: Extraocular movements intact.     Right eye: Normal extraocular motion.     Left eye: Normal extraocular motion.     Conjunctiva/sclera: Conjunctivae normal.     Pupils: Pupils are equal, round, and reactive to light.  Neck:     Thyroid : No thyroid  mass or thyromegaly.     Vascular: No carotid bruit or JVD.  Cardiovascular:     Rate and Rhythm: Normal rate and regular rhythm.     Pulses:          Dorsalis pedis pulses are  1+ on the right side and 1+ on the left side.     Heart sounds: Normal heart sounds, S1 normal and S2 normal. No murmur heard.    No friction rub. No gallop.  Pulmonary:     Effort: Pulmonary effort is normal. No respiratory distress.      Breath sounds: Normal breath sounds.  Abdominal:     General: Bowel sounds are normal. There is no distension.     Palpations: Abdomen is soft.     Tenderness: There is no abdominal tenderness. There is no guarding.  Musculoskeletal:        General: Swelling present. Normal range of motion.     Cervical back: Full passive range of motion without pain and normal range of motion. No edema or erythema.     Right lower leg: No edema.     Left lower leg: No edema.     Comments: +1 b/l LE edema   Lymphadenopathy:     Cervical: No cervical adenopathy.  Skin:    General: Skin is warm and dry.     Capillary Refill: Capillary refill takes less than 2 seconds.         Comments: Eczema, R arm  Neurological:     General: No focal deficit present.     Mental Status: She is alert and oriented to person, place, and time.     Cranial Nerves: No cranial nerve deficit.     Motor: No weakness.     Coordination: Coordination normal.     Gait: Gait normal.     Deep Tendon Reflexes: Reflexes normal.  Psychiatric:        Mood and Affect: Mood normal.        Behavior: Behavior normal.        Thought Content: Thought content normal.     BP 136/78   Pulse 69   Temp (!) 97.5 F (36.4 C)   Ht 5' 4 (1.626 m)   Wt 221 lb 12.8 oz (100.6 kg)   SpO2 99%   BMI 38.07 kg/m  Wt Readings from Last 3 Encounters:  06/13/24 221 lb 12.8 oz (100.6 kg)  02/08/24 222 lb (100.7 kg)  09/28/23 230 lb 12.8 oz (104.7 kg)       Assessment & Plan:   Problem List Items Addressed This Visit     Annual visit for general adult medical examination without abnormal findings - Primary   Patient encouraged to maintain heart healthy diet, regular exercise, adequate sleep. Consider daily probiotics. Take medications as prescribed.        Diabetes mellitus type 2 in obese   Recheck A1c. Encourge Pt to minimize simple carbs. Increase exercise as tolerated. Continue current meds.       Relevant Orders   Urine  Microalbumin w/creat. ratio   HgB A1c   Eczema   Relevant Medications   clobetasol  ointment (TEMOVATE ) 0.05 %   Essential hypertension   Well controlled, no changes to meds. Encouraged heart healthy diet such as the DASH diet and exercise as tolerated.        Relevant Orders   CBC with Differential/Platelet   TSH   GERD (gastroesophageal reflux disease)    Counseled on lifestyle modifications including avoiding trigger foods (spicy, fatty, acidic), eating smaller meals, not lying down within 2-3 hours of eating, elevating head of bed, and weight management if applicable.       Hyperlipidemia, mixed   On 40 mg Crestor  qd. Tolerating Well. Encouraged.  Encourage heart healthy diet such as DASH diet, increase exercise, avoid trans fats, simple carbohydrates and processed foods, consider a krill or fish or flaxseed oil cap daily.        Relevant Orders   Comprehensive metabolic panel with GFR   Lipid panel   Obesity   Encouraged DASH or MIND diet, decrease po intake and increase exercise as tolerated. Needs 7-8 hours of sleep nightly. Avoid trans fats, eat small, frequent meals every 4-5 hours with lean proteins, complex carbs and healthy fats. Minimize simple carbs, high fat foods and processed foods        Preventative health care   HCM -Colonoscopy: Last 02/2018, due 02/2028 -Pap: Follows with Dr. Rutherford, request records - MGM: Last 04/2024, due 04/2026 - Immunizations: Influenza due      Other Visit Diagnoses       Left ear impacted cerumen           HCM -Colonoscopy: Last 02/2018, due 02/2028 -Pap: Recently updated, WNL-Dr. Cousins/GYN - MGM: Last 04/2024, due 04/2026 - Immunizations: Influenza due, Pt to receive at work.   Type 2 diabetes mellitus - Monitor blood glucose regularly, adjust insulin  if morning readings are low, titration instructions provided. Offered Diabetic counseling, Pt denies at this time.  - Discuss insulin  cost with pharmacy, explore Lantus  or  coupons. - Consider diabetic educator referral for insulin  titration education.  Cerumen impaction, left ear Cerumen impaction in left ear causing blockage, no acute symptoms. - Recommend Debrox OTC for cerumen removal. - Return for irrigation if blockage persists.  FU 6 months  I am having Jon Geralds maintain her aspirin, promethazine , nitroGLYCERIN , cetirizine , diclofenac  Sodium, FreeStyle Libre 2 Reader, tiZANidine , albuterol , cycloSPORINE , TechLite Pen Needles, Omron 3 Series BP Monitor, meloxicam , tirzepatide , fluticasone , insulin  glargine-yfgn, gabapentin , traMADol , rosuvastatin , FreeStyle Libre 2 Plus Sensor, hydrochlorothiazide , metFORMIN , famotidine , pantoprazole , carvedilol , potassium chloride  SA, Fusion Plus, and clobetasol  ointment.  Meds ordered this encounter  Medications   clobetasol  ointment (TEMOVATE ) 0.05 %    Sig: Apply 1 Application topically 2 (two) times daily.    Dispense:  60 g    Refill:  2    Supervising Provider:   DOMENICA BLACKBIRD A [[id number]]

## 2024-06-10 NOTE — Assessment & Plan Note (Addendum)
 Counseled on lifestyle modifications including avoiding trigger foods (spicy, fatty, acidic), eating smaller meals, not lying down within 2-3 hours of eating, elevating head of bed, and weight management if applicable.

## 2024-06-10 NOTE — Assessment & Plan Note (Signed)
 Recheck A1c. Encourge Pt to minimize simple carbs. Increase exercise as tolerated. Continue current meds.

## 2024-06-10 NOTE — Assessment & Plan Note (Signed)
 Patient encouraged to maintain heart healthy diet, regular exercise, adequate sleep. Consider daily probiotics. Take medications as prescribed

## 2024-06-10 NOTE — Assessment & Plan Note (Signed)
 On 40 mg Crestor  qd. Tolerating Well. Encouraged. Encourage heart healthy diet such as DASH diet, increase exercise, avoid trans fats, simple carbohydrates and processed foods, consider a krill or fish or flaxseed oil cap daily.

## 2024-06-13 ENCOUNTER — Encounter: Payer: Self-pay | Admitting: Student

## 2024-06-13 ENCOUNTER — Other Ambulatory Visit (HOSPITAL_COMMUNITY): Payer: Self-pay

## 2024-06-13 ENCOUNTER — Ambulatory Visit: Admitting: Student

## 2024-06-13 VITALS — BP 136/78 | HR 69 | Temp 97.5°F | Ht 64.0 in | Wt 221.8 lb

## 2024-06-13 DIAGNOSIS — E6609 Other obesity due to excess calories: Secondary | ICD-10-CM | POA: Diagnosis not present

## 2024-06-13 DIAGNOSIS — E1169 Type 2 diabetes mellitus with other specified complication: Secondary | ICD-10-CM

## 2024-06-13 DIAGNOSIS — E669 Obesity, unspecified: Secondary | ICD-10-CM | POA: Diagnosis not present

## 2024-06-13 DIAGNOSIS — E66812 Obesity, class 2: Secondary | ICD-10-CM | POA: Diagnosis not present

## 2024-06-13 DIAGNOSIS — I10 Essential (primary) hypertension: Secondary | ICD-10-CM

## 2024-06-13 DIAGNOSIS — E782 Mixed hyperlipidemia: Secondary | ICD-10-CM

## 2024-06-13 DIAGNOSIS — L308 Other specified dermatitis: Secondary | ICD-10-CM | POA: Diagnosis not present

## 2024-06-13 DIAGNOSIS — Z6838 Body mass index (BMI) 38.0-38.9, adult: Secondary | ICD-10-CM

## 2024-06-13 DIAGNOSIS — Z0000 Encounter for general adult medical examination without abnormal findings: Secondary | ICD-10-CM | POA: Diagnosis not present

## 2024-06-13 DIAGNOSIS — L309 Dermatitis, unspecified: Secondary | ICD-10-CM | POA: Insufficient documentation

## 2024-06-13 DIAGNOSIS — K219 Gastro-esophageal reflux disease without esophagitis: Secondary | ICD-10-CM | POA: Diagnosis not present

## 2024-06-13 DIAGNOSIS — H6122 Impacted cerumen, left ear: Secondary | ICD-10-CM | POA: Diagnosis not present

## 2024-06-13 LAB — CBC WITH DIFFERENTIAL/PLATELET
Basophils Absolute: 0.1 K/uL (ref 0.0–0.1)
Basophils Relative: 0.9 % (ref 0.0–3.0)
Eosinophils Absolute: 0.2 K/uL (ref 0.0–0.7)
Eosinophils Relative: 2.6 % (ref 0.0–5.0)
HCT: 46.2 % — ABNORMAL HIGH (ref 36.0–46.0)
Hemoglobin: 14.9 g/dL (ref 12.0–15.0)
Lymphocytes Relative: 20.1 % (ref 12.0–46.0)
Lymphs Abs: 1.9 K/uL (ref 0.7–4.0)
MCHC: 32.3 g/dL (ref 30.0–36.0)
MCV: 89.8 fl (ref 78.0–100.0)
Monocytes Absolute: 0.4 K/uL (ref 0.1–1.0)
Monocytes Relative: 4.4 % (ref 3.0–12.0)
Neutro Abs: 6.9 K/uL (ref 1.4–7.7)
Neutrophils Relative %: 72 % (ref 43.0–77.0)
Platelets: 506 K/uL — ABNORMAL HIGH (ref 150.0–400.0)
RBC: 5.15 Mil/uL — ABNORMAL HIGH (ref 3.87–5.11)
RDW: 14.2 % (ref 11.5–15.5)
WBC: 9.6 K/uL (ref 4.0–10.5)

## 2024-06-13 LAB — COMPREHENSIVE METABOLIC PANEL WITH GFR
ALT: 68 U/L — ABNORMAL HIGH (ref 0–35)
AST: 25 U/L (ref 0–37)
Albumin: 4.6 g/dL (ref 3.5–5.2)
Alkaline Phosphatase: 104 U/L (ref 39–117)
BUN: 14 mg/dL (ref 6–23)
CO2: 33 meq/L — ABNORMAL HIGH (ref 19–32)
Calcium: 9.8 mg/dL (ref 8.4–10.5)
Chloride: 100 meq/L (ref 96–112)
Creatinine, Ser: 0.64 mg/dL (ref 0.40–1.20)
GFR: 95.25 mL/min (ref >60.00)
Glucose, Bld: 73 mg/dL (ref 70–99)
Potassium: 4.1 meq/L (ref 3.5–5.1)
Sodium: 141 meq/L (ref 135–145)
Total Bilirubin: 0.5 mg/dL (ref 0.2–1.2)
Total Protein: 7.7 g/dL (ref 6.0–8.3)

## 2024-06-13 LAB — LIPID PANEL
Cholesterol: 157 mg/dL (ref 0–200)
HDL: 50.8 mg/dL (ref >39.00)
LDL Cholesterol: 89 mg/dL (ref 0–99)
NonHDL: 106.56
Total CHOL/HDL Ratio: 3
Triglycerides: 87 mg/dL (ref 0.0–149.0)
VLDL: 17.4 mg/dL (ref 0.0–40.0)

## 2024-06-13 LAB — MICROALBUMIN / CREATININE URINE RATIO
Creatinine,U: 44.2 mg/dL
Microalb Creat Ratio: UNDETERMINED mg/g (ref 0.0–30.0)
Microalb, Ur: <0.7 mg/dL

## 2024-06-13 LAB — TSH: TSH: 1.19 u[IU]/mL (ref 0.35–5.50)

## 2024-06-13 LAB — HEMOGLOBIN A1C: Hgb A1c MFr Bld: 7 % — ABNORMAL HIGH (ref 4.6–6.5)

## 2024-06-13 MED ORDER — CLOBETASOL PROPIONATE 0.05 % EX OINT
1.0000 | TOPICAL_OINTMENT | Freq: Two times a day (BID) | CUTANEOUS | 2 refills | Status: AC
  Filled 2024-06-13: qty 60, 30d supply, fill #0
  Filled 2024-09-25: qty 60, 30d supply, fill #1

## 2024-06-13 NOTE — Assessment & Plan Note (Signed)
 Encouraged DASH or MIND diet, decrease po intake and increase exercise as tolerated. Needs 7-8 hours of sleep nightly. Avoid trans fats, eat small, frequent meals every 4-5 hours with lean proteins, complex carbs and healthy fats. Minimize simple carbs, high fat foods and processed foods

## 2024-06-14 ENCOUNTER — Other Ambulatory Visit (HOSPITAL_COMMUNITY): Payer: Self-pay

## 2024-06-14 ENCOUNTER — Ambulatory Visit: Payer: Self-pay | Admitting: Student

## 2024-06-17 ENCOUNTER — Ambulatory Visit: Admitting: Podiatry

## 2024-06-17 DIAGNOSIS — M722 Plantar fascial fibromatosis: Secondary | ICD-10-CM

## 2024-06-17 DIAGNOSIS — L600 Ingrowing nail: Secondary | ICD-10-CM

## 2024-06-17 NOTE — Progress Notes (Signed)
  Subjective:  Patient ID: Eileen Richards, female    DOB: [birth year],  MRN: [id number]  Chief Complaint  Patient presents with   Foot Pain    Bilateral foot pain plantar and heels.  R great toe pain localized under toe nail x 2 months pain 7 more of a burning. Diabetic A1c 7.0 81 mg [place]spin    Discussed the use of AI scribe software for clinical note transcription with the patient, who gave verbal consent to proceed.  History of Present Illness Eileen Richards is a 63 year old female who presents with pain in the big toe.  She experiences pain in the big toe, described as a 'nerve type thing' under the toenail. The sensation is unclear whether it is more of a sting or a hurt. The pain has been present since she canceled her last appointment. There are no recent injuries to the toe.  The pain is localized to the big toe and does not radiate. The toenail is thicker than her other toenails. She has not used urea cream before and has previously used treatments for fungal infections but not specifically for thick toenails.  Her heels still hurt, with no recent changes or treatments for this issue. No pain is present when pressure is applied to the toe, and there is no pain elsewhere on the foot.      Objective:  There were no vitals filed for this visit.  Physical Exam General: AAO x3, NAD  Dermatological: In general the nails are hypertrophic, dystrophic.  There is mild incurvation present on the right hallux nail border with localized tenderness on the nail border there is no drainage or pus or any signs of infection.  There are no open lesions.  Vascular: Dorsalis Pedis artery and Posterior Tibial artery pedal pulses are 2/4 bilateral with immedate capillary fill time. There is no pain with calf compression, swelling, warmth, erythema.   Neruologic: Grossly intact via light touch bilateral.   Musculoskeletal: Tenderness palpation of the ingrown toenail the right hallux.  There is no  signs of infection.  She does get some tenderness on the plantar aspect of calcaneus on insertion of the plantar fascia.  There is no pain with lateral compression of calcaneus.  There is no other areas of pinpoint tenderness.  MMT-was 5.  Gait: Unassisted, Nonantalgic.     No images are attached to the encounter.    Results LABS   Blood glucose: 7.0 mmol/L   Assessment:   1. Ingrown toenail   2. Plantar fasciitis      Plan:  Patient was evaluated and treated and all questions answered.  Assessment and Plan Assessment & Plan Ingrown toenail of right great toe Developing ingrown toenail causing stinging sensation and discomfort. - Symptoms likely consistent with ingrown toenail.  Discussed partial nail avulsion versus debridement.  Will start with debridement today which I sharply debrided the corner any complications or bleeding. - Provide pads and cushions for shoe pressure relief. - Consider toenail removal if conservative measures fail. - Recommend Epsom salt soaks for discomfort. - Prescribe urea cream twice daily to thin toenail.  Plantar heel pain Persistent heel pain, declined injection for relief. - Continue stretching exercises, icing, and wearing supportive shoes. - Offered steroid injection.  Type 2 diabetes mellitus without complications Blood sugar level at 7.0, requires better control.      Return in about 3 months (around 09/16/2024), or if symptoms worsen or fail to improve.   Donnice JONELLE Fees DPM

## 2024-06-24 ENCOUNTER — Other Ambulatory Visit (HOSPITAL_COMMUNITY): Payer: Self-pay

## 2024-06-24 ENCOUNTER — Other Ambulatory Visit: Payer: Self-pay

## 2024-06-27 ENCOUNTER — Other Ambulatory Visit (HOSPITAL_COMMUNITY): Payer: Self-pay

## 2024-07-02 ENCOUNTER — Other Ambulatory Visit: Payer: Self-pay | Admitting: Vascular Surgery

## 2024-07-02 DIAGNOSIS — I83891 Varicose veins of right lower extremities with other complications: Secondary | ICD-10-CM

## 2024-07-04 ENCOUNTER — Other Ambulatory Visit (HOSPITAL_COMMUNITY): Payer: Self-pay

## 2024-07-09 ENCOUNTER — Other Ambulatory Visit (HOSPITAL_COMMUNITY): Payer: Self-pay

## 2024-07-15 ENCOUNTER — Other Ambulatory Visit (HOSPITAL_COMMUNITY): Payer: Self-pay

## 2024-07-17 ENCOUNTER — Other Ambulatory Visit (HOSPITAL_COMMUNITY): Payer: Self-pay

## 2024-07-17 ENCOUNTER — Encounter (HOSPITAL_COMMUNITY): Payer: Self-pay

## 2024-07-17 ENCOUNTER — Other Ambulatory Visit: Payer: Self-pay | Admitting: Family Medicine

## 2024-07-17 DIAGNOSIS — E669 Obesity, unspecified: Secondary | ICD-10-CM

## 2024-07-17 MED ORDER — FREESTYLE LIBRE 3 PLUS SENSOR MISC
3 refills | Status: AC
  Filled 2024-07-17 – 2024-07-19 (×3): qty 2, 30d supply, fill #0
  Filled 2024-08-06 – 2024-08-12 (×5): qty 2, 30d supply, fill #1
  Filled 2024-09-25: qty 2, 30d supply, fill #2
  Filled 2024-10-22: qty 2, 30d supply, fill #3

## 2024-07-18 ENCOUNTER — Other Ambulatory Visit: Payer: Self-pay | Admitting: Family Medicine

## 2024-07-18 ENCOUNTER — Encounter: Payer: Self-pay | Admitting: Family Medicine

## 2024-07-18 ENCOUNTER — Encounter (HOSPITAL_COMMUNITY): Payer: Self-pay

## 2024-07-18 ENCOUNTER — Other Ambulatory Visit (HOSPITAL_COMMUNITY): Payer: Self-pay

## 2024-07-18 MED ORDER — FREESTYLE LIBRE 3 READER DEVI
0 refills | Status: AC
  Filled 2024-07-18: qty 1, 15d supply, fill #0

## 2024-07-19 ENCOUNTER — Other Ambulatory Visit (HOSPITAL_COMMUNITY): Payer: Self-pay

## 2024-07-24 NOTE — Progress Notes (Unsigned)
 Office Note     CC: Bleeding varicose veins Requesting Provider:  Domenica Harlene LABOR, MD  HPI: Eileen Richards is a 63 y.o. (08/25/1961) female who presents at the request of Domenica Harlene LABOR, MD for evaluation of bleeding varicose veins  On exam, Kaylena was doing well.  A native of [place], she moved to [place]  as a child, coming back to [place] as a young adult.  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  1 month ago she had an episode of spontaneous bleeding at the site of superficial varicosities on the right thigh.  She went to the hospital, and pressure was applied, which stopped the bleeding.  Prior to this, she has not had a bleeding episode.  She has not had a bleeding episode since, but is very worried that another will occur due to the thin skin above the lesion.  Denies symptoms of claudication, ischemic rest pain, tissue loss.  Notes some pain in the feet due to longstanding diabetes and chronic changes associated. Denies significant leg heaviness, edema, swelling worse by days end.   Past Medical History:  Diagnosis Date   Achilles tendonitis 10/14/2012   Asthma    childhood   Atypical chest pain    normal stress echo 02/2009 -LV size was normal LV global systolic function was normal- Normal wall motion; no LV regional wall motion abnormalities   Benign paroxysmal positional vertigo 04/26/2015   Chest pain, atypical 08/24/2013   Diabetes mellitus    GERD (gastroesophageal reflux disease)    Hand pain, left 11/10/2015   Hyperlipemia    Hypertension    Neuropathic pain of ankle, left 04/27/2017   Onychomycosis 04/17/2014   Pain of right heel 10/14/2012   Preventative health care 11/22/2015   Sinusitis, acute 01/17/2016   Thoracic outlet syndrome 11/03/2014   Urinary urgency 12/15/2015    Past Surgical History:  Procedure Laterality Date   ABDOMINAL HYSTERECTOMY     CYSTOSCOPY  07/23/2012   Procedure: CYSTOSCOPY;  Surgeon: Dickie LABOR Carder, MD;  Location: [facility];  Service: Gynecology;  Laterality: N/A;   DILATION AND CURETTAGE OF UTERUS     Lysis of Adhesions   ROBOTIC ASSISTED LAPAROSCOPIC LYSIS OF ADHESION  07/23/2012   Procedure: ROBOTIC ASSISTED LAPAROSCOPIC LYSIS OF ADHESION;  Surgeon: Dickie LABOR Carder, MD;  Location: [facility];  Service: Gynecology;  Laterality: N/A;   ROBOTIC ASSISTED SALPINGO OOPHERECTOMY  07/23/2012   Procedure: ROBOTIC ASSISTED SALPINGO OOPHERECTOMY;  Surgeon: Dickie LABOR Carder, MD;  Location: [facility];  Service: Gynecology;  Laterality: Bilateral;    Social History   Socioeconomic History   Marital status: Single    Spouse name: Not on file   Number of children: Not on file   Years of education: Not on file   Highest education level: Not on file  Occupational History   Not on file  Tobacco Use   Smoking status: Former    Current packs/day: 0.00    Types: Cigarettes    Quit date: 02/17/2014    Years since quitting: 10.4   Smokeless tobacco: Never  Vaping Use   Vaping status: Never Used  Substance and Sexual Activity   Alcohol use: No   Drug use: No   Sexual activity: Not on file  Other Topics Concern   Not on file  Social History Narrative   Not on file   Social Drivers of Health   Financial Resource Strain: Not on file  Food  Insecurity: Not on file  Transportation Needs: Not on file  Physical Activity: Not on file  Stress: Not on file  Social Connections: Not on file  Intimate Partner Violence: Not on file   Family History  Problem Relation Age of Onset   GER disease Mother    GER disease Father    Heart disease Father        father has heart problems   Anemia Father    Dementia Maternal Aunt    Miscarriages / Stillbirths Maternal Uncle    Depression Paternal Grandmother    Asthma Other    Hypertension Other    Diabetes Other     Current Outpatient Medications  Medication Sig Dispense Refill   albuterol  (VENTOLIN  HFA) 108 (90 Base) MCG/ACT inhaler  Inhale 2 puffs into the lungs every 6 (six) hours as needed for wheezing or shortness of breath. 6.7 g 5   aspirin 81 MG tablet Take 81 mg by mouth daily.     Blood Pressure Monitoring (OMRON 3 SERIES BP MONITOR) DEVI Use as directed 1 each 0   carvedilol  (COREG ) 3.125 MG tablet Take 1 tablet (3.125 mg total) by mouth 2 (two) times daily. 180 tablet 1   cetirizine  (ZYRTEC ) 10 MG tablet TAKE 1 TABLET BY MOUTH 2 TIMES DAILY. (Patient taking differently: Take 10 mg by mouth 2 (two) times daily.) 200 tablet 1   clobetasol  ointment (TEMOVATE ) 0.05 % Apply 1 Application topically 2 (two) times daily. 60 g 2   Continuous Blood Gluc Receiver (FREESTYLE LIBRE 2 READER) DEVI USE AS DIRECTED. (Patient taking differently: 1 each by Other route See admin instructions. Glucose reading) 1 each 1   Continuous Glucose Receiver (FREESTYLE LIBRE 3 READER) DEVI Check blood sugars as directed. 1 each 0   Continuous Glucose Sensor (FREESTYLE LIBRE 3 PLUS SENSOR) MISC Change sensor every 15 days. 2 each 3   cycloSPORINE  (RESTASIS ) 0.05 % ophthalmic emulsion Instill 1 drop into both eyes twice a day 180 each 3   diclofenac  Sodium (VOLTAREN ) 1 % GEL Apply 2 g topically 4 (four) times daily. Rub into affected area of foot 2 to 4 times daily (Patient taking differently: Apply 2 g topically as needed (Joint pain). Rub into affected area of foot 2 to 4 times daily) 100 g 2   famotidine  (PEPCID ) 20 MG tablet Take 1 tablet (20 mg total) by mouth 2 (two) times daily. 180 tablet 1   fluticasone  (FLOVENT  HFA) 110 MCG/ACT inhaler Inhale 1 puff into the lungs 2 (two) times daily. 12 g 5   gabapentin  (NEURONTIN ) 300 MG capsule Take 1-2 capsules (300-600 mg total) by mouth 3 (three) times daily. 180 capsule 3   hydrochlorothiazide  (HYDRODIURIL ) 25 MG tablet Take 1 tablet (25 mg total) by mouth daily. 90 tablet 1   insulin  glargine-yfgn (SEMGLEE , YFGN,) 100 UNIT/ML Pen Inject 48 Units into the skin every morning. 60 mL 1   Insulin  Pen  Needle (TECHLITE PEN NEEDLES) 32G X 4 MM MISC Use once daily 100 each 3   Iron -FA-B Cmp-C-Biot-Probiotic (FUSION PLUS) CAPS Take 1 capsule by mouth daily. 30 capsule 11   meloxicam  (MOBIC ) 7.5 MG tablet Take 1-2 tablets (7.5-15 mg total) by mouth daily. 60 tablet 2   metFORMIN  (GLUCOPHAGE -XR) 500 MG 24 hr tablet Take 4 tablets (2,000 mg total) by mouth daily. 360 tablet 0   nitroGLYCERIN  (NITROSTAT ) 0.4 MG SL tablet Place 1 tablet (0.4 mg total) under the tongue every 5 (five) minutes x 3 doses as  needed for chest pain. 40 tablet 1   pantoprazole  (PROTONIX ) 40 MG tablet Take 1 tablet (40 mg total) by mouth daily. 90 tablet 1   potassium chloride  SA (KLOR-CON  M) 20 MEQ tablet Take 1 tablet (20 mEq total) by mouth daily. 90 tablet 0   promethazine  (PHENERGAN ) 25 MG tablet Take 1 tablet (25 mg total) by mouth every 8 (eight) hours as needed for nausea or vomiting. 20 tablet 0   rosuvastatin  (CRESTOR ) 40 MG tablet Take 1 tablet (40 mg total) by mouth daily at bedtime 90 tablet 1   tirzepatide  (MOUNJARO ) 12.5 MG/0.5ML Pen Inject 12.5 mg into the skin once a week. 6 mL 2   tiZANidine  (ZANAFLEX ) 2 MG tablet Take 1/2 - 2 tablets (1-4 mg total) by mouth 2 (two) times daily as needed for muscle spasms. 30 tablet 1   traMADol  (ULTRAM ) 50 MG tablet Take 1 tablet (50 mg total) by mouth 3 (three) times daily as needed. 90 tablet 0   No current facility-administered medications for this visit.    Allergies  Allergen Reactions   Farxiga  [Dapagliflozin ] Hives     REVIEW OF SYSTEMS:  [X]  denotes positive finding, [ ]  denotes negative finding Cardiac  Comments:  Chest pain or chest pressure:    Shortness of breath upon exertion:    Short of breath when lying flat:    Irregular heart rhythm:        Vascular    Pain in calf, thigh, or hip brought on by ambulation:    Pain in feet at night that wakes you up from your sleep:     Blood clot in your veins:    Leg swelling:         Pulmonary    Oxygen at  home:    Productive cough:     Wheezing:         Neurologic    Sudden weakness in arms or legs:     Sudden numbness in arms or legs:     Sudden onset of difficulty speaking or slurred speech:    Temporary loss of vision in one eye:     Problems with dizziness:         Gastrointestinal    Blood in stool:     Vomited blood:         Genitourinary    Burning when urinating:     Blood in urine:        Psychiatric    Major depression:         Hematologic    Bleeding problems:    Problems with blood clotting too easily:        Skin    Rashes or ulcers:        Constitutional    Fever or chills:      PHYSICAL EXAMINATION:  There were no vitals filed for this visit.  General:  WDWN in NAD; vital signs documented above Gait: Not observed HENT: WNL, normocephalic Pulmonary: normal non-labored breathing , without Rales, rhonchi,  wheezing Cardiac: regular HR Abdomen: soft, NT, no masses Skin: without rashes Vascular Exam/Pulses:  Right Left  Radial 2+ (normal) 2+ (normal)  Ulnar    Femoral    Popliteal    DP 2+ (normal) 2+ (normal)  PT     Extremities: without ischemic changes, without Gangrene , without cellulitis; without open wounds;   Musculoskeletal: no muscle wasting or atrophy  Neurologic: A&O X 3;  No focal weakness or paresthesias are detected  Psychiatric:  The pt has Normal affect.   Non-Invasive Vascular Imaging:     Venous Reflux Times  +--------------------+---------+------+----------+------------+------------  ----+  RIGHT              Reflux NoReflux  Reflux  Diameter cmsComments                                         Yes     Time                                  +--------------------+---------+------+----------+------------+------------  ----+  CFV                no                                                      +--------------------+---------+------+----------+------------+------------  ----+  FV mid               no                                                      +--------------------+---------+------+----------+------------+------------  ----+  Popliteal          no                                                      +--------------------+---------+------+----------+------------+------------  ----+  GSV at SFJ          no                           .75                        +--------------------+---------+------+----------+------------+------------  ----+  GSV prox thigh      no                           .37                        +--------------------+---------+------+----------+------------+------------  ----+  GSV mid thigh       no                           .31                        +--------------------+---------+------+----------+------------+------------  ----+  GSV dist thigh      no                           .33                        +--------------------+---------+------+----------+------------+------------  ----+  GSV at knee         no                           .27                        +--------------------+---------+------+----------+------------+------------  ----+  GSV prox calf       no                           .26                        +--------------------+---------+------+----------+------------+------------  ----+  GSV mid calf                  yes   >500 ms      .26                        +--------------------+---------+------+----------+------------+------------  ----+  GSV dist calf                 yes   >500 ms      .22                        +--------------------+---------+------+----------+------------+------------  ----+  Giacomini          no                           .29                        +--------------------+---------+------+----------+------------+------------  ----+  SSV prox calf       no                           .14                         +--------------------+---------+------+----------+------------+------------  ----+  SSV mid calf        no                           .18                        +--------------------+---------+------+----------+------------+------------  ----+  AASV prox           no                           .68                        +--------------------+---------+------+----------+------------+------------  ----+  Varicosities       no                         .20/.23                      proximal posterior  calf                                                                        +--------------------+---------+------+----------+------------+------------  ----+  Varicosities                 yes   >500 ms    .28/.19   area of            anterior mid thigh                                        concern/bleed     +--------------------+---------+------+----------+------------+------------  ----+      ASSESSMENT/PLAN:: 63 y.o. female presenting with spontaneous bleeding from right thigh varicosities.  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  I had a long conversation with Melana regarding the above, most notably that I do not want her to have another bleeding episode.  She does not have any greater saphenous vein with significant reflux, and therefore would not benefit from laser ablation.  I do not think that the cluster varicosity would do well with scaphoidectomy either.  I plan to have our sclerotherapy nurse reach out to her in the coming days to discuss options and candidacy as well as insurance authorization.  We discussed that if another bleeding episode occurs, that focal pressure to the lesion will stop any venous bleeding.  She was asked to call my office should any questions arise and call 911 should the bleeding occur and be  unmanageable.    Fonda FORBES Rim, MD [facility] [phone]

## 2024-07-25 ENCOUNTER — Encounter: Payer: Self-pay | Admitting: Vascular Surgery

## 2024-07-25 ENCOUNTER — Ambulatory Visit (HOSPITAL_COMMUNITY)
Admission: RE | Admit: 2024-07-25 | Discharge: 2024-07-25 | Disposition: A | Discharge status: Home / Self Care | Source: Ambulatory Visit | Attending: Vascular Surgery | Admitting: Vascular Surgery

## 2024-07-25 ENCOUNTER — Ambulatory Visit (INDEPENDENT_AMBULATORY_CARE_PROVIDER_SITE_OTHER): Admitting: Vascular Surgery

## 2024-07-25 VITALS — BP 134/76 | HR 77 | Temp 97.9°F | Resp 18 | Ht 64.0 in | Wt 223.6 lb

## 2024-07-25 DIAGNOSIS — I83891 Varicose veins of right lower extremities with other complications: Secondary | ICD-10-CM | POA: Diagnosis not present

## 2024-07-28 ENCOUNTER — Other Ambulatory Visit: Payer: Self-pay | Admitting: Family Medicine

## 2024-07-29 ENCOUNTER — Other Ambulatory Visit (HOSPITAL_COMMUNITY): Payer: Self-pay

## 2024-07-29 ENCOUNTER — Telehealth: Payer: Self-pay

## 2024-07-29 ENCOUNTER — Other Ambulatory Visit: Payer: Self-pay

## 2024-07-29 MED ORDER — MOUNJARO 12.5 MG/0.5ML ~~LOC~~ SOAJ
12.5000 mg | SUBCUTANEOUS | 2 refills | Status: AC
  Filled 2024-07-29: qty 6, 84d supply, fill #0

## 2024-07-29 NOTE — Telephone Encounter (Signed)
 Pt had bleeding episode from vein and is interested in sclerotherapy. I called her insurance to pre-cert and they do not require pre-certification. Pt is aware but would like to wait until she has time off of work next February. She is aware we will have to get her new insurance card to see if pre-cert is required. She has been advised to call us  in early January to start this process. No further questions/concerns at this time.

## 2024-08-06 ENCOUNTER — Other Ambulatory Visit (HOSPITAL_COMMUNITY): Payer: Self-pay

## 2024-08-07 ENCOUNTER — Other Ambulatory Visit (HOSPITAL_COMMUNITY): Payer: Self-pay

## 2024-08-09 ENCOUNTER — Other Ambulatory Visit (HOSPITAL_COMMUNITY): Payer: Self-pay

## 2024-08-10 ENCOUNTER — Other Ambulatory Visit: Payer: Self-pay | Admitting: Family Medicine

## 2024-08-12 ENCOUNTER — Other Ambulatory Visit (HOSPITAL_COMMUNITY): Payer: Self-pay

## 2024-08-12 ENCOUNTER — Other Ambulatory Visit: Payer: Self-pay

## 2024-08-12 MED ORDER — METFORMIN HCL ER 500 MG PO TB24
2000.0000 mg | ORAL_TABLET | Freq: Every day | ORAL | 1 refills | Status: AC
  Filled 2024-08-12: qty 360, 90d supply, fill #0

## 2024-08-12 MED ORDER — POTASSIUM CHLORIDE CRYS ER 20 MEQ PO TBCR
20.0000 meq | EXTENDED_RELEASE_TABLET | Freq: Every day | ORAL | 1 refills | Status: AC
  Filled 2024-08-12: qty 90, 90d supply, fill #0

## 2024-09-20 ENCOUNTER — Telehealth: Payer: Self-pay

## 2024-09-20 NOTE — Telephone Encounter (Signed)
 Pt was advised that order was canceled out due to expiring soon and already completed.

## 2024-09-20 NOTE — Telephone Encounter (Signed)
 Copied from CRM #[id number]. Topic: Clinical - Request for Lab/Test Order >> Sep 20, 2024 10:27 AM Wyona P wrote: Reason for CRM: Pt called in regarding lab work orders inmy chart with reference numbers  [id number] & [id number]  Procedure: Comprehensive metabolic panel Status: Needs Scheduling Requested appt date: 12/14/2023 Authorizing: Domenica Harlene LABOR, MD in [facility] Expires: 10/01/2024      Pt would like to know if this needs to be done and would like for someone to follow up .

## 2024-09-25 ENCOUNTER — Other Ambulatory Visit (HOSPITAL_COMMUNITY): Payer: Self-pay

## 2024-10-16 ENCOUNTER — Encounter (HOSPITAL_COMMUNITY): Payer: Self-pay

## 2024-10-16 ENCOUNTER — Other Ambulatory Visit (HOSPITAL_COMMUNITY): Payer: Self-pay

## 2024-10-16 ENCOUNTER — Telehealth: Payer: Self-pay | Admitting: Family Medicine

## 2024-10-16 ENCOUNTER — Other Ambulatory Visit: Payer: Self-pay | Admitting: Student

## 2024-10-16 ENCOUNTER — Other Ambulatory Visit: Payer: Self-pay | Admitting: Family

## 2024-10-16 ENCOUNTER — Other Ambulatory Visit: Payer: Self-pay

## 2024-10-16 ENCOUNTER — Other Ambulatory Visit: Payer: Self-pay | Admitting: Family Medicine

## 2024-10-16 DIAGNOSIS — E782 Mixed hyperlipidemia: Secondary | ICD-10-CM

## 2024-10-16 MED ORDER — INSULIN GLARGINE-YFGN 100 UNIT/ML ~~LOC~~ SOPN
48.0000 [IU] | PEN_INJECTOR | SUBCUTANEOUS | 1 refills | Status: AC
  Filled 2024-10-16: qty 15, 31d supply, fill #0

## 2024-10-16 MED ORDER — ALBUTEROL SULFATE HFA 108 (90 BASE) MCG/ACT IN AERS
2.0000 | INHALATION_SPRAY | Freq: Four times a day (QID) | RESPIRATORY_TRACT | 5 refills | Status: AC | PRN
  Filled 2024-10-16: qty 6.7, 25d supply, fill #0

## 2024-10-16 MED ORDER — HYDROCHLOROTHIAZIDE 25 MG PO TABS
25.0000 mg | ORAL_TABLET | Freq: Every day | ORAL | 1 refills | Status: AC
  Filled 2024-10-16: qty 90, 90d supply, fill #0

## 2024-10-16 MED ORDER — MELOXICAM 7.5 MG PO TABS
7.5000 mg | ORAL_TABLET | Freq: Every day | ORAL | 2 refills | Status: AC
  Filled 2024-10-16: qty 60, 30d supply, fill #0

## 2024-10-16 MED ORDER — ROSUVASTATIN CALCIUM 40 MG PO TABS
40.0000 mg | ORAL_TABLET | Freq: Every day | ORAL | 1 refills | Status: AC
  Filled 2024-10-16: qty 90, 90d supply, fill #0

## 2024-10-16 NOTE — Telephone Encounter (Signed)
 Patient was calling to see if [employer] paperwork was received today,10/16/2024 and she was advised paperwork received.

## 2024-10-16 NOTE — Telephone Encounter (Signed)
 Copied from CRM #[id number]. Topic: General - Other >> Oct 16, 2024  2:24 PM Roselie BROCKS wrote: Reason for CRM: Patient calling for updated status on FMLA papers that is to be faxed in.  Return call to patient concerning this

## 2024-10-19 ENCOUNTER — Other Ambulatory Visit: Payer: Self-pay | Admitting: Family Medicine

## 2024-10-21 ENCOUNTER — Other Ambulatory Visit (HOSPITAL_COMMUNITY): Payer: Self-pay

## 2024-10-21 MED ORDER — FLUTICASONE PROPIONATE HFA 110 MCG/ACT IN AERO
1.0000 | INHALATION_SPRAY | Freq: Two times a day (BID) | RESPIRATORY_TRACT | 5 refills | Status: AC
  Filled 2024-10-21: qty 12, 60d supply, fill #0

## 2024-10-22 ENCOUNTER — Other Ambulatory Visit: Payer: Self-pay

## 2024-10-22 ENCOUNTER — Other Ambulatory Visit (HOSPITAL_COMMUNITY): Payer: Self-pay

## 2024-11-21 ENCOUNTER — Ambulatory Visit: Admitting: Family Medicine

## 2024-12-13 ENCOUNTER — Encounter: Admitting: Family Medicine
